# Patient Record
Sex: Male | Born: 1950
Health system: Southern US, Community
[De-identification: ages and names within clinical notes are randomized; demographics above are authoritative.]

## PROBLEM LIST (undated history)

## (undated) DIAGNOSIS — M51369 Other intervertebral disc degeneration, lumbar region without mention of lumbar back pain or lower extremity pain: Secondary | ICD-10-CM

## (undated) DIAGNOSIS — I4510 Unspecified right bundle-branch block: Secondary | ICD-10-CM

## (undated) DIAGNOSIS — I480 Paroxysmal atrial fibrillation: Secondary | ICD-10-CM

## (undated) DIAGNOSIS — I5040 Unspecified combined systolic (congestive) and diastolic (congestive) heart failure: Secondary | ICD-10-CM

## (undated) DIAGNOSIS — N529 Male erectile dysfunction, unspecified: Secondary | ICD-10-CM

## (undated) DIAGNOSIS — Z8673 Personal history of transient ischemic attack (TIA), and cerebral infarction without residual deficits: Secondary | ICD-10-CM

## (undated) DIAGNOSIS — E785 Hyperlipidemia, unspecified: Secondary | ICD-10-CM

## (undated) DIAGNOSIS — Z9889 Other specified postprocedural states: Secondary | ICD-10-CM

## (undated) DIAGNOSIS — I2510 Atherosclerotic heart disease of native coronary artery without angina pectoris: Secondary | ICD-10-CM

## (undated) DIAGNOSIS — I639 Cerebral infarction, unspecified: Secondary | ICD-10-CM

## (undated) DIAGNOSIS — Z7901 Long term (current) use of anticoagulants: Secondary | ICD-10-CM

## (undated) DIAGNOSIS — I495 Sick sinus syndrome: Secondary | ICD-10-CM

## (undated) DIAGNOSIS — I700 Atherosclerosis of aorta: Secondary | ICD-10-CM

## (undated) DIAGNOSIS — I629 Nontraumatic intracranial hemorrhage, unspecified: Secondary | ICD-10-CM

## (undated) DIAGNOSIS — R7303 Prediabetes: Secondary | ICD-10-CM

## (undated) DIAGNOSIS — N400 Enlarged prostate without lower urinary tract symptoms: Secondary | ICD-10-CM

## (undated) DIAGNOSIS — Z950 Presence of cardiac pacemaker: Secondary | ICD-10-CM

## (undated) DIAGNOSIS — G4733 Obstructive sleep apnea (adult) (pediatric): Secondary | ICD-10-CM

## (undated) DIAGNOSIS — C44621 Squamous cell carcinoma of skin of unspecified upper limb, including shoulder: Secondary | ICD-10-CM

## (undated) DIAGNOSIS — D696 Thrombocytopenia, unspecified: Secondary | ICD-10-CM

## (undated) DIAGNOSIS — R55 Syncope and collapse: Secondary | ICD-10-CM

## (undated) DIAGNOSIS — I10 Essential (primary) hypertension: Secondary | ICD-10-CM

## (undated) DIAGNOSIS — I519 Heart disease, unspecified: Secondary | ICD-10-CM

## (undated) DIAGNOSIS — M5030 Other cervical disc degeneration, unspecified cervical region: Secondary | ICD-10-CM

## (undated) DIAGNOSIS — A419 Sepsis, unspecified organism: Secondary | ICD-10-CM

## (undated) DIAGNOSIS — N183 Chronic kidney disease, stage 3 unspecified: Secondary | ICD-10-CM

## (undated) DIAGNOSIS — I509 Heart failure, unspecified: Secondary | ICD-10-CM

## (undated) DIAGNOSIS — I6789 Other cerebrovascular disease: Secondary | ICD-10-CM

## (undated) DIAGNOSIS — I7121 Aneurysm of the ascending aorta, without rupture: Secondary | ICD-10-CM

## (undated) DIAGNOSIS — M47819 Spondylosis without myelopathy or radiculopathy, site unspecified: Secondary | ICD-10-CM

## (undated) DIAGNOSIS — G5732 Lesion of lateral popliteal nerve, left lower limb: Secondary | ICD-10-CM

## (undated) DIAGNOSIS — I499 Cardiac arrhythmia, unspecified: Secondary | ICD-10-CM

## (undated) DIAGNOSIS — R931 Abnormal findings on diagnostic imaging of heart and coronary circulation: Secondary | ICD-10-CM

## (undated) DIAGNOSIS — I7143 Infrarenal abdominal aortic aneurysm, without rupture: Secondary | ICD-10-CM

## (undated) DIAGNOSIS — M1990 Unspecified osteoarthritis, unspecified site: Secondary | ICD-10-CM

## (undated) DIAGNOSIS — G459 Transient cerebral ischemic attack, unspecified: Secondary | ICD-10-CM

## (undated) DIAGNOSIS — I719 Aortic aneurysm of unspecified site, without rupture: Secondary | ICD-10-CM

## (undated) HISTORY — DX: Abnormal findings on diagnostic imaging of heart and coronary circulation: R93.1

## (undated) HISTORY — DX: Obstructive sleep apnea (adult) (pediatric): G47.33

## (undated) HISTORY — PX: LOOP RECORDER REMOVAL: EP1215

## (undated) HISTORY — PX: ELECTROCARDIOGRAM: SHX264

## (undated) HISTORY — DX: Paroxysmal atrial fibrillation: I48.0

## (undated) HISTORY — PX: ANTERIOR CERVICAL DECOMP/DISCECTOMY FUSION: SHX1161

## (undated) HISTORY — PX: WISDOM TOOTH EXTRACTION: SHX21

## (undated) HISTORY — DX: Sick sinus syndrome: I49.5

## (undated) HISTORY — DX: Syncope and collapse: R55

## (undated) HISTORY — DX: Transient cerebral ischemic attack, unspecified: G45.9

## (undated) HISTORY — DX: Hyperlipidemia, unspecified: E78.5

## (undated) HISTORY — PX: COLONOSCOPY: SHX174

## (undated) HISTORY — PX: NECK SURGERY: SHX720

---

## 2001-03-27 ENCOUNTER — Encounter: Payer: Self-pay | Admitting: Internal Medicine

## 2001-03-27 ENCOUNTER — Ambulatory Visit (HOSPITAL_COMMUNITY): Admission: RE | Admit: 2001-03-27 | Discharge: 2001-03-27 | Payer: Self-pay | Admitting: Internal Medicine

## 2001-07-16 ENCOUNTER — Observation Stay (HOSPITAL_COMMUNITY): Admission: RE | Admit: 2001-07-16 | Discharge: 2001-07-17 | Payer: Self-pay | Admitting: Neurosurgery

## 2001-08-13 ENCOUNTER — Encounter: Admission: RE | Admit: 2001-08-13 | Discharge: 2001-08-13 | Payer: Self-pay | Admitting: Neurosurgery

## 2001-11-12 ENCOUNTER — Encounter (HOSPITAL_COMMUNITY): Admission: RE | Admit: 2001-11-12 | Discharge: 2001-12-12 | Payer: Self-pay | Admitting: Neurosurgery

## 2001-12-13 ENCOUNTER — Encounter (HOSPITAL_COMMUNITY): Admission: RE | Admit: 2001-12-13 | Discharge: 2002-01-12 | Payer: Self-pay | Admitting: Neurosurgery

## 2003-10-08 ENCOUNTER — Emergency Department (HOSPITAL_COMMUNITY): Admission: EM | Admit: 2003-10-08 | Discharge: 2003-10-08 | Payer: Self-pay | Admitting: Internal Medicine

## 2005-02-17 ENCOUNTER — Ambulatory Visit (HOSPITAL_COMMUNITY): Admission: RE | Admit: 2005-02-17 | Discharge: 2005-02-17 | Payer: Self-pay | Admitting: Family Medicine

## 2005-02-22 ENCOUNTER — Ambulatory Visit: Payer: Self-pay | Admitting: Cardiology

## 2006-03-10 DIAGNOSIS — G459 Transient cerebral ischemic attack, unspecified: Secondary | ICD-10-CM

## 2006-03-10 HISTORY — DX: Transient cerebral ischemic attack, unspecified: G45.9

## 2006-03-27 ENCOUNTER — Ambulatory Visit (HOSPITAL_COMMUNITY): Admission: RE | Admit: 2006-03-27 | Discharge: 2006-03-27 | Payer: Self-pay | Admitting: General Surgery

## 2006-04-05 ENCOUNTER — Encounter: Payer: Self-pay | Admitting: Emergency Medicine

## 2006-04-05 ENCOUNTER — Ambulatory Visit: Payer: Self-pay | Admitting: Internal Medicine

## 2006-04-05 ENCOUNTER — Encounter (INDEPENDENT_AMBULATORY_CARE_PROVIDER_SITE_OTHER): Payer: Self-pay | Admitting: Neurology

## 2006-04-05 ENCOUNTER — Encounter: Payer: Self-pay | Admitting: Internal Medicine

## 2006-04-05 ENCOUNTER — Inpatient Hospital Stay (HOSPITAL_COMMUNITY): Admission: RE | Admit: 2006-04-05 | Discharge: 2006-04-07 | Payer: Self-pay | Admitting: Neurology

## 2009-03-18 ENCOUNTER — Emergency Department (HOSPITAL_COMMUNITY): Admission: EM | Admit: 2009-03-18 | Discharge: 2009-03-18 | Payer: Self-pay | Admitting: Emergency Medicine

## 2009-04-10 ENCOUNTER — Ambulatory Visit (HOSPITAL_COMMUNITY): Admission: RE | Admit: 2009-04-10 | Discharge: 2009-04-10 | Payer: Self-pay | Admitting: Family Medicine

## 2009-04-22 ENCOUNTER — Ambulatory Visit (HOSPITAL_COMMUNITY): Admission: RE | Admit: 2009-04-22 | Discharge: 2009-04-22 | Payer: Self-pay | Admitting: Family Medicine

## 2009-08-11 ENCOUNTER — Ambulatory Visit (HOSPITAL_COMMUNITY): Admission: RE | Admit: 2009-08-11 | Discharge: 2009-08-11 | Payer: Self-pay | Admitting: Pulmonary Disease

## 2009-11-19 ENCOUNTER — Ambulatory Visit (HOSPITAL_COMMUNITY): Admission: RE | Admit: 2009-11-19 | Discharge: 2009-11-19 | Payer: Self-pay | Admitting: Family Medicine

## 2010-07-16 ENCOUNTER — Ambulatory Visit (HOSPITAL_COMMUNITY): Admission: RE | Admit: 2010-07-16 | Discharge: 2010-07-16 | Payer: Self-pay | Admitting: Pulmonary Disease

## 2010-09-26 ENCOUNTER — Observation Stay (HOSPITAL_COMMUNITY): Admission: EM | Admit: 2010-09-26 | Discharge: 2010-09-27 | Payer: Self-pay | Source: Home / Self Care

## 2010-12-20 LAB — BASIC METABOLIC PANEL
BUN: 16 mg/dL (ref 6–23)
CO2: 30 mEq/L (ref 19–32)
CO2: 32 mEq/L (ref 19–32)
Calcium: 9.3 mg/dL (ref 8.4–10.5)
Calcium: 9.7 mg/dL (ref 8.4–10.5)
Chloride: 103 mEq/L (ref 96–112)
Chloride: 106 mEq/L (ref 96–112)
GFR calc Af Amer: >60 mL/min (ref >60)
GFR calc Af Amer: >60 mL/min (ref >60)
GFR calc non Af Amer: >60 mL/min (ref >60)
Glucose, Bld: 86 mg/dL (ref 70–99)
Glucose, Bld: 93 mg/dL (ref 70–99)
Potassium: 3.5 mEq/L (ref 3.5–5.1)
Potassium: 3.6 mEq/L (ref 3.5–5.1)
Sodium: 141 mEq/L (ref 135–145)

## 2010-12-20 LAB — CARDIAC PANEL(CRET KIN+CKTOT+MB+TROPI)
Relative Index: INVALID (ref 0.0–2.5)
Relative Index: INVALID (ref 0.0–2.5)
Total CK: 65 U/L (ref 7–232)
Troponin I: <0.01 ng/mL (ref 0.00–0.06)

## 2010-12-20 LAB — CBC
HCT: 42.4 % (ref 39.0–52.0)
Hemoglobin: 15 g/dL (ref 13.0–17.0)
MCH: 31.8 pg (ref 26.0–34.0)
MCHC: 35.4 g/dL (ref 30.0–36.0)
MCV: 90 fL (ref 78.0–100.0)
MCV: 90 fL (ref 78.0–100.0)
RBC: 4.71 MIL/uL (ref 4.22–5.81)
WBC: 8.1 10*3/uL (ref 4.0–10.5)

## 2010-12-20 LAB — DIFFERENTIAL
Basophils Absolute: 0 10*3/uL (ref 0.0–0.1)
Basophils Relative: 0 % (ref 0–1)
Basophils Relative: 0 % (ref 0–1)
Eosinophils Absolute: 0.1 10*3/uL (ref 0.0–0.7)
Eosinophils Absolute: 0.1 10*3/uL (ref 0.0–0.7)
Eosinophils Relative: 1 % (ref 0–5)
Lymphocytes Relative: 23 % (ref 12–46)
Lymphs Abs: 1.9 10*3/uL (ref 0.7–4.0)
Monocytes Absolute: 0.9 10*3/uL (ref 0.1–1.0)
Monocytes Absolute: 0.9 10*3/uL (ref 0.1–1.0)
Monocytes Relative: 10 % (ref 3–12)
Monocytes Relative: 11 % (ref 3–12)
Neutrophils Relative %: 64 % (ref 43–77)
Neutrophils Relative %: 66 % (ref 43–77)

## 2010-12-20 LAB — POCT CARDIAC MARKERS: Myoglobin, poc: 70.5 ng/mL (ref 12–200)

## 2010-12-20 LAB — CK TOTAL AND CKMB (NOT AT ARMC)
CK, MB: 2.1 ng/mL (ref 0.3–4.0)
Total CK: 79 U/L (ref 7–232)

## 2010-12-20 LAB — HEPATIC FUNCTION PANEL
AST: 25 U/L (ref 0–37)
Alkaline Phosphatase: 54 U/L (ref 39–117)
Indirect Bilirubin: 0.6 mg/dL (ref 0.3–0.9)
Total Protein: 7 g/dL (ref 6.0–8.3)

## 2010-12-20 LAB — TYPE AND SCREEN

## 2010-12-20 LAB — PROTIME-INR: Prothrombin Time: 22.8 seconds — ABNORMAL HIGH (ref 11.6–15.2)

## 2010-12-29 LAB — CREATININE, SERUM: Creatinine, Ser: 0.95 mg/dL (ref 0.4–1.5)

## 2011-01-17 LAB — URINALYSIS, ROUTINE W REFLEX MICROSCOPIC
Leukocytes, UA: NEGATIVE
Nitrite: NEGATIVE
Specific Gravity, Urine: 1.014 (ref 1.005–1.030)
Urobilinogen, UA: 2 mg/dL — ABNORMAL HIGH (ref 0.0–1.0)
pH: 6 (ref 5.0–8.0)

## 2011-01-17 LAB — CBC
HCT: 44.1 % (ref 39.0–52.0)
Hemoglobin: 15.2 g/dL (ref 13.0–17.0)
MCV: 93.8 fL (ref 78.0–100.0)
RDW: 12.3 % (ref 11.5–15.5)
WBC: 9 10*3/uL (ref 4.0–10.5)

## 2011-01-17 LAB — BASIC METABOLIC PANEL
BUN: 11 mg/dL (ref 6–23)
Chloride: 101 mEq/L (ref 96–112)
GFR calc non Af Amer: >60 mL/min (ref >60)
Glucose, Bld: 109 mg/dL — ABNORMAL HIGH (ref 70–99)
Potassium: 3.9 mEq/L (ref 3.5–5.1)
Sodium: 136 mEq/L (ref 135–145)

## 2011-01-17 LAB — DIFFERENTIAL
Eosinophils Absolute: 0 10*3/uL (ref 0.0–0.7)
Eosinophils Relative: 0 % (ref 0–5)
Lymphocytes Relative: 10 % — ABNORMAL LOW (ref 12–46)
Lymphs Abs: 0.9 10*3/uL (ref 0.7–4.0)
Monocytes Absolute: 1.2 10*3/uL — ABNORMAL HIGH (ref 0.1–1.0)

## 2011-01-17 LAB — URINE MICROSCOPIC-ADD ON

## 2011-01-17 LAB — URINE CULTURE: Culture: NO GROWTH

## 2011-02-02 ENCOUNTER — Ambulatory Visit (HOSPITAL_COMMUNITY)
Admission: RE | Admit: 2011-02-02 | Discharge: 2011-02-02 | Disposition: A | Discharge status: Home / Self Care | Payer: BC Managed Care – PPO | Source: Ambulatory Visit | Attending: Cardiology | Admitting: Cardiology

## 2011-02-02 DIAGNOSIS — I10 Essential (primary) hypertension: Secondary | ICD-10-CM | POA: Insufficient documentation

## 2011-02-02 DIAGNOSIS — R55 Syncope and collapse: Secondary | ICD-10-CM | POA: Insufficient documentation

## 2011-02-02 DIAGNOSIS — E785 Hyperlipidemia, unspecified: Secondary | ICD-10-CM | POA: Insufficient documentation

## 2011-02-02 HISTORY — PX: LOOP RECORDER IMPLANT: SHX5954

## 2011-02-12 NOTE — Consult Note (Signed)
Alex Maddox, Alex Maddox NO.:  [id number]  MEDICAL RECORD NO.:  [id number]          PATIENT TYPE:  INP  LOCATION:  A317                          FACILITY:  [facility]  PHYSICIAN:  Ritta Slot, MD     DATE OF BIRTH:  Dec 16, 1950  DATE OF CONSULTATION: DATE OF DISCHARGE:  09/27/2010                                CONSULTATION   Alex Maddox had a syncopal episode September 18, 2010, when he was at [employer], he had felt hot all over and then he passed out.  He also had episodes of palpitations intermittently.  Since December 10, he has had 4 episodes.  He had a CT scan and MRI of his brain that was normally. He subsequently underwent Exercise Nuclear Perfusion Stress Test in October 19, 2010 that showed ha has a low EF of 43%.  His event recorder from January did show evidence of what looks like short burst of AFib flutter with artifact, but is also concerned on deep inspiration of the pulses there is wide-complex tachycardia.  I am not convinced that this is VT, but it raises the suspicion of VT.  I do think that there was a lot of artifacts and it was difficult to ascertain significantly.  I do feel we need go ahead and proceed with loop recorder implantation.  Given his symptoms of dizziness, sweating, feeling hot with night sweats, I also do feel that we need to proceed with evaluation for 24-hour urine to rule out pheochromocytoma for metanephrines as well as TSH.  We will go ahead and set this up.  PAST MEDICAL HISTORY: 1. PAF. 2. Normal EF. 3. TIAs at the age of 56.  CURRENT MEDICATIONS: 1. Coumadin. 2. Coreg 12.5 mg b.i.d. 3. Diovan 160 mg daily. 4. Saw palmetto 450 mg daily. 5. Fish oil 1200 mg daily. 6. Simvastatin 40 mg at bedtime.  ALLERGIES:  NO KNOWN DRUG ALLERGIES.  SOCIAL HISTORY:  He just recently got married.  He has done well.  He does not drink alcohol.  He works as a Chartered certified accountant.  He does not smoke.  FAMILY HISTORY:  Very strong family history of  premature CAD.  REVIEW OF SYSTEMS:  He has had syncopal episodes more frequently.  PHYSICAL EXAMINATION:  GENERAL:  Well looking individual in acute distress. VITAL SIGNS:  He is 6 feet 1 inch, 192 pounds, blood pressure 132/78, heart rate 63 sinus rhythm. HEENT:  Head is atraumatic, normocephalic. NECK:  Supple.  Full range of movement.  No jugular venous distention, carotid bruits, or thyromegaly. NEUROLOGIC:  Cranial nerves II-XII normal.  PERRLA.  No focal deficits. MUSCULOSKELETAL:  Normal focal deficits. CARDIOVASCULAR:  No heaves or thrills.  Heart sounds 1 and 2 are heard. He had no murmurs, rubs, or gallops. LUNGS:  Clear to auscultation bilaterally.  Percussion at resonant throughout.  No crepitus or wheezing noted. EXTREMITIES:  2+ pedal pulses, no edema.  PLAN:  I am going to set him up for a loop recorder implantation this afternoon to evaluate for any malignant arrhythmias including VT.  I am worried that there is suspicion for this.  Although he does have a history of PAF, we need to figure out what exactly is causing his symptoms.  We will go ahead and set this up today.  We will also get  24- hour urine for catecholamines to exclude pheochromocytoma as well as a TSH.  We will set this all for him today for him.  I am forwarding to seeing him in a week's time for further evaluation.     Ritta Slot, MD     HS/MEDQ  D:  02/02/2011  T:  02/02/2011  Job:  [id number]  Electronically Signed by Ritta Slot MD on 02/12/2011 04:02:34 PM

## 2011-02-25 NOTE — Op Note (Signed)
[place]. [facility]  Patient:    Alex Maddox, Alex Maddox Visit Number: [id number] MRN: [id number]          Service Type: SUR Location: 3000 [id number] [id number] Attending Physician:  Cristi Loron Dictated by:   Cristi Loron, M.D. Proc. Date: 07/16/01 Admit Date:  07/16/2001 Discharge Date: 07/17/2001                             Operative Report  PREOPERATIVE DIAGNOSES:  C5-6 spondylosis and degenerative disk disease, spinal stenosis, cervicalgia, cervical radiculopathy.  POSTOPERATIVE DIAGNOSES:  C5-6 spondylosis and degenerative disk disease, spinal stenosis, cervicalgia, cervical radiculopathy.  PROCEDURES:  C5-6 extensive anterior cervical diskectomy, interbody iliac crest allograft arthrodesis, anterior cervical plating (Codman titanium plate and screws).  SURGEON:  Cristi Loron, M.D.  ASSISTANT:  Mena Goes. Franky Macho, M.D.  ANESTHESIA:  General endotracheal.  ESTIMATED BLOOD LOSS:  100 cc.  SPECIMENS:  None.  DRAINS:  None.  COMPLICATIONS:  None.  BRIEF HISTORY:  The patient is a 60 year old white male who has suffered from neck and arm pain.  He has failed medical management and worked up with a cervical MRI, which demonstrated spondylosis at C5-6.  He therefore weighed the risks, benefits, and alternatives of surgery and decided to proceed with a C5-6 anterior cervical diskectomy, fusion and plating.  DESCRIPTION OF PROCEDURE:  The patient was brought to the operating room by the anesthesia team.  General endotracheal anesthesia was induced.  The patient remained in supine position.  A roll was placed under his shoulders to place his neck in slight extension.  His anterior cervical region was then prepared with Betadine scrub and Betadine solution, and sterile drapes were applied.  I then injected the area to be incised with Marcaine with epinephrine solution.  I used a scalpel to make the transverse incision in the patients left anterior  neck.  I used the Metzenbaum scissors to divide the platysma muscle and then dissected medial to the sternocleidomastoid muscle, jugular vein, and carotid artery.  I bluntly dissected down toward the anterior cervical spine, carefully identifying the esophagus and then retracting it medially.  I cleared the soft tissue from the cervical spine using the Kitner swab.  I then inserted a bent spinal needle into the exposed interspace and obtained the intraoperative radiograph to confirm our location. I then used the 15 blade scalpel to incise the C5-6 intervertebral disk and performed a partial diskectomy at C5-6 using a pituitary forceps and a Karlin curette.  I then inserted distraction screws at C5 and C6, distracted the interspace, and then used the high-speed drill to decorticate the vertebral end plates at G3-8 and drill away the remainder of the C5-6 intervertebral disk.  There was a considerable spondylosis at this level.  I drilled away the bone spurs using the high-speed drill and then thinned out the posterior longitudinal ligament with the drill.  I incised the ligament with the arachnoid knife and then removed it with a Kerrison punch, undercutting the vertebral end plates at C5 and C6, decompressing the thecal sac.  I then performed a generous foraminotomy up the bilateral C6 nerve roots, removing some more bone spurs.  Having completed the decompression, I now turned my attention to the arthrodesis.  I obtained an iliac crest tricortical allograft bone graft and fashioned it to these approximate dimensions:  7 mm in height, 1 cm in depth. I inserted the bone graft in  the distracted interspace and then removed the distraction screw.  There was a good, snug fit of the bone graft.  I now turned my attention to the anterior spinal instrumentation.  I obtained the appropriate length Codman anterior cervical plate and laid it along C5 and C6, drilled two holes at C5, two at C6, and  tapped the holes and secured the plate to the vertebral bodies with two titanium screws at each level.  I then obtained the intraoperative radiograph that demonstrated good position of plate, screws, and interbody graft.  I secured the screws to the plate using the cam tightener at each screw.  I then achieved stringent hemostasis using bipolar electrocautery and Gelfoam.  I then removed the Caspar self-retaining retractor and inspected the esophagus for any damage.  There was none apparent.  I reapproximated the patients platysma muscle with interrupted 3-0 Vicryl suture, the subcutaneous tissue with interrupted 3-0 Vicryl suture, and the skin with Steri-Strips and benzoin.  The wound was then coated with bacitracin ointment and a sterile dressing applied.  The drapes were removed and the patient was subsequently extubated by the anesthesia team and transported to the postanesthesia care unit in stable condition.  All sponge, instrument, and needle counts were correct at the end of the case. Dictated by:   Cristi Loron, M.D. Attending Physician:  Tressie Stalker D DD:  07/16/01 TD:  07/17/01 Job: [id number]

## 2011-02-25 NOTE — H&P (Signed)
NAME:  Alex Maddox, Alex Maddox                     ACCOUNT NO.:  0   MEDICAL RECORD NO.:  0                  PATIENT TYPE:   LOCATION:                                 FACILITY:   PHYSICIAN:  Dalia Heading, M.D.  DATE OF BIRTH:  December 07, 1950   DATE OF ADMISSION:  03/27/2006  DATE OF DISCHARGE:  LH                                HISTORY & PHYSICAL   CHIEF COMPLAINT:  Need for screening colonoscopy.   HISTORY OF PRESENT ILLNESS:  The patient is a 60 year old, white male who is  referred for endoscopic evaluation.  He needs a colonoscopy for screening  purposes.  No abdominal pain, weight loss, nausea, vomiting, diarrhea,  constipation, melena, hematochezia have been noted.  He has never had a  colonoscopy.  No family history of colon carcinoma.   PAST MEDICAL HISTORY:  Hypertension.   PAST SURGICAL HISTORY:  Neck surgery.   CURRENT MEDICATIONS:  1.  Bisoprolol/hydrochlorothiazide one tablet p.o. daily.  2.  Vitamin C.  3.  Multivitamin.   ALLERGIES:  No known drug allergies.   REVIEW OF SYSTEMS:  Noncontributory.   PHYSICAL EXAMINATION:  GENERAL:  The patient is a well-developed, well-  nourished, white male in no acute distress.  LUNGS:  Clear to auscultation with equal breath sounds bilaterally.  HEART:  Heart examination reveals a regular rate and rhythm without S3, S4  or murmurs.  ABDOMEN:  The abdomen is soft, nontender, nondistended.  No  hepatosplenomegaly or masses are noted.  RECTAL:  Rectal examination was deferred to the procedure.   IMPRESSION:  Need for screening colonoscopy.   PLAN:  The patient is scheduled for colonoscopy on March 27, 2006.  Risks and  benefits of the procedure including bleeding and perforation were fully  explained to the patient and gave informed consent.      Dalia Heading, M.D.  Electronically Signed     MAJ/MEDQ  D:  03/09/2006  T:  03/09/2006  Job:  [id number]   cc:   Patrica Duel, M.D.  Fax: [phone]   Short Stay at [facility]

## 2011-02-25 NOTE — Discharge Summary (Signed)
Alex Maddox, Alex Maddox                 ACCOUNT NO.:  [id number]   MEDICAL RECORD NO.:  [id number]          PATIENT TYPE:  INP   LOCATION:  6707                         FACILITY:  [facility]   PHYSICIAN:  Pramod P. Pearlean Brownie, MD    DATE OF BIRTH:  1951/02/06   DATE OF ADMISSION:  04/05/2006  DATE OF DISCHARGE:  04/07/2006                                 DISCHARGE SUMMARY   DISCHARGE DIAGNOSES:  1.  Right hemispheric transient ischemic attack likely secondary to small      vessel disease.  2.  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  3.  Hypertension.  4.  Dyslipidemia.   MEDICATIONS AT TIME OF DISCHARGE:  Ziac 2.5 to 6.25 mg a day, Aggrenox 1 day  x14 days then increase to b.i.d. aspirin 81 mg a day x14 days then  discontinue, Tylenol tablets 30 minutes before Aggrenox dose x1 week only.   STUDIES PERFORMED:  1.  CT of the brain on admission, shows no acute abnormality.  2.  MRI of the brain shows no acute infarct, small infarcts right caudate      head and right cerebellum, mild nonspecific paraventricular white matter-      type changes.  3.  MRA of the neck shows no hemodynamically significant stenosis.  Left      vertebral artery is dominant.  Right vertebral artery and some PICA      distribution.  4.  MRA of the head with limited interpretation secondary to artifact,      likely mild atherosclerotic-type changes.  5.  EKG shows sinus bradycardia.  6.  Carotid Doppler shows no ICA stenosis.  7.  2-D echocardiogram has been performed, results are pending.  8.  Transcranial Doppler performed, results are pending.   LABORATORY DATA:  CBC normal differential, not done.  Coagulation studies  normal.  Chemistry is normal.  Liver function test normal.  Hemoglobin A1c  5.3, homocystine 7.6.  Cholesterol 160, triglycerides 125, HDL 28, LDL 107.   HISTORY OF PRESENT ILLNESS:  Alex Maddox is a 60 year old right-handed white  male who was in his usual state of  health and went to be the night prior to  admission without any difficulties.  He woke suddenly at 3 a.m. in the  morning with a hot burning sensation and no control of the left side of his  body.  He got up and took a walk to the bathroom, but considered himself  wobbly, and at one time actually had to crawl.  He went back to bed for a  couple of hours then got up at 5 a.m. at his usual time with his alarm, ate  breakfast, but was still having problems with left-sided weakness, so he  called his brother.  His brother felt his speech was not right, perhaps a  little slurred, and went over.  The patient had dressed and they decided to  go to the emergency room.  He was able to walk to the car, but when they  arrived to [facility] emergency room, he could  not balance or walk into  triage.  He was evaluated at [facility] and it was determined that he may  have a stroke or TIA and arrangements were made to transfer him to [facility].   HOSPITAL COURSE:  MRI was negative for acute infarct.  It was felt patient  had right brain TIA with symptoms, which resolved.  Of note, he does have a  history of old infarcts, per his MRI scan.  Patient was not on aspirin prior  to admission and was placed on Aggrenox during the hospitalization for  secondary stroke prevention.  He was found to have vascular risk factors of  hypertension, which has been present and dyslipidemia, which has also been  present but he has not taken medications.  He was instructed to follow a low-  cholesterol diet prior to admission, but he has not done this.  Patient was  evaluated by PT/OT and felt to have no therapy needs.  He has returned to  his neurologic baseline and is ready for discharge.  Will need emboli  monitoring but will study as an outpatient to followup for possible  etiology.   CONDITION ON DISCHARGE:  Patient alert and oriented x3.  Speech slightly  slurred, but this is his baseline.  He has no  aphasia.  His extraocular  movements are intact.  His visual fields are full and face is symmetric.  He  has no upper extremity weakness.  No lower extremity weakness.  His chest is  clear to auscultation.  His heart rate is regular.   DISCHARGE PLAN:  1.  Discharge home in care of family.  2.  Aggrenox for secondary stroke prevention.  3.  Low-cholesterol diet is a must.  If LDL is not less than 100, will need      to be on statin.  At this time, the patient does not want to take statin      if does not have to.  So, he does understand risks.  4.  Follow up with Dr. P__________ in [facility] within 1 month.  5.  Outpatient bubble study and emboli monitoring and Dr. Marlis Edelson office      scheduled for 04/25/2006 at 1 p.m.  6.  Followup with Dr. Pearlean Brownie in 2 months.      Alex Maddox, N.P.    ______________________________  Alex Maddox. Pearlean Brownie, MD    SB/MEDQ  D:  04/07/2006  T:  04/07/2006  Job:  [id number]   cc:   Pramod P. Pearlean Brownie, MD  Fax: [phone]   Dr. Daine Gip

## 2011-02-25 NOTE — H&P (Signed)
[place]. [facility]  Patient:    Alex Maddox, Alex Maddox Visit Number: [id number] MRN: [id number]          Service Type: SUR Location: 3000 3034 01 Attending Physician:  Cristi Loron Dictated by:   Cristi Loron, M.D. Admit Date:  07/16/2001                           History and Physical  CHIEF COMPLAINT:  Neck pain and right arm pain.  HISTORY OF PRESENT ILLNESS:  The patient is a 60 year old white male who has had neck pain for years.  He has been seen by chiropractors and his family physician and treated with medications and failed to improve.  He has been worked up with a cervical MRI that demonstrated significant spondylosis and neural foraminal stenosis at C5-6.  The patient complains of neck pain with radiation to his right shoulder and down his right arm.  PAST MEDICAL HISTORY:  Positive for benign prostatic hypertrophy, hypertension.  PAST SURGICAL HISTORY:  Extraction of wisdom teeth.  MEDICATIONS:  Bisoprolol/HCTZ 25 mg p.o. q.d., Bextra 20 mg p.o. q.d., vitamins, p.r.n. Toradol.  ALLERGIES:  No known drug allergies.  FAMILY HISTORY:  The patients mother is age 30, in poor health with hypertension and cerebrovascular accident.  The patients father died of a heart attack at age 48.  SOCIAL HISTORY:  The patient is divorced.  He has no children.  He lives in [place].  He denies tobacco and drug use.  He rarely drinks alcohol.  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.  REVIEW OF SYSTEMS:  Negative except as above.  PHYSICAL EXAMINATION:  VITAL SIGNS:  Height 6 feet, weight 184 pounds.  GENERAL:  A pleasant, well-nourished, well-developed 60 year old white male in no apparent distress.  HEENT:  Normal.  NECK:  Supple.  There are no masses, deformities, tracheal deviation, jugular venous distention, or carotid bruits.  He had a limited cervical range of motion.  Spurlings test is positive along the right, negative on the  left. Lhermittes sign was not present.  CHEST:  Thorax symmetric.  Lungs clear.  CARDIAC:  Regular rate and rhythm.  ABDOMEN:  Soft, nontender.  EXTREMITIES:  Normal.  BACK:  Normal.  NEUROLOGIC:  The patient is alert and oriented x 3.  Cranial nerves II-XII are grossly intact bilaterally.  Vision and hearing are grossly normal bilaterally.  He is wearing corrective lenses.  Motor strength is 5/5 at the lateral deltoid, biceps, triceps, hand grip, wrist extensor, interosseous, psoas, quadriceps, gastrocnemius, extensor hallucis longus.  Cerebellar exam is normal.  Sensory exam is normal.  Deep tendon reflexes are 1-2/4 in the bilateral biceps and triceps, 2/4 in bilateral quadriceps and gastrocnemius and left brachioradialis, and 1/4 in the right brachioradialis.  IMAGING STUDIES:  The patient had a cervical MRI performed at [facility] on March 27, 2001, which demonstrates significant spondylosis at C5-6 with bilateral neural foraminal stenosis.  ASSESSMENT AND PLAN:  C5-6 spondylosis, spinal stenosis, cervical myelopathy, cervicalgia, cervical radiculopathy.  I have discussed the situation with the patient and reviewed his MRI scan with him and pointed out the abnormalities. I have discussed the various treatment options, including doing nothing, continued medical management, and surgery.  I described the procedure of a C5-6 anterior cervical diskectomy, interbody iliac crest allograft arthrodesis with or without anterior cervical plating.  I have discussed the risks of surgery extensively.  The patient  has weighed the risks, benefits, and alternatives of surgery and wants to proceed with C5-6 anterior cervical diskectomy and fusion and plating on July 16, 2001. Dictated by:   Cristi Loron, M.D. Attending Physician:  Tressie Stalker D DD:  07/16/01 TD:  07/16/01 Job: [id number]

## 2011-02-25 NOTE — Consult Note (Signed)
NAMEGEOVANNI, Maddox NO.:  [id number]   MEDICAL RECORD NO.:  [id number]           PATIENT TYPE:   LOCATION:                                 FACILITY:   PHYSICIAN:  Catherine A. Orlin Hilding, M.D.  DATE OF BIRTH:   DATE OF CONSULTATION:  04/05/2006  DATE OF DISCHARGE:                                   CONSULTATION   CHIEF COMPLAINT:  Rule out stroke.   HISTORY OF PRESENT ILLNESS:  Alex Maddox is a 60 year old right-handed white  man who lives by himself.  Was in his usual state of health.  Went to bed  last night without any difficulties.  Woke suddenly at 3 in the morning with  a hot, burning sensation but now no control of the left side of his body.  He got up and took a walk to the bathroom, but considered himself wobbly and  at one time had to actually crawl.  Went back to bed for a couple of hours,  then got up at 5 at his usual time with his alarm, ate breakfast, but was  still having problems with left-sided difficulty so he called his brother.  His brother felt the speech was not quite right, perhaps a little slurred,  went over.  Patient had dressed and decided to take him to the emergency  room.  He was able to walk to the vehicle but when they arrived to [facility] ER, he could not balance or walk in to the triage.  His symptoms have  substantially subsided since that time, although he has not tried walking.  He was evaluated at [facility] and determined that he may have had a stroke  or TIA and arrangements were made with Dr. Anne Hahn during his call and it was  decided to transfer him to [facility].   REVIEW OF SYSTEMS:  Negative for chest pain, headache, shortness of breath,  nausea, vomiting, or dizziness, or fever.   PAST MEDICAL HISTORY:  1.  Significant for hypertension.  2.  Possibly hypercholesterolemia.  3.  Benign prostatic hypertrophy.  4.  No diabetes.  No coronary artery disease.  5.  He has had a cervical fusion anteriorly by Dr. Lovell Sheehan.   Primary care is Wellsite geologist at [place].   MEDICATIONS:  1.  Bisoprolol/hydrochlorothiazide 2.5/6.25 one a day.  2.  Multivitamin.  3.  Vitamin C.  4.  Saw palmetto supplements.   ALLERGIES:  No known drug allergies.   SOCIAL HISTORY:  He is divorced.  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  No cigarette or alcohol use.  No recreational drug use.   FAMILY HISTORY:  Positive for MIs and strokes.   PHYSICAL EXAMINATION:  VITAL SIGNS:  Temperature is 98.9, pulse is 99,  respirations 20, blood pressure is 157/91, 100% saturation on 2 L oxygen.  HEENT:  Head is normocephalic, atraumatic.  NECK:  Supple without bruits.  HEART:  Regular rate and rhythm.  LUNGS:  Clear to auscultation.  NEUROLOGIC:  His gait is normal.  On the NIH stroke scale is scores  a 1 only  for some minimal drift of the left lower extremity.  Item 1a he is alert,  gets 0.  1b answers two questions correctly, gets 0.  Item 1c follows two  out of two commands correctly, gets a 0.  Item 2 has normal full gaze, gets  a 0.  Item 3 full visual fields, gets a 0.  Item 4 no facial weakness, gets  a 0.  Item 5a no drift in the left upper extremity.  Item 5b no drift in the  right upper extremity.  Item 6a a very slight drift in the left lower  extremity.  Item 6b no drift in the right lower extremity.  Item 7 no  ataxia, 0.  So we got a 1 for the 6a left leg drift.  Item 8 sensory.  No  sensory loss, gets a 0.  Item 9 no aphasia, gets a 0.  Item 10 no  dysarthria, gets a 0.  Item 11 no extinction, gets a 0.  Total score of 1  for the slight left lower extremity drift.  I did ambulate him and he walked  normally without any ataxia or staggering or weakness.   CT of the brain is unremarkable for any acute abnormalities, no blood.  Laboratories are fairly unremarkable except for a potassium of 2.8 and  glucose of 194.  Cardiac enzymes are negative.   IMPRESSION:  Transient left-sided weakness and clumsiness,  possibly ataxia,  now cleared except for minimal left lower extremity drift, possible right  brain stroke.   RECOMMENDATIONS:  Admit for stroke work-up to include MRI of the brain, MRA  of the neck and head, 2-D echocardiogram, carotid Doppler, transcranial  Doppler, homocysteine and cholesterol levels.  He is not a TPA candidate  secondary to time lapse since the onset which was some time when he was last  seen normal was about 10:30 last night.  He is not a study candidate  secondary to the time lapse and the minimal deficit of 1.  Need to check a  swallow.      Catherine A. Orlin Hilding, M.D.  Electronically Signed     CAW/MEDQ  D:  04/05/2006  T:  04/05/2006  Job:  [id number]

## 2011-02-25 NOTE — Discharge Summary (Signed)
NAMECHASTIN, RIESGO                 ACCOUNT NO.:  [id number]   MEDICAL RECORD NO.:  [id number]          PATIENT TYPE:  INP   LOCATION:  6707                         FACILITY:  [facility]   PHYSICIAN:  Pramod P. Pearlean Brownie, MD    DATE OF BIRTH:  1951-07-07   DATE OF ADMISSION:  04/05/2006  DATE OF DISCHARGE:  04/07/2006                                 DISCHARGE SUMMARY   DIAGNOSES AT TIME OF DISCHARGE:  1.  Right hemisphere transient ischemic attack secondary to small vessel      disease.  2.  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  3.  Hypertension.  4.  Dyslipidemia.   MEDICINES AT TIME OF DISCHARGE:  1.  Ziac 2.5 to 6.25 mg a day.  2.  Aggrenox 1 a day x14 days and increase to b.i.d.  3.  Aspirin 81 mg a day x14 days then discontinue.  4.  Tylenol 2 tablets 30 minutes before Aggrenox dose x1 week only.   STUDIES PERFORMED:  1.  CT of the brain on admission showed no acute abnormality.  2.  MRI of the brain showed no acute infarct, old small infarcts, right      caudate head and right cerebellum mild nonspecific paraventricular white      matter type changes.  3.  MRA of the neck shows no hemodynamically significant stenosis.  Left      vetebral artery is dominant.  Right vetebral artery ends on PICA.  4.  MRA of the head artifacts include evaluation, may have arthrosclerotic      type changes.  No aneurysms seen.  5.  EKG shows sinus bradycardia.  6.  Carotid Doppler shows no ICA stenosis.  7.  A 2D echocardiogram has been completed; results are pending.  8.  Transcranial Doppler has been completed; results are pending.   LABORATORY STUDIES:  CBC normal.  Differential not completed.  Chemistry  normal.  Liver function test normal.  Coagulation studies normal.  Hemoglobin A1c 5.3.  Cholesterol 160, triglycerides 125, HDL 28, LDL 107 and  homocystine 7.6.   HISTORY OF PRESENT ILLNESS:  [name] is a 60 year old right handed white  male.  He was in his usual state of health.  He went to bed the night prior  to admission without any difficulties.  He woke suddenly at 3 a.m. in the  morning with hot burning sensation in his left side of his body.  He got up  and took a walk to the bathroom, but considered himself wobbly and at one  time actually had to crawl.  He went back to bed for a couple of hours and  then got up at 5 a.m. at his usual time with his alarm.  He ate breakfast  but was still having problems with left sided weakness, so he called his  brother.  His brother felt his speech was not quite right perhaps a little  slurred.  He went over and they decided to go to the emergency room.  He  was  able to walk to the vehicle, but when they arrived at [facility] he could  not balance or walk into triage.  He was evaluated and determined that he  may have had a stroke or TIA, and arrangements were made by Dr. Anne Hahn to  transfer him to [facility].   HOSPITAL COURSE:  MRI at [facility] showed no acute stroke but did have  several old strokes of unknown age which were obviously asymptomatic.  The  patient was placed on Aggrenox for secondary stroke prevention.  He also has  vascular risk factors of hypertension which is controlled, and dyslipidemia  for which medication had been recommended in the past but he had refused.  He continues to refuse, stating he has never changed his diet and really  would like to try that avenue first.  Agree for low cholesterol low fat diet  and reevaluation.  Statin will decrease future stroke risk if and when he is  agreeable.  He was evaluated by physical occupational therapy and found to  have no therapy needs and was stable for discharge home.   CONDITION ON DISCHARGE:  Good.  Patient alert and oriented x3.  Speech  clear.  No aphasia.  Face symmetric.  Extraocular movements intact.  Visual  fields full.  Chest clear to auscultation.  Heart rate regular.  Strength   normal.  Gait steady.   DISCHARGE PLAN:  1.  Discharge home.  2.  Aggrenox for secondary stroke prevention.  3.  Low cholesterol diet.  4.  Goal:  LDL is less than 100.  It remains elevated.  Strongly recommended      low dose statin.   FOLLOWUP:  1.  With Dr. Nobie Putnam or group in [place] which he refers to as the      __________.  2.  With Dr. Delia Heady in 2 months.  3.  Outpatient bubble study and emboli monitoring for possible embolic      source April 25, 2006 at 1 p.m. at Dr. Marlis Edelson office.      Annie Main, N.P.    ______________________________  Sunny Schlein. Pearlean Brownie, MD    SB/MEDQ  D:  04/07/2006  T:  04/07/2006  Job:  [id number]   cc:   Patrica Duel, M.D.  Fax: [phone]

## 2011-05-11 HISTORY — PX: INSERT / REPLACE / REMOVE PACEMAKER: SUR710

## 2011-05-30 ENCOUNTER — Ambulatory Visit (HOSPITAL_COMMUNITY): Payer: BC Managed Care – PPO

## 2011-05-30 ENCOUNTER — Ambulatory Visit (HOSPITAL_COMMUNITY)
Admission: RE | Admit: 2011-05-30 | Discharge: 2011-05-31 | Disposition: A | Discharge status: Home / Self Care | Payer: BC Managed Care – PPO | Source: Ambulatory Visit | Attending: Cardiovascular Disease | Admitting: Cardiovascular Disease

## 2011-05-30 DIAGNOSIS — Z8673 Personal history of transient ischemic attack (TIA), and cerebral infarction without residual deficits: Secondary | ICD-10-CM | POA: Insufficient documentation

## 2011-05-30 DIAGNOSIS — I10 Essential (primary) hypertension: Secondary | ICD-10-CM | POA: Insufficient documentation

## 2011-05-30 DIAGNOSIS — E785 Hyperlipidemia, unspecified: Secondary | ICD-10-CM | POA: Insufficient documentation

## 2011-05-30 DIAGNOSIS — Z7901 Long term (current) use of anticoagulants: Secondary | ICD-10-CM | POA: Insufficient documentation

## 2011-05-30 DIAGNOSIS — I495 Sick sinus syndrome: Secondary | ICD-10-CM | POA: Insufficient documentation

## 2011-05-30 DIAGNOSIS — I422 Other hypertrophic cardiomyopathy: Secondary | ICD-10-CM | POA: Insufficient documentation

## 2011-05-30 DIAGNOSIS — I4891 Unspecified atrial fibrillation: Secondary | ICD-10-CM | POA: Insufficient documentation

## 2011-05-30 HISTORY — PX: PACEMAKER INSERTION: SHX728

## 2011-05-30 LAB — SURGICAL PCR SCREEN: Staphylococcus aureus: NEGATIVE

## 2011-05-31 ENCOUNTER — Ambulatory Visit (HOSPITAL_COMMUNITY): Payer: BC Managed Care – PPO

## 2011-05-31 DIAGNOSIS — Z950 Presence of cardiac pacemaker: Secondary | ICD-10-CM

## 2011-05-31 HISTORY — DX: Presence of cardiac pacemaker: Z95.0

## 2011-06-08 NOTE — Discharge Summary (Signed)
Alex Maddox, Alex Maddox NO.:  [id number]  MEDICAL RECORD NO.:  [id number]  LOCATION:  2014                         FACILITY:  [facility]  PHYSICIAN:  Thurmon Fair, MD     DATE OF BIRTH:  1951/05/29  DATE OF ADMISSION:  05/30/2011 DATE OF DISCHARGE:  05/31/2011                              DISCHARGE SUMMARY   DISCHARGE DIAGNOSES: 1. Sinus node dysfunction with symptomatic sinus pauses and recurrent     syncope.     a.     Placement of a Medtronic permanent transvenous pacemaker,      serial number [id number].     b.     Explantation of loop recorder. 2. Paroxysmal atrial fibrillation, on Coumadin and requiring beta-     blocker therapy to prevent rapid ventricular response with the     patient's atrial fibrillation. 3. History of transient ischemic attack secondary to paroxysmal atrial     fibrillation. 4. Hypertrophic cardiomyopathy without congestive heart failure. 5. Mildly depressed left ventricular function. 6. Hypertension. 7. Dyslipidemia. 8. Anticoagulation.  DISCHARGE CONDITION:  Stable and improved.  PROCEDURES:  May 30, 2011, placement of a dual-chamber permanent pacemaker, right subclavian.  Implantable loop recorder removed from the left subclavian with abandoned attempt at pacemaker in the left.  DISCHARGE INSTRUCTIONS: 1. Follow permanent pacemaker instruction orders. 2. No driving until cleared by Dr. Royann Shivers. 3. No work until cleared by Dr. Royann Shivers. 4. Low-sodium heart-healthy diet. 5. Wound care as per instruction sheet. 6. Follow [place] Dr. [place]ann Sh[place] June 06[place] 2011 at 9:30 a.m. 7. Anticoagulation with Coumadin will be planned for June 06, 2011     as well with that appointment.  DISCHARGE MEDICATIONS:  See medication reconciliation sheet.  Continue the same medications and he will resume the Coumadin tonight.  On May 31, 2011, we did add Vicodin as a p.r.n. medication due to removing  the loop recorder and adding the permanent pacemaker at a different site.  HISTORY OF PRESENT ILLNESS:  This is a 60 year old gentleman with recurrent syncope and remote history of TIA 6 years ago.  He had an implantable loop recorder with multiple arrhythmias and several episodes of atrial fib, none associated with syncope.  The patient does have presyncopal complaints associated with episodes of sinus pauses of about 3 seconds.  The pause occurs in the background of multi-sinus bradycardia.  With these findings, he has significant conduction system disease with sinus node dysfunction, paroxysmal AFib, and tachybrady syndrome.  He does need to beta-blockers to prevent his AFib from been tachycardic as well as left ventricular hypertrophy, hypertrophic cardiomyopathy, and needs for permanent pacemaker.  This was arranged as an elective outpatient procedure.  The patient presented to [facility] on May 30, 2011 and underwent the procedure.  A Medtronic pacemaker was placed, please see the operative report.  The patient tolerated the procedure well and by May 31, 2011 he was stable.  All 3 wounds looked healthy.  Chest x-ray was without pneumothorax and the device check was excellent.  PLAN:  He is being discharged on May 31, 2011, resume Coumadin on May 31, 2011, and  follow up in [place].  Pro-time prior to the procedure was 21.6 and INR was 1.84 and the surgical screen was negative for MRSA or staph aureus.  Chest x-ray prior to the procedure, no acute chest process and postprocedure revealed interval placement of right subclavian dual lead pacemaker with overlying skin staples and removal of the left chest loop recorder and no pneumothorax.  The patient was seen by Dr. Royann Shivers on the morning of discharge and will follow up with him as an outpatient.     Alex Maddox. Annie Paras, N.P.   ______________________________ Thurmon Fair, MD    LRI/MEDQ  D:  05/31/2011  T:   05/31/2011  Job:  [id number]  cc:   Kirk Ruths, M.D.  Electronically Signed by Nada Boozer N.P. on 06/03/2011 04:12:22 PM Electronically Signed by Thurmon Fair M.D. on 06/08/2011 04:57:05 PM

## 2011-06-08 NOTE — Op Note (Signed)
NAMECALYB, MCQUARRIE                 ACCOUNT NO.:  [id number]  MEDICAL RECORD NO.:  [id number]  LOCATION:                                 FACILITY:  PHYSICIAN:  Thurmon Fair, MD     DATE OF BIRTH:  Sep 22, 1951  DATE OF PROCEDURE: DATE OF DISCHARGE:                              OPERATIVE REPORT   REASON FOR PROCEDURE: 1. Sinus node dysfunction with symptomatic bradycardia and sinus     pauses. 2. Paroxysmal atrial fibrillation with bradycardia induced by SSRI     medications. 3. Tachycardia-bradycardia syndrome with post-arrhythmic sinus arrest     of up to 3 seconds.  PROCEDURES PERFORMED: 1. Implantation of new dual-chamber permanent pacemaker via the right     subclavian approach. 2. Attempted and aborted attempt at implanting a left-sided system. 3. Bilateral upper extremity venography. 4. Removal of implantable loop recorder. 5. Moderate sedation.  MEDICATIONS ADMINISTERED:  Ancef 2 g intravenously, lidocaine 1% 50 mL locally, Versed 6 mg intravenously, fentanyl 125 mcg intravenously, Optiray 50 mL via peripheral upper extremity veins.  ESTIMATED BLOOD LOSS:  20 mL.  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  Mr. Tuazon is an otherwise healthy 60 year old gentleman with syncope. Implantable loop recorder has demonstrated evidence of tachycardia- bradycardia syndrome with episodes of paroxysmal atrial fibrillation, right ventricular response, the termination of which has been associated with sinus arrest.  To our knowledge, he has never had any central venous catheters, surgical procedures in the left subclavian vein, and has no history of left clavicle fracture or injury.  After risks and benefits of the procedure were described, the patient provided informed consent and was brought to the Cardiac Cath Lab in the fasting state, prepped and draped in the usual sterile fashion.  Local anesthesia with  1% lidocaine was administered to the left infraclavicular area and a 6-cm horizontal incision was made parallel to the inferior border of the left clavicle roughly 2-3 cm caudal to it.  Using electrocautery and blunt dissection, a prepectoral pocket was created down to the level of the pectoralis major muscular fascia.  Subsequently, an attempt was made to access the left subclavian vein. Repeated attempts were unsuccessful.  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  The left cephalic vein appeared to connect via a very large collateral directly to the left internal jugular vein, which then reconstituted the trajectory of the brachiocephalic vein.  Even with the information from venography, it was impossible to access the central venous system.  The pocket was then closed with two layers of 2-0 Vicryl and one layer of cutaneous staples after which a sterile dressing was applied.  The previously placed implantable loop recorder was removed after local anesthesia with 1% lidocaine was performed.  A 2-cm horizontal incision was created and the loop recorder was removed without difficulty.  The pocket was closed with two layers of 2-0 Vicryl and two cutaneous staples.  Subsequently, the procedure was interrupted.  The drape was removed and the patient's chest was reprepped for a right subclavian pacemaker approach.  The right upper extremity  venogram had been performed that showed a large and widely patent right subclavian vein.  The patient was reprepped and draped in a sterile fashion.  Local anesthesia was administered to the right infraclavicular area.  A 6-cm horizontal incision was made parallel to the inferior border of the right clavicle after which a prepectoral pocket was created in the similar fashion to the one on the left side.  Using fluoroscopic guidance in the modified Seldinger technique, a J-tipped guidewire  was placed in the right subclavian vein.  This was exchanged for an 8-French sheath, which subsequently was exchanged for two J-tipped guidewires each of which was then used to place an 8-French sheath (single venipuncture technique).  An antibiotic-soaked sponge had been placed in the prepectoral pocket.  Under fluoroscopic guidance, the ventricular lead was advanced to the level of the mid to apical right ventricular septum.  The active fixation helix was deployed.  There was very prominent current of injury.  Excellent sensing and pacing thresholds were encountered. There was no evidence of diaphragmatic/phrenic nerve stimulation at maximum device output.  The SafeSheath was peeled away and the lead was secured in place using 2-0 silk.  In a similar fashion, the right atrial lead was advanced to the level of the right anterolateral atrial free wall.  The active fixation helix was deployed.  There was moderate current of injury.  There was no evidence of diaphragmatic/phrenic nerve stimulation at maximum device output. Satisfactory sensing, pacing, and impedance parameters were recorded. The SafeSheath was peeled away and the lead was secured in place using 2- 0 silk.  The antibiotic-soaked sponge was removed from the pocket and pocket was soaked with the pocket was flushed with copious amounts of antibiotic solution.  The device was then connected to the leads with appropriate ventricular pacing and subsequently AV sequential pacing noted.  The generator was placed in the pocket with great care being taken that the leads be located deep to the generator.  The pocket was then closed in layers using two layers of 2-0 silk and one of cutaneous staples after which a sterile dressing was applied.  No other complications occurred.  DEVICE DETAILS:  Generator is a Medtronic RVDR01 (Revo MRI Safe), serial number [id number].  The atrial lead is a Medtronic 5086 MRI - 45 cm, serial  number [id number].  The ventricular lead is a Medtronic 5086 MRI - 52 cm, serial number [id number].  At the end of procedure, the following electronic parameters were encountered.  Atrial lead P-waves 5.6 mV, threshold 0.8 volts at 0.5 milliseconds pulse width, impedance 860 ohms.  Ventricular lead sensed R-waves 19 mV, threshold 0.75 volts at 0.4 milliseconds pulse width, impedance 1254 ohms.  The device was programed NVPR with a lower rate of 16 upper tracking rate and sensor limited rate of 130 beats per minute.  The initial sensed AV delay was 150 milliseconds/paced AV delay of 180 milliseconds.     Thurmon Fair, MD     MC/MEDQ  D:  05/30/2011  T:  05/31/2011  Job:  [id number]  cc:   [facility]  Electronically Signed by Thurmon Fair M.D. on 06/08/2011 04:57:02 PM

## 2011-09-30 ENCOUNTER — Other Ambulatory Visit (HOSPITAL_COMMUNITY): Payer: Self-pay | Admitting: Pulmonary Disease

## 2011-09-30 DIAGNOSIS — R911 Solitary pulmonary nodule: Secondary | ICD-10-CM

## 2011-10-05 ENCOUNTER — Ambulatory Visit (HOSPITAL_COMMUNITY)
Admission: RE | Admit: 2011-10-05 | Discharge: 2011-10-05 | Disposition: A | Discharge status: Home / Self Care | Payer: BC Managed Care – PPO | Source: Ambulatory Visit | Attending: Pulmonary Disease | Admitting: Pulmonary Disease

## 2011-10-05 DIAGNOSIS — J984 Other disorders of lung: Secondary | ICD-10-CM | POA: Insufficient documentation

## 2011-10-05 DIAGNOSIS — R911 Solitary pulmonary nodule: Secondary | ICD-10-CM

## 2011-10-05 DIAGNOSIS — Z09 Encounter for follow-up examination after completed treatment for conditions other than malignant neoplasm: Secondary | ICD-10-CM | POA: Insufficient documentation

## 2012-11-04 ENCOUNTER — Encounter (HOSPITAL_COMMUNITY): Payer: Self-pay

## 2012-11-04 ENCOUNTER — Emergency Department (HOSPITAL_COMMUNITY): Payer: BC Managed Care – PPO

## 2012-11-04 ENCOUNTER — Emergency Department (HOSPITAL_COMMUNITY)
Admission: EM | Admit: 2012-11-04 | Discharge: 2012-11-04 | Disposition: A | Discharge status: Home / Self Care | Payer: BC Managed Care – PPO | Attending: Emergency Medicine | Admitting: Emergency Medicine

## 2012-11-04 DIAGNOSIS — R55 Syncope and collapse: Secondary | ICD-10-CM | POA: Insufficient documentation

## 2012-11-04 DIAGNOSIS — Z8673 Personal history of transient ischemic attack (TIA), and cerebral infarction without residual deficits: Secondary | ICD-10-CM | POA: Insufficient documentation

## 2012-11-04 DIAGNOSIS — Z950 Presence of cardiac pacemaker: Secondary | ICD-10-CM | POA: Insufficient documentation

## 2012-11-04 DIAGNOSIS — I10 Essential (primary) hypertension: Secondary | ICD-10-CM | POA: Insufficient documentation

## 2012-11-04 HISTORY — DX: Essential (primary) hypertension: I10

## 2012-11-04 HISTORY — DX: Cerebral infarction, unspecified: I63.9

## 2012-11-04 LAB — CBC WITH DIFFERENTIAL/PLATELET
Eosinophils Absolute: 0.1 10*3/uL (ref 0.0–0.7)
Eosinophils Relative: 1 % (ref 0–5)
Hemoglobin: 15.7 g/dL (ref 13.0–17.0)
Lymphs Abs: 1.1 10*3/uL (ref 0.7–4.0)
MCH: 31.9 pg (ref 26.0–34.0)
MCHC: 34.9 g/dL (ref 30.0–36.0)
MCV: 91.5 fL (ref 78.0–100.0)
Monocytes Relative: 9 % (ref 3–12)
RBC: 4.92 MIL/uL (ref 4.22–5.81)

## 2012-11-04 LAB — BASIC METABOLIC PANEL
BUN: 16 mg/dL (ref 6–23)
Calcium: 10.1 mg/dL (ref 8.4–10.5)
Creatinine, Ser: 1.03 mg/dL (ref 0.50–1.35)
GFR calc non Af Amer: 77 mL/min — ABNORMAL LOW (ref >90)
Glucose, Bld: 106 mg/dL — ABNORMAL HIGH (ref 70–99)

## 2012-11-04 NOTE — ED Provider Notes (Signed)
History    CSN: [id number] Arrival date & time 11/04/12  1207 First MD Initiated Contact with Patient 11/04/12 1326      Chief Complaint  Patient presents with  . Loss of Consciousness    Patient is a 62 y.o. male presenting with syncope. The history is provided by the patient.  Loss of Consciousness This is a new problem. The current episode started less than 1 hour ago. The problem has been resolved. Pertinent negatives include no chest pain, no abdominal pain, no headaches and no shortness of breath. Associated symptoms comments: Pt started feeling hot.  This occurred while singing in church.  It lasted a few minutes then he passed out.. Nothing aggravates the symptoms.  He was unresponsive for a bout 30 seconds.  Family was unable to feel a pulse.    He has history of syncopal episodes but that was prior to a pacemaker placement 18 months ago.  This is the first episode since the pacemaker was placed.  medtronic rvd ro1  Past Medical History  Diagnosis Date  . Hypertension   . Stroke     Past Surgical History  Procedure Date  . Pacemaker insertion     History reviewed. No pertinent family history.  History  Substance Use Topics  . Smoking status: Never Smoker   . Smokeless tobacco: Not on file  . Alcohol Use: No      Review of Systems  Respiratory: Negative for shortness of breath.   Cardiovascular: Positive for syncope. Negative for chest pain.  Gastrointestinal: Negative for abdominal pain.  Neurological: Negative for headaches.  All other systems reviewed and are negative.    Allergies  Review of patient's allergies indicates no known allergies.  Home Medications  No current outpatient prescriptions on file.  BP 136/90  Pulse 67  Temp 98.2 F (36.8 C) (Oral)  Resp 20  Ht 6' (1.829 m)  Wt 195 lb (88.451 kg)  BMI 26.45 kg/m2  SpO2 100%  Physical Exam  Nursing note and vitals reviewed. Constitutional: He appears well-developed and well-nourished. No  distress.  HENT:  Head: Normocephalic and atraumatic.  Right Ear: External ear normal.  Left Ear: External ear normal.  Eyes: Conjunctivae normal are normal. Right eye exhibits no discharge. Left eye exhibits no discharge. No scleral icterus.  Neck: Neck supple. No tracheal deviation present.  Cardiovascular: Normal rate, regular rhythm and intact distal pulses.   Pulmonary/Chest: Effort normal and breath sounds normal. No stridor. No respiratory distress. He has no wheezes. He has no rales.  Abdominal: Soft. Bowel sounds are normal. He exhibits no distension. There is no tenderness. There is no rebound and no guarding.  Musculoskeletal: He exhibits no edema and no tenderness.  Neurological: He is alert. He has normal strength. No sensory deficit. Cranial nerve deficit:  no gross defecits noted. He exhibits normal muscle tone. He displays no seizure activity. Coordination normal.  Skin: Skin is warm and dry. No rash noted.  Psychiatric: He has a normal mood and affect.    ED Course  Procedures (including critical care time) Normal sinus rhythm, 65 Normal ECG When compared with ECG of 31-May-2011 04:29, Nonspecific T wave abnormality, improved in Inferior leads Nonspecific T wave abnormality no longer evident in Lateral leads Tele monitor: atrial paced rate 60   Pacemaker interrogation: No device or bleed performance issues observed Labs Reviewed  CBC WITH DIFFERENTIAL - Abnormal; Notable for the following:    Platelets 125 (*)     Neutrophils Relative  79 (*)     Lymphocytes Relative 11 (*)     All other components within normal limits  BASIC METABOLIC PANEL - Abnormal; Notable for the following:    Potassium 3.3 (*)     Glucose, Bld 106 (*)     GFR calc non Af Amer 77 (*)     GFR calc Af Amer 89 (*)     All other components within normal limits  PROTIME-INR - Abnormal; Notable for the following:    Prothrombin Time 16.8 (*)     All other components within normal limits    Ct Head Wo Contrast  11/04/2012  *RADIOLOGY REPORT*  Clinical Data: Syncope  CT HEAD WITHOUT CONTRAST  Technique:  Contiguous axial images were obtained from the base of the skull through the vertex without contrast.  Comparison: 09/26/2010  Findings: No skull fracture is noted.  Atherosclerotic calcifications of carotid siphon are noted.  No intracranial hemorrhage, mass effect or midline shift.  No acute infarction.  No mass lesion is noted on this unenhanced scan.  Stable small lacunar infarct right caudate nucleus.  Stable periventricular chronic white matter disease.  IMPRESSION: No acute intracranial abnormality.  No significant change.   Original Report Authenticated By: Natasha Mead, M.D.     1. Syncope      MDM  Patient feels fine at this time and denies any complaints. He has no focal neurologic abnormalities to suggest acute stroke. The syncopal event is not suggestive of TIA.  Patient's pacemaker was interrogated and there is no evidence of any active issues. He did not have any bradycardic or tachycardic events per the pacemaker representative.  Discussed admission to the hospital the patient for further evaluation. He would prefer to go home and followup with his cardiologist closely.  Considering the reassuring evaluation the emergency room, and the fact that he is asymptomatic at this time I feel it is reasonable. The patient understands to return for any worsening symptoms       Celene Kras, MD 11/04/12 1526

## 2012-11-04 NOTE — ED Notes (Signed)
Pt via EMS with complaints of syncopal episode. Pt at church became hot and passed out. Bystanders advised pt unresponsive for about 30 seconds. Denies pain or SOB.

## 2012-11-04 NOTE — ED Notes (Signed)
Pacemaker interrogated. 

## 2012-11-04 NOTE — ED Notes (Signed)
Patient back from CT.

## 2012-11-13 ENCOUNTER — Encounter: Payer: Self-pay | Admitting: Cardiovascular Disease

## 2012-11-13 ENCOUNTER — Encounter: Payer: Self-pay | Admitting: *Deleted

## 2012-12-14 ENCOUNTER — Ambulatory Visit: Payer: Self-pay | Admitting: Cardiovascular Disease

## 2012-12-14 DIAGNOSIS — Z7901 Long term (current) use of anticoagulants: Secondary | ICD-10-CM

## 2012-12-14 DIAGNOSIS — Z5181 Encounter for therapeutic drug level monitoring: Secondary | ICD-10-CM | POA: Insufficient documentation

## 2012-12-14 DIAGNOSIS — I4891 Unspecified atrial fibrillation: Secondary | ICD-10-CM

## 2013-03-13 ENCOUNTER — Ambulatory Visit: Payer: BC Managed Care – PPO

## 2013-03-25 ENCOUNTER — Telehealth: Payer: Self-pay | Admitting: Cardiovascular Disease

## 2013-03-25 DIAGNOSIS — Z7901 Long term (current) use of anticoagulants: Secondary | ICD-10-CM

## 2013-03-25 DIAGNOSIS — I4891 Unspecified atrial fibrillation: Secondary | ICD-10-CM

## 2013-03-25 NOTE — Telephone Encounter (Signed)
LMOM to call back

## 2013-03-25 NOTE — Telephone Encounter (Signed)
He does not want to change his records to anywhere else. Just want to have coumadin levels check there

## 2013-03-25 NOTE — Telephone Encounter (Signed)
Message forwarded to K. Alvstad, PharmD.  Last OV note in inbox.

## 2013-03-25 NOTE — Telephone Encounter (Signed)
Alex Maddox is wanting to know if he can go to [facility] to get his coumadin levels check for right now , because he works and can't get to [place] as often as he needs to have it check. Please call him on his home number and leave a message.  Thanks

## 2013-04-10 ENCOUNTER — Other Ambulatory Visit: Payer: Self-pay | Admitting: Cardiovascular Disease

## 2013-04-10 NOTE — Telephone Encounter (Signed)
Rx was sent to pharmacy electronically. 

## 2013-04-11 ENCOUNTER — Other Ambulatory Visit: Payer: Self-pay | Admitting: Cardiovascular Disease

## 2013-04-11 NOTE — Telephone Encounter (Signed)
Rx was sent to pharmacy electronically. 

## 2013-05-18 ENCOUNTER — Other Ambulatory Visit: Payer: Self-pay | Admitting: Cardiovascular Disease

## 2013-05-18 DIAGNOSIS — I4891 Unspecified atrial fibrillation: Secondary | ICD-10-CM

## 2013-05-18 DIAGNOSIS — I495 Sick sinus syndrome: Secondary | ICD-10-CM

## 2013-05-18 LAB — PACEMAKER DEVICE OBSERVATION

## 2013-05-24 ENCOUNTER — Encounter: Payer: Self-pay | Admitting: *Deleted

## 2013-05-29 LAB — REMOTE PACEMAKER DEVICE
ATRIAL PACING PM: 37.9
BATTERY VOLTAGE: 3 V
RV LEAD AMPLITUDE: 15.5 mv
VENTRICULAR PACING PM: 0.1

## 2013-05-30 ENCOUNTER — Encounter: Payer: Self-pay | Admitting: Cardiovascular Disease

## 2013-06-24 ENCOUNTER — Ambulatory Visit: Payer: Self-pay | Admitting: Pharmacist Clinician (PhC)/ Clinical Pharmacy Specialist

## 2013-06-24 DIAGNOSIS — Z7901 Long term (current) use of anticoagulants: Secondary | ICD-10-CM

## 2013-06-24 DIAGNOSIS — I4891 Unspecified atrial fibrillation: Secondary | ICD-10-CM

## 2013-08-06 ENCOUNTER — Other Ambulatory Visit: Payer: Self-pay | Admitting: Cardiovascular Disease

## 2013-08-06 NOTE — Telephone Encounter (Signed)
Rx was sent to pharmacy electronically. 

## 2013-08-23 ENCOUNTER — Encounter: Payer: Self-pay | Admitting: Cardiovascular Disease

## 2013-08-23 ENCOUNTER — Ambulatory Visit (INDEPENDENT_AMBULATORY_CARE_PROVIDER_SITE_OTHER): Payer: BC Managed Care – PPO | Admitting: Cardiovascular Disease

## 2013-08-23 ENCOUNTER — Ambulatory Visit (INDEPENDENT_AMBULATORY_CARE_PROVIDER_SITE_OTHER): Payer: BC Managed Care – PPO | Admitting: Pharmacist Clinician (PhC)/ Clinical Pharmacy Specialist

## 2013-08-23 VITALS — BP 166/103 | HR 62 | Resp 20 | Ht 72.0 in | Wt 196.3 lb

## 2013-08-23 DIAGNOSIS — I4891 Unspecified atrial fibrillation: Secondary | ICD-10-CM

## 2013-08-23 DIAGNOSIS — Z7901 Long term (current) use of anticoagulants: Secondary | ICD-10-CM

## 2013-08-23 DIAGNOSIS — R55 Syncope and collapse: Secondary | ICD-10-CM

## 2013-08-23 DIAGNOSIS — N529 Male erectile dysfunction, unspecified: Secondary | ICD-10-CM

## 2013-08-23 DIAGNOSIS — I10 Essential (primary) hypertension: Secondary | ICD-10-CM

## 2013-08-23 DIAGNOSIS — Z950 Presence of cardiac pacemaker: Secondary | ICD-10-CM

## 2013-08-23 DIAGNOSIS — E785 Hyperlipidemia, unspecified: Secondary | ICD-10-CM

## 2013-08-23 LAB — PACEMAKER DEVICE OBSERVATION

## 2013-08-23 LAB — POCT INR: INR: 1.8

## 2013-08-23 MED ORDER — SILDENAFIL CITRATE 100 MG PO TABS: 100.0000 mg | ORAL_TABLET | Freq: Every day | ORAL | Status: DC | PRN

## 2013-08-23 NOTE — Patient Instructions (Addendum)
Remote monitoring is used to monitor your pacemaker from home. This monitoring reduces the number of office visits required to check your device to one time per year. It allows [name] to keep an eye on the functioning of your device to ensure it is working properly. You are scheduled for a device check from home on 11-25-2013. You may send your transmission at any time that day. If you have a wireless device, the transmission will be sent automatically. After your physician reviews your transmission, you will receive a postcard with your next transmission date.  Your physician has requested that you regularly monitor and record your blood pressure readings at home. Please use the same machine at the same time of day to check your readings and record them to bring to your follow-up visit.  Your physician recommends that you schedule a follow-up appointment in: 12 months

## 2013-08-26 ENCOUNTER — Encounter: Payer: Self-pay | Admitting: Cardiovascular Disease

## 2013-08-26 DIAGNOSIS — Z950 Presence of cardiac pacemaker: Secondary | ICD-10-CM | POA: Insufficient documentation

## 2013-08-26 DIAGNOSIS — N529 Male erectile dysfunction, unspecified: Secondary | ICD-10-CM | POA: Insufficient documentation

## 2013-08-26 DIAGNOSIS — R55 Syncope and collapse: Secondary | ICD-10-CM | POA: Insufficient documentation

## 2013-08-26 DIAGNOSIS — E785 Hyperlipidemia, unspecified: Secondary | ICD-10-CM | POA: Insufficient documentation

## 2013-08-26 DIAGNOSIS — I10 Essential (primary) hypertension: Secondary | ICD-10-CM | POA: Insufficient documentation

## 2013-08-26 LAB — MDC_IDC_ENUM_SESS_TYPE_INCLINIC
Lead Channel Impedance Value: 424 Ohm
Lead Channel Impedance Value: 480 Ohm
Lead Channel Pacing Threshold Pulse Width: 0.6 ms
Lead Channel Pacing Threshold Pulse Width: 0.6 ms
Lead Channel Setting Pacing Amplitude: 2 V
Zone Setting Detection Interval: 430 ms

## 2013-08-26 NOTE — Assessment & Plan Note (Signed)
This may be related to his anti-hypertensive medications which is another reason why am reluctant to change them further. I gave a prescription of Allegra to the other with instructions for its safe use. I insisted on the need to avoid simultaneous treatment with nitrates.

## 2013-08-26 NOTE — Progress Notes (Signed)
Patient ID: Alex Maddox, male   DOB: 1951/01/03, 62 y.o.   MRN: [id number]      Reason for office visit Followup neurocardiogenic syncope, pacemaker, paroxysmal atrial fibrillation  Alex Maddox has generally done well since his last appointment. He has not had any new episodes of syncope. Even after pacemaker implantation he has continued to have occasional basal depressor episodes. For this reason we had to discontinue his diuretic. His blood pressure today is very high but he insisted this is quite atypical and that when he has checked his blood pressure in [place] it has not been higher than about 140/90. He has not been aware of any palpitations. He has not had any bleeding problems while on warfarin. He denies any focal neurological events to suggest embolism. He has never had a stroke or transient ischemic attack He continues to have moderate problems with erectile dysfunction and specifically inquires about the safety of using phosphodiesterase inhibitors.  No Known Allergies  Current Outpatient Prescriptions  Medication Sig Dispense Refill  . carvedilol (COREG) 12.5 MG tablet TAKE ONE TABLET TWICE DAILY  30 tablet  4  . fish oil-omega-3 fatty acids 1000 MG capsule Take 1 g by mouth daily.      . Multiple Vitamin (MULTIVITAMIN) tablet Take 1 tablet by mouth daily.      . saw palmetto 160 MG capsule Take 160 mg by mouth daily.      . simvastatin (ZOCOR) 40 MG tablet TAKE ONE (1) TABLET AT BEDTIME  90 tablet  2  . valsartan (DIOVAN) 160 MG tablet Take 160 mg by mouth daily.      Marland Kitchen warfarin (COUMADIN) 5 MG tablet Take 5 mg by mouth daily. Take one tablet daily except 1/2 tablet on Thursdays.      . sildenafil (VIAGRA) 100 MG tablet Take 1 tablet (100 mg total) by mouth daily as needed for erectile dysfunction.  14 tablet  11   No current facility-administered medications for this visit.    Past Medical History  Diagnosis Date  . Hypertension   . Stroke   . Paroxysmal atrial  fibrillation   . Dyslipidemia   . Tachycardia-bradycardia   . Sinus node dysfunction   . Syncope     2D Echocardiogram 09/27/2010 EF between 40 to 45%  . Transient ischemic attack (TIA) 06/07  . Sleep apnea, obstructive     sleep study 10/09/2007  AHI 5.73/hr  REM AHI 11.90/hr   . Abnormal echocardiogram     stress myoview 10/29/2010    Past Surgical History  Procedure Laterality Date  . Pacemaker insertion  05/30/2011    Medtronic Revo   last checked 11/07/2012  . Loop recorder implant  02/02/2011    Medtronic- CRM     Family History  Problem Relation Age of Onset  . Heart attack Father 57    deceased from heart attack    History   Social History  . Marital Status: Married    Spouse Name: N/A    Number of Children: N/A  . Years of Education: N/A   Occupational History  . Not on file.   Social History Main Topics  . Smoking status: Never Smoker   . Smokeless tobacco: Not on file  . Alcohol Use: No  . Drug Use: No  . Sexual Activity: Not on file   Other Topics Concern  . Not on file   Social History Narrative  . No narrative on file    Review of systems: The  patient specifically denies any chest pain at rest or with exertion, dyspnea at rest or with exertion, orthopnea, paroxysmal nocturnal dyspnea, syncope, palpitations, focal neurological deficits, intermittent claudication, lower extremity edema, unexplained weight gain, cough, hemoptysis or wheezing.  The patient also denies abdominal pain, nausea, vomiting, dysphagia, diarrhea, constipation, polyuria, polydipsia, dysuria, hematuria, frequency, urgency, abnormal bleeding or bruising, fever, chills, unexpected weight changes, mood swings, change in skin or hair texture, change in voice quality, auditory or visual problems, allergic reactions or rashes, new musculoskeletal complaints other than usual "aches and pains". He has erectile dysfunction.  PHYSICAL EXAM BP 166/103  Pulse 62  Resp 20  Ht 6' (1.829  m)  Wt 196 lb 4.8 oz (89.041 kg)  BMI 26.62 kg/m2  General: Alert, oriented x3, no distress Head: no evidence of trauma, PERRL, EOMI, no exophtalmos or lid lag, no myxedema, no xanthelasma; normal ears, nose and oropharynx Neck: normal jugular venous pulsations and no hepatojugular reflux; brisk carotid pulses without delay and no carotid bruits Chest: clear to auscultation, no signs of consolidation by percussion or palpation, normal fremitus, symmetrical and full respiratory excursions Cardiovascular: normal position and quality of the apical impulse, regular rhythm, normal first and second heart sounds, no murmurs, rubs or gallops Abdomen: no tenderness or distention, no masses by palpation, no abnormal pulsatility or arterial bruits, normal bowel sounds, no hepatosplenomegaly Extremities: no clubbing, cyanosis or edema; 2+ radial, ulnar and brachial pulses bilaterally; 2+ right femoral, posterior tibial and dorsalis pedis pulses; 2+ left femoral, posterior tibial and dorsalis pedis pulses; no subclavian or femoral bruits Neurological: grossly nonfocal   EKG: Atrial paced ventricular sensed rhythm, incomplete right bundle branch block, otherwise normal  Lipid Panel May 2013 total cholesterol 135, triglycerides 76, HDL 35, LDL 85   BMET    Component Value Date/Time   NA 137 11/04/2012 1355   K 3.3* 11/04/2012 1355   CL 100 11/04/2012 1355   CO2 32 11/04/2012 1355   GLUCOSE 106* 11/04/2012 1355   BUN 16 11/04/2012 1355   CREATININE 1.03 11/04/2012 1355   CALCIUM 10.1 11/04/2012 1355   GFRNONAA 77* 11/04/2012 1355   GFRAA 89* 11/04/2012 1355     ASSESSMENT AND PLAN Pacemaker - Medtronic REVO dual-chamber implanted August 2012 Normal device function. 38% atrial pacing, less than 1% ventricular pacing. Thresholds, sensing, impedances consistent with previous measurements. No mode switch episodes. 3 NSVT episodes noted (2 since last RDC)---max dur. 3 sec, Max V 189---true NSVT. Patient also  had 15 Fast A +V episodes, possibly sinus tachycardia. Device programmed at appropriate safety margins. Histogram distribution appropriate for patient activity level. Device programmed to optimize intrinsic conduction.  Continue every 3 month care Link downloads. No permanent changes made to device programming today.     Neurocardiogenic syncope After stopping his diuretic he has not had any further episodes of near syncope. Unfortunately the diuretics and has had a very positive impact on his blood pressure.  HTN (hypertension) His blood pressure today was very high, but also very unusual for him. He has agreed to keep a home recording of his blood pressure readings and send it to me. We might consider increasing the carvedilol dose. I would avoid adding back a diuretic. Another option might be to add low-dose amlodipine.  Hyperlipidemia His last lipid profile was satisfactory. He has had a more recent assay performed at [facility] and we'll get those records. No changes to his medications at this time. Note normal nuclear stress testing January 2012  Erectile dysfunction This may be related to his anti-hypertensive medications which is another reason why am reluctant to change them further. I gave a prescription of Allegra to the other with instructions for its safe use. I insisted on the need to avoid simultaneous treatment with nitrates.   Patient Instructions  Remote monitoring is used to monitor your pacemaker from home. This monitoring reduces the number of office visits required to check your device to one time per year. It allows [name] to keep an eye on the functioning of your device to ensure it is working properly. You are scheduled for a device check from home on 11-25-2013. You may send your transmission at any time that day. If you have a wireless device, the transmission will be sent automatically. After your physician reviews your transmission, you will receive a postcard with  your next transmission date.  Your physician has requested that you regularly monitor and record your blood pressure readings at home. Please use the same machine at the same time of day to check your readings and record them to bring to your follow-up visit.  Your physician recommends that you schedule a follow-up appointment in: 12 months      Orders Placed This Encounter  Procedures  . Implantable device check  . EKG 12-Lead   Meds ordered this encounter  Medications  . saw palmetto 160 MG capsule    Sig: Take 160 mg by mouth daily.  . sildenafil (VIAGRA) 100 MG tablet    Sig: Take 1 tablet (100 mg total) by mouth daily as needed for erectile dysfunction.    Dispense:  14 tablet    Refill:  [address] [place], [facility] [phone] office [phone] pager

## 2013-08-26 NOTE — Assessment & Plan Note (Signed)
His last lipid profile was satisfactory. He has had a more recent assay performed at [facility] and we'll get those records. No changes to his medications at this time. Note normal nuclear stress testing January 2012

## 2013-08-26 NOTE — Assessment & Plan Note (Signed)
After stopping his diuretic he has not had any further episodes of near syncope. Unfortunately the diuretics and has had a very positive impact on his blood pressure.

## 2013-08-26 NOTE — Assessment & Plan Note (Signed)
Normal device function. 38% atrial pacing, less than 1% ventricular pacing. Thresholds, sensing, impedances consistent with previous measurements. No mode switch episodes. 3 NSVT episodes noted (2 since last RDC)---max dur. 3 sec, Max V 189---true NSVT. Patient also had 15 Fast A +V episodes, possibly sinus tachycardia. Device programmed at appropriate safety margins. Histogram distribution appropriate for patient activity level. Device programmed to optimize intrinsic conduction.  Continue every 3 month care Link downloads. No permanent changes made to device programming today.

## 2013-08-26 NOTE — Assessment & Plan Note (Signed)
His blood pressure today was very high, but also very unusual for him. He has agreed to keep a home recording of his blood pressure readings and send it to me. We might consider increasing the carvedilol dose. I would avoid adding back a diuretic. Another option might be to add low-dose amlodipine.

## 2013-08-27 ENCOUNTER — Encounter: Payer: Self-pay | Admitting: Cardiovascular Disease

## 2013-09-12 ENCOUNTER — Ambulatory Visit (INDEPENDENT_AMBULATORY_CARE_PROVIDER_SITE_OTHER): Payer: BC Managed Care – PPO | Admitting: *Deleted

## 2013-09-12 DIAGNOSIS — Z7901 Long term (current) use of anticoagulants: Secondary | ICD-10-CM

## 2013-09-12 DIAGNOSIS — I4891 Unspecified atrial fibrillation: Secondary | ICD-10-CM

## 2013-09-12 LAB — POCT INR: INR: 2.1

## 2013-09-23 ENCOUNTER — Other Ambulatory Visit: Payer: Self-pay | Admitting: Cardiovascular Disease

## 2013-09-23 NOTE — Telephone Encounter (Signed)
Rx was sent to pharmacy electronically. 

## 2013-10-24 ENCOUNTER — Ambulatory Visit (INDEPENDENT_AMBULATORY_CARE_PROVIDER_SITE_OTHER): Payer: BC Managed Care – PPO | Admitting: *Deleted

## 2013-10-24 ENCOUNTER — Other Ambulatory Visit: Payer: Self-pay | Admitting: Pharmacist Clinician (PhC)/ Clinical Pharmacy Specialist

## 2013-10-24 DIAGNOSIS — I4891 Unspecified atrial fibrillation: Secondary | ICD-10-CM

## 2013-10-24 DIAGNOSIS — Z7901 Long term (current) use of anticoagulants: Secondary | ICD-10-CM

## 2013-10-24 LAB — POCT INR: INR: 5.2

## 2013-10-24 MED ORDER — WARFARIN SODIUM 5 MG PO TABS: ORAL_TABLET | ORAL | Status: DC

## 2013-11-07 ENCOUNTER — Ambulatory Visit (INDEPENDENT_AMBULATORY_CARE_PROVIDER_SITE_OTHER): Payer: BC Managed Care – PPO | Admitting: *Deleted

## 2013-11-07 DIAGNOSIS — Z7901 Long term (current) use of anticoagulants: Secondary | ICD-10-CM

## 2013-11-07 DIAGNOSIS — Z5181 Encounter for therapeutic drug level monitoring: Secondary | ICD-10-CM | POA: Insufficient documentation

## 2013-11-07 DIAGNOSIS — I4891 Unspecified atrial fibrillation: Secondary | ICD-10-CM

## 2013-11-07 LAB — POCT INR: INR: 2.6

## 2013-11-25 ENCOUNTER — Encounter: Payer: Self-pay | Admitting: Cardiovascular Disease

## 2013-11-25 ENCOUNTER — Ambulatory Visit (INDEPENDENT_AMBULATORY_CARE_PROVIDER_SITE_OTHER): Payer: BC Managed Care – PPO | Admitting: *Deleted

## 2013-11-25 DIAGNOSIS — I4891 Unspecified atrial fibrillation: Secondary | ICD-10-CM

## 2013-11-26 LAB — MDC_IDC_ENUM_SESS_TYPE_REMOTE
Brady Statistic AP VP Percent: 0.02 %
Brady Statistic AS VP Percent: 0.01 %
Brady Statistic AS VS Percent: 68.77 %
Lead Channel Impedance Value: 416 Ohm
Lead Channel Sensing Intrinsic Amplitude: 4.312 mV
Lead Channel Setting Pacing Amplitude: 2 V
Lead Channel Setting Pacing Pulse Width: 0.6 ms
Lead Channel Setting Sensing Sensitivity: 0.9 mV
MDC IDC MSMT BATTERY VOLTAGE: 3 V
MDC IDC MSMT LEADCHNL RV IMPEDANCE VALUE: 424 Ohm
MDC IDC MSMT LEADCHNL RV SENSING INTR AMPL: 13.8163
MDC IDC SESS DTM: 20150217025116
MDC IDC SET LEADCHNL RV PACING AMPLITUDE: 2 V
MDC IDC SET ZONE DETECTION INTERVAL: 400 ms
MDC IDC SET ZONE DETECTION INTERVAL: 430 ms
MDC IDC STAT BRADY AP VS PERCENT: 31.2 %
MDC IDC STAT BRADY RA PERCENT PACED: 31.22 %
MDC IDC STAT BRADY RV PERCENT PACED: 0.04 %

## 2013-11-28 ENCOUNTER — Ambulatory Visit (INDEPENDENT_AMBULATORY_CARE_PROVIDER_SITE_OTHER): Payer: BC Managed Care – PPO | Admitting: *Deleted

## 2013-11-28 ENCOUNTER — Encounter (INDEPENDENT_AMBULATORY_CARE_PROVIDER_SITE_OTHER): Payer: Self-pay

## 2013-11-28 DIAGNOSIS — Z5181 Encounter for therapeutic drug level monitoring: Secondary | ICD-10-CM

## 2013-11-28 DIAGNOSIS — Z7901 Long term (current) use of anticoagulants: Secondary | ICD-10-CM

## 2013-11-28 DIAGNOSIS — I4891 Unspecified atrial fibrillation: Secondary | ICD-10-CM

## 2013-11-28 LAB — POCT INR: INR: 2.6

## 2013-12-09 ENCOUNTER — Encounter: Payer: Self-pay | Admitting: *Deleted

## 2013-12-26 ENCOUNTER — Ambulatory Visit (INDEPENDENT_AMBULATORY_CARE_PROVIDER_SITE_OTHER): Payer: BC Managed Care – PPO | Admitting: *Deleted

## 2013-12-26 DIAGNOSIS — Z5181 Encounter for therapeutic drug level monitoring: Secondary | ICD-10-CM

## 2013-12-26 DIAGNOSIS — Z7901 Long term (current) use of anticoagulants: Secondary | ICD-10-CM

## 2013-12-26 DIAGNOSIS — I4891 Unspecified atrial fibrillation: Secondary | ICD-10-CM

## 2013-12-26 LAB — POCT INR: INR: 2.8

## 2014-01-23 ENCOUNTER — Ambulatory Visit (INDEPENDENT_AMBULATORY_CARE_PROVIDER_SITE_OTHER): Payer: BC Managed Care – PPO | Admitting: *Deleted

## 2014-01-23 DIAGNOSIS — I4891 Unspecified atrial fibrillation: Secondary | ICD-10-CM

## 2014-01-23 DIAGNOSIS — Z7901 Long term (current) use of anticoagulants: Secondary | ICD-10-CM

## 2014-01-23 DIAGNOSIS — Z5181 Encounter for therapeutic drug level monitoring: Secondary | ICD-10-CM

## 2014-01-23 LAB — POCT INR: INR: 2.3

## 2014-02-25 ENCOUNTER — Other Ambulatory Visit: Payer: Self-pay | Admitting: Cardiovascular Disease

## 2014-02-26 ENCOUNTER — Telehealth: Payer: Self-pay | Admitting: Cardiology

## 2014-02-26 ENCOUNTER — Encounter: Payer: Self-pay | Admitting: Cardiovascular Disease

## 2014-02-26 ENCOUNTER — Ambulatory Visit (INDEPENDENT_AMBULATORY_CARE_PROVIDER_SITE_OTHER): Payer: BC Managed Care – PPO | Admitting: *Deleted

## 2014-02-26 DIAGNOSIS — I4891 Unspecified atrial fibrillation: Secondary | ICD-10-CM

## 2014-02-26 DIAGNOSIS — Z950 Presence of cardiac pacemaker: Secondary | ICD-10-CM

## 2014-02-26 NOTE — Telephone Encounter (Signed)
LMOVM reminding pt to send remote transmission.   

## 2014-02-27 NOTE — Progress Notes (Signed)
Remote pacemaker transmission.   

## 2014-03-06 ENCOUNTER — Ambulatory Visit (INDEPENDENT_AMBULATORY_CARE_PROVIDER_SITE_OTHER): Payer: BC Managed Care – PPO | Admitting: *Deleted

## 2014-03-06 DIAGNOSIS — Z7901 Long term (current) use of anticoagulants: Secondary | ICD-10-CM

## 2014-03-06 DIAGNOSIS — I4891 Unspecified atrial fibrillation: Secondary | ICD-10-CM

## 2014-03-06 DIAGNOSIS — Z5181 Encounter for therapeutic drug level monitoring: Secondary | ICD-10-CM

## 2014-03-06 LAB — POCT INR: INR: 2.9

## 2014-03-09 LAB — MDC_IDC_ENUM_SESS_TYPE_REMOTE
Battery Voltage: 3 V
Brady Statistic AP VP Percent: 0.46 %
Brady Statistic AS VP Percent: 0.1 %
Brady Statistic RV Percent Paced: 0.56 %
Lead Channel Sensing Intrinsic Amplitude: 4.488 mV
Lead Channel Setting Pacing Amplitude: 2 V
Lead Channel Setting Pacing Pulse Width: 0.6 ms
MDC IDC MSMT LEADCHNL RA IMPEDANCE VALUE: 416 Ohm
MDC IDC MSMT LEADCHNL RV IMPEDANCE VALUE: 424 Ohm
MDC IDC MSMT LEADCHNL RV SENSING INTR AMPL: 13.4874
MDC IDC SESS DTM: 20150521012110
MDC IDC SET LEADCHNL RV PACING AMPLITUDE: 2 V
MDC IDC SET LEADCHNL RV SENSING SENSITIVITY: 0.9 mV
MDC IDC SET ZONE DETECTION INTERVAL: 400 ms
MDC IDC STAT BRADY AP VS PERCENT: 40.53 %
MDC IDC STAT BRADY AS VS PERCENT: 58.91 %
MDC IDC STAT BRADY RA PERCENT PACED: 40.99 %
Zone Setting Detection Interval: 430 ms

## 2014-03-10 DIAGNOSIS — C44621 Squamous cell carcinoma of skin of unspecified upper limb, including shoulder: Secondary | ICD-10-CM

## 2014-03-10 HISTORY — DX: Squamous cell carcinoma of skin of unspecified upper limb, including shoulder: C44.621

## 2014-03-14 ENCOUNTER — Encounter: Payer: Self-pay | Admitting: Cardiology

## 2014-04-08 ENCOUNTER — Ambulatory Visit (INDEPENDENT_AMBULATORY_CARE_PROVIDER_SITE_OTHER): Payer: BC Managed Care – PPO | Admitting: Cardiology

## 2014-04-08 ENCOUNTER — Encounter: Payer: Self-pay | Admitting: Cardiology

## 2014-04-08 VITALS — BP 172/102 | HR 71 | Ht 72.0 in | Wt 194.7 lb

## 2014-04-08 DIAGNOSIS — I10 Essential (primary) hypertension: Secondary | ICD-10-CM

## 2014-04-08 MED ORDER — CARVEDILOL 25 MG PO TABS: 25.0000 mg | ORAL_TABLET | Freq: Two times a day (BID) | ORAL | Status: DC

## 2014-04-08 NOTE — Progress Notes (Signed)
Patient ID: Alex Maddox, male   DOB: 06-10-1951, 63 y.o.   MRN: [id number]    04/08/2014 Alex Maddox   01/26/1951  [id number]  Primary Physicia Alex Grills, MD Primary Cardiologist: Dr Alex Maddox  HPI:  The patient is a 63 year old male, followed by Dr. Sallyanne Maddox, with a history of hypertension, paroxysmal atrial fibrillation on chronic warfarin therapy, status post permanent pacemaker insertion in August 2012, hyper-lipidemia as well as prior history of CVA.   He presents to clinic today for evaluation of hypertension. He has been keeping regular check of his blood pressure at home and has noted persistent, moderate to severe hypertension with systolic pressures ranging from 160s to as high as the low 200s. He denies any symptoms, including headaches, dizziness, chest pain or shortness of breath. He was seen and evaluated by his primary care provider 2 weeks ago for routine physical examination in his valsartan was doubled from 160 mg daily to 230 mg daily. He is also on , 12.5 mg, twice a day. He admits that he does not always make healthy food choices. He eats out often. Last night for dinner, he had a burger and fries. Again today, he had another burger for lunch. He denies tobacco use. No excessive caffeine use. He denies any pain. He states that he has had a lot of stress and anxiety recently over his blood pressure, which may be worsening his hypertension. He states that he gets plenty of physical activity on his job. He also walks on occasion. He states yesterday, he walked 2 miles without difficulty.  His pressure today in the office is 172/102. Pulse rate is 71. His EKG demonstrates normal sinus rhythm without any ischemic changes.   Current Outpatient Prescriptions  Medication Sig Dispense Refill  . fish oil-omega-3 fatty acids 1000 MG capsule Take 1 g by mouth daily.      . Multiple Vitamin (MULTIVITAMIN) tablet Take 1 tablet by mouth daily.      . saw palmetto 160 MG capsule  Take 160 mg by mouth daily.      . sildenafil (VIAGRA) 100 MG tablet Take 1 tablet (100 mg total) by mouth daily as needed for erectile dysfunction.  14 tablet  11  . simvastatin (ZOCOR) 40 MG tablet TAKE ONE (1) TABLET AT BEDTIME  90 tablet  1  . valsartan (DIOVAN) 320 MG tablet Take 320 mg by mouth daily.      Marland Kitchen warfarin (COUMADIN) 5 MG tablet Take 1 tablet by mouth daily or as directed  30 tablet  5  . carvedilol (COREG) 25 MG tablet Take 1 tablet (25 mg total) by mouth 2 (two) times daily.  60 tablet  5   No current facility-administered medications for this visit.    No Known Allergies  History   Social History  . Marital Status: Married    Spouse Name: N/A    Number of Children: N/A  . Years of Education: N/A   Occupational History  . Not on file.   Social History Main Topics  . Smoking status: Never Smoker   . Smokeless tobacco: Not on file  . Alcohol Use: No  . Drug Use: No  . Sexual Activity: Not on file   Other Topics Concern  . Not on file   Social History Narrative  . No narrative on file     Review of Systems: General: negative for chills, fever, night sweats or weight changes.  Cardiovascular: negative for chest pain, dyspnea on exertion,  edema, orthopnea, palpitations, paroxysmal nocturnal dyspnea or shortness of breath Dermatological: negative for rash Respiratory: negative for cough or wheezing Urologic: negative for hematuria Abdominal: negative for nausea, vomiting, diarrhea, bright red blood per rectum, melena, or hematemesis Neurologic: negative for visual changes, syncope, or dizziness All other systems reviewed and are otherwise negative except as noted above.    Blood pressure 172/102, pulse 71, height 6' (1.829 m), weight 194 lb 11.2 oz (88.315 kg).  General appearance: alert, cooperative and no distress Neck: no carotid bruit and no JVD Lungs: clear to auscultation bilaterally Heart: regular rate and rhythm and Thanks 1/6 systolic murmur  at the right upper sternal border Extremities: no LEE Pulses: 2+ and symmetric Skin: Skin color, texture, turgor normal. No rashes or lesions Neurologic: Grossly normal  EKG Normal sinus rhythm. Heart rate 71 beats per minute.  ASSESSMENT AND PLAN:   1. Hypertension: This appears to be be poorly controlled over the past few weeks. His blood pressure today is elevated at 172/102. He is asymptomatic, without chest pain, dizziness, headache or shortness of breath. His EKG demonstrates normal sinus rhythm without any ischemic abnormalities. His PCP increased his valsartan to 320 mg daily. He has been instructed to continue this dose. We will increase his Coreg to 25 mg twice a day. He has been instructed to continue to monitor his blood pressure at home closely and to keep a record of the recordings. We also discussed better food choices, avoiding foods high in sodium. I also encouraged him to increase physical activity. I recommend that he adopt a walking program or engage in mild to moderate physical activity for at least 30 minutes, 5 times a week. He will call back to report his blood pressure readings in one week. If he continues to have persistent hypertension, despite increases in medical therapy, he will need to followup in our office for repeat assessment and for further medication adjustments. If his blood pressure is improved, and he will be instructed to continue his current medical regimen and will need to followup with Dr. Sallyanne Maddox in November for routine physical assessment.  2. Paroxysmal atrial fibrillation: Currently in normal sinus rhythm. On Coreg for rate control. Continue warfarin for anticoagulation. His INRs followed by his PCP.  3. Hyperlipidemia: Continue statin therapy. This is followed by his PCP.  4. Permanent Pacemaker: Device is checked every 3 months from home. He is scheduled for his yearly routine visit with Dr. Sallyanne Maddox in November  PLAN   Increase Coreg to 25 mg  twice a day. Continue valsartan, 320 mg daily. He has been advised to avoid high sodium foods and to increase his physical activity. He will continue to check his blood pressures at home. He will followup with our office in one week. He will need to return to clinic if his condition worsens or fail to improve. Otherwise, he has been instructed to followup with Dr. Sallyanne Maddox in November.  Issaac Shipper, BRITTAINYPA-C 04/08/2014 3:22 PM

## 2014-04-08 NOTE — Patient Instructions (Signed)
Your Coreg was increased to 25mg  BID, you can just double what you have until you run out.  Check your Blood Pressure Twice Daily for 1 week, if your Systolic (top number) get above 150 call us for a follow up appointment. If your blood pressures stay normal just follow up with Dr.Croitoru in November.

## 2014-04-09 ENCOUNTER — Other Ambulatory Visit: Payer: Self-pay | Admitting: *Deleted

## 2014-04-17 ENCOUNTER — Ambulatory Visit (INDEPENDENT_AMBULATORY_CARE_PROVIDER_SITE_OTHER): Payer: BC Managed Care – PPO | Admitting: *Deleted

## 2014-04-17 DIAGNOSIS — I4891 Unspecified atrial fibrillation: Secondary | ICD-10-CM

## 2014-04-17 DIAGNOSIS — Z5181 Encounter for therapeutic drug level monitoring: Secondary | ICD-10-CM

## 2014-04-17 DIAGNOSIS — Z7901 Long term (current) use of anticoagulants: Secondary | ICD-10-CM

## 2014-04-17 LAB — POCT INR: INR: 3

## 2014-04-22 ENCOUNTER — Other Ambulatory Visit: Payer: Self-pay | Admitting: Cardiovascular Disease

## 2014-04-27 ENCOUNTER — Other Ambulatory Visit: Payer: Self-pay | Admitting: Cardiovascular Disease

## 2014-04-28 NOTE — Telephone Encounter (Signed)
Rx refill denied to patient pharmacy, Rx was refilled 02/11/14 for 90 with 1 additional

## 2014-05-29 ENCOUNTER — Ambulatory Visit (INDEPENDENT_AMBULATORY_CARE_PROVIDER_SITE_OTHER): Payer: BC Managed Care – PPO | Admitting: *Deleted

## 2014-05-29 DIAGNOSIS — Z5181 Encounter for therapeutic drug level monitoring: Secondary | ICD-10-CM

## 2014-05-29 DIAGNOSIS — Z7901 Long term (current) use of anticoagulants: Secondary | ICD-10-CM

## 2014-05-29 DIAGNOSIS — I4891 Unspecified atrial fibrillation: Secondary | ICD-10-CM

## 2014-05-29 LAB — POCT INR: INR: 3.2

## 2014-06-02 ENCOUNTER — Telehealth: Payer: Self-pay | Admitting: Cardiology

## 2014-06-02 ENCOUNTER — Ambulatory Visit (INDEPENDENT_AMBULATORY_CARE_PROVIDER_SITE_OTHER): Payer: BC Managed Care – PPO | Admitting: *Deleted

## 2014-06-02 DIAGNOSIS — I4891 Unspecified atrial fibrillation: Secondary | ICD-10-CM

## 2014-06-02 NOTE — Telephone Encounter (Signed)
Spoke with pt and reminded pt of remote transmission that is due today. Pt verbalized understanding.   

## 2014-06-03 LAB — MDC_IDC_ENUM_SESS_TYPE_REMOTE
Battery Voltage: 3 V
Brady Statistic AP VP Percent: 0.11 %
Brady Statistic AP VS Percent: 39.4 %
Brady Statistic AS VP Percent: 0.28 %
Brady Statistic RV Percent Paced: 0.39 %
Lead Channel Impedance Value: 416 Ohm
Lead Channel Impedance Value: 488 Ohm
Lead Channel Sensing Intrinsic Amplitude: 4.664 mV
Lead Channel Setting Pacing Amplitude: 2 V
Lead Channel Setting Pacing Amplitude: 2 V
Lead Channel Setting Sensing Sensitivity: 0.9 mV
MDC IDC MSMT LEADCHNL RV SENSING INTR AMPL: 7.2 mV
MDC IDC SESS DTM: 20150825011410
MDC IDC SET LEADCHNL RV PACING PULSEWIDTH: 0.6 ms
MDC IDC STAT BRADY AS VS PERCENT: 60.21 %
MDC IDC STAT BRADY RA PERCENT PACED: 39.51 %
Zone Setting Detection Interval: 400 ms
Zone Setting Detection Interval: 430 ms

## 2014-06-03 NOTE — Progress Notes (Signed)
Remote pacemaker transmission.   

## 2014-06-10 ENCOUNTER — Encounter: Payer: Self-pay | Admitting: Cardiology

## 2014-06-13 ENCOUNTER — Encounter: Payer: Self-pay | Admitting: Cardiovascular Disease

## 2014-06-26 ENCOUNTER — Ambulatory Visit (INDEPENDENT_AMBULATORY_CARE_PROVIDER_SITE_OTHER): Payer: BC Managed Care – PPO | Admitting: *Deleted

## 2014-06-26 DIAGNOSIS — I4891 Unspecified atrial fibrillation: Secondary | ICD-10-CM

## 2014-06-26 DIAGNOSIS — Z5181 Encounter for therapeutic drug level monitoring: Secondary | ICD-10-CM

## 2014-06-26 DIAGNOSIS — Z7901 Long term (current) use of anticoagulants: Secondary | ICD-10-CM

## 2014-06-26 LAB — POCT INR: INR: 3.4

## 2014-07-28 ENCOUNTER — Ambulatory Visit (INDEPENDENT_AMBULATORY_CARE_PROVIDER_SITE_OTHER): Payer: BC Managed Care – PPO | Admitting: *Deleted

## 2014-07-28 DIAGNOSIS — Z5181 Encounter for therapeutic drug level monitoring: Secondary | ICD-10-CM

## 2014-07-28 DIAGNOSIS — Z7901 Long term (current) use of anticoagulants: Secondary | ICD-10-CM

## 2014-07-28 DIAGNOSIS — I4891 Unspecified atrial fibrillation: Secondary | ICD-10-CM

## 2014-07-28 LAB — POCT INR: INR: 1.9

## 2014-08-12 ENCOUNTER — Emergency Department (HOSPITAL_COMMUNITY): Payer: BC Managed Care – PPO

## 2014-08-12 ENCOUNTER — Inpatient Hospital Stay (HOSPITAL_COMMUNITY)
Admission: EM | Admit: 2014-08-12 | Discharge: 2014-08-16 | DRG: 083 | Disposition: A | Discharge status: Home / Self Care | Payer: BC Managed Care – PPO | Attending: Internal Medicine | Admitting: Internal Medicine

## 2014-08-12 ENCOUNTER — Encounter (HOSPITAL_COMMUNITY): Payer: Self-pay

## 2014-08-12 DIAGNOSIS — S2239XA Fracture of one rib, unspecified side, initial encounter for closed fracture: Secondary | ICD-10-CM | POA: Insufficient documentation

## 2014-08-12 DIAGNOSIS — G4733 Obstructive sleep apnea (adult) (pediatric): Secondary | ICD-10-CM | POA: Diagnosis present

## 2014-08-12 DIAGNOSIS — I480 Paroxysmal atrial fibrillation: Secondary | ICD-10-CM | POA: Diagnosis present

## 2014-08-12 DIAGNOSIS — S06349A Traumatic hemorrhage of right cerebrum with loss of consciousness of unspecified duration, initial encounter: Secondary | ICD-10-CM | POA: Diagnosis present

## 2014-08-12 DIAGNOSIS — S2232XA Fracture of one rib, left side, initial encounter for closed fracture: Secondary | ICD-10-CM | POA: Diagnosis present

## 2014-08-12 DIAGNOSIS — S2242XA Multiple fractures of ribs, left side, initial encounter for closed fracture: Secondary | ICD-10-CM | POA: Diagnosis present

## 2014-08-12 DIAGNOSIS — Z950 Presence of cardiac pacemaker: Secondary | ICD-10-CM | POA: Diagnosis present

## 2014-08-12 DIAGNOSIS — I4510 Unspecified right bundle-branch block: Secondary | ICD-10-CM | POA: Diagnosis present

## 2014-08-12 DIAGNOSIS — M25512 Pain in left shoulder: Secondary | ICD-10-CM | POA: Diagnosis present

## 2014-08-12 DIAGNOSIS — R079 Chest pain, unspecified: Secondary | ICD-10-CM

## 2014-08-12 DIAGNOSIS — I422 Other hypertrophic cardiomyopathy: Secondary | ICD-10-CM | POA: Diagnosis present

## 2014-08-12 DIAGNOSIS — W11XXXA Fall on and from ladder, initial encounter: Secondary | ICD-10-CM | POA: Diagnosis present

## 2014-08-12 DIAGNOSIS — S0990XA Unspecified injury of head, initial encounter: Secondary | ICD-10-CM | POA: Insufficient documentation

## 2014-08-12 DIAGNOSIS — W19XXXA Unspecified fall, initial encounter: Secondary | ICD-10-CM | POA: Insufficient documentation

## 2014-08-12 DIAGNOSIS — I2510 Atherosclerotic heart disease of native coronary artery without angina pectoris: Secondary | ICD-10-CM | POA: Diagnosis present

## 2014-08-12 DIAGNOSIS — Z8673 Personal history of transient ischemic attack (TIA), and cerebral infarction without residual deficits: Secondary | ICD-10-CM | POA: Diagnosis not present

## 2014-08-12 DIAGNOSIS — I629 Nontraumatic intracranial hemorrhage, unspecified: Secondary | ICD-10-CM

## 2014-08-12 DIAGNOSIS — H538 Other visual disturbances: Secondary | ICD-10-CM | POA: Diagnosis present

## 2014-08-12 DIAGNOSIS — M1990 Unspecified osteoarthritis, unspecified site: Secondary | ICD-10-CM | POA: Diagnosis present

## 2014-08-12 DIAGNOSIS — E785 Hyperlipidemia, unspecified: Secondary | ICD-10-CM | POA: Insufficient documentation

## 2014-08-12 DIAGNOSIS — Z7901 Long term (current) use of anticoagulants: Secondary | ICD-10-CM | POA: Diagnosis not present

## 2014-08-12 DIAGNOSIS — I619 Nontraumatic intracerebral hemorrhage, unspecified: Secondary | ICD-10-CM | POA: Insufficient documentation

## 2014-08-12 DIAGNOSIS — R55 Syncope and collapse: Secondary | ICD-10-CM | POA: Diagnosis present

## 2014-08-12 DIAGNOSIS — Z79899 Other long term (current) drug therapy: Secondary | ICD-10-CM

## 2014-08-12 DIAGNOSIS — I10 Essential (primary) hypertension: Secondary | ICD-10-CM | POA: Insufficient documentation

## 2014-08-12 DIAGNOSIS — Z981 Arthrodesis status: Secondary | ICD-10-CM | POA: Diagnosis not present

## 2014-08-12 DIAGNOSIS — S2249XA Multiple fractures of ribs, unspecified side, initial encounter for closed fracture: Secondary | ICD-10-CM | POA: Insufficient documentation

## 2014-08-12 DIAGNOSIS — I4891 Unspecified atrial fibrillation: Secondary | ICD-10-CM | POA: Diagnosis present

## 2014-08-12 HISTORY — DX: Presence of cardiac pacemaker: Z95.0

## 2014-08-12 HISTORY — DX: Unspecified osteoarthritis, unspecified site: M19.90

## 2014-08-12 HISTORY — DX: Squamous cell carcinoma of skin of unspecified upper limb, including shoulder: C44.621

## 2014-08-12 LAB — COMPREHENSIVE METABOLIC PANEL
ALK PHOS: 57 U/L (ref 39–117)
ALT: 34 U/L (ref 0–53)
ANION GAP: 10 (ref 5–15)
AST: 33 U/L (ref 0–37)
Albumin: 3.9 g/dL (ref 3.5–5.2)
BUN: 16 mg/dL (ref 6–23)
CO2: 28 meq/L (ref 19–32)
Calcium: 10 mg/dL (ref 8.4–10.5)
Chloride: 103 mEq/L (ref 96–112)
Creatinine, Ser: 1.06 mg/dL (ref 0.50–1.35)
GFR calc non Af Amer: 73 mL/min — ABNORMAL LOW (ref >90)
GFR, EST AFRICAN AMERICAN: 85 mL/min — AB (ref >90)
GLUCOSE: 101 mg/dL — AB (ref 70–99)
POTASSIUM: 3.8 meq/L (ref 3.7–5.3)
SODIUM: 141 meq/L (ref 137–147)
Total Bilirubin: 0.6 mg/dL (ref 0.3–1.2)
Total Protein: 6.9 g/dL (ref 6.0–8.3)

## 2014-08-12 LAB — CBC WITH DIFFERENTIAL/PLATELET
Basophils Absolute: 0 10*3/uL (ref 0.0–0.1)
Basophils Relative: 0 % (ref 0–1)
EOS ABS: 0.1 10*3/uL (ref 0.0–0.7)
EOS PCT: 1 % (ref 0–5)
HCT: 45.7 % (ref 39.0–52.0)
HEMOGLOBIN: 15.9 g/dL (ref 13.0–17.0)
LYMPHS ABS: 0.9 10*3/uL (ref 0.7–4.0)
LYMPHS PCT: 9 % — AB (ref 12–46)
MCH: 31.7 pg (ref 26.0–34.0)
MCHC: 34.8 g/dL (ref 30.0–36.0)
MCV: 91 fL (ref 78.0–100.0)
MONOS PCT: 7 % (ref 3–12)
Monocytes Absolute: 0.6 10*3/uL (ref 0.1–1.0)
Neutro Abs: 8.1 10*3/uL — ABNORMAL HIGH (ref 1.7–7.7)
Neutrophils Relative %: 83 % — ABNORMAL HIGH (ref 43–77)
PLATELETS: 136 10*3/uL — AB (ref 150–400)
RBC: 5.02 MIL/uL (ref 4.22–5.81)
RDW: 12.4 % (ref 11.5–15.5)
WBC: 9.8 10*3/uL (ref 4.0–10.5)

## 2014-08-12 LAB — PROTIME-INR
INR: 2.32 — ABNORMAL HIGH (ref 0.00–1.49)
Prothrombin Time: 25.6 seconds — ABNORMAL HIGH (ref 11.6–15.2)

## 2014-08-12 LAB — I-STAT CHEM 8, ED
BUN: 17 mg/dL (ref 6–23)
CALCIUM ION: 1.29 mmol/L (ref 1.13–1.30)
CREATININE: 1.1 mg/dL (ref 0.50–1.35)
Chloride: 100 mEq/L (ref 96–112)
Glucose, Bld: 108 mg/dL — ABNORMAL HIGH (ref 70–99)
HCT: 47 % (ref 39.0–52.0)
HEMOGLOBIN: 16 g/dL (ref 13.0–17.0)
Potassium: 3.6 mEq/L — ABNORMAL LOW (ref 3.7–5.3)
Sodium: 142 mEq/L (ref 137–147)
TCO2: 27 mmol/L (ref 0–100)

## 2014-08-12 LAB — ABO/RH: ABO/RH(D): B POS

## 2014-08-12 MED ORDER — ONDANSETRON HCL 4 MG/2ML IJ SOLN
4.0000 mg | Freq: Once | INTRAMUSCULAR | Status: AC
  Administered 2014-08-12: 4 mg via INTRAVENOUS
  Filled 2014-08-12: qty 2

## 2014-08-12 MED ORDER — PROTHROMBIN COMPLEX CONC HUMAN 500 UNITS IV KIT: 25.0000 [IU]/kg | PACK | Status: DC

## 2014-08-12 MED ORDER — IOHEXOL 300 MG/ML  SOLN
100.0000 mL | Freq: Once | INTRAMUSCULAR | Status: AC | PRN
  Administered 2014-08-12: 100 mL via INTRAVENOUS

## 2014-08-12 MED ORDER — SODIUM CHLORIDE 0.9 % IV SOLN: Freq: Once | INTRAVENOUS | Status: DC

## 2014-08-12 MED ORDER — SODIUM CHLORIDE 0.9 % IV SOLN
1000.0000 mL | Freq: Once | INTRAVENOUS | Status: AC
  Administered 2014-08-12: 1000 mL via INTRAVENOUS

## 2014-08-12 MED ORDER — HYDROMORPHONE HCL 1 MG/ML IJ SOLN
1.0000 mg | Freq: Once | INTRAMUSCULAR | Status: AC
  Administered 2014-08-12: 1 mg via INTRAVENOUS
  Filled 2014-08-12: qty 1

## 2014-08-12 MED ORDER — SODIUM CHLORIDE 0.9 % IV SOLN: 1000.0000 mL | INTRAVENOUS | Status: DC

## 2014-08-12 MED ORDER — VITAMIN K1 10 MG/ML IJ SOLN
10.0000 mg | INTRAVENOUS | Status: AC
  Administered 2014-08-12: 10 mg via INTRAVENOUS
  Filled 2014-08-12: qty 1

## 2014-08-12 NOTE — ED Notes (Signed)
Dr. Campos at bedside   

## 2014-08-12 NOTE — Consult Note (Signed)
Reason for Consult:status post fall approximately 10 feet, questionable syncopal episode Referring Physician: Dr. Gregery Na Alex Maddox is an 63 y.o. male.  HPI: the patient is a 63 year old male who came in an secondary to a fall from a ladder. Patient states that he was climbing a ladder to help clean gutters and fell. Patient is not able to remember the episode. According to the patient's wife she states that he has had previous episodes of syncope. Patient required a pacemaker secondary to sinus node dysfunction. Patient also has had recent TIAs and is on Coumadin.  On EGD evaluation patient underwent CT scan which revealed a occipital IPH as well as some left-sided rib fractures. Secondary to the patient's trauma and rib fractures and trauma surgery was consult.  Past Medical History  Diagnosis Date  . Hypertension   . Stroke   . Paroxysmal atrial fibrillation   . Dyslipidemia   . Tachycardia-bradycardia   . Sinus node dysfunction   . Syncope     2D Echocardiogram 09/27/2010 EF between 40 to 45%  . Transient ischemic attack (TIA) 06/07  . Sleep apnea, obstructive     sleep study 10/09/2007  AHI 5.73/hr  REM AHI 11.90/hr   . Abnormal echocardiogram     stress myoview 10/29/2010    Past Surgical History  Procedure Laterality Date  . Pacemaker insertion  05/30/2011    [employer]tronic Revo   last checked 11/07/2012  . Loop recorder implant  02/02/2011    [employer]tronic- CRM     Family History  Problem Relation Age of Onset  . Heart attack Father 71    deceased from heart attack    Social History:  reports that he has never smoked. He does not have any smokeless tobacco history on file. He reports that he does not drink alcohol or use illicit drugs.  Allergies: No Known Allergies  Medications: :  (Not in a hospital admission)  Results for orders placed or performed during the hospital encounter of 08/12/14 (from the past 48 hour(s))  CBC with Differential     Status: Abnormal    Collection Time: 08/12/14  6:20 PM  Result Value Ref Range   WBC 9.8 4.0 - 10.5 K/uL   RBC 5.02 4.22 - 5.81 MIL/uL   Hemoglobin 15.9 13.0 - 17.0 g/dL   HCT 45.7 39.0 - 52.0 %   MCV 91.0 78.0 - 100.0 fL   MCH 31.7 26.0 - 34.0 pg   MCHC 34.8 30.0 - 36.0 g/dL   RDW 12.4 11.5 - 15.5 %   Platelets 136 (L) 150 - 400 K/uL   Neutrophils Relative % 83 (H) 43 - 77 %   Neutro Abs 8.1 (H) 1.7 - 7.7 K/uL   Lymphocytes Relative 9 (L) 12 - 46 %   Lymphs Abs 0.9 0.7 - 4.0 K/uL   Monocytes Relative 7 3 - 12 %   Monocytes Absolute 0.6 0.1 - 1.0 K/uL   Eosinophils Relative 1 0 - 5 %   Eosinophils Absolute 0.1 0.0 - 0.7 K/uL   Basophils Relative 0 0 - 1 %   Basophils Absolute 0.0 0.0 - 0.1 K/uL  Comprehensive metabolic panel     Status: Abnormal   Collection Time: 08/12/14  6:20 PM  Result Value Ref Range   Sodium 141 137 - 147 mEq/L   Potassium 3.8 3.7 - 5.3 mEq/L   Chloride 103 96 - 112 mEq/L   CO2 28 19 - 32 mEq/L   Glucose, Bld 101 (  H) 70 - 99 mg/dL   BUN 16 6 - 23 mg/dL   Creatinine, Ser 1.06 0.50 - 1.35 mg/dL   Calcium 10.0 8.4 - 10.5 mg/dL   Total Protein 6.9 6.0 - 8.3 g/dL   Albumin 3.9 3.5 - 5.2 g/dL   AST 33 0 - 37 U/L    Comment: HEMOLYSIS AT THIS LEVEL MAY AFFECT RESULT   ALT 34 0 - 53 U/L   Alkaline Phosphatase 57 39 - 117 U/L   Total Bilirubin 0.6 0.3 - 1.2 mg/dL   GFR calc non Af Amer 73 (L) >90 mL/min   GFR calc Af Amer 85 (L) >90 mL/min    Comment: (NOTE) The eGFR has been calculated using the CKD EPI equation. This calculation has not been validated in all clinical situations. eGFR's persistently <90 mL/min signify possible Chronic Kidney Disease.    Anion gap 10 5 - 15  Protime-INR     Status: Abnormal   Collection Time: 08/12/14  6:20 PM  Result Value Ref Range   Prothrombin Time 25.6 (H) 11.6 - 15.2 seconds   INR 2.32 (H) 0.00 - 1.49  I-Stat Chem 8, ED     Status: Abnormal   Collection Time: 08/12/14  7:22 PM  Result Value Ref Range   Sodium 142 137 - 147  mEq/L   Potassium 3.6 (L) 3.7 - 5.3 mEq/L   Chloride 100 96 - 112 mEq/L   BUN 17 6 - 23 mg/dL   Creatinine, Ser 1.10 0.50 - 1.35 mg/dL   Glucose, Bld 108 (H) 70 - 99 mg/dL   Calcium, Ion 1.29 1.13 - 1.30 mmol/L   TCO2 27 0 - 100 mmol/L   Hemoglobin 16.0 13.0 - 17.0 g/dL   HCT 47.0 39.0 - 52.0 %  ABO/Rh     Status: None   Collection Time: 08/12/14  8:54 PM  Result Value Ref Range   ABO/RH(D) B POS   Prepare fresh frozen plasma     Status: None (Preliminary result)   Collection Time: 08/12/14 10:00 PM  Result Value Ref Range   Unit Number [id number]    Blood Component Type THAWED PLASMA    Unit division 00    Status of Unit ISSUED    Transfusion Status OK TO TRANSFUSE    Unit Number [id number]    Blood Component Type THAWED PLASMA    Unit division 00    Status of Unit ISSUED    Transfusion Status OK TO TRANSFUSE     Ct Head Wo Contrast  08/12/2014   CLINICAL DATA:  Trauma. Fall. Head injury. Anticoagulated patient. Initial encounter.  EXAM: CT HEAD WITHOUT CONTRAST  CT CERVICAL SPINE WITHOUT CONTRAST  TECHNIQUE: Multidetector CT imaging of the head and cervical spine was performed following the standard protocol without intravenous contrast. Multiplanar CT image reconstructions of the cervical spine were also generated.  COMPARISON:  11/02/2012 head CT.  FINDINGS: CT HEAD FINDINGS  There is a small parenchymal hemorrhage in the RIGHT occipital lobe that measures 2.2 cm x 1.5 cm. Small amount of surrounding edema. In the setting of trauma and anti coagulation, this is most compatible with hemorrhagic parenchymal contusion. Scattered lacunar infarcts are present along with mild atrophy and chronic ischemic white matter disease. Posterior fossa structures appear within normal limits. There is no hydrocephalus or intraventricular blood.  Mastoid air cells are clear. No midline shift. No mass lesion. The calvarium is intact.  CT CERVICAL SPINE FINDINGS  Cervical spinal alignment is  within   normal limits. Prevertebral soft tissues are normal. C5-C6 ACDF with solid fusion. Odontoid intact. Occipital condyles appear intact. Vertebral artery atherosclerosis incidentally noted. Lung apices show dependent atelectasis. Dual lead RIGHT subclavian cardiac pacemaker leads partially visualized.  IMPRESSION: 1. Small RIGHT occipital lobe parenchymal hemorrhage, most compatible with hemorrhagic contusion. 2. No acute abnormality in the cervical spine. Solid C5-C6 fusion with ACDF plate.   Electronically Signed   By: Dereck Ligas M.D.   On: 08/12/2014 20:18   Ct Cervical Spine Wo Contrast  08/12/2014   CLINICAL DATA:  Trauma. Fall. Head injury. Anticoagulated patient. Initial encounter.  EXAM: CT HEAD WITHOUT CONTRAST  CT CERVICAL SPINE WITHOUT CONTRAST  TECHNIQUE: Multidetector CT imaging of the head and cervical spine was performed following the standard protocol without intravenous contrast. Multiplanar CT image reconstructions of the cervical spine were also generated.  COMPARISON:  11/02/2012 head CT.  FINDINGS: CT HEAD FINDINGS  There is a small parenchymal hemorrhage in the RIGHT occipital lobe that measures 2.2 cm x 1.5 cm. Small amount of surrounding edema. In the setting of trauma and anti coagulation, this is most compatible with hemorrhagic parenchymal contusion. Scattered lacunar infarcts are present along with mild atrophy and chronic ischemic white matter disease. Posterior fossa structures appear within normal limits. There is no hydrocephalus or intraventricular blood.  Mastoid air cells are clear. No midline shift. No mass lesion. The calvarium is intact.  CT CERVICAL SPINE FINDINGS  Cervical spinal alignment is within normal limits. Prevertebral soft tissues are normal. C5-C6 ACDF with solid fusion. Odontoid intact. Occipital condyles appear intact. Vertebral artery atherosclerosis incidentally noted. Lung apices show dependent atelectasis. Dual lead RIGHT subclavian cardiac  pacemaker leads partially visualized.  IMPRESSION: 1. Small RIGHT occipital lobe parenchymal hemorrhage, most compatible with hemorrhagic contusion. 2. No acute abnormality in the cervical spine. Solid C5-C6 fusion with ACDF plate.   Electronically Signed   By: Dereck Ligas M.D.   On: 08/12/2014 20:18   Ct Abdomen Pelvis W Contrast  08/12/2014   CLINICAL DATA:  63 year old male with history of trauma from a fall complaining of left-sided abdominal pain.  EXAM: CT ABDOMEN AND PELVIS WITH CONTRAST  TECHNIQUE: Multidetector CT imaging of the abdomen and pelvis was performed using the standard protocol following bolus administration of intravenous contrast.  CONTRAST:  19m OMNIPAQUE IOHEXOL 300 MG/ML  SOLN  COMPARISON:  CT of the abdomen 11/19/2009.  FINDINGS: Lower chest: 4 mm nodule in the right middle lobe (image 3 of series 205), unchanged compared to the prior examination; this can be considered benign and does not require imaging followup. Small amount of dependent atelectasis in the lower lobes of the lungs bilaterally. Atherosclerotic calcifications in the left anterior descending, left circumflex and right coronary arteries. Pacemaker leads terminating in the right atrial appendage and right ventricular apex.  Hepatobiliary: No focal cystic or solid hepatic lesions. No intra or extrahepatic biliary ductal dilatation. Gallbladder is normal in appearance. No signs of acute traumatic injury to the liver.  Pancreas: Unremarkable.  Spleen: No signs of acute traumatic injury to the spleen. Unremarkable in appearance.  Adrenals/Urinary Tract: Bilateral adrenal glands and bilateral kidneys are normal in appearance. Specifically, no evidence of acute traumatic injury to either kidney. No hydroureteronephrosis or perinephric stranding to suggest urinary tract obstruction at this time. Urinary bladder is normal in appearance.  Stomach/Bowel: Normal appearance of the stomach. No pathologic dilatation of small bowel  or colon. Normal appendix.  Vascular/Lymphatic: No signs of acute traumatic injury to the  major abdominal or pelvic vasculature. Fusiform ectasia of the infrarenal abdominal aorta which measures up to 2.8 x 2.5 cm. No pathologically enlarged lymph nodes are noted in the abdomen or pelvis.  Reproductive: Prostate gland is unremarkable in appearance.  Other: No high attenuation fluid collections within the peritoneal cavity or retroperitoneum to suggest significant posttraumatic hemorrhage. No significant volume of ascites. No pneumoperitoneum.  Musculoskeletal: No acute displaced fractures or aggressive appearing lytic or blastic lesions are noted in the visualized portions of the skeleton.  IMPRESSION: 1. No signs of significant acute traumatic injury to the abdomen or pelvis. 2. No acute findings. 3. Atherosclerosis, including multivessel coronary artery disease as well as fusiform ectasia of the infrarenal abdominal aorta which measures up to 2.8 x 2.5 cm. Assessment for potential risk factor modification, dietary therapy or pharmacologic therapy may be warranted, if clinically indicated. 4. Additional incidental findings, as above.   Electronically Signed   By: Daniel  Entrikin M.D.   On: 08/12/2014 20:20   Dg Chest Portable 1 View  08/12/2014   CLINICAL DATA:  Fell from ladder.  Landed on left side.  EXAM: PORTABLE CHEST - 1 VIEW  COMPARISON:  05/31/2011  FINDINGS: Right cardiac pacemaker. Prominent perihilar structures could represent vascular congestion. Specifically, there are increased densities in the right paratracheal region which are new. Surgical plate in the lower cervical spine. Negative for a pneumothorax. Heart size is normal.  IMPRESSION: Prominent perihilar structures, particularly in the right paratracheal region. Findings probably represent central vascular congestion. Consider follow-up to ensure resolution.   Electronically Signed   By: Adam  Henn M.D.   On: 08/12/2014 19:11   Dg Shoulder  Left  08/12/2014   CLINICAL DATA:  63-year-old male with history of trauma from a fall off a ladder onto a wood deck complaining of left shoulder pain.  EXAM: LEFT SHOULDER - 2+ VIEW  COMPARISON:  No priors.  FINDINGS: There is no evidence of fracture or dislocation. There is no evidence of arthropathy or other focal bone abnormality. Soft tissues are unremarkable. Orthopedic fixation hardware in the lower cervical spine incidentally noted.  IMPRESSION: Negative.   Electronically Signed   By: Daniel  Entrikin M.D.   On: 08/12/2014 19:30   Dg Hand Complete Left  08/12/2014   CLINICAL DATA:  Fall from ladder. Fall on wooden deck. LEFT hand pain. Initial encounter.  EXAM: LEFT HAND - COMPLETE 3+ VIEW  COMPARISON:  None.  FINDINGS: There is no evidence of fracture or dislocation. There is no evidence of arthropathy or other focal bone abnormality. Soft tissues are unremarkable. Patient was unable to remove ring from the ring finger.  IMPRESSION: Negative.   Electronically Signed   By: Geoffrey  Lamke M.D.   On: 08/12/2014 19:34    Review of Systems  Constitutional: Negative for weight loss.  HENT: Negative for ear discharge, ear pain, hearing loss and tinnitus.   Eyes: Negative for blurred vision, double vision, photophobia and pain.  Respiratory: Negative for cough, sputum production and shortness of breath.   Cardiovascular: Positive for chest pain (L sided).  Gastrointestinal: Negative for nausea, vomiting and abdominal pain.  Genitourinary: Negative for dysuria, urgency, frequency and flank pain.  Musculoskeletal: Positive for falls. Negative for myalgias, back pain, joint pain and neck pain.  Neurological: Negative for dizziness, tingling, sensory change, focal weakness, loss of consciousness and headaches.  Endo/Heme/Allergies: Does not bruise/bleed easily.  Psychiatric/Behavioral: Negative for depression, memory loss and substance abuse. The patient is not nervous/anxious.      Blood pressure  144/84, pulse 75, temperature 98.5 F (36.9 C), temperature source Oral, resp. rate 16, SpO2 93 %. Physical Exam  Vitals reviewed. Constitutional: He is oriented to person, place, and time. He appears well-developed and well-nourished. He is cooperative. No distress. Cervical collar and nasal cannula in place.  HENT:  Head: Normocephalic and atraumatic. Head is without raccoon's eyes, without Battle's sign, without abrasion, without contusion and without laceration.  Right Ear: Hearing, tympanic membrane, external ear and ear canal normal. No lacerations. No drainage or tenderness. No foreign bodies. Tympanic membrane is not perforated. No hemotympanum.  Left Ear: Hearing, tympanic membrane, external ear and ear canal normal. No lacerations. No drainage or tenderness. No foreign bodies. Tympanic membrane is not perforated. No hemotympanum.  Nose: Nose normal. No nose lacerations, sinus tenderness, nasal deformity or nasal septal hematoma. No epistaxis.  Mouth/Throat: Uvula is midline, oropharynx is clear and moist and mucous membranes are normal. No lacerations.  Eyes: Conjunctivae, EOM and lids are normal. Pupils are equal, round, and reactive to light. No scleral icterus.  Neck: Trachea normal and normal range of motion. Neck supple. No JVD present. No spinous process tenderness and no muscular tenderness present. Carotid bruit is not present. No thyromegaly present.  Cardiovascular: Normal rate, regular rhythm, normal heart sounds, intact distal pulses and normal pulses.   Respiratory: Effort normal and breath sounds normal. No respiratory distress. He exhibits tenderness (Left side throughout). He exhibits no bony tenderness, no laceration and no crepitus.  GI: Soft. Normal appearance. He exhibits no distension. Bowel sounds are decreased. There is no tenderness. There is no rigidity, no rebound, no guarding and no CVA tenderness.  Musculoskeletal: Normal range of motion. He exhibits no edema or  tenderness.  Lymphadenopathy:    He has no cervical adenopathy.  Neurological: He is alert and oriented to person, place, and time. He has normal strength. No cranial nerve deficit or sensory deficit. GCS eye subscore is 4. GCS verbal subscore is 5. GCS motor subscore is 6.  Skin: Skin is warm, dry and intact. He is not diaphoretic.  Psychiatric: He has a normal mood and affect. His speech is normal and behavior is normal.    Assessment/Plan: 63 year old male status post fall from ladder.  1. Left-sided rib fractures 2. IPH 3. Syncopal episode likely causing his fall.  1. Secondary to his rib fractures will proceed with pain management, encourage IS 2. Dr. Ralene Ok with neurosurgery will evaluate the patient Avers IPH. 3. Patient to undergo syncopal workup as per medicine.  Rosario Jacks., Lochlann Mastrangelo 08/12/2014, 10:37 PM

## 2014-08-12 NOTE — ED Notes (Signed)
Per EMS: Pt fell from ladder, roughly 5-8 feet, landed on left side. Complains of left sided neck and rib pain. Hx of syncope, pacer and coumadin. Pt states:  He was attempting to climb ladder to remove leaves from gutter. Does not remember fall, remembers waking up on ground.

## 2014-08-12 NOTE — ED Provider Notes (Addendum)
CSN: [id number]     Arrival date & time 08/12/14  1639 History   First MD Initiated Contact with Patient 08/12/14 1641     Chief Complaint  Patient presents with  . Fall      HPI Patient presents to the emergency department after falling from a ladder while trying to clean the gutters.  Patient has a history of what sounds like orthostatic hypotension with intermittent episodes of passing out.  This was an unwitnessed fall.  He reports mild left-sided neck pain at this time.  He denies weakness of his arms or legs.  He reports moderate to severe pain of his left lateral chest and left abdomen.  He also reports some pain to his left shoulder left hand.  He is on Coumadin for history of paroxysmal atrial fibrillation.  He states recently they have had some issues controlling his Coumadin but reports that he has been somewhat subtherapeutic.  Patient denies significant headache at this time.  No altered mental status.   Past Medical History  Diagnosis Date  . Hypertension   . Stroke   . Paroxysmal atrial fibrillation   . Dyslipidemia   . Tachycardia-bradycardia   . Sinus node dysfunction   . Syncope     2D Echocardiogram 09/27/2010 EF between 40 to 45%  . Transient ischemic attack (TIA) 06/07  . Sleep apnea, obstructive     sleep study 10/09/2007  AHI 5.73/hr  REM AHI 11.90/hr   . Abnormal echocardiogram     stress myoview 10/29/2010   Past Surgical History  Procedure Laterality Date  . Pacemaker insertion  05/30/2011    Medtronic Revo   last checked 11/07/2012  . Loop recorder implant  02/02/2011    Medtronic- CRM    Family History  Problem Relation Age of Onset  . Heart attack Father 51    deceased from heart attack   History  Substance Use Topics  . Smoking status: Never Smoker   . Smokeless tobacco: Not on file  . Alcohol Use: No    Review of Systems  All other systems reviewed and are negative.     Allergies  Review of patient's allergies indicates no known  allergies.  Home Medications   Prior to Admission medications   Medication Sig Start Date End Date Taking? Authorizing Provider  carvedilol (COREG) 25 MG tablet Take 1 tablet (25 mg total) by mouth 2 (two) times daily. 04/08/14  Yes Brittainy Erie Noe, PA-C  fish oil-omega-3 fatty acids 1000 MG capsule Take 1 g by mouth daily.   Yes Historical Provider, MD  Multiple Vitamin (MULTIVITAMIN) tablet Take 1 tablet by mouth daily.   Yes Historical Provider, MD  saw palmetto 160 MG capsule Take 160 mg by mouth daily.   Yes Historical Provider, MD  sildenafil (VIAGRA) 100 MG tablet Take 1 tablet (100 mg total) by mouth daily as needed for erectile dysfunction. 08/23/13  Yes Mihai Croitoru, MD  simvastatin (ZOCOR) 40 MG tablet TAKE ONE (1) TABLET AT BEDTIME 02/25/14  Yes Mihai Croitoru, MD  valsartan (DIOVAN) 320 MG tablet Take 320 mg by mouth daily.   Yes Historical Provider, MD  warfarin (COUMADIN) 5 MG tablet Take 2.5-5 mg by mouth daily at 6 PM. Takes 2.5mg  on Fri only Takes 5mg  all other days   Yes Historical Provider, MD  FLUVIRIN SUSP Inject 1 application into the muscle once. 07/24/14   Historical Provider, MD  warfarin (COUMADIN) 5 MG tablet TAKE ONE (1) TABLET BY MOUTH EVERY  DAY OR AS DIRECTED 04/22/14   Mihai Croitoru, MD   BP 175/98 mmHg  Pulse 59  Temp(Src) 99.5 F (37.5 C) (Oral)  Resp 16  SpO2 95% Physical Exam  Constitutional: He is oriented to person, place, and time. He appears well-developed and well-nourished.  HENT:  Head: Normocephalic and atraumatic.  Eyes: EOM are normal.  Neck:  Mild cervical and paracervical tenderness.  C-spine immobilized in a cervical collar.  Cardiovascular: Normal rate, regular rhythm, normal heart sounds and intact distal pulses.   Pulmonary/Chest: Effort normal and breath sounds normal. No respiratory distress.  Abdominal: Soft. He exhibits no distension. There is no tenderness.  Musculoskeletal: Normal range of motion.  Full range of motion  of bilateral ankles knees and hips.  Full range of motion bilateral wrists elbows and shoulders.  Mild pain with range of motion of left shoulder.  Mild tenderness of the left pain or eminence without focal abnormality.  5 out of 5 grip strength bilaterally and 5 out of 5 strength in his bilateral lower extremity major muscle groups.  Neurological: He is alert and oriented to person, place, and time.  Skin: Skin is warm and dry.  Psychiatric: He has a normal mood and affect. Judgment normal.  Nursing note and vitals reviewed.   ED Course  Procedures (including critical care time) CRITICAL CARE Performed by: Hoy Morn Total critical care time: 33 Critical care time was exclusive of separately billable procedures and treating other patients. Critical care was necessary to treat or prevent imminent or life-threatening deterioration. Critical care was time spent personally by me on the following activities: development of treatment plan with patient and/or surrogate as well as nursing, discussions with consultants, evaluation of patient's response to treatment, examination of patient, obtaining history from patient or surrogate, ordering and performing treatments and interventions, ordering and review of laboratory studies, ordering and review of radiographic studies, pulse oximetry and re-evaluation of patient's condition.   Labs Review Labs Reviewed  CBC WITH DIFFERENTIAL - Abnormal; Notable for the following:    Platelets 136 (*)    Neutrophils Relative % 83 (*)    Neutro Abs 8.1 (*)    Lymphocytes Relative 9 (*)    All other components within normal limits  COMPREHENSIVE METABOLIC PANEL - Abnormal; Notable for the following:    Glucose, Bld 101 (*)    GFR calc non Af Amer 73 (*)    GFR calc Af Amer 85 (*)    All other components within normal limits  PROTIME-INR - Abnormal; Notable for the following:    Prothrombin Time 25.6 (*)    INR 2.32 (*)    All other components within  normal limits  I-STAT CHEM 8, ED - Abnormal; Notable for the following:    Potassium 3.6 (*)    Glucose, Bld 108 (*)    All other components within normal limits  PREPARE FRESH FROZEN PLASMA  ABO/RH    Imaging Review Ct Head Wo Contrast  08/12/2014   CLINICAL DATA:  Trauma. Fall. Head injury. Anticoagulated patient. Initial encounter.  EXAM: CT HEAD WITHOUT CONTRAST  CT CERVICAL SPINE WITHOUT CONTRAST  TECHNIQUE: Multidetector CT imaging of the head and cervical spine was performed following the standard protocol without intravenous contrast. Multiplanar CT image reconstructions of the cervical spine were also generated.  COMPARISON:  11/02/2012 head CT.  FINDINGS: CT HEAD FINDINGS  There is a small parenchymal hemorrhage in the RIGHT occipital lobe that measures 2.2 cm x 1.5 cm. Small amount  of surrounding edema. In the setting of trauma and anti coagulation, this is most compatible with hemorrhagic parenchymal contusion. Scattered lacunar infarcts are present along with mild atrophy and chronic ischemic white matter disease. Posterior fossa structures appear within normal limits. There is no hydrocephalus or intraventricular blood.  Mastoid air cells are clear. No midline shift. No mass lesion. The calvarium is intact.  CT CERVICAL SPINE FINDINGS  Cervical spinal alignment is within normal limits. Prevertebral soft tissues are normal. C5-C6 ACDF with solid fusion. Odontoid intact. Occipital condyles appear intact. Vertebral artery atherosclerosis incidentally noted. Lung apices show dependent atelectasis. Dual lead RIGHT subclavian cardiac pacemaker leads partially visualized.  IMPRESSION: 1. Small RIGHT occipital lobe parenchymal hemorrhage, most compatible with hemorrhagic contusion. 2. No acute abnormality in the cervical spine. Solid C5-C6 fusion with ACDF plate.   Electronically Signed   By: Dereck Ligas M.D.   On: 08/12/2014 20:18   Ct Chest Wo Contrast  08/12/2014   CLINICAL DATA:  Fall and  complains of left rib pain.  EXAM: CT CHEST WITHOUT CONTRAST  TECHNIQUE: Multidetector CT imaging of the chest was performed following the standard protocol without IV contrast.  COMPARISON:  CT 10/05/2011  FINDINGS: Ascending thoracic aorta is enlarged measuring 4.2 cm and similar to the previous examination. Coronary arteries are heavily calcified. Patient has a cardiac pacemaker. No evidence for chest lymphadenopathy. No significant pericardial or pleural fluid. There is contrast in the renal collecting systems consistent with a recent abdominal CT. No acute abnormality in the upper abdomen.  The trachea and mainstem bronchi are patent. There are patchy densities in the lower lobes bilaterally. Findings could represent atelectasis or dependent edema. Stable 4 mm nodule in the right middle lobe on sequence 3, image 31. This is likely benign based on the stability. There is also a stable 4 mm nodule in the right upper lobe. Punctate nodule in the left upper lobe on sequence 3, image 18 appears stable.  Shoulders are located bilaterally. There is a nondisplaced fracture of the left posterior ninth rib. Nondisplaced fracture of the left posterior tenth rib. Minimally displaced fracture of the left eleventh rib. There is also a fracture involving the left twelfth rib.  IMPRESSION: Fractures of the left ninth, tenth, eleventh and twelfth ribs. Negative for a pneumothorax.  Densities throughout the lower lobes bilaterally. Findings could represent a combination of atelectasis and/or dependent edema.  Stable small pulmonary nodules as described.  Stable enlargement of the ascending thoracic aorta measuring up to 4.2 cm. Recommend surveillance of the ascending thoracic aorta.  Coronary artery calcifications.   Electronically Signed   By: Markus Daft M.D.   On: 08/12/2014 22:42   Ct Cervical Spine Wo Contrast  08/12/2014   CLINICAL DATA:  Trauma. Fall. Head injury. Anticoagulated patient. Initial encounter.  EXAM: CT  HEAD WITHOUT CONTRAST  CT CERVICAL SPINE WITHOUT CONTRAST  TECHNIQUE: Multidetector CT imaging of the head and cervical spine was performed following the standard protocol without intravenous contrast. Multiplanar CT image reconstructions of the cervical spine were also generated.  COMPARISON:  11/02/2012 head CT.  FINDINGS: CT HEAD FINDINGS  There is a small parenchymal hemorrhage in the RIGHT occipital lobe that measures 2.2 cm x 1.5 cm. Small amount of surrounding edema. In the setting of trauma and anti coagulation, this is most compatible with hemorrhagic parenchymal contusion. Scattered lacunar infarcts are present along with mild atrophy and chronic ischemic white matter disease. Posterior fossa structures appear within normal limits. There is no  hydrocephalus or intraventricular blood.  Mastoid air cells are clear. No midline shift. No mass lesion. The calvarium is intact.  CT CERVICAL SPINE FINDINGS  Cervical spinal alignment is within normal limits. Prevertebral soft tissues are normal. C5-C6 ACDF with solid fusion. Odontoid intact. Occipital condyles appear intact. Vertebral artery atherosclerosis incidentally noted. Lung apices show dependent atelectasis. Dual lead RIGHT subclavian cardiac pacemaker leads partially visualized.  IMPRESSION: 1. Small RIGHT occipital lobe parenchymal hemorrhage, most compatible with hemorrhagic contusion. 2. No acute abnormality in the cervical spine. Solid C5-C6 fusion with ACDF plate.   Electronically Signed   By: Dereck Ligas M.D.   On: 08/12/2014 20:18   Ct Abdomen Pelvis W Contrast  08/12/2014   CLINICAL DATA:  63 year old male with history of trauma from a fall complaining of left-sided abdominal pain.  EXAM: CT ABDOMEN AND PELVIS WITH CONTRAST  TECHNIQUE: Multidetector CT imaging of the abdomen and pelvis was performed using the standard protocol following bolus administration of intravenous contrast.  CONTRAST:  119mL OMNIPAQUE IOHEXOL 300 MG/ML  SOLN   COMPARISON:  CT of the abdomen 11/19/2009.  FINDINGS: Lower chest: 4 mm nodule in the right middle lobe (image 3 of series 205), unchanged compared to the prior examination; this can be considered benign and does not require imaging followup. Small amount of dependent atelectasis in the lower lobes of the lungs bilaterally. Atherosclerotic calcifications in the left anterior descending, left circumflex and right coronary arteries. Pacemaker leads terminating in the right atrial appendage and right ventricular apex.  Hepatobiliary: No focal cystic or solid hepatic lesions. No intra or extrahepatic biliary ductal dilatation. Gallbladder is normal in appearance. No signs of acute traumatic injury to the liver.  Pancreas: Unremarkable.  Spleen: No signs of acute traumatic injury to the spleen. Unremarkable in appearance.  Adrenals/Urinary Tract: Bilateral adrenal glands and bilateral kidneys are normal in appearance. Specifically, no evidence of acute traumatic injury to either kidney. No hydroureteronephrosis or perinephric stranding to suggest urinary tract obstruction at this time. Urinary bladder is normal in appearance.  Stomach/Bowel: Normal appearance of the stomach. No pathologic dilatation of small bowel or colon. Normal appendix.  Vascular/Lymphatic: No signs of acute traumatic injury to the major abdominal or pelvic vasculature. Fusiform ectasia of the infrarenal abdominal aorta which measures up to 2.8 x 2.5 cm. No pathologically enlarged lymph nodes are noted in the abdomen or pelvis.  Reproductive: Prostate gland is unremarkable in appearance.  Other: No high attenuation fluid collections within the peritoneal cavity or retroperitoneum to suggest significant posttraumatic hemorrhage. No significant volume of ascites. No pneumoperitoneum.  Musculoskeletal: No acute displaced fractures or aggressive appearing lytic or blastic lesions are noted in the visualized portions of the skeleton.  IMPRESSION: 1. No  signs of significant acute traumatic injury to the abdomen or pelvis. 2. No acute findings. 3. Atherosclerosis, including multivessel coronary artery disease as well as fusiform ectasia of the infrarenal abdominal aorta which measures up to 2.8 x 2.5 cm. Assessment for potential risk factor modification, dietary therapy or pharmacologic therapy may be warranted, if clinically indicated. 4. Additional incidental findings, as above.   Electronically Signed   By: Vinnie Langton M.D.   On: 08/12/2014 20:20   Dg Chest Portable 1 View  08/12/2014   CLINICAL DATA:  [place] from ladder.  Landed on left side.  EXAM: PORTABLE CHEST - 1 VIEW  COMPARISON:  05/31/2011  FINDINGS: Right cardiac pacemaker. Prominent perihilar structures could represent vascular congestion. Specifically, there are increased densities in the  right paratracheal region which are new. Surgical plate in the lower cervical spine. Negative for a pneumothorax. Heart size is normal.  IMPRESSION: Prominent perihilar structures, particularly in the right paratracheal region. Findings probably represent central vascular congestion. Consider follow-up to ensure resolution.   Electronically Signed   By: Markus Daft M.D.   On: 08/12/2014 19:11   Dg Shoulder Left  08/12/2014   CLINICAL DATA:  63 year old male with history of trauma from a fall off a ladder onto a wood deck complaining of left shoulder pain.  EXAM: LEFT SHOULDER - 2+ VIEW  COMPARISON:  No priors.  FINDINGS: There is no evidence of fracture or dislocation. There is no evidence of arthropathy or other focal bone abnormality. Soft tissues are unremarkable. Orthopedic fixation hardware in the lower cervical spine incidentally noted.  IMPRESSION: Negative.   Electronically Signed   By: Vinnie Langton M.D.   On: 08/12/2014 19:30   Dg Hand Complete Left  08/12/2014   CLINICAL DATA:  Fall from ladder. Fall on wooden deck. LEFT hand pain. Initial encounter.  EXAM: LEFT HAND - COMPLETE 3+ VIEW   COMPARISON:  None.  FINDINGS: There is no evidence of fracture or dislocation. There is no evidence of arthropathy or other focal bone abnormality. Soft tissues are unremarkable. Patient was unable to remove ring from the ring finger.  IMPRESSION: Negative.   Electronically Signed   By: Dereck Ligas M.D.   On: 08/12/2014 19:34  I personally reviewed the imaging tests through PACS system I reviewed available ER/hospitalization records through the EMR    EKG Interpretation None      MDM   Final diagnoses:  Chest pain  Fall  Intracranial bleed  Anticoagulated on Coumadin  Rib fractures, left, closed, initial encounter  Syncope, unspecified syncope type   I spoke with neurosurgery (Dr Kathyrn Sheriff) regarding the right occipital parenchymal hemorrhage.  Agree with admission.  Agree with reversal of Coumadin at this time with vitamin K and FFP.  They do not believe the patient needs Kcentra.  Given the patient's ongoing severe left lateral chest pain I suspect he has nondisplaced left-sided rib fractures.  CT scan will be performed.  I had a brief discussion with trauma surgery (Dr Rosendo Gros) who will evaluate him after the CT of his chest.  Patient will benefit from hospitalization for observation of his intercranial bleed which will likely resolve reversal of his anticoagulation.  He will also need pulmonary toilet and pain management  Trauma team was concerned about the patient's past medical history and the syncope that led to his fall today.  It's unclear if this was syncope versus mechanical fall while on the latter.  Patient has a Medtronic pacemaker.  This will be interrogated at this time.  Hospitalist admission.  Admit to stepdown.  FFP going in now  Hoy Morn, MD 08/12/14 3149  Hoy Morn, MD 08/12/14 [phone]

## 2014-08-13 ENCOUNTER — Inpatient Hospital Stay (HOSPITAL_COMMUNITY): Payer: BC Managed Care – PPO

## 2014-08-13 ENCOUNTER — Encounter (HOSPITAL_COMMUNITY): Payer: Self-pay | Admitting: Internal Medicine

## 2014-08-13 DIAGNOSIS — S2232XA Fracture of one rib, left side, initial encounter for closed fracture: Secondary | ICD-10-CM

## 2014-08-13 DIAGNOSIS — I629 Nontraumatic intracranial hemorrhage, unspecified: Secondary | ICD-10-CM

## 2014-08-13 DIAGNOSIS — Z7901 Long term (current) use of anticoagulants: Secondary | ICD-10-CM

## 2014-08-13 DIAGNOSIS — R55 Syncope and collapse: Secondary | ICD-10-CM | POA: Diagnosis present

## 2014-08-13 DIAGNOSIS — I4891 Unspecified atrial fibrillation: Secondary | ICD-10-CM

## 2014-08-13 DIAGNOSIS — Z5181 Encounter for therapeutic drug level monitoring: Secondary | ICD-10-CM

## 2014-08-13 LAB — CBC WITH DIFFERENTIAL/PLATELET
BASOS ABS: 0 10*3/uL (ref 0.0–0.1)
BASOS PCT: 0 % (ref 0–1)
Eosinophils Absolute: 0 10*3/uL (ref 0.0–0.7)
Eosinophils Relative: 0 % (ref 0–5)
HCT: 42.2 % (ref 39.0–52.0)
Hemoglobin: 14.6 g/dL (ref 13.0–17.0)
LYMPHS PCT: 12 % (ref 12–46)
Lymphs Abs: 1.1 10*3/uL (ref 0.7–4.0)
MCH: 31.3 pg (ref 26.0–34.0)
MCHC: 34.6 g/dL (ref 30.0–36.0)
MCV: 90.6 fL (ref 78.0–100.0)
Monocytes Absolute: 0.8 10*3/uL (ref 0.1–1.0)
Monocytes Relative: 9 % (ref 3–12)
NEUTROS ABS: 7 10*3/uL (ref 1.7–7.7)
NEUTROS PCT: 79 % — AB (ref 43–77)
Platelets: 123 10*3/uL — ABNORMAL LOW (ref 150–400)
RBC: 4.66 MIL/uL (ref 4.22–5.81)
RDW: 12.4 % (ref 11.5–15.5)
WBC: 9 10*3/uL (ref 4.0–10.5)

## 2014-08-13 LAB — PREPARE FRESH FROZEN PLASMA
UNIT DIVISION: 0
UNIT DIVISION: 0

## 2014-08-13 LAB — MRSA PCR SCREENING: MRSA by PCR: NEGATIVE

## 2014-08-13 LAB — COMPREHENSIVE METABOLIC PANEL
ALBUMIN: 3.5 g/dL (ref 3.5–5.2)
ALT: 28 U/L (ref 0–53)
ANION GAP: 11 (ref 5–15)
AST: 28 U/L (ref 0–37)
Alkaline Phosphatase: 53 U/L (ref 39–117)
BUN: 16 mg/dL (ref 6–23)
CO2: 25 mEq/L (ref 19–32)
CREATININE: 0.96 mg/dL (ref 0.50–1.35)
Calcium: 9.1 mg/dL (ref 8.4–10.5)
Chloride: 104 mEq/L (ref 96–112)
GFR calc non Af Amer: 87 mL/min — ABNORMAL LOW (ref >90)
Glucose, Bld: 114 mg/dL — ABNORMAL HIGH (ref 70–99)
Potassium: 3.8 mEq/L (ref 3.7–5.3)
Sodium: 140 mEq/L (ref 137–147)
Total Bilirubin: 0.9 mg/dL (ref 0.3–1.2)
Total Protein: 6.6 g/dL (ref 6.0–8.3)

## 2014-08-13 LAB — PROTIME-INR
INR: 1.58 — AB (ref 0.00–1.49)
INR: 1.46 (ref 0.00–1.49)
PROTHROMBIN TIME: 19 s — AB (ref 11.6–15.2)
Prothrombin Time: 17.9 seconds — ABNORMAL HIGH (ref 11.6–15.2)

## 2014-08-13 LAB — TROPONIN I: Troponin I: <0.3 ng/mL (ref <0.30)

## 2014-08-13 LAB — TSH: TSH: 0.464 u[IU]/mL (ref 0.350–4.500)

## 2014-08-13 MED ORDER — ACETAMINOPHEN 650 MG RE SUPP: 650.0000 mg | Freq: Four times a day (QID) | RECTAL | Status: DC | PRN

## 2014-08-13 MED ORDER — ONDANSETRON HCL 4 MG/2ML IJ SOLN: 4.0000 mg | Freq: Four times a day (QID) | INTRAMUSCULAR | Status: DC | PRN

## 2014-08-13 MED ORDER — IRBESARTAN 300 MG PO TABS
300.0000 mg | ORAL_TABLET | Freq: Every day | ORAL | Status: DC
  Administered 2014-08-13 – 2014-08-16 (×4): 300 mg via ORAL
  Filled 2014-08-13 (×4): qty 1

## 2014-08-13 MED ORDER — OMEGA-3-ACID ETHYL ESTERS 1 G PO CAPS
1.0000 g | ORAL_CAPSULE | Freq: Every day | ORAL | Status: DC
  Administered 2014-08-13 – 2014-08-16 (×4): 1 g via ORAL
  Filled 2014-08-13 (×4): qty 1

## 2014-08-13 MED ORDER — CARVEDILOL 12.5 MG PO TABS
25.0000 mg | ORAL_TABLET | Freq: Two times a day (BID) | ORAL | Status: DC
  Administered 2014-08-13 – 2014-08-16 (×7): 25 mg via ORAL
  Filled 2014-08-13 (×2): qty 1
  Filled 2014-08-13: qty 2
  Filled 2014-08-13: qty 1
  Filled 2014-08-13 (×2): qty 2
  Filled 2014-08-13 (×3): qty 1
  Filled 2014-08-13: qty 2

## 2014-08-13 MED ORDER — SIMVASTATIN 40 MG PO TABS
40.0000 mg | ORAL_TABLET | Freq: Every day | ORAL | Status: DC
  Administered 2014-08-13 – 2014-08-15 (×3): 40 mg via ORAL
  Filled 2014-08-13 (×4): qty 1

## 2014-08-13 MED ORDER — OXYCODONE HCL 5 MG PO TABS
5.0000 mg | ORAL_TABLET | ORAL | Status: DC | PRN
  Administered 2014-08-13: 5 mg via ORAL
  Administered 2014-08-14 – 2014-08-15 (×3): 10 mg via ORAL
  Administered 2014-08-16: 5 mg via ORAL
  Filled 2014-08-13: qty 1
  Filled 2014-08-13 (×2): qty 2
  Filled 2014-08-13: qty 1
  Filled 2014-08-13: qty 2

## 2014-08-13 MED ORDER — ONDANSETRON HCL 4 MG PO TABS: 4.0000 mg | ORAL_TABLET | Freq: Four times a day (QID) | ORAL | Status: DC | PRN

## 2014-08-13 MED ORDER — MORPHINE SULFATE 2 MG/ML IJ SOLN
1.0000 mg | INTRAMUSCULAR | Status: DC | PRN
  Administered 2014-08-13: 1 mg via INTRAVENOUS
  Filled 2014-08-13: qty 1

## 2014-08-13 MED ORDER — HYDROMORPHONE HCL 1 MG/ML IJ SOLN: 0.5000 mg | INTRAMUSCULAR | Status: DC | PRN

## 2014-08-13 MED ORDER — SODIUM CHLORIDE 0.9 % IV SOLN
INTRAVENOUS | Status: DC
  Administered 2014-08-13: 03:00:00 via INTRAVENOUS

## 2014-08-13 MED ORDER — HYDROMORPHONE HCL 1 MG/ML IJ SOLN
0.5000 mg | INTRAMUSCULAR | Status: DC | PRN
  Administered 2014-08-13: 1 mg via INTRAVENOUS
  Filled 2014-08-13: qty 1

## 2014-08-13 MED ORDER — ACETAMINOPHEN 325 MG PO TABS
650.0000 mg | ORAL_TABLET | Freq: Four times a day (QID) | ORAL | Status: DC | PRN
  Administered 2014-08-16 (×2): 650 mg via ORAL
  Filled 2014-08-13 (×2): qty 2

## 2014-08-13 NOTE — Plan of Care (Signed)
Problem: Consults Goal: General Medical Patient Education See Patient Education Module for specific education.  Outcome: Completed/Met Date Met:  08/13/14 Goal: Skin Care Protocol Initiated - if Braden Score 18 or less If consults are not indicated, leave blank or document N/A  Outcome: Not Applicable Date Met:  66/[birth year]  Problem: Phase I Progression Outcomes Goal: Voiding-avoid urinary catheter unless indicated Outcome: Completed/Met Date Met:  08/13/14

## 2014-08-13 NOTE — Progress Notes (Addendum)
[facility] TEAM 1 - Stepdown/ICU TEAM Progress Note  TRYSTON GILLIAM MLY:[id number] DOB: [birth year] DOA: 08/12/2014 PCP: Leonides Grills, MD  Admit HPI / Brief Narrative: 63 y.o. male with history of recurrent neurocardiogenic syncope and sinus node dysfunction status post pacemaker placement, paroxysmal atrial fibrillation on Coumadin, hypertension, hyperlipidemia and hypertrophic cardiomyopathy was brought to the ER after patient fell while trying to work on his house gutter. Patient was on his ladder when suddenly he lost consciousness and fell onto the deck. By the time patient's wife saw the patient he had regained consciousness.   In the ER CT head showed small occipital hemorrhage and rib fractures. On-call Neurosurgeon and Trauma Surgeons were consulted. Patient after falling did have some headache mostly in the frontal area but denied nausea vomiting or any weakness of the upper or lower extremities. Patient stated he did not have any prodrome before he lost his consciousness. As per the ER physician patient's pacemaker was interrogated and was unremarkable. Patient was on Coumadin and was therefore FFP and vitamin K was administered.  HPI/Subjective: Pt seen for a f/u visit  Assessment/Plan:  Syncope with history of sinus node dysfunction status post pacemaker placement Monitor on tele - this is a chronic issue - followed by Dr. Loletha Grayer w/ [facility] for this issue chronically - will ask them to f/u while the pt is hospitalized  Intracranial bleed F/u CT head notes stability   Rib fracture patient has been placed on incentive spirometer - Trauma Surgery following   History of paroxysmal atrial fibrillation continue beta blockers - NSR at present   Hypertension continue home medications.  Hyperlipidemia continue home medications.  Code Status: FULL Family Communication: no family present at time of exam Disposition Plan:  SDU  Consultants: NS Trauma  Procedures: none  Antibiotics: none  DVT prophylaxis: SCDs  Objective: Blood pressure 155/91, pulse 70, temperature 98 F (36.7 C), temperature source Oral, resp. rate 18, height 6\' 1"  (1.854 m), weight 86.183 kg (190 lb), SpO2 96 %.  Intake/Output Summary (Last 24 hours) at 08/13/14 1447 Last data filed at 08/13/14 1339  Gross per 24 hour  Intake    580 ml  Output    425 ml  Net    155 ml   Exam: F/U exam completed   Data Reviewed: Basic Metabolic Panel:  Recent Labs Lab 08/12/14 1820 08/12/14 1922 08/13/14 0241  NA 141 142 140  K 3.8 3.6* 3.8  CL 103 100 104  CO2 28  --  25  GLUCOSE 101* 108* 114*  BUN 16 17 16   CREATININE 1.06 1.10 0.96  CALCIUM 10.0  --  9.1    Liver Function Tests:  Recent Labs Lab 08/12/14 1820 08/13/14 0241  AST 33 28  ALT 34 28  ALKPHOS 57 53  BILITOT 0.6 0.9  PROT 6.9 6.6  ALBUMIN 3.9 3.5    Coags:  Recent Labs Lab 08/12/14 1820 08/13/14 0241 08/13/14 1020  INR 2.32* 1.58* 1.46    CBC:  Recent Labs Lab 08/12/14 1820 08/12/14 1922 08/13/14 0241  WBC 9.8  --  9.0  NEUTROABS 8.1*  --  7.0  HGB 15.9 16.0 14.6  HCT 45.7 47.0 42.2  MCV 91.0  --  90.6  PLT 136*  --  123*    Cardiac Enzymes:  Recent Labs Lab 08/13/14 0241 08/13/14 1020  TROPONINI <0.30 <0.30   Studies:  Recent x-ray studies have been reviewed in detail by the Attending Physician  Scheduled Meds:  Scheduled  Meds: . carvedilol  25 mg Oral BID  . irbesartan  300 mg Oral Daily  . omega-3 acid ethyl esters  1 g Oral Daily  . simvastatin  40 mg Oral q1800    Time spent on care of this patient: 25+ mins   Brigett Estell T , MD   [facility] Office  [phone] Pager - Text Page per Shea Evans as per below:  On-Call/Text Page:      [email or web address]      password [email or web address]  If 7PM-7AM, please contact night-coverage [email or web address] Password [email or web address] 08/13/2014, 2:47 PM   LOS: 1 day

## 2014-08-13 NOTE — ED Notes (Signed)
Ordered heart healthy diet.

## 2014-08-13 NOTE — H&P (Signed)
[facility] History and Physical  Alex Maddox:[id number] DOB: [birth year] DOA: 08/12/2014  Referring physician: ER physician. PCP: Leonides Grills, MD   Chief Complaint: Loss of consciousness.  HPI: Alex Maddox is a 63 y.o. male with history of recurrent neurocardiogenic syncope and sinus node dysfunction status post pacemaker placement, paroxysmal atrial fibrillation on Coumadin, hypertension, hyperlipidemia and hypertrophic cardiomyopathy was brought to the ER after patient fell while trying to work on his house gutter. Patient was on his ladder when suddenly he lost consciousness and fell onto the deck. By the time patient's wife saw the patient he regained consciousness. In the ER CT head shows small occipital hemorrhage and rib fractures. On-call neurosurgeon and trauma surgeons were consulted and patient has been admitted under medical service for syncope. Patient after falling did have some headache mostly in the frontal area but denies any nausea vomiting or any weakness of the upper or lower extremities. Patient states that he did not have any prodrome before he lost his consciousness. As per the ER physician patient's pacemaker was interrogated and was unremarkable. Patient is on Coumadin and has been held and her neurosurgeon has been was P and vitamin K due to intracranial bleed and plan is to have a repeat CT head in a.m.  Review of Systems: As presented in the history of presenting illness, rest negative.  Past Medical History  Diagnosis Date  . Hypertension   . Stroke   . Paroxysmal atrial fibrillation   . Dyslipidemia   . Tachycardia-bradycardia   . Sinus node dysfunction   . Syncope     2D Echocardiogram 09/27/2010 EF between 40 to 45%  . Transient ischemic attack (TIA) 06/07  . Sleep apnea, obstructive     sleep study 10/09/2007  AHI 5.73/hr  REM AHI 11.90/hr   . Abnormal echocardiogram     stress myoview 10/29/2010   Past Surgical History  Procedure  Laterality Date  . Pacemaker insertion  05/30/2011    Medtronic Revo   last checked 11/07/2012  . Loop recorder implant  02/02/2011    Medtronic- CRM    Social History:  reports that he has never smoked. He does not have any smokeless tobacco history on file. He reports that he does not drink alcohol or use illicit drugs. Where does patient live home. Can patient participate in ADLs? Yes.  No Known Allergies  Family History:  Family History  Problem Relation Age of Onset  . Heart attack Father 19    deceased from heart attack      Prior to Admission medications   Medication Sig Start Date End Date Taking? Authorizing Provider  carvedilol (COREG) 25 MG tablet Take 1 tablet (25 mg total) by mouth 2 (two) times daily. 04/08/14  Yes Brittainy Erie Noe, PA-C  fish oil-omega-3 fatty acids 1000 MG capsule Take 1 g by mouth daily.   Yes Historical Provider, MD  Multiple Vitamin (MULTIVITAMIN) tablet Take 1 tablet by mouth daily.   Yes Historical Provider, MD  saw palmetto 160 MG capsule Take 160 mg by mouth daily.   Yes Historical Provider, MD  sildenafil (VIAGRA) 100 MG tablet Take 1 tablet (100 mg total) by mouth daily as needed for erectile dysfunction. 08/23/13  Yes Mihai Croitoru, MD  simvastatin (ZOCOR) 40 MG tablet TAKE ONE (1) TABLET AT BEDTIME 02/25/14  Yes Mihai Croitoru, MD  valsartan (DIOVAN) 320 MG tablet Take 320 mg by mouth daily.   Yes Historical Provider, MD  warfarin (COUMADIN) 5  MG tablet Take 2.5-5 mg by mouth daily at 6 PM. Takes 2.5mg  on Fri only Takes 5mg  all other days   Yes Historical Provider, MD  FLUVIRIN SUSP Inject 1 application into the muscle once. 07/24/14   Historical Provider, MD  warfarin (COUMADIN) 5 MG tablet TAKE ONE (1) TABLET BY MOUTH EVERY DAY OR AS DIRECTED 04/22/14   Sanda Klein, MD    Physical Exam: Filed Vitals:   08/13/14 0131 08/13/14 0145 08/13/14 0200 08/13/14 0215  BP:  144/86 146/82 150/91  Pulse:  79 72 73  Temp:      TempSrc:       Resp:  21 15 14   SpO2: 96% 95% 94% 95%     General:  Well-developed well-nourished.  Eyes: anicteric no pallor.  ENT: no discharge from the ears eyes nose and mouth.  Neck: no mass felt.  Cardiovascular: S1-S2 heard.  Respiratory: no rhonchi or crepitations.  Abdomen: soft nontender bowel sounds present. No guarding or rigidity.  Skin: no rash.  Musculoskeletal: no edema.  Psychiatric: appears normal.  Neurologic: alert awake oriented to time place and person. Moves all extremities 5 x 5. No facial asymmetry. PERRLA positive. Tongue is midline.  Labs on Admission:  Basic Metabolic Panel:  Recent Labs Lab 08/12/14 1820 08/12/14 1922  NA 141 142  K 3.8 3.6*  CL 103 100  CO2 28  --   GLUCOSE 101* 108*  BUN 16 17  CREATININE 1.06 1.10  CALCIUM 10.0  --    Liver Function Tests:  Recent Labs Lab 08/12/14 1820  AST 33  ALT 34  ALKPHOS 57  BILITOT 0.6  PROT 6.9  ALBUMIN 3.9   No results for input(s): LIPASE, AMYLASE in the last 168 hours. No results for input(s): AMMONIA in the last 168 hours. CBC:  Recent Labs Lab 08/12/14 1820 08/12/14 1922  WBC 9.8  --   NEUTROABS 8.1*  --   HGB 15.9 16.0  HCT 45.7 47.0  MCV 91.0  --   PLT 136*  --    Cardiac Enzymes: No results for input(s): CKTOTAL, CKMB, CKMBINDEX, TROPONINI in the last 168 hours.  BNP (last 3 results) No results for input(s): PROBNP in the last 8760 hours. CBG: No results for input(s): GLUCAP in the last 168 hours.  Radiological Exams on Admission: Ct Head Wo Contrast  08/12/2014   CLINICAL DATA:  Trauma. Fall. Head injury. Anticoagulated patient. Initial encounter.  EXAM: CT HEAD WITHOUT CONTRAST  CT CERVICAL SPINE WITHOUT CONTRAST  TECHNIQUE: Multidetector CT imaging of the head and cervical spine was performed following the standard protocol without intravenous contrast. Multiplanar CT image reconstructions of the cervical spine were also generated.  COMPARISON:  11/02/2012 head CT.   FINDINGS: CT HEAD FINDINGS  There is a small parenchymal hemorrhage in the RIGHT occipital lobe that measures 2.2 cm x 1.5 cm. Small amount of surrounding edema. In the setting of trauma and anti coagulation, this is most compatible with hemorrhagic parenchymal contusion. Scattered lacunar infarcts are present along with mild atrophy and chronic ischemic white matter disease. Posterior fossa structures appear within normal limits. There is no hydrocephalus or intraventricular blood.  Mastoid air cells are clear. No midline shift. No mass lesion. The calvarium is intact.  CT CERVICAL SPINE FINDINGS  Cervical spinal alignment is within normal limits. Prevertebral soft tissues are normal. C5-C6 ACDF with solid fusion. Odontoid intact. Occipital condyles appear intact. Vertebral artery atherosclerosis incidentally noted. Lung apices show dependent atelectasis. Dual lead RIGHT  subclavian cardiac pacemaker leads partially visualized.  IMPRESSION: 1. Small RIGHT occipital lobe parenchymal hemorrhage, most compatible with hemorrhagic contusion. 2. No acute abnormality in the cervical spine. Solid C5-C6 fusion with ACDF plate.   Electronically Signed   By: Dereck Ligas M.D.   On: 08/12/2014 20:18   Ct Chest Wo Contrast  08/12/2014   CLINICAL DATA:  Fall and complains of left rib pain.  EXAM: CT CHEST WITHOUT CONTRAST  TECHNIQUE: Multidetector CT imaging of the chest was performed following the standard protocol without IV contrast.  COMPARISON:  CT 10/05/2011  FINDINGS: Ascending thoracic aorta is enlarged measuring 4.2 cm and similar to the previous examination. Coronary arteries are heavily calcified. Patient has a cardiac pacemaker. No evidence for chest lymphadenopathy. No significant pericardial or pleural fluid. There is contrast in the renal collecting systems consistent with a recent abdominal CT. No acute abnormality in the upper abdomen.  The trachea and mainstem bronchi are patent. There are patchy densities  in the lower lobes bilaterally. Findings could represent atelectasis or dependent edema. Stable 4 mm nodule in the right middle lobe on sequence 3, image 31. This is likely benign based on the stability. There is also a stable 4 mm nodule in the right upper lobe. Punctate nodule in the left upper lobe on sequence 3, image 18 appears stable.  Shoulders are located bilaterally. There is a nondisplaced fracture of the left posterior ninth rib. Nondisplaced fracture of the left posterior tenth rib. Minimally displaced fracture of the left eleventh rib. There is also a fracture involving the left twelfth rib.  IMPRESSION: Fractures of the left ninth, tenth, eleventh and twelfth ribs. Negative for a pneumothorax.  Densities throughout the lower lobes bilaterally. Findings could represent a combination of atelectasis and/or dependent edema.  Stable small pulmonary nodules as described.  Stable enlargement of the ascending thoracic aorta measuring up to 4.2 cm. Recommend surveillance of the ascending thoracic aorta.  Coronary artery calcifications.   Electronically Signed   By: Markus Daft M.D.   On: 08/12/2014 22:42   Ct Cervical Spine Wo Contrast  08/12/2014   CLINICAL DATA:  Trauma. Fall. Head injury. Anticoagulated patient. Initial encounter.  EXAM: CT HEAD WITHOUT CONTRAST  CT CERVICAL SPINE WITHOUT CONTRAST  TECHNIQUE: Multidetector CT imaging of the head and cervical spine was performed following the standard protocol without intravenous contrast. Multiplanar CT image reconstructions of the cervical spine were also generated.  COMPARISON:  11/02/2012 head CT.  FINDINGS: CT HEAD FINDINGS  There is a small parenchymal hemorrhage in the RIGHT occipital lobe that measures 2.2 cm x 1.5 cm. Small amount of surrounding edema. In the setting of trauma and anti coagulation, this is most compatible with hemorrhagic parenchymal contusion. Scattered lacunar infarcts are present along with mild atrophy and chronic ischemic  white matter disease. Posterior fossa structures appear within normal limits. There is no hydrocephalus or intraventricular blood.  Mastoid air cells are clear. No midline shift. No mass lesion. The calvarium is intact.  CT CERVICAL SPINE FINDINGS  Cervical spinal alignment is within normal limits. Prevertebral soft tissues are normal. C5-C6 ACDF with solid fusion. Odontoid intact. Occipital condyles appear intact. Vertebral artery atherosclerosis incidentally noted. Lung apices show dependent atelectasis. Dual lead RIGHT subclavian cardiac pacemaker leads partially visualized.  IMPRESSION: 1. Small RIGHT occipital lobe parenchymal hemorrhage, most compatible with hemorrhagic contusion. 2. No acute abnormality in the cervical spine. Solid C5-C6 fusion with ACDF plate.   Electronically Signed   By: Dereck Ligas  M.D.   On: 08/12/2014 20:18   Ct Abdomen Pelvis W Contrast  08/12/2014   CLINICAL DATA:  63 year old male with history of trauma from a fall complaining of left-sided abdominal pain.  EXAM: CT ABDOMEN AND PELVIS WITH CONTRAST  TECHNIQUE: Multidetector CT imaging of the abdomen and pelvis was performed using the standard protocol following bolus administration of intravenous contrast.  CONTRAST:  171mL OMNIPAQUE IOHEXOL 300 MG/ML  SOLN  COMPARISON:  CT of the abdomen 11/19/2009.  FINDINGS: Lower chest: 4 mm nodule in the right middle lobe (image 3 of series 205), unchanged compared to the prior examination; this can be considered benign and does not require imaging followup. Small amount of dependent atelectasis in the lower lobes of the lungs bilaterally. Atherosclerotic calcifications in the left anterior descending, left circumflex and right coronary arteries. Pacemaker leads terminating in the right atrial appendage and right ventricular apex.  Hepatobiliary: No focal cystic or solid hepatic lesions. No intra or extrahepatic biliary ductal dilatation. Gallbladder is normal in appearance. No signs of  acute traumatic injury to the liver.  Pancreas: Unremarkable.  Spleen: No signs of acute traumatic injury to the spleen. Unremarkable in appearance.  Adrenals/Urinary Tract: Bilateral adrenal glands and bilateral kidneys are normal in appearance. Specifically, no evidence of acute traumatic injury to either kidney. No hydroureteronephrosis or perinephric stranding to suggest urinary tract obstruction at this time. Urinary bladder is normal in appearance.  Stomach/Bowel: Normal appearance of the stomach. No pathologic dilatation of small bowel or colon. Normal appendix.  Vascular/Lymphatic: No signs of acute traumatic injury to the major abdominal or pelvic vasculature. Fusiform ectasia of the infrarenal abdominal aorta which measures up to 2.8 x 2.5 cm. No pathologically enlarged lymph nodes are noted in the abdomen or pelvis.  Reproductive: Prostate gland is unremarkable in appearance.  Other: No high attenuation fluid collections within the peritoneal cavity or retroperitoneum to suggest significant posttraumatic hemorrhage. No significant volume of ascites. No pneumoperitoneum.  Musculoskeletal: No acute displaced fractures or aggressive appearing lytic or blastic lesions are noted in the visualized portions of the skeleton.  IMPRESSION: 1. No signs of significant acute traumatic injury to the abdomen or pelvis. 2. No acute findings. 3. Atherosclerosis, including multivessel coronary artery disease as well as fusiform ectasia of the infrarenal abdominal aorta which measures up to 2.8 x 2.5 cm. Assessment for potential risk factor modification, dietary therapy or pharmacologic therapy may be warranted, if clinically indicated. 4. Additional incidental findings, as above.   Electronically Signed   By: Vinnie Langton M.D.   On: 08/12/2014 20:20   Dg Chest Portable 1 View  08/12/2014   CLINICAL DATA:  [place] from ladder.  Landed on left side.  EXAM: PORTABLE CHEST - 1 VIEW  COMPARISON:  05/31/2011  FINDINGS: Right  cardiac pacemaker. Prominent perihilar structures could represent vascular congestion. Specifically, there are increased densities in the right paratracheal region which are new. Surgical plate in the lower cervical spine. Negative for a pneumothorax. Heart size is normal.  IMPRESSION: Prominent perihilar structures, particularly in the right paratracheal region. Findings probably represent central vascular congestion. Consider follow-up to ensure resolution.   Electronically Signed   By: Markus Daft M.D.   On: 08/12/2014 19:11   Dg Shoulder Left  08/12/2014   CLINICAL DATA:  63 year old male with history of trauma from a fall off a ladder onto a wood deck complaining of left shoulder pain.  EXAM: LEFT SHOULDER - 2+ VIEW  COMPARISON:  No priors.  FINDINGS:  There is no evidence of fracture or dislocation. There is no evidence of arthropathy or other focal bone abnormality. Soft tissues are unremarkable. Orthopedic fixation hardware in the lower cervical spine incidentally noted.  IMPRESSION: Negative.   Electronically Signed   By: Vinnie Langton M.D.   On: 08/12/2014 19:30   Dg Hand Complete Left  08/12/2014   CLINICAL DATA:  Fall from ladder. Fall on wooden deck. LEFT hand pain. Initial encounter.  EXAM: LEFT HAND - COMPLETE 3+ VIEW  COMPARISON:  None.  FINDINGS: There is no evidence of fracture or dislocation. There is no evidence of arthropathy or other focal bone abnormality. Soft tissues are unremarkable. Patient was unable to remove ring from the ring finger.  IMPRESSION: Negative.   Electronically Signed   By: Dereck Ligas M.D.   On: 08/12/2014 19:34    EKG: Independently reviewed. Normal sinus rhythm.  Assessment/Plan Principal Problem:   Syncope Active Problems:   Atrial fibrillation   Pacemaker - Medtronic REVO dual-chamber implanted August 2012   Left rib fracture   Intracranial hemorrhage   1. Syncope with history of sinus node dysfunction status post pacemaker placement - I have  discussed the on-call cardiologist Dr. Elias Else who has advised to get 2-D echo and also closely monitored in telemetry for any arrhythmias and reconsult cardiology in a.m. 2. Intracranial bleed - as per neurosurgeon Dr. Kathyrn Sheriff patient is Coumadin has been held and reversed with vitamin K and FFP. Repeat CT head in a.m. Further recommendations per neurosurgeon. Closely follow with neuro checks. 3. Rib fracture - patient has been placed on incentive spirometer and further recommendations past trauma surgeon. 4. History of paroxysmal atrial fibrillation - continue beta blockers and with regarding to come in and see #2. 5. Hypertension - continue home medications. 6. Hyperlipidemia - continue home medications.    Code Status: full code.  Family Communication: patient's wife at the bedside.  Disposition Plan: admit to inpatient.    Kodie Pick N. [facility] Pager [phone].  If 7PM-7AM, please contact night-coverage [email or web address] Password [email or web address] 08/13/2014, 2:28 AM

## 2014-08-13 NOTE — ED Notes (Signed)
Neuro surgery at bedside.

## 2014-08-13 NOTE — ED Notes (Signed)
Patient transported to CT 

## 2014-08-13 NOTE — Progress Notes (Signed)
No issues overnight. Pt complains primarily of chest soreness. Minimal HA. No visual changes.  EXAM:  BP 141/83 mmHg  Pulse 70  Temp(Src) 98.5 F (36.9 C) (Oral)  Resp 18  Ht 6\' 1"  (1.854 m)  Wt 86.183 kg (190 lb)  BMI 25.07 kg/m2  SpO2 96%  Awake, alert, oriented  Speech fluent, appropriate  CN grossly intact  5/5 BUE/BLE   CTH: Stable right occipital contusion with minimal surrounding edema. No mass effect or HCP  IMPRESSION:  63 y.o. male s/p fall with stable right occipital contusion INR nearly normal after Vit K/FFP  PLAN: - Pt stable from neurosurgical standpoint. Can f/u in my office in 4 weeks. Remain off coumadin until f/u

## 2014-08-13 NOTE — Progress Notes (Signed)
Pt blood pressure trending up to 170/90. Pt is resting and complaining of slight dizziness. Paged NP schorr, NP responded to monitor pt no interventions at this time.  Daje Stark M. Dalbert Batman, RN, BSN 08/13/2014 11:54 PM

## 2014-08-13 NOTE — Progress Notes (Signed)
Trauma Service Note  Subjective: Incentive spirometer ordered for now.  Will assess Vt.  Patient is arousable without visual changes.  Complaining mostly of pain in his left chest.  Objective: Vital signs in last 24 hours: Temp:  [97.8 F (36.6 C)-99.5 F (37.5 C)] 98.6 F (37 C) (11/04 0130) Pulse Rate:  [59-81] 75 (11/04 0730) Resp:  [11-22] 21 (11/04 0730) BP: (123-183)/(72-101) 149/83 mmHg (11/04 0730) SpO2:  [92 %-98 %] 97 % (11/04 0730)    Intake/Output from previous day: 11/03 0701 - 11/04 0700 In: 480 [Blood:480] Out: -  Intake/Output this shift:    General: Moderate acute distress.  Only getting 1mg  morphine q4h  Lungs: Clear, but shallow breath sounds.  No crepitance in chest wall.  Abd: LUQ tenderness near costal margin.  Extremities: No changes  Neuro: Intact.  GCS 15.  Lab Results: CBC   Recent Labs  08/12/14 1820 08/12/14 1922 08/13/14 0241  WBC 9.8  --  9.0  HGB 15.9 16.0 14.6  HCT 45.7 47.0 42.2  PLT 136*  --  123*   BMET  Recent Labs  08/12/14 1820 08/12/14 1922 08/13/14 0241  NA 141 142 140  K 3.8 3.6* 3.8  CL 103 100 104  CO2 28  --  25  GLUCOSE 101* 108* 114*  BUN 16 17 16   CREATININE 1.06 1.10 0.96  CALCIUM 10.0  --  9.1   PT/INR  Recent Labs  08/12/14 1820 08/13/14 0241  LABPROT 25.6* 19.0*  INR 2.32* 1.58*   ABG No results for input(s): PHART, HCO3 in the last 72 hours.  Invalid input(s): PCO2, PO2  Studies/Results: Ct Head Wo Contrast  08/13/2014   CLINICAL DATA:  Fall with intra cerebral hemorrhage.  Follow-up.  EXAM: CT HEAD WITHOUT CONTRAST  TECHNIQUE: Contiguous axial images were obtained from the base of the skull through the vertex without intravenous contrast.  COMPARISON:  08/12/2014  FINDINGS: Right occipital parenchymal hemorrhage is again noted, measuring 2.1 cm in greatest diameter, stable since prior study. Small amount of surrounding edema also stable. No midline shift. No new hemorrhage or  hydrocephalus. No acute infarction  Visualized paranasal sinuses and mastoids clear. Orbital soft tissues unremarkable.  IMPRESSION: Stable right occipital intra cerebral hemorrhage.   Electronically Signed   By: Rolm Baptise M.D.   On: 08/13/2014 09:01   Ct Head Wo Contrast  08/12/2014   CLINICAL DATA:  Trauma. Fall. Head injury. Anticoagulated patient. Initial encounter.  EXAM: CT HEAD WITHOUT CONTRAST  CT CERVICAL SPINE WITHOUT CONTRAST  TECHNIQUE: Multidetector CT imaging of the head and cervical spine was performed following the standard protocol without intravenous contrast. Multiplanar CT image reconstructions of the cervical spine were also generated.  COMPARISON:  11/02/2012 head CT.  FINDINGS: CT HEAD FINDINGS  There is a small parenchymal hemorrhage in the RIGHT occipital lobe that measures 2.2 cm x 1.5 cm. Small amount of surrounding edema. In the setting of trauma and anti coagulation, this is most compatible with hemorrhagic parenchymal contusion. Scattered lacunar infarcts are present along with mild atrophy and chronic ischemic white matter disease. Posterior fossa structures appear within normal limits. There is no hydrocephalus or intraventricular blood.  Mastoid air cells are clear. No midline shift. No mass lesion. The calvarium is intact.  CT CERVICAL SPINE FINDINGS  Cervical spinal alignment is within normal limits. Prevertebral soft tissues are normal. C5-C6 ACDF with solid fusion. Odontoid intact. Occipital condyles appear intact. Vertebral artery atherosclerosis incidentally noted. Lung apices show dependent atelectasis.  Dual lead RIGHT subclavian cardiac pacemaker leads partially visualized.  IMPRESSION: 1. Small RIGHT occipital lobe parenchymal hemorrhage, most compatible with hemorrhagic contusion. 2. No acute abnormality in the cervical spine. Solid C5-C6 fusion with ACDF plate.   Electronically Signed   By: Dereck Ligas M.D.   On: 08/12/2014 20:18   Ct Chest Wo  Contrast  08/12/2014   CLINICAL DATA:  Fall and complains of left rib pain.  EXAM: CT CHEST WITHOUT CONTRAST  TECHNIQUE: Multidetector CT imaging of the chest was performed following the standard protocol without IV contrast.  COMPARISON:  CT 10/05/2011  FINDINGS: Ascending thoracic aorta is enlarged measuring 4.2 cm and similar to the previous examination. Coronary arteries are heavily calcified. Patient has a cardiac pacemaker. No evidence for chest lymphadenopathy. No significant pericardial or pleural fluid. There is contrast in the renal collecting systems consistent with a recent abdominal CT. No acute abnormality in the upper abdomen.  The trachea and mainstem bronchi are patent. There are patchy densities in the lower lobes bilaterally. Findings could represent atelectasis or dependent edema. Stable 4 mm nodule in the right middle lobe on sequence 3, image 31. This is likely benign based on the stability. There is also a stable 4 mm nodule in the right upper lobe. Punctate nodule in the left upper lobe on sequence 3, image 18 appears stable.  Shoulders are located bilaterally. There is a nondisplaced fracture of the left posterior ninth rib. Nondisplaced fracture of the left posterior tenth rib. Minimally displaced fracture of the left eleventh rib. There is also a fracture involving the left twelfth rib.  IMPRESSION: Fractures of the left ninth, tenth, eleventh and twelfth ribs. Negative for a pneumothorax.  Densities throughout the lower lobes bilaterally. Findings could represent a combination of atelectasis and/or dependent edema.  Stable small pulmonary nodules as described.  Stable enlargement of the ascending thoracic aorta measuring up to 4.2 cm. Recommend surveillance of the ascending thoracic aorta.  Coronary artery calcifications.   Electronically Signed   By: Markus Daft M.D.   On: 08/12/2014 22:42   Ct Cervical Spine Wo Contrast  08/12/2014   CLINICAL DATA:  Trauma. Fall. Head injury.  Anticoagulated patient. Initial encounter.  EXAM: CT HEAD WITHOUT CONTRAST  CT CERVICAL SPINE WITHOUT CONTRAST  TECHNIQUE: Multidetector CT imaging of the head and cervical spine was performed following the standard protocol without intravenous contrast. Multiplanar CT image reconstructions of the cervical spine were also generated.  COMPARISON:  11/02/2012 head CT.  FINDINGS: CT HEAD FINDINGS  There is a small parenchymal hemorrhage in the RIGHT occipital lobe that measures 2.2 cm x 1.5 cm. Small amount of surrounding edema. In the setting of trauma and anti coagulation, this is most compatible with hemorrhagic parenchymal contusion. Scattered lacunar infarcts are present along with mild atrophy and chronic ischemic white matter disease. Posterior fossa structures appear within normal limits. There is no hydrocephalus or intraventricular blood.  Mastoid air cells are clear. No midline shift. No mass lesion. The calvarium is intact.  CT CERVICAL SPINE FINDINGS  Cervical spinal alignment is within normal limits. Prevertebral soft tissues are normal. C5-C6 ACDF with solid fusion. Odontoid intact. Occipital condyles appear intact. Vertebral artery atherosclerosis incidentally noted. Lung apices show dependent atelectasis. Dual lead RIGHT subclavian cardiac pacemaker leads partially visualized.  IMPRESSION: 1. Small RIGHT occipital lobe parenchymal hemorrhage, most compatible with hemorrhagic contusion. 2. No acute abnormality in the cervical spine. Solid C5-C6 fusion with ACDF plate.   Electronically Signed   By:  Dereck Ligas M.D.   On: 08/12/2014 20:18   Ct Abdomen Pelvis W Contrast  08/12/2014   CLINICAL DATA:  63 year old male with history of trauma from a fall complaining of left-sided abdominal pain.  EXAM: CT ABDOMEN AND PELVIS WITH CONTRAST  TECHNIQUE: Multidetector CT imaging of the abdomen and pelvis was performed using the standard protocol following bolus administration of intravenous contrast.   CONTRAST:  141mL OMNIPAQUE IOHEXOL 300 MG/ML  SOLN  COMPARISON:  CT of the abdomen 11/19/2009.  FINDINGS: Lower chest: 4 mm nodule in the right middle lobe (image 3 of series 205), unchanged compared to the prior examination; this can be considered benign and does not require imaging followup. Small amount of dependent atelectasis in the lower lobes of the lungs bilaterally. Atherosclerotic calcifications in the left anterior descending, left circumflex and right coronary arteries. Pacemaker leads terminating in the right atrial appendage and right ventricular apex.  Hepatobiliary: No focal cystic or solid hepatic lesions. No intra or extrahepatic biliary ductal dilatation. Gallbladder is normal in appearance. No signs of acute traumatic injury to the liver.  Pancreas: Unremarkable.  Spleen: No signs of acute traumatic injury to the spleen. Unremarkable in appearance.  Adrenals/Urinary Tract: Bilateral adrenal glands and bilateral kidneys are normal in appearance. Specifically, no evidence of acute traumatic injury to either kidney. No hydroureteronephrosis or perinephric stranding to suggest urinary tract obstruction at this time. Urinary bladder is normal in appearance.  Stomach/Bowel: Normal appearance of the stomach. No pathologic dilatation of small bowel or colon. Normal appendix.  Vascular/Lymphatic: No signs of acute traumatic injury to the major abdominal or pelvic vasculature. Fusiform ectasia of the infrarenal abdominal aorta which measures up to 2.8 x 2.5 cm. No pathologically enlarged lymph nodes are noted in the abdomen or pelvis.  Reproductive: Prostate gland is unremarkable in appearance.  Other: No high attenuation fluid collections within the peritoneal cavity or retroperitoneum to suggest significant posttraumatic hemorrhage. No significant volume of ascites. No pneumoperitoneum.  Musculoskeletal: No acute displaced fractures or aggressive appearing lytic or blastic lesions are noted in the  visualized portions of the skeleton.  IMPRESSION: 1. No signs of significant acute traumatic injury to the abdomen or pelvis. 2. No acute findings. 3. Atherosclerosis, including multivessel coronary artery disease as well as fusiform ectasia of the infrarenal abdominal aorta which measures up to 2.8 x 2.5 cm. Assessment for potential risk factor modification, dietary therapy or pharmacologic therapy may be warranted, if clinically indicated. 4. Additional incidental findings, as above.   Electronically Signed   By: Vinnie Langton M.D.   On: 08/12/2014 20:20   Dg Chest Portable 1 View  08/12/2014   CLINICAL DATA:  [place] from ladder.  Landed on left side.  EXAM: PORTABLE CHEST - 1 VIEW  COMPARISON:  05/31/2011  FINDINGS: Right cardiac pacemaker. Prominent perihilar structures could represent vascular congestion. Specifically, there are increased densities in the right paratracheal region which are new. Surgical plate in the lower cervical spine. Negative for a pneumothorax. Heart size is normal.  IMPRESSION: Prominent perihilar structures, particularly in the right paratracheal region. Findings probably represent central vascular congestion. Consider follow-up to ensure resolution.   Electronically Signed   By: Markus Daft M.D.   On: 08/12/2014 19:11   Dg Shoulder Left  08/12/2014   CLINICAL DATA:  63 year old male with history of trauma from a fall off a ladder onto a wood deck complaining of left shoulder pain.  EXAM: LEFT SHOULDER - 2+ VIEW  COMPARISON:  No  priors.  FINDINGS: There is no evidence of fracture or dislocation. There is no evidence of arthropathy or other focal bone abnormality. Soft tissues are unremarkable. Orthopedic fixation hardware in the lower cervical spine incidentally noted.  IMPRESSION: Negative.   Electronically Signed   By: Vinnie Langton M.D.   On: 08/12/2014 19:30   Dg Hand Complete Left  08/12/2014   CLINICAL DATA:  Fall from ladder. Fall on wooden deck. LEFT hand pain.  Initial encounter.  EXAM: LEFT HAND - COMPLETE 3+ VIEW  COMPARISON:  None.  FINDINGS: There is no evidence of fracture or dislocation. There is no evidence of arthropathy or other focal bone abnormality. Soft tissues are unremarkable. Patient was unable to remove ring from the ring finger.  IMPRESSION: Negative.   Electronically Signed   By: Dereck Ligas M.D.   On: 08/12/2014 19:34    Anti-infectives: Anti-infectives    None      Assessment/Plan: s/p  Advance diet Incentive spirometer  Repeat INR, if still up will give Vit K or FFP   LOS: 1 day   Kathryne Eriksson. Dahlia Bailiff, MD, FACS [phone] Trauma Surgeon 08/13/2014

## 2014-08-13 NOTE — Consult Note (Signed)
CC:  Chief Complaint  Patient presents with  . Fall    HPI: Alex Maddox is a 63 y.o. male seen in the ED after falling from a ladder while attempting to clean his gutters. He is amnestic to the events of the fall. He is complaining primarily of left-sided chest pain. He does also c/o intermittent HA. He does not have any subjective visual changes, nor does he have any numbness/tingling/weakness. Of note, he is on coumadin with a prior history of stroke.  PMH: Past Medical History  Diagnosis Date  . Hypertension   . Stroke   . Paroxysmal atrial fibrillation   . Dyslipidemia   . Tachycardia-bradycardia   . Sinus node dysfunction   . Syncope     2D Echocardiogram 09/27/2010 EF between 40 to 45%  . Transient ischemic attack (TIA) 06/07  . Sleep apnea, obstructive     sleep study 10/09/2007  AHI 5.73/hr  REM AHI 11.90/hr   . Abnormal echocardiogram     stress myoview 10/29/2010    PSH: Past Surgical History  Procedure Laterality Date  . Pacemaker insertion  05/30/2011    Medtronic Revo   last checked 11/07/2012  . Loop recorder implant  02/02/2011    Medtronic- CRM     SH: History  Substance Use Topics  . Smoking status: Never Smoker   . Smokeless tobacco: Not on file  . Alcohol Use: No    MEDS: Prior to Admission medications   Medication Sig Start Date End Date Taking? Authorizing Provider  carvedilol (COREG) 25 MG tablet Take 1 tablet (25 mg total) by mouth 2 (two) times daily. 04/08/14  Yes Brittainy Erie Noe, PA-C  fish oil-omega-3 fatty acids 1000 MG capsule Take 1 g by mouth daily.   Yes Historical Provider, MD  Multiple Vitamin (MULTIVITAMIN) tablet Take 1 tablet by mouth daily.   Yes Historical Provider, MD  saw palmetto 160 MG capsule Take 160 mg by mouth daily.   Yes Historical Provider, MD  sildenafil (VIAGRA) 100 MG tablet Take 1 tablet (100 mg total) by mouth daily as needed for erectile dysfunction. 08/23/13  Yes Mihai Croitoru, MD  simvastatin (ZOCOR) 40  MG tablet TAKE ONE (1) TABLET AT BEDTIME 02/25/14  Yes Mihai Croitoru, MD  valsartan (DIOVAN) 320 MG tablet Take 320 mg by mouth daily.   Yes Historical Provider, MD  warfarin (COUMADIN) 5 MG tablet Take 2.5-5 mg by mouth daily at 6 PM. Takes 2.5mg  on Fri only Takes 5mg  all other days   Yes Historical Provider, MD  FLUVIRIN SUSP Inject 1 application into the muscle once. 07/24/14   Historical Provider, MD  warfarin (COUMADIN) 5 MG tablet TAKE ONE (1) TABLET BY MOUTH EVERY DAY OR AS DIRECTED 04/22/14   Mihai Croitoru, MD    ALLERGY: No Known Allergies  ROS: Review of Systems  Constitutional: Negative for fever and chills.  HENT: Negative for hearing loss.   Eyes: Negative for blurred vision and double vision.  Respiratory: Negative for cough and shortness of breath.   Cardiovascular: Positive for chest pain.  Gastrointestinal: Negative for heartburn, nausea and vomiting.  Genitourinary: Negative for dysuria.  Musculoskeletal: Positive for myalgias and joint pain. Negative for back pain and neck pain.  Skin: Negative for rash.  Neurological: Positive for loss of consciousness and headaches. Negative for dizziness, tingling, sensory change and focal weakness.  Endo/Heme/Allergies: Bruises/bleeds easily.  Psychiatric/Behavioral: Negative for depression.    NEUROLOGIC EXAM: Awake, alert, oriented Memory and concentration grossly intact  Speech fluent, appropriate CN grossly intact x left inferior quadrantic defect on confrontation testing Motor exam: Upper Extremities Deltoid Bicep Tricep Grip  Right 5/5 5/5 5/5 5/5  Left 5/5 5/5 5/5 5/5   Lower Extremity IP Quad PF DF EHL  Right 5/5 5/5 5/5 5/5 5/5  Left 5/5 5/5 5/5 5/5 5/5   Sensation grossly intact to LT  IMGAING: CTH demonstrates small right occipital hemorrhagic contusion without associated mass effect. No HCP.  IMPRESSION: - 63 y.o. male s/p fall with right occipital contusion and nearly intact exam with possible  quadrantic visual field cut   PLAN: - Repeat CTH this am - Coumadin has been reversed with Vit K and FFP transfusion. Re-check PT/INR - Additional mgmt per trauma.

## 2014-08-14 ENCOUNTER — Encounter (HOSPITAL_COMMUNITY): Payer: Self-pay | Admitting: Physician Assistant

## 2014-08-14 DIAGNOSIS — I619 Nontraumatic intracerebral hemorrhage, unspecified: Secondary | ICD-10-CM | POA: Insufficient documentation

## 2014-08-14 DIAGNOSIS — I519 Heart disease, unspecified: Secondary | ICD-10-CM

## 2014-08-14 DIAGNOSIS — I480 Paroxysmal atrial fibrillation: Secondary | ICD-10-CM

## 2014-08-14 DIAGNOSIS — I10 Essential (primary) hypertension: Secondary | ICD-10-CM

## 2014-08-14 DIAGNOSIS — S06369A Traumatic hemorrhage of cerebrum, unspecified, with loss of consciousness of unspecified duration, initial encounter: Secondary | ICD-10-CM

## 2014-08-14 DIAGNOSIS — E785 Hyperlipidemia, unspecified: Secondary | ICD-10-CM | POA: Insufficient documentation

## 2014-08-14 DIAGNOSIS — W19XXXA Unspecified fall, initial encounter: Secondary | ICD-10-CM

## 2014-08-14 DIAGNOSIS — R55 Syncope and collapse: Secondary | ICD-10-CM

## 2014-08-14 DIAGNOSIS — I482 Chronic atrial fibrillation: Secondary | ICD-10-CM

## 2014-08-14 LAB — COMPREHENSIVE METABOLIC PANEL
ALBUMIN: 3.4 g/dL — AB (ref 3.5–5.2)
ALK PHOS: 47 U/L (ref 39–117)
ALT: 24 U/L (ref 0–53)
AST: 22 U/L (ref 0–37)
Anion gap: 15 (ref 5–15)
BILIRUBIN TOTAL: 1.2 mg/dL (ref 0.3–1.2)
BUN: 17 mg/dL (ref 6–23)
CO2: 23 mEq/L (ref 19–32)
CREATININE: 0.99 mg/dL (ref 0.50–1.35)
Calcium: 9.4 mg/dL (ref 8.4–10.5)
Chloride: 102 mEq/L (ref 96–112)
GFR calc non Af Amer: 86 mL/min — ABNORMAL LOW (ref >90)
Glucose, Bld: 120 mg/dL — ABNORMAL HIGH (ref 70–99)
POTASSIUM: 3.6 meq/L — AB (ref 3.7–5.3)
Sodium: 140 mEq/L (ref 137–147)
TOTAL PROTEIN: 6.5 g/dL (ref 6.0–8.3)

## 2014-08-14 LAB — CBC
HCT: 43 % (ref 39.0–52.0)
HEMOGLOBIN: 14.5 g/dL (ref 13.0–17.0)
MCH: 31.4 pg (ref 26.0–34.0)
MCHC: 33.7 g/dL (ref 30.0–36.0)
MCV: 93.1 fL (ref 78.0–100.0)
Platelets: 117 10*3/uL — ABNORMAL LOW (ref 150–400)
RBC: 4.62 MIL/uL (ref 4.22–5.81)
RDW: 12.7 % (ref 11.5–15.5)
WBC: 6.2 10*3/uL (ref 4.0–10.5)

## 2014-08-14 LAB — PROTIME-INR
INR: 1.31 (ref 0.00–1.49)
Prothrombin Time: 16.4 seconds — ABNORMAL HIGH (ref 11.6–15.2)

## 2014-08-14 LAB — MAGNESIUM: MAGNESIUM: 2.1 mg/dL (ref 1.5–2.5)

## 2014-08-14 LAB — PHOSPHORUS: Phosphorus: 2.1 mg/dL — ABNORMAL LOW (ref 2.3–4.6)

## 2014-08-14 MED ORDER — HYDROMORPHONE HCL 1 MG/ML IJ SOLN
0.5000 mg | INTRAMUSCULAR | Status: DC | PRN
Start: 2014-08-14 — End: 2014-08-16
  Administered 2014-08-14: 1 mg via INTRAVENOUS
  Filled 2014-08-14: qty 1

## 2014-08-14 MED ORDER — ONDANSETRON HCL 4 MG/2ML IJ SOLN
4.0000 mg | Freq: Four times a day (QID) | INTRAMUSCULAR | Status: DC | PRN
Start: 2014-08-14 — End: 2014-08-16
  Administered 2014-08-14: 4 mg via INTRAVENOUS
  Filled 2014-08-14: qty 2

## 2014-08-14 MED ORDER — KETOROLAC TROMETHAMINE 15 MG/ML IJ SOLN
15.0000 mg | Freq: Three times a day (TID) | INTRAMUSCULAR | Status: DC
  Administered 2014-08-14 – 2014-08-15 (×3): 15 mg via INTRAVENOUS
  Filled 2014-08-14 (×5): qty 1

## 2014-08-14 MED ORDER — PANTOPRAZOLE SODIUM 40 MG PO TBEC
40.0000 mg | DELAYED_RELEASE_TABLET | Freq: Every day | ORAL | Status: AC
  Administered 2014-08-14 – 2014-08-16 (×3): 40 mg via ORAL
  Filled 2014-08-14 (×3): qty 1

## 2014-08-14 MED ORDER — ONDANSETRON HCL 4 MG PO TABS: 4.0000 mg | ORAL_TABLET | ORAL | Status: DC | PRN

## 2014-08-14 NOTE — Progress Notes (Signed)
PT Cancellation Note  Patient Details Name: Alex Maddox MRN: [id number] DOB: [birth year]   Cancelled Treatment:    Reason Eval/Treat Not Completed: Patient at procedure or test/unavailable (Pt currently getting Echo and unavailable)   Lanetta Inch Beth 08/14/2014, 13:51PM Elwyn Reach, [place]

## 2014-08-14 NOTE — Progress Notes (Signed)
Received pt at 19:05pm via stretcher alert, stable with no noted distress. Wife by his side. PERRLA. Neuro intact.  HOB elevated. Pt denies pain or discomfort at this time.  Monica, RN gave report stated that pt c/o nausea and Zofran was administered. IV saline locked bilaterally AC. Safety measures in place. Pt educated and oriented to room. Call bell within reach. Will continue to monitor.

## 2014-08-14 NOTE — Evaluation (Signed)
Occupational Therapy Evaluation Patient Details Name: Alex Maddox MRN: [id number] DOB: [birth year] Today's Date: 08/14/2014    History of Present Illness This 63 y.o. male admitted after losing consciousness and falling off ladder while working on his gutters.  Head CT showed small Rt occiptal hemorrhage.  Pt also with rib fractures.  PMH: includes h/o recurrent neurocardiogenic syncope and sinus node dysfunction; s/p pacemaker placement; A-Fib (on Coumadin); HTN; Hyperlipidemia; hypertrophic cardiomyopathy.    Clinical Impression   Pt admitted with above. He demonstrates the below listed deficits and will benefit from continued OT to maximize safety and independence with BADLs.  Pt only able to move to EOB sitting due to onset of severe dizziness.  Pt unable to tolerate sitting up and returned to supine.  Recommend vestibular assessment.   He does demonstrate Lt. Inferior field deficit.  Recommend visual field testing by ophthalmology and follow up OP OT at discharge.    Will continue to assess vision and higher level cognition.        Follow Up Recommendations  Outpatient OT;Supervision/Assistance - 24 hour    Equipment Recommendations  None recommended by OT    Recommendations for Other Services       Precautions / Restrictions Precautions Precautions: Fall Precaution Comments: Pt with complaint of spinning/dizziness with supine to sit      Mobility Bed Mobility Overal bed mobility: Needs Assistance Bed Mobility: Supine to Sit;Sit to Supine     Supine to sit: HOB elevated;Min guard Sit to supine: Min assist;HOB elevated   General bed mobility comments: Pt moves slowly and guarded due to dizziness and fear of pain.  Needed assist to return to supine due to dizziness   Transfers                      Balance Overall balance assessment: Needs assistance Sitting-balance support: Feet supported Sitting balance-Leahy Scale: Good                                       ADL Overall ADL's : Needs assistance/impaired Eating/Feeding: Independent;Bed level   Grooming: Wash/dry hands;Wash/dry face;Oral care;Brushing hair;Set up;Bed level   Upper Body Bathing: Set up;Bed level   Lower Body Bathing: Moderate assistance;Bed level   Upper Body Dressing : Moderate assistance;Bed level   Lower Body Dressing: Maximal assistance;Bed level   Toilet Transfer: Total assistance Toilet Transfer Details (indicate cue type and reason): Unable to attempt due to dizziness Toileting- Clothing Manipulation and Hygiene: Maximal assistance;Bed level       Functional mobility during ADLs:  (unable to assess due to dizziness ) General ADL Comments: Pt moved to EOB with onset severe dizziness and nausea.  Pt instructed to stabilize gaze, but had to return to supine      Vision Eye Alignment: Within Functional Limits   Ocular Range of Motion: Within Functional Limits Tracking/Visual Pursuits: Able to track stimulus in all quads without difficulty         Additional Comments: basic visual assessment performed with pt supine - limited assessment due to pt dizziness.  Pt with no obvious signs of nystagmus with pursuits.  He denies increase in dizziness with visual testing.  Pt with Lt inferior field deficit.   Did not test each eye individually so cannot confirm it if is a homonomus quadrantnopsia.   Discussed impact on function with pt and wife.  Also discussed  need for further testing by OD or ophthalmologist.  Wife will call their current eye doctor to find out if he can test peripheral vision    Perception     Praxis      Pertinent Vitals/Pain Pain Assessment: Faces Faces Pain Scale: Hurts a little bit Pain Location: ribs Pain Descriptors / Indicators: Aching Pain Intervention(s): Monitored during session;Premedicated before session;Repositioned     Hand Dominance Right   Extremity/Trunk Assessment Upper Extremity Assessment Upper Extremity  Assessment: Overall WFL for tasks assessed (strength not tested due to rib fractures/pain)   Lower Extremity Assessment Lower Extremity Assessment: Defer to PT evaluation   Cervical / Trunk Assessment Cervical / Trunk Assessment: Normal   Communication Communication Communication: No difficulties   Cognition Arousal/Alertness: Awake/alert Behavior During Therapy: WFL for tasks assessed/performed Overall Cognitive Status: Within Functional Limits for tasks assessed [facility] for basic tasks. Higher level skills not assessed)                     General Comments       Exercises       Shoulder Instructions      Home Living Family/patient expects to be discharged to:: Private residence Living Arrangements: Spouse/significant other Available Help at Discharge: Family;Friend(s);Available PRN/intermittently Type of Home: House Home Access: Stairs to enter Entrance Stairs-Number of Steps: 2 small steps Entrance Stairs-Rails: None Home Layout: One level     Bathroom Shower/Tub: Tub/shower unit;Curtain Shower/tub characteristics: Architectural technologist: Standard     Home Equipment: None          Prior Functioning/Environment Level of Independence: Independent        Comments: Pt  works as a Curator for cigarette packaging.  Requires frequent heavy lifting     OT Diagnosis: Generalized weakness;Disturbance of vision;Acute pain   OT Problem List: Decreased strength;Decreased activity tolerance;Impaired balance (sitting and/or standing);Impaired vision/perception;Decreased knowledge of use of DME or AE;Pain   OT Treatment/Interventions: Self-care/ADL training;DME and/or AE instruction;Therapeutic activities;Visual/perceptual remediation/compensation;Patient/family education;Balance training;Cognitive remediation/compensation    OT Goals(Current goals can be found in the care plan section) Acute Rehab OT Goals Patient Stated Goal: to get  better OT Goal Formulation: With patient Time For Goal Achievement: 08/28/14 Potential to Achieve Goals: Good ADL Goals Pt Will Perform Grooming: with supervision;standing Pt Will Perform Upper Body Bathing: with set-up;sitting Pt Will Perform Lower Body Bathing: with supervision;sit to/from stand Pt Will Perform Upper Body Dressing: with supervision;sitting Pt Will Perform Lower Body Dressing: with supervision;sit to/from stand Pt Will Transfer to Toilet: with supervision;ambulating;regular height toilet;grab bars Pt Will Perform Toileting - Clothing Manipulation and hygiene: with supervision;sit to/from stand Additional ADL Goal #1: Pt will demonstrate good awareness of visual field loss and how it does and may impact him functionally  Additional ADL Goal #2: Pt will participate in further visual and cognitive assessment   OT Frequency: Min 2X/week   Barriers to D/C:            Co-evaluation              End of Session Nurse Communication: Mobility status  Activity Tolerance: Other (comment) (dizziness) Patient left: in bed;with call bell/phone within reach;with family/visitor present   Time: 6440-3474 OT Time Calculation (min): 40 min Charges:  OT General Charges $OT Visit: 1 Procedure OT Evaluation $Initial OT Evaluation Tier I: 1 Procedure OT Treatments $Self Care/Home Management : 8-22 mins $Therapeutic Activity: 8-22 mins G-Codes:    Armani Gawlik M Aug 19, 2014, 3:46 PM

## 2014-08-14 NOTE — Progress Notes (Signed)
[place] TEAM 1 - Stepdown/ICU TEAM Progress Note  JHALEN ELEY DUK:[id number] DOB: 1951/03/05 DOA: 08/12/2014 PCP: Leonides Grills, MD  Admit HPI / Brief Narrative: 63 y.o. WM PMHx recurrent neurocardiogenic syncope and sinus node dysfunction status post pacemaker placement, paroxysmal atrial fibrillation on Coumadin, hypertension, hyperlipidemia and hypertrophic cardiomyopathy was brought to the ER after patient fell while trying to work on his house gutter. Patient was on his ladder when suddenly he lost consciousness and fell onto the deck. By the time patient's wife saw the patient he had regained consciousness.   In the ER CT head showed small occipital hemorrhage and rib fractures. On-call Neurosurgeon and Trauma Surgeons were consulted. Patient after falling did have some headache mostly in the frontal area but denied nausea vomiting or any weakness of the upper or lower extremities. Patient stated he did not have any prodrome before he lost his consciousness. As per the ER physician patient's pacemaker was interrogated and was unremarkable. Patient was on Coumadin and was therefore given FFP and Kcentra.  HPI/Subjective: C/o window blinds "moving funny" when he looked at them after he first awakened but that symptom is not present now. Still endorsing significant left side chest wall pain over rib fractures.  Assessment/Plan:  Syncope with history of sinus node dysfunction status post pacemaker placement Monitor on tele - this is a chronic issue - followed by Dr. Loletha Grayer w/ [facility] for this issue chronically - will ask them to f/u while the pt is hospitalized-[employer] notified 11/5 at 1235 pm  Intracranial bleed F/u CT head notes stability   Rib fracture patient has been placed on incentive spirometer - Trauma Surgery following -pain not well controlled; cont PO Oxy and IV Dilaudid for BT pain- add IV Toradol on schedule q 8 hrs for 2 days only- add PPI-begin PT/OT  History of  paroxysmal atrial fibrillation continue beta blockers? Will await cardiology recommendation - NSR at present   Hypertension continue home medications.  Hyperlipidemia continue home medications.  Code Status: FULL Family Communication: no family present at time of exam Disposition Plan: Transfer to  5W telemetry- needs to demonstrate adequate pain control and mobility before can dc- Trauma also utilizing TBI protocol  Consultants: NS/Dr. Lindie Spruce Trauma/Dr. Hulen Skains Cardiology/  Procedures: 2D ECHO pending  Antibiotics: none  DVT prophylaxis: SCDs  Objective: Blood pressure 154/95, pulse 65, temperature 98 F (36.7 C), temperature source Oral, resp. rate 16, height 6\' 1"  (1.854 m), weight 190 lb (86.183 kg), SpO2 95 %.  Intake/Output Summary (Last 24 hours) at 08/14/14 1230 Last data filed at 08/14/14 1027  Gross per 24 hour  Intake    490 ml  Output   1450 ml  Net   -960 ml   Exam: Gen: No acute respiratory distress Chest: Clear to auscultation bilaterally (somewhat shallow effort) without wheezes, rhonchi or crackles, room air-very tender left chest wall- IS up to 750 Cardiac: Regular rate and sinus rhythm, S1-S2, no rubs murmurs or gallops, no peripheral edema, no JVD Abdomen: Soft nontender nondistended without obvious hepatosplenomegaly, no ascites Extremities: Symmetrical in appearance without cyanosis, clubbing or effusion   Data Reviewed: Basic Metabolic Panel:  Recent Labs Lab 08/12/14 1820 08/12/14 1922 08/13/14 0241 08/14/14 0253  NA 141 142 140 140  K 3.8 3.6* 3.8 3.6*  CL 103 100 104 102  CO2 28  --  25 23  GLUCOSE 101* 108* 114* 120*  BUN 16 17 16 17   CREATININE 1.06 1.10 0.96 0.99  CALCIUM 10.0  --  9.1 9.4  MG  --   --   --  2.1  PHOS  --   --   --  2.1*    Liver Function Tests:  Recent Labs Lab 08/12/14 1820 08/13/14 0241 08/14/14 0253  AST 33 28 22  ALT 34 28 24  ALKPHOS 57 53 47  BILITOT 0.6 0.9 1.2  PROT 6.9 6.6 6.5    ALBUMIN 3.9 3.5 3.4*    Coags:  Recent Labs Lab 08/12/14 1820 08/13/14 0241 08/13/14 1020 08/14/14 0253  INR 2.32* 1.58* 1.46 1.31    CBC:  Recent Labs Lab 08/12/14 1820 08/12/14 1922 08/13/14 0241 08/14/14 0230  WBC 9.8  --  9.0 6.2  NEUTROABS 8.1*  --  7.0  --   HGB 15.9 16.0 14.6 14.5  HCT 45.7 47.0 42.2 43.0  MCV 91.0  --  90.6 93.1  PLT 136*  --  123* 117*    Cardiac Enzymes:  Recent Labs Lab 08/13/14 0241 08/13/14 1020  TROPONINI <0.30 <0.30   Studies:  Recent x-ray studies have been reviewed in detail by the Attending Physician  Scheduled Meds:  Scheduled Meds: . carvedilol  25 mg Oral BID  . irbesartan  300 mg Oral Daily  . ketorolac  15 mg Intravenous 3 times per day  . omega-3 acid ethyl esters  1 g Oral Daily  . pantoprazole  40 mg Oral Daily  . simvastatin  40 mg Oral q1800    Time spent on care of this patient: 40 mins   ELLIS,ALLISON L. , ANP  [facility] Office  [phone] Pager - Text Page per Shea Evans as per below:  On-Call/Text Page:      [email or web address]      password [email or web address]  If 7PM-7AM, please contact night-coverage [email or web address] Password [email or web address] 08/14/2014, 12:30 PM   LOS: 2 days   Examined patient and discussed assessment and plan with ANP Ebony Hail and agree with above. Patient with multiple complex medical problems> 40 minutes spent in direct patient care

## 2014-08-14 NOTE — Progress Notes (Signed)
Echocardiogram 2D Echocardiogram has been performed.  Alex Maddox 08/14/2014, 3:15 PM

## 2014-08-14 NOTE — Consult Note (Signed)
CARDIOLOGY CONSULT NOTE   Patient ID: Alex Maddox MRN: [id number], DOB/AGE: Mar 14, 1951   Admit date: 08/12/2014 Date of Consult: 08/14/2014   Primary Physician: Leonides Grills, MD Primary Cardiologist: Dr. Sallyanne Kuster  Pt. Profile  63 yo male with PMH of HTN, HLD, CVA, PAF on chronic coumadin and sinus nodal dysfunction s/p Medtronic PPM present with possible syncope and fall. Suffered R occipital bleed and L rib fx from 9-12  Problem List  Past Medical History  Diagnosis Date  . Hypertension   . Stroke   . Paroxysmal atrial fibrillation   . Dyslipidemia   . Tachycardia-bradycardia   . Sinus node dysfunction   . Syncope     2D Echocardiogram 09/27/2010 EF between 40 to 45%  . Transient ischemic attack (TIA) 06/07  . Sleep apnea, obstructive     sleep study 10/09/2007  AHI 5.73/hr  REM AHI 11.90/hr   . Abnormal echocardiogram     stress myoview 10/29/2010  . Arthritis     " IN MY BACK "  . 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    LEFT THUMB   . Presence of permanent cardiac pacemaker     Past Surgical History  Procedure Laterality Date  . Pacemaker insertion  05/30/2011    Medtronic Revo   last checked 11/07/2012  . Loop recorder implant  02/02/2011    Medtronic- CRM   . Insert / replace / remove pacemaker  05/2011     Allergies  No Known Allergies  HPI   Alex Maddox is a 63 yo male with PMH of HTN, HLD, CVA, PAF on chronic coumadin and sinus nodal dysfunction s/p Medtronic PPM. He denies any prior history of coronary artery disease. Patient has a history of difficult to control blood pressure. 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 He denies any prior history of exertional angina or shortness of breath. He states he has a long-standing history of multiple neurocardiac syncope. The last episode was January of this year when he passed out 5 times in the doctor's office. Per his wife, he was having flulike symptom at the  time. He denies any recent lower extremity edema, orthopnea or paroxysmal nocturnal dyspnea, however endorse significant nighttime sweating. He states his blood pressure usually runs high in the morning and is better at night. And that he always have morning dizziness as well. The last time patient was seen in our office was on 04/08/2014, at which time was noted his PCP has increased his valsartan from 160 mg daily to 230 mg daily. During the office visit, his blood pressure was still very high at 172/102. His coreg was also increased to 25 mg twice a day to further help controlling his blood pressure. Since that time, patient continued to deal with uncontrolled blood pressure especially in the morning.  Patient was trying to get a leaf blower up to the roof of his house using a ladder in the afternoon of 08/12/2014 when he supposedly lost consciousness and fell. However according to the patient, he does not remember the whole event, therefore he is unclear if he tripped and fell or if he passed out and fell. On arrival to [facility], his blood pressure was 140s over 80s. O2 saturation 93% on room air. Heart rate in the 70s. Initial laboratory finding include normal CBC and BMP, INR was therapeutic at 2.32. Chest x-ray showed possible central vascular congestion. EKG showed normal sinus  rhythm heart rate of 70s, right bundle branch block, nonspecific T wave inversion. CT of the brain showed small right occipital lobe parenchymal hemorrhage. CT of the chest showed fracture of the left ninth, 10th, 11th and 12th rib. Stable enlargement of the ascending thoracic aorta measuring 4.2 cm. CT of the abdomen showed no acute etiology, however did reveal atherosclerosis in multiple coronary arteries. Trauma and neurosurgery was consulted. Per neurosurgery recommendation, patient was given FFP and vitamin K for reversal of Coumadin. Repeat CT of the brain was obtained on 11/4 which showed stable right occipital  cerebral hemorrhage.   Inpatient Medications  . carvedilol  25 mg Oral BID  . irbesartan  300 mg Oral Daily  . ketorolac  15 mg Intravenous 3 times per day  . omega-3 acid ethyl esters  1 g Oral Daily  . pantoprazole  40 mg Oral Daily  . simvastatin  40 mg Oral q1800    Family History Family History  Problem Relation Age of Onset  . Heart attack Father 57    deceased from heart attack     Social History History   Social History  . Marital Status: Married    Spouse Name: N/A    Number of Children: N/A  . Years of Education: N/A   Occupational History  . Not on file.   Social History Main Topics  . Smoking status: Never Smoker   . Smokeless tobacco: Never Used  . Alcohol Use: No  . Drug Use: No  . Sexual Activity: Not on file   Other Topics Concern  . 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     Review of Systems  General:  No chills, fever, night sweats or weight changes.  Cardiovascular:  No dyspnea on exertion, edema, orthopnea, palpitations, paroxysmal nocturnal dyspnea.  +chest pain Dermatological: No rash, lesions/masses Respiratory: No cough, dyspnea Urologic: No hematuria, dysuria Abdominal:   No nausea, vomiting, diarrhea, bright red blood per rectum, melena, or hematemesis Neurologic:  No visual changes, wkns, changes in mental status. All other systems reviewed and are otherwise negative except as noted above.  Physical Exam  Blood pressure 154/95, pulse 65, temperature 98 F (36.7 C), temperature source Oral, resp. rate 16, height 6\' 1"  (1.854 m), weight 190 lb (86.183 kg), SpO2 95 %.  General: Pleasant, NAD Psych: Normal affect. Neuro: Alert and oriented X 3. Moves all extremities spontaneously. HEENT: Trouble with L side peripheral vision  Neck: Supple without bruits or JVD. Lungs:  Resp regular and unlabored, CTA. Heart: RRR no s3, s4, or murmurs. Abdomen: Soft, non-tender, non-distended,  BS + x 4.  Extremities: No clubbing, cyanosis or edema. DP/PT/Radials 2+ and equal bilaterally.  Labs   Recent Labs  08/13/14 0241 08/13/14 1020  TROPONINI <0.30 <0.30   Lab Results  Component Value Date   WBC 6.2 08/14/2014   HGB 14.5 08/14/2014   HCT 43.0 08/14/2014   MCV 93.1 08/14/2014   PLT 117* 08/14/2014    Recent Labs Lab 08/14/14 0253  NA 140  K 3.6*  CL 102  CO2 23  BUN 17  CREATININE 0.99  CALCIUM 9.4  PROT 6.5  BILITOT 1.2  ALKPHOS 47  ALT 24  AST 22  GLUCOSE 120*   No results found for: CHOL, HDL, LDLCALC, TRIG No results found for: DDIMER  Radiology/Studies  Ct Head Wo Contrast  08/13/2014   CLINICAL DATA:  Fall with intra cerebral hemorrhage.  Follow-up.  EXAM: CT HEAD WITHOUT CONTRAST  TECHNIQUE: Contiguous axial images were obtained from the base of the skull through the vertex without intravenous contrast.  COMPARISON:  08/12/2014  FINDINGS: Right occipital parenchymal hemorrhage is again noted, measuring 2.1 cm in greatest diameter, stable since prior study. Small amount of surrounding edema also stable. No midline shift. No new hemorrhage or hydrocephalus. No acute infarction  Visualized paranasal sinuses and mastoids clear. Orbital soft tissues unremarkable.  IMPRESSION: Stable right occipital intra cerebral hemorrhage.   Electronically Signed   By: Rolm Baptise M.D.   On: 08/13/2014 09:01   Ct Head Wo Contrast  08/12/2014   CLINICAL DATA:  Trauma. Fall. Head injury. Anticoagulated patient. Initial encounter.  EXAM: CT HEAD WITHOUT CONTRAST  CT CERVICAL SPINE WITHOUT CONTRAST  TECHNIQUE: Multidetector CT imaging of the head and cervical spine was performed following the standard protocol without intravenous contrast. Multiplanar CT image reconstructions of the cervical spine were also generated.  COMPARISON:  11/02/2012 head CT.  FINDINGS: CT HEAD FINDINGS  There is a small parenchymal hemorrhage in the RIGHT occipital lobe that measures 2.2 cm x 1.5  cm. Small amount of surrounding edema. In the setting of trauma and anti coagulation, this is most compatible with hemorrhagic parenchymal contusion. Scattered lacunar infarcts are present along with mild atrophy and chronic ischemic white matter disease. Posterior fossa structures appear within normal limits. There is no hydrocephalus or intraventricular blood.  Mastoid air cells are clear. No midline shift. No mass lesion. The calvarium is intact.  CT CERVICAL SPINE FINDINGS  Cervical spinal alignment is within normal limits. Prevertebral soft tissues are normal. C5-C6 ACDF with solid fusion. Odontoid intact. Occipital condyles appear intact. Vertebral artery atherosclerosis incidentally noted. Lung apices show dependent atelectasis. Dual lead RIGHT subclavian cardiac pacemaker leads partially visualized.  IMPRESSION: 1. Small RIGHT occipital lobe parenchymal hemorrhage, most compatible with hemorrhagic contusion. 2. No acute abnormality in the cervical spine. Solid C5-C6 fusion with ACDF plate.   Electronically Signed   By: Dereck Ligas M.D.   On: 08/12/2014 20:18   Ct Chest Wo Contrast  08/12/2014   CLINICAL DATA:  Fall and complains of left rib pain.  EXAM: CT CHEST WITHOUT CONTRAST  TECHNIQUE: Multidetector CT imaging of the chest was performed following the standard protocol without IV contrast.  COMPARISON:  CT 10/05/2011  FINDINGS: Ascending thoracic aorta is enlarged measuring 4.2 cm and similar to the previous examination. Coronary arteries are heavily calcified. Patient has a cardiac pacemaker. No evidence for chest lymphadenopathy. No significant pericardial or pleural fluid. There is contrast in the renal collecting systems consistent with a recent abdominal CT. No acute abnormality in the upper abdomen.  The trachea and mainstem bronchi are patent. There are patchy densities in the lower lobes bilaterally. Findings could represent atelectasis or dependent edema. Stable 4 mm nodule in the right  middle lobe on sequence 3, image 31. This is likely benign based on the stability. There is also a stable 4 mm nodule in the right upper lobe. Punctate nodule in the left upper lobe on sequence 3, image 18 appears stable.  Shoulders are located bilaterally. There is a nondisplaced fracture of the left posterior ninth rib. Nondisplaced fracture of the left posterior tenth rib. Minimally displaced fracture of the left eleventh rib. There is also a fracture involving the left twelfth rib.  IMPRESSION: Fractures of the left ninth, tenth, eleventh and twelfth ribs. Negative for a pneumothorax.  Densities throughout the lower lobes bilaterally.  Findings could represent a combination of atelectasis and/or dependent edema.  Stable small pulmonary nodules as described.  Stable enlargement of the ascending thoracic aorta measuring up to 4.2 cm. Recommend surveillance of the ascending thoracic aorta.  Coronary artery calcifications.   Electronically Signed   By: Markus Daft M.D.   On: 08/12/2014 22:42   Ct Cervical Spine Wo Contrast  08/12/2014   CLINICAL DATA:  Trauma. Fall. Head injury. Anticoagulated patient. Initial encounter.  EXAM: CT HEAD WITHOUT CONTRAST  CT CERVICAL SPINE WITHOUT CONTRAST  TECHNIQUE: Multidetector CT imaging of the head and cervical spine was performed following the standard protocol without intravenous contrast. Multiplanar CT image reconstructions of the cervical spine were also generated.  COMPARISON:  11/02/2012 head CT.  FINDINGS: CT HEAD FINDINGS  There is a small parenchymal hemorrhage in the RIGHT occipital lobe that measures 2.2 cm x 1.5 cm. Small amount of surrounding edema. In the setting of trauma and anti coagulation, this is most compatible with hemorrhagic parenchymal contusion. Scattered lacunar infarcts are present along with mild atrophy and chronic ischemic white matter disease. Posterior fossa structures appear within normal limits. There is no hydrocephalus or intraventricular  blood.  Mastoid air cells are clear. No midline shift. No mass lesion. The calvarium is intact.  CT CERVICAL SPINE FINDINGS  Cervical spinal alignment is within normal limits. Prevertebral soft tissues are normal. C5-C6 ACDF with solid fusion. Odontoid intact. Occipital condyles appear intact. Vertebral artery atherosclerosis incidentally noted. Lung apices show dependent atelectasis. Dual lead RIGHT subclavian cardiac pacemaker leads partially visualized.  IMPRESSION: 1. Small RIGHT occipital lobe parenchymal hemorrhage, most compatible with hemorrhagic contusion. 2. No acute abnormality in the cervical spine. Solid C5-C6 fusion with ACDF plate.   Electronically Signed   By: Dereck Ligas M.D.   On: 08/12/2014 20:18   Ct Abdomen Pelvis W Contrast  08/12/2014   CLINICAL DATA:  63 year old male with history of trauma from a fall complaining of left-sided abdominal pain.  EXAM: CT ABDOMEN AND PELVIS WITH CONTRAST  TECHNIQUE: Multidetector CT imaging of the abdomen and pelvis was performed using the standard protocol following bolus administration of intravenous contrast.  CONTRAST:  111mL OMNIPAQUE IOHEXOL 300 MG/ML  SOLN  COMPARISON:  CT of the abdomen 11/19/2009.  FINDINGS: Lower chest: 4 mm nodule in the right middle lobe (image 3 of series 205), unchanged compared to the prior examination; this can be considered benign and does not require imaging followup. Small amount of dependent atelectasis in the lower lobes of the lungs bilaterally. Atherosclerotic calcifications in the left anterior descending, left circumflex and right coronary arteries. Pacemaker leads terminating in the right atrial appendage and right ventricular apex.  Hepatobiliary: No focal cystic or solid hepatic lesions. No intra or extrahepatic biliary ductal dilatation. Gallbladder is normal in appearance. No signs of acute traumatic injury to the liver.  Pancreas: Unremarkable.  Spleen: No signs of acute traumatic injury to the spleen.  Unremarkable in appearance.  Adrenals/Urinary Tract: Bilateral adrenal glands and bilateral kidneys are normal in appearance. Specifically, no evidence of acute traumatic injury to either kidney. No hydroureteronephrosis or perinephric stranding to suggest urinary tract obstruction at this time. Urinary bladder is normal in appearance.  Stomach/Bowel: Normal appearance of the stomach. No pathologic dilatation of small bowel or colon. Normal appendix.  Vascular/Lymphatic: No signs of acute traumatic injury to the major abdominal or pelvic vasculature. Fusiform ectasia of the infrarenal abdominal aorta which measures up to 2.8 x 2.5 cm. No pathologically enlarged lymph  nodes are noted in the abdomen or pelvis.  Reproductive: Prostate gland is unremarkable in appearance.  Other: No high attenuation fluid collections within the peritoneal cavity or retroperitoneum to suggest significant posttraumatic hemorrhage. No significant volume of ascites. No pneumoperitoneum.  Musculoskeletal: No acute displaced fractures or aggressive appearing lytic or blastic lesions are noted in the visualized portions of the skeleton.  IMPRESSION: 1. No signs of significant acute traumatic injury to the abdomen or pelvis. 2. No acute findings. 3. Atherosclerosis, including multivessel coronary artery disease as well as fusiform ectasia of the infrarenal abdominal aorta which measures up to 2.8 x 2.5 cm. Assessment for potential risk factor modification, dietary therapy or pharmacologic therapy may be warranted, if clinically indicated. 4. Additional incidental findings, as above.   Electronically Signed   By: Vinnie Langton M.D.   On: 08/12/2014 20:20   Dg Chest Portable 1 View  08/12/2014   CLINICAL DATA:  [place] from ladder.  Landed on left side.  EXAM: PORTABLE CHEST - 1 VIEW  COMPARISON:  05/31/2011  FINDINGS: Right cardiac pacemaker. Prominent perihilar structures could represent vascular congestion. Specifically, there are increased  densities in the right paratracheal region which are new. Surgical plate in the lower cervical spine. Negative for a pneumothorax. Heart size is normal.  IMPRESSION: Prominent perihilar structures, particularly in the right paratracheal region. Findings probably represent central vascular congestion. Consider follow-up to ensure resolution.   Electronically Signed   By: Markus Daft M.D.   On: 08/12/2014 19:11   Dg Shoulder Left  08/12/2014   CLINICAL DATA:  63 year old male with history of trauma from a fall off a ladder onto a wood deck complaining of left shoulder pain.  EXAM: LEFT SHOULDER - 2+ VIEW  COMPARISON:  No priors.  FINDINGS: There is no evidence of fracture or dislocation. There is no evidence of arthropathy or other focal bone abnormality. Soft tissues are unremarkable. Orthopedic fixation hardware in the lower cervical spine incidentally noted.  IMPRESSION: Negative.   Electronically Signed   By: Vinnie Langton M.D.   On: 08/12/2014 19:30   Dg Hand Complete Left  08/12/2014   CLINICAL DATA:  Fall from ladder. Fall on wooden deck. LEFT hand pain. Initial encounter.  EXAM: LEFT HAND - COMPLETE 3+ VIEW  COMPARISON:  None.  FINDINGS: There is no evidence of fracture or dislocation. There is no evidence of arthropathy or other focal bone abnormality. Soft tissues are unremarkable. Patient was unable to remove ring from the ring finger.  IMPRESSION: Negative.   Electronically Signed   By: Dereck Ligas M.D.   On: 08/12/2014 19:34    ECG  EKG showed normal sinus rhythm heart rate of 70s, right bundle branch block, nonspecific T wave inversion.  ASSESSMENT AND PLAN  1. ?syncope  - patient unclear if had syncope and fell or tripped and fell. Medtronic PPM interrogated in ED, did not see the full report in the patient's chart, however per ED physician, did not reveal significant arrythmia  - possible etiology include neurogenic, vasovagal, orthostatic (unlikely as pt had no change of body  position at the time)  - does reveal some coronary atherosclerosis on CT of abdomen, however no obvious sign of ACS (serial trop negative, EKG shows nonspecific changes, RBBB old)  - pending echo report. If neurocardiogenic syncope, would keep patient hydrated. However maybe mechanical fall as well  2. R occipital bleed 2/2 fall 3. sinus nodal dysfunction s/p Medtronic PPM 4. HTN 5. HLD 6. CVA 7.  PAF on chronic coumadin - rate controlled  - coumadin on hold, INR reversed with vit K and FFP given R occipital bleed 8. Ascending aorta enlargement: 4.2 cm 9. fusiform ectasia of the infrarenal abdominal aorta 2.8 x 2.5 cm  Signed, Almyra Deforest, PA-C 08/14/2014, 4:28 PM   Personally seen and examined. Agree with above.  Patient usually has prodrome of diaphoresis prior to syncope. Does not recall feeling this during his fall. Admits that he possibly could have "slipped" on the ladder and fell. Recalls that during the flu episode in January, he had a few episodes of syncope with classic neurocardiogenic/vasovagal prodrome. Pacer was interrogated at that time and was normal. He understands the concept of vasodepressor syncope (pacer/ heart rate may be normal).   Would encourage adequate hydration and maintain BP on the upper end of normal.   Needs to hold coumadin and may resume when neuro sees fit.   Will forward to Dr. Sallyanne Kuster who has seen him in clinic.    Candee Furbish, MD

## 2014-08-14 NOTE — Progress Notes (Signed)
Patient ID: Alex Maddox, male   DOB: [birth year], 63 y.o.   MRN: [id number]    Subjective: L lateral rib pain  Objective: Vital signs in last 24 hours: Temp:  [97.4 F (36.3 C)-98.5 F (36.9 C)] 98.3 F (36.8 C) (11/05 0731) Pulse Rate:  [60-77] 60 (11/05 0731) Resp:  [13-19] 13 (11/05 0731) BP: (128-165)/(77-96) 165/96 mmHg (11/05 0731) SpO2:  [95 %-98 %] 96 % (11/05 0731) Weight:  [190 lb (86.183 kg)] 190 lb (86.183 kg) (11/04 1317) Last BM Date: 08/12/14  Intake/Output from previous day: 11/04 0701 - 11/05 0700 In: 250 [P.O.:250] Out: 875 [Urine:875] Intake/Output this shift: Total I/O In: -  Out: 350 [Urine:350]  General appearance: cooperative Resp: clear to auscultation bilaterally Chest wall: left sided chest wall tenderness Cardio: regular rate and rhythm GI: soft, NT  Lab Results: CBC   Recent Labs  08/13/14 0241 08/14/14 0230  WBC 9.0 6.2  HGB 14.6 14.5  HCT 42.2 43.0  PLT 123* 117*   BMET  Recent Labs  08/13/14 0241 08/14/14 0253  NA 140 140  K 3.8 3.6*  CL 104 102  CO2 25 23  GLUCOSE 114* 120*  BUN 16 17  CREATININE 0.96 0.99  CALCIUM 9.1 9.4   PT/INR  Recent Labs  08/13/14 1020 08/14/14 0253  LABPROT 17.9* 16.4*  INR 1.46 1.31    Anti-infectives: Anti-infectives    None      Assessment/Plan: Fall TBI/ICH - F/U ST stable, no coumadin until F/U with Dr. Kathyrn Sheriff, TBI team therapies Multiple L rib FXs - pulmonary toilet, did 750 on IS, encouraged to work on that  Afib/syncope W/U - per primary team  LOS: 2 days    Georganna Skeans, MD, MPH, FACS Trauma: [phone] General Surgery: [phone]  08/14/2014

## 2014-08-15 DIAGNOSIS — S0990XA Unspecified injury of head, initial encounter: Secondary | ICD-10-CM | POA: Insufficient documentation

## 2014-08-15 DIAGNOSIS — W19XXXD Unspecified fall, subsequent encounter: Secondary | ICD-10-CM

## 2014-08-15 DIAGNOSIS — Z950 Presence of cardiac pacemaker: Secondary | ICD-10-CM

## 2014-08-15 LAB — CBC
HCT: 41.1 % (ref 39.0–52.0)
Hemoglobin: 14 g/dL (ref 13.0–17.0)
MCH: 30.8 pg (ref 26.0–34.0)
MCHC: 34.1 g/dL (ref 30.0–36.0)
MCV: 90.3 fL (ref 78.0–100.0)
PLATELETS: 109 10*3/uL — AB (ref 150–400)
RBC: 4.55 MIL/uL (ref 4.22–5.81)
RDW: 12.4 % (ref 11.5–15.5)
WBC: 5.9 10*3/uL (ref 4.0–10.5)

## 2014-08-15 LAB — BASIC METABOLIC PANEL
Anion gap: 11 (ref 5–15)
BUN: 17 mg/dL (ref 6–23)
CO2: 25 mEq/L (ref 19–32)
CREATININE: 1.01 mg/dL (ref 0.50–1.35)
Calcium: 9.2 mg/dL (ref 8.4–10.5)
Chloride: 103 mEq/L (ref 96–112)
GFR calc Af Amer: >90 mL/min (ref >90)
GFR, EST NON AFRICAN AMERICAN: 78 mL/min — AB (ref >90)
GLUCOSE: 118 mg/dL — AB (ref 70–99)
Potassium: 3.6 mEq/L — ABNORMAL LOW (ref 3.7–5.3)
Sodium: 139 mEq/L (ref 137–147)

## 2014-08-15 MED ORDER — ARTIFICIAL TEARS OP OINT
TOPICAL_OINTMENT | Freq: Four times a day (QID) | OPHTHALMIC | Status: DC | PRN
  Administered 2014-08-15 – 2014-08-16 (×2): via OPHTHALMIC
  Filled 2014-08-15 (×2): qty 3.5

## 2014-08-15 MED ORDER — POTASSIUM CHLORIDE CRYS ER 20 MEQ PO TBCR
40.0000 meq | EXTENDED_RELEASE_TABLET | Freq: Once | ORAL | Status: AC
  Administered 2014-08-15: 40 meq via ORAL
  Filled 2014-08-15: qty 2

## 2014-08-15 NOTE — Progress Notes (Signed)
Echo yesterday was unhelpful, due to poor echo windows. This may have been a fall, rather than a true syncopal episode. Will need to leave off of warfarin until follow-up with neurosurgeon in 1 month. No further cardiac suggestions at this time. Follow-up with Dr. Drue Stager, MD, [facility] Attending Cardiologist [place]

## 2014-08-15 NOTE — Evaluation (Signed)
Speech Language Pathology Evaluation Patient Details Name: Alex Maddox MRN: [id number] DOB: Jun 10, 1951 Today's Date: 08/15/2014 Time: 0930-1000 SLP Time Calculation (min): 30 min  Problem List:  Patient Active Problem List   Diagnosis Date Noted  . Head injury   . Fall   . ICH (intracerebral hemorrhage)   . HLD (hyperlipidemia)   . Essential hypertension   . Syncope 08/13/2014  . Intracranial hemorrhage 08/13/2014  . Anticoagulated on Coumadin   . Left rib fracture 08/12/2014  . Encounter for therapeutic drug monitoring 11/07/2013  . Neurocardiogenic syncope 08/26/2013  . Pacemaker - Medtronic REVO dual-chamber implanted August 2012 08/26/2013  . HTN (hypertension) 08/26/2013  . Hyperlipidemia 08/26/2013  . Erectile dysfunction 08/26/2013  . Atrial fibrillation 12/14/2012  . Long term (current) use of anticoagulants 12/14/2012   Past Medical History:  Past Medical History  Diagnosis Date  . Hypertension   . Stroke   . Paroxysmal atrial fibrillation   . Dyslipidemia   . Tachycardia-bradycardia   . Sinus node dysfunction   . Syncope     2D Echocardiogram 09/27/2010 EF between 40 to 45%  . Transient ischemic attack (TIA) 06/07  . Sleep apnea, obstructive     sleep study 10/09/2007  AHI 5.73/hr  REM AHI 11.90/hr   . Abnormal echocardiogram     stress myoview 10/29/2010  . Arthritis     " IN MY BACK "  . 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    LEFT THUMB   . Presence of permanent cardiac pacemaker    Past Surgical History:  Past Surgical History  Procedure Laterality Date  . Pacemaker insertion  05/30/2011    Medtronic Revo   last checked 11/07/2012  . Loop recorder implant  02/02/2011    Medtronic- CRM   . Insert / replace / remove pacemaker  05/2011   HPI:  This 63 y.o. male admitted after losing consciousness and falling off ladder while working on his gutters.  Head CT showed small Rt occiptal hemorrhage.  Pt also with rib fractures.  PMH: includes  h/o recurrent neurocardiogenic syncope and sinus node dysfunction; s/p pacemaker placement; A-Fib (on Coumadin); HTN; Hyperlipidemia; hypertrophic cardiomyopathy.     Assessment / Plan / Recommendation Clinical Impression  Cognitive-linguistic evaluation completed.  Patient completed [place] Cognitive Assessment [facility]). Patient demonstrated mild word and memory retrieval difficulty which wife confirmed was present as baseline.  Attention, awareness, problem solving, processing and direction following were all WFL.  As a result, no further skilled SLP services are warranted at this time.  SLP educated patient and wife regarding s/s to look out for after discharge when trying learn new information that requires mental flexibility and are aware that follow up outpatient services are available if need arises.       SLP Assessment  Patient does not need any further Speech Lanaguage Pathology Services    Follow Up Recommendations  None            Pertinent Vitals/Pain Pain Assessment: No/denies pain   SLP Goals  Progression toward goals:  (evaluation ) Patient/Family Stated Goal: to get eye drops for his dry eyes; RN notified  SLP Evaluation Prior Functioning  Cognitive/Linguistic Baseline: Baseline deficits Baseline deficit details: mild word finding difficulty at baseline Type of Home: House  Lives With: Spouse Available Help at Discharge: Family;Friend(s);Available PRN/intermittently Vocation: Full time employment   Cognition  Overall Cognitive Status: Within Functional Limits for tasks assessed Arousal/Alertness: Awake/alert Orientation Level: Oriented X4 Attention: Selective Selective  Attention: Appears intact Memory: Appears intact Awareness: Appears intact Problem Solving: Appears intact Safety/Judgment: Appears intact [employer] Scales of Cognitive Functioning: Purposeful/appropriate    Comprehension  Auditory Comprehension Overall Auditory Comprehension: Appears  within functional limits for tasks assessed Visual Recognition/Discrimination Discrimination: Not tested Reading Comprehension Reading Status: Not tested    Expression Expression Primary Mode of Expression: Verbal Verbal Expression Overall Verbal Expression: Other (comment) (wife confirmed pt at baseline) Written Expression Dominant Hand: Right Written Expression: Not tested   Oral / Motor Oral Motor/Sensory Function Overall Oral Motor/Sensory Function: Appears within functional limits for tasks assessed Motor Speech Overall Motor Speech: Appears within functional limits for tasks assessed   GO     Carmelia Roller., CCC-SLP [phone]  Brynn Mulgrew 08/15/2014, 10:46 AM

## 2014-08-15 NOTE — Progress Notes (Signed)
[facility] TEAM 1 - Stepdown/ICU TEAM Progress Note  Alex Maddox:[id number] DOB: [birth year] DOA: 08/12/2014 PCP: Leonides Grills, MD  Admit HPI / Brief Narrative: 63 y.o. WM PMHx recurrent neurocardiogenic syncope and sinus node dysfunction status post pacemaker placement, paroxysmal atrial fibrillation on Coumadin, hypertension, hyperlipidemia and hypertrophic cardiomyopathy was brought to the ER after patient fell while trying to work on his house gutter. Patient was on his ladder when suddenly he lost consciousness and fell onto the deck. By the time patient's wife saw the patient he had regained consciousness.   In the ER CT head showed small occipital hemorrhage and rib fractures. On-call Neurosurgeon and Trauma Surgeons were consulted. Patient after falling did have some headache mostly in the frontal area but denied nausea vomiting or any weakness of the upper or lower extremities. Patient stated he did not have any prodrome before he lost his consciousness. As per the ER physician patient's pacemaker was interrogated and was unremarkable. Patient was on Coumadin and was therefore given FFP and Kcentra.  HPI/Subjective:  Mild generalized headache, no chest abdominal pain or focal weakness. Some blurriness in vision off and on but it's getting better.  Assessment/Plan:  Syncope with history of sinus node dysfunction status post pacemaker placement stable on tele - this is a chronic issue - formal cardiology consult obtained outpatient follow-up with primary cardiologist Dr. Sallyanne Kuster  postdischarge per cardiology. He now claims that he possibly slipped from a ladder while cleaning gutters.   Intracranial bleed F/u CT head notes stability   Rib fracture patient has been placed on incentive spirometer - Trauma Surgery following -pain not well controlled; cont PO Oxy and IV Dilaudid for BT pain- add IV Toradol on schedule q 8 hrs for 2 days only- add PPI-begin PT/OT  History of  paroxysmal atrial fibrillation On Coreg, cardiology seen formally. No further inpatient testing. Holding Coumadin for intracranial bleed.  Hypertension Stable on Coreg and ARB  Hyperlipidemia continue home medications.  Low potassium.  Replaced     Code Status: FULL Family Communication: no family present at time of exam Disposition Plan: TBD, increase activity PT eval and treat.  Consultants: NS/Dr. Kathyrn Sheriff Trauma/Dr. [facility] [place]  Procedures:  2D ECHO -  - Left ventricle: Extremely poor apical windows limit studly LV systolic function appears normal to mildly decreased. Cannot fully evaluate regional wall motion. Would recomm limited echo with contrast to evaluate further. The cavity size was normal. Doppler parameters are consistent with abnormal left ventricular relaxation (grade 1 diastolic dysfunction).   Antibiotics: none  DVT prophylaxis: SCDs  Objective: Blood pressure 156/91, pulse 60, temperature 98 F (36.7 C), temperature source Oral, resp. rate 18, height 6\' 1"  (1.854 m), weight 86.183 kg (190 lb), SpO2 99 %.  Intake/Output Summary (Last 24 hours) at 08/15/14 1027 Last data filed at 08/15/14 0900  Gross per 24 hour  Intake    220 ml  Output   1695 ml  Net  -1475 ml   Exam: Gen: No acute respiratory distress Chest: Clear to auscultation bilaterally (somewhat shallow effort) without wheezes, rhonchi or crackles, room air-very tender left chest wall- IS up to 750 Cardiac: Regular rate and sinus rhythm, S1-S2, no rubs murmurs or gallops, no peripheral edema, no JVD Abdomen: Soft nontender nondistended without obvious hepatosplenomegaly, no ascites Extremities: Symmetrical in appearance without cyanosis, clubbing or effusion   Data Reviewed: Basic Metabolic Panel:  Recent Labs Lab 08/12/14 1820 08/12/14 1922 08/13/14 0241 08/14/14 0253 08/15/14 0535  NA  141 142 140 140 139  K 3.8 3.6* 3.8 3.6* 3.6*  CL 103 100 104  102 103  CO2 28  --  25 23 25   GLUCOSE 101* 108* 114* 120* 118*  BUN 16 17 16 17 17   CREATININE 1.06 1.10 0.96 0.99 1.01  CALCIUM 10.0  --  9.1 9.4 9.2  MG  --   --   --  2.1  --   PHOS  --   --   --  2.1*  --     Liver Function Tests:  Recent Labs Lab 08/12/14 1820 08/13/14 0241 08/14/14 0253  AST 33 28 22  ALT 34 28 24  ALKPHOS 57 53 47  BILITOT 0.6 0.9 1.2  PROT 6.9 6.6 6.5  ALBUMIN 3.9 3.5 3.4*    Coags:  Recent Labs Lab 08/12/14 1820 08/13/14 0241 08/13/14 1020 08/14/14 0253  INR 2.32* 1.58* 1.46 1.31    CBC:  Recent Labs Lab 08/12/14 1820 08/12/14 1922 08/13/14 0241 08/14/14 0230 08/15/14 0535  WBC 9.8  --  9.0 6.2 5.9  NEUTROABS 8.1*  --  7.0  --   --   HGB 15.9 16.0 14.6 14.5 14.0  HCT 45.7 47.0 42.2 43.0 41.1  MCV 91.0  --  90.6 93.1 90.3  PLT 136*  --  123* 117* 109*    Cardiac Enzymes:  Recent Labs Lab 08/13/14 0241 08/13/14 1020  TROPONINI <0.30 <0.30   Studies:  Recent x-ray studies have been reviewed in detail by the Attending Physician  Scheduled Meds:  Scheduled Meds: . carvedilol  25 mg Oral BID  . irbesartan  300 mg Oral Daily  . omega-3 acid ethyl esters  1 g Oral Daily  . pantoprazole  40 mg Oral Daily  . potassium chloride  40 mEq Oral Once  . simvastatin  40 mg Oral q1800    Time spent on care of this patient: 40 mins  Lala Lund K M.D on 08/15/2014 at 10:28 AM  Between 7am to 7pm - Pager - [phone], After 7pm go to [email or web address] - password [email or web address]  And look for the night coverage person covering me after hours  [facility]  Office  [phone]

## 2014-08-15 NOTE — Evaluation (Addendum)
Physical Therapy Evaluation Patient Details Name: Alex Maddox MRN: [id number] DOB: 1951-09-19 Today's Date: 08/15/2014   History of Present Illness  This 63 y.o. male admitted after losing consciousness and falling off ladder while working on his gutters.  Head CT showed small Rt occiptal hemorrhage.  Pt also with rib fractures.  PMH: includes h/o recurrent neurocardiogenic syncope and sinus node dysfunction; s/p pacemaker placement; A-Fib (on Coumadin); HTN; Hyperlipidemia; hypertrophic cardiomyopathy.   Clinical Impression  Patient presents with problems listed below.  Will benefit from acute PT to maximize independence prior to discharge home with wife.  Performed limited vestibular evaluation.  Difficulty with some testing due to vision impairments.  No nystagmus or dizziness during any of the testing.  VOR tested normal.  Once episode of lightheadedness toward end of session possibly related to BP.  No vestibular issue noted.  Patient limited due to pain on Lt chest wall due to rib fractures, decreased balance with gait, and decreased vision on left.  Recommend f/u OP PT for mobility and balance training.    Follow Up Recommendations Supervision - Intermittent;Outpatient PT    Equipment Recommendations  Rolling walker with 5" wheels    Recommendations for Other Services       Precautions / Restrictions Precautions Precautions: Fall Precaution Comments: Patient reports dizziness only once toward end of session with sit > stand - resolved quickly. Restrictions Weight Bearing Restrictions: No      Mobility  Bed Mobility Overal bed mobility: Needs Assistance Bed Mobility: Rolling;Sidelying to Sit;Sit to Sidelying Rolling: Min assist Sidelying to sit: Mod assist;HOB elevated     Sit to sidelying: Min assist;HOB elevated General bed mobility comments: Verbal cues for technique.  Increased time for all mobility due to pain with movement.  Physical assist to move trunk to sitting  position.  No c/o dizziness today with position changes.  Transfers Overall transfer level: Needs assistance Equipment used: 2 person hand held assist Transfers: Sit to/from Stand Sit to Stand: Min assist;+2 safety/equipment         General transfer comment: Verbal cues for proper use of UE's to move to standing.  Assist to power up to standing and for balance.  No c/o dizziness during transfers from bed and recliner.  Ambulation/Gait Ambulation/Gait assistance: Min assist;+2 physical assistance Ambulation Distance (Feet): 30 Feet (Required 2 standing rest breaks.  ) Assistive device: 2 person hand held assist Gait Pattern/deviations: Step-through pattern;Decreased stride length;Decreased step length - right;Decreased step length - left;Shuffle;Antalgic;Narrow base of support (Decreased trunk rotation) Gait velocity: Decreased Gait velocity interpretation: Below normal speed for age/gender General Gait Details: Patient with very guarded, slow gait, taking short shuffling steps due to pain.  Patient ambulating 10' x3 with standing rest break between each time.  Was able to take steps backward toward bed and maintain balance.   Stairs            Wheelchair Mobility    Modified Rankin (Stroke Patients Only)       Balance Overall balance assessment: Needs assistance Sitting-balance support: No upper extremity supported;Feet supported Sitting balance-Leahy Scale: Good     Standing balance support: Single extremity supported Standing balance-Leahy Scale: Fair Standing balance comment: Guarded in stance.  Minimizing movement due to Lt rib pain.                             Pertinent Vitals/Pain Pain Assessment: 0-10 Pain Score: 7  Pain Location: Lt flank  with movement Pain Descriptors / Indicators: Sore;Sharp;Aching Pain Intervention(s): Monitored during session;Limited activity within patient's tolerance;Patient requesting pain meds-RN notified    Home  Living Family/patient expects to be discharged to:: Private residence Living Arrangements: Spouse/significant other Available Help at Discharge: Family;Friend(s);Available PRN/intermittently Type of Home: House Home Access: Stairs to enter Entrance Stairs-Rails: None Entrance Stairs-Number of Steps: 2 small steps Home Layout: One level Home Equipment: None      Prior Function Level of Independence: Independent         Comments: Pt  works as a Curator for cigarette packaging.  Requires frequent heavy lifting      Hand Dominance   Dominant Hand: Right    Extremity/Trunk Assessment   Upper Extremity Assessment: Overall WFL for tasks assessed (Difficult to assess due to rib fx's)           Lower Extremity Assessment: Overall WFL for tasks assessed      Cervical / Trunk Assessment: Other exceptions  Communication   Communication: No difficulties  Cognition Arousal/Alertness: Awake/alert Behavior During Therapy: WFL for tasks assessed/performed Overall Cognitive Status: Within Functional Limits for tasks assessed                      General Comments      Vestibular Evaluation: Oculomotor:  During smooth pursuits, patient loses target on left side just past midline due to decreased vision on left.  Is able to actively move eyes toward left side.  Was able to move eyes to both sides and find target during saccades. VOR tested normal to both sides. Roll Test - Normal to both sides. Hallpike-Dix:  Unable to formally test due to rib fractures.  Had patient in supine and placed bed in trendelenberg with head turned first to right - no dizziness or nystagmus elicited.  Returned bed to level position and had patient turn head to left.  Repeated trendelenberg position of bed - no nystagmus or dizziness noted.      Assessment/Plan    PT Assessment Patient needs continued PT services  PT Diagnosis Difficulty walking;Acute pain   PT Problem  List Decreased strength;Decreased activity tolerance;Decreased balance;Decreased mobility;Decreased knowledge of use of DME;Pain  PT Treatment Interventions DME instruction;Gait training;Stair training;Functional mobility training;Therapeutic activities;Balance training;Patient/family education   PT Goals (Current goals can be found in the Care Plan section) Acute Rehab PT Goals Patient Stated Goal: To decrease pain.  To get better PT Goal Formulation: With patient/family Time For Goal Achievement: 08/22/14 Potential to Achieve Goals: Good    Frequency Min 4X/week   Barriers to discharge Decreased caregiver support Patient does not have 24 hour assist available at discharge - wife works.    Co-evaluation               End of Session Equipment Utilized During Treatment: Gait belt Activity Tolerance: Patient limited by pain Patient left: in chair;with call bell/phone within reach;with chair alarm set;with family/visitor present Nurse Communication: Mobility status;Patient requests pain meds         Time: 7371-0626 PT Time Calculation (min): 45 min   Charges:   PT Evaluation $Initial PT Evaluation Tier I: 1 Procedure PT Treatments $Gait Training: 8-22 mins $Therapeutic Activity: 8-22 mins   PT G Codes:          Despina Pole 08/15/2014, 5:44 PM Carita Pian. Sanjuana Kava, [place] Pager [phone]

## 2014-08-15 NOTE — Progress Notes (Signed)
Patient ID: LEAF KERNODLE, male   DOB: 02/14/1951, 63 y.o.   MRN: [id number]   LOS: 3 days   Subjective: Doing ok, pain controlled.   Objective: Vital signs in last 24 hours: Temp:  [97.8 F (36.6 C)-98.3 F (36.8 C)] 98.3 F (36.8 C) (11/06 0604) Pulse Rate:  [59-66] 59 (11/06 0604) Resp:  [16-20] 20 (11/06 0604) BP: (113-163)/(63-95) 113/63 mmHg (11/06 0604) SpO2:  [95 %-98 %] 96 % (11/06 0604) Last BM Date: 08/12/14   IS: 1067ml (+2106ml)   Laboratory  CBC  Recent Labs  08/14/14 0230 08/15/14 0535  WBC 6.2 5.9  HGB 14.5 14.0  HCT 43.0 41.1  PLT 117* 109*   BMET  Recent Labs  08/14/14 0253 08/15/14 0535  NA 140 139  K 3.6* 3.6*  CL 102 103  CO2 23 25  GLUCOSE 120* 118*  BUN 17 17  CREATININE 0.99 1.01  CALCIUM 9.4 9.2    Physical Exam General appearance: alert and no distress Resp: clear to auscultation bilaterally Cardio: regular rate and rhythm GI: Soft, mild TTP LLQ, +BS   Assessment/Plan: Fall TBI/ICH - F/U CT stable, no coumadin until F/U with Dr. Kathyrn Sheriff, TBI team therapies. Would not advise Toradol, will d/c. Multiple L rib FXs - pulmonary toilet Afib/syncope W/U - per primary team    Lisette Abu, PA-C Pager: [phone] General Trauma PA Pager: [phone]  08/15/2014

## 2014-08-15 NOTE — Progress Notes (Signed)
Occupational Therapy Treatment Patient Details Name: Alex Maddox MRN: [id number] DOB: 03-11-1951 Today's Date: 08/15/2014    History of present illness This 63 y.o. male admitted after losing consciousness and falling off ladder while working on his gutters.  Head CT showed small Rt occiptal hemorrhage.  Pt also with rib fractures.  PMH: includes h/o recurrent neurocardiogenic syncope and sinus node dysfunction; s/p pacemaker placement; A-Fib (on Coumadin); HTN; Hyperlipidemia; hypertrophic cardiomyopathy.    OT comments  Pt with significant improvement today, but is still very guarded with basic mobility.  He is able to perform BADLs at min A level, but moves very slowly.   Continues with Lt. Inferior quadrantopsia. Recommend OPOT  Follow Up Recommendations  Outpatient OT;Supervision/Assistance - 24 hour    Equipment Recommendations  None recommended by OT    Recommendations for Other Services      Precautions / Restrictions Precautions Precautions: Fall Precaution Comments: Patient reports dizziness only once toward end of session with sit > stand - resolved quickly. Restrictions Weight Bearing Restrictions: No       Mobility Bed Mobility Overal bed mobility: Needs Assistance Bed Mobility: Rolling;Sidelying to Sit;Sit to Sidelying Rolling: Min assist Sidelying to sit: Mod assist;HOB elevated     Sit to sidelying: Min assist;HOB elevated General bed mobility comments: Verbal cues for technique.  Increased time for all mobility due to pain with movement.  Physical assist to move trunk to sitting position.  No c/o dizziness today with position changes.  Transfers Overall transfer level: Needs assistance Equipment used: 1 person hand held assist Transfers: Sit to/from [facility] Sit to Stand: Min assist Stand pivot transfers: Min assist       General transfer comment: min/HHA.  Pt very guarded.  Takes short, shuffling steps when ambulating      Balance Overall balance assessment: Needs assistance Sitting-balance support: No upper extremity supported;Feet supported Sitting balance-Leahy Scale: Good     Standing balance support: Single extremity supported Standing balance-Leahy Scale: Fair Standing balance comment: Guarded in stance.  Minimizing movement due to Lt rib pain.                   ADL                       Lower Body Dressing: Minimal assistance;Sit to/from stand   Toilet Transfer: Minimal assistance;Ambulation;Comfort height toilet             General ADL Comments: Pt able to don/doff socks by crossing ankles over knees.  He moves very slowly with all activity due to pain.  He ambulated in hallway with min a, but unable to challenge vision as pt is very guarded at this time       Vision Eye Alignment: Within Functional Limits   Ocular Range of Motion: Within Functional Limits Tracking/Visual Pursuits: Able to track stimulus in all quads without difficulty Saccades: Decreased speed of saccadic movement (in Lt inferior quadrant)       Additional Comments: Pt with minimal decrease with saccades in Lt inferior quadrant.  Pt is able to read without difficulty today.  Unable to assess scanning in unstructured environement as pt is very guarded with all movement due to fear of pain    Perception     Praxis      Cognition   Behavior During Therapy: [facility] for tasks assessed/performed Overall Cognitive Status: Within Functional Limits for tasks assessed  Extremity/Trunk Assessment  Upper Extremity Assessment Upper Extremity Assessment: Overall WFL for tasks assessed (Difficult to assess due to rib fx's)   Lower Extremity Assessment Lower Extremity Assessment: Overall WFL for tasks assessed   Cervical / Trunk Assessment Cervical / Trunk Assessment: Other exceptions Cervical / Trunk Exceptions: Decreased movement/rotation due to rib fractures    Exercises  Other Exercises Other Exercises: Oculomotor:  During smooth pursuits, patient loses target on left side just past midline due to decreased vision on left.  Is able to actively move eyes toward left side.  Was able to move eyes to both sides and find target during saccades. Other Exercises: VOR tested normal to both sides. Other Exercises: Roll Test - Normal to both sides. Other Exercises: Hallpike-Dix:  Unable to formally test due to rib fractures.  Had patient in supine and placed bed in trendelenberg with head turned first to right - no dizziness or nystagmus elicited.  Returned bed to level position and had patient turn head to left.  Repeated trendelenberg position of bed - no nystagmus or dizziness noted.   Shoulder Instructions       General Comments      Pertinent Vitals/ Pain       Pain Assessment: 0-10 Pain Score: 5  Pain Location: Lt ribs with movement  Pain Descriptors / Indicators: Aching Pain Intervention(s): Monitored during session;Premedicated before session;Repositioned  Home Living Family/patient expects to be discharged to:: Private residence Living Arrangements: Spouse/significant other Available Help at Discharge: Family;Friend(s);Available PRN/intermittently Type of Home: House Home Access: Stairs to enter [employer] of Steps: 2 small steps Entrance Stairs-Rails: None Home Layout: One level               Home Equipment: None      Lives With: Spouse    Prior Functioning/Environment Level of Independence: Independent        Comments: Pt  works as a Curator for cigarette packaging.  Requires frequent heavy lifting    Frequency Min 2X/week     Progress Toward Goals  OT Goals(current goals can now be found in the care plan section)  Progress towards OT goals: Progressing toward goals  Acute Rehab OT Goals Patient Stated Goal: To decrease pain.  To get better ADL Goals Pt Will Perform Grooming: with  supervision;standing Pt Will Perform Upper Body Bathing: with set-up;sitting Pt Will Perform Lower Body Bathing: with supervision;sit to/from stand Pt Will Perform Upper Body Dressing: with supervision;sitting Pt Will Perform Lower Body Dressing: with supervision;sit to/from stand Pt Will Transfer to Toilet: with supervision;ambulating;regular height toilet;grab bars Pt Will Perform Toileting - Clothing Manipulation and hygiene: with supervision;sit to/from stand Additional ADL Goal #1: Pt will demonstrate good awareness of visual field loss and how it does and may impact him functionally  Additional ADL Goal #2: Pt will participate in further visual and cognitive assessment   Plan Discharge plan remains appropriate    Co-evaluation                 End of Session     Activity Tolerance Patient limited by pain   Patient Left in chair;with call bell/phone within reach;with chair alarm set;with family/visitor present   Nurse Communication Mobility status        Time: 4665-9935 OT Time Calculation (min): 23 min  Charges: OT General Charges $OT Visit: 1 Procedure OT Treatments $Self Care/Home Management : 8-22 mins $Therapeutic Activity: 23-37 mins  Tytianna Greenley M 08/15/2014, 6:26 PM

## 2014-08-16 DIAGNOSIS — S2249XA Multiple fractures of ribs, unspecified side, initial encounter for closed fracture: Secondary | ICD-10-CM | POA: Insufficient documentation

## 2014-08-16 DIAGNOSIS — I481 Persistent atrial fibrillation: Secondary | ICD-10-CM

## 2014-08-16 DIAGNOSIS — S2239XA Fracture of one rib, unspecified side, initial encounter for closed fracture: Secondary | ICD-10-CM | POA: Insufficient documentation

## 2014-08-16 MED ORDER — OXYCODONE HCL 5 MG PO TABS: 5.0000 mg | ORAL_TABLET | Freq: Four times a day (QID) | ORAL | Status: DC | PRN

## 2014-08-16 NOTE — Progress Notes (Signed)
Pt transported out of unit per wheelchair with nurse tech, [place]. No distress noted. Wife at side with belongings.  Alex Maddox I 08/16/2014 4:07 PM

## 2014-08-16 NOTE — Plan of Care (Signed)
Problem: Phase I Progression Outcomes Goal: Pain controlled with appropriate interventions Outcome: Completed/Met Date Met:  08/16/14 Goal: OOB as tolerated unless otherwise ordered Outcome: Completed/Met Date Met:  08/16/14 Goal: Hemodynamically stable Outcome: Completed/Met Date Met:  08/16/14  Problem: Phase II Progression Outcomes Goal: Progress activity as tolerated unless otherwise ordered Outcome: Completed/Met Date Met:  08/16/14 Goal: Discharge plan established Outcome: Completed/Met Date Met:  08/16/14 Goal: Vital signs remain stable Outcome: Completed/Met Date Met:  08/16/14 Goal: IV changed to normal saline lock Outcome: Completed/Met Date Met:  08/16/14 Goal: Obtain order to discontinue catheter if appropriate Outcome: Completed/Met Date Met:  08/16/14

## 2014-08-16 NOTE — Discharge Summary (Signed)
SANTI TROUNG, is a 63 y.o. male  DOB Sep 23, 1951  MRN [id number].  Admission date:  08/12/2014  Admitting Physician  Rise Patience, MD  Discharge Date:  08/16/2014   Primary MD  Leonides Grills, MD  Recommendations for primary care physician for things to follow:   Monitor clinically   Admission Diagnosis  ICH (intracerebral hemorrhage) [I61.9] Fall [W19.XXXA] Intracranial bleed [I62.9] Anticoagulated on Coumadin [Z51.81, Z79.01] Chest pain [R07.9] Fall, initial encounter [W19.XXXA] Head injury, initial encounter [S09.90XA] Rib fractures, left, closed, initial encounter [S22.32XA] Syncope, unspecified syncope type [R55]   Discharge Diagnosis  ICH (intracerebral hemorrhage) [I61.9] Fall [W19.XXXA] Intracranial bleed [I62.9] Anticoagulated on Coumadin [Z51.81, Z79.01] Chest pain [R07.9] Fall, initial encounter [W19.XXXA] Head injury, initial encounter [S09.90XA] Rib fractures, left, closed, initial encounter [S22.32XA] Syncope, unspecified syncope type [R55]    Principal Problem:   Syncope Active Problems:   Atrial fibrillation   Pacemaker - Medtronic REVO dual-chamber implanted August 2012   Left rib fracture   Intracranial hemorrhage   Anticoagulated on Coumadin   Fall   ICH (intracerebral hemorrhage)   HLD (hyperlipidemia)   Essential hypertension   Head injury   Rib fractures      Past Medical History  Diagnosis Date  . Hypertension   . Stroke   . Paroxysmal atrial fibrillation   . Dyslipidemia   . Tachycardia-bradycardia   . Sinus node dysfunction   . Syncope     2D Echocardiogram 09/27/2010 EF between 40 to 45%  . Transient ischemic attack (TIA) 06/07  . Sleep apnea, obstructive     sleep study 10/09/2007  AHI 5.73/hr  REM AHI 11.90/hr   . Abnormal echocardiogram     stress myoview  10/29/2010  . Arthritis     " IN MY BACK "  . 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    LEFT THUMB   . Presence of permanent cardiac pacemaker     Past Surgical History  Procedure Laterality Date  . Pacemaker insertion  05/30/2011    Medtronic Revo   last checked 11/07/2012  . Loop recorder implant  02/02/2011    Medtronic- CRM   . Insert / replace / remove pacemaker  05/2011       History of present illness and  Hospital Course:     Kindly see H&P for history of present illness and admission details, please review complete Labs, Consult reports and Test reports for all details in brief  HPI  from the history and physical done on the day of admission  63 y.o. WM PMHx recurrent neurocardiogenic syncope and sinus node dysfunction status post pacemaker placement, paroxysmal atrial fibrillation on Coumadin, hypertension, hyperlipidemia and hypertrophic cardiomyopathy was brought to the ER after patient fell while trying to work on his house gutter. Patient was on his ladder when suddenly he lost consciousness and fell onto the deck. By the time patient's wife saw the patient he had regained consciousness.   In the ER CT head showed small occipital hemorrhage and  rib fractures. On-call Neurosurgeon and Trauma Surgeons were consulted. Patient after falling did have some headache mostly in the frontal area but denied nausea vomiting or any weakness of the upper or lower extremities. Patient stated he did not have any prodrome before he lost his consciousness. As per the ER physician patient's pacemaker was interrogated and was unremarkable. Patient was on Coumadin and was therefore given FFP and Kcentra.   Hospital Course    Syncope with history of sinus node dysfunction status post pacemaker placement stable on tele - this is a chronic issue - formal cardiology consult obtained outpatient follow-up with primary cardiologist Dr. Sallyanne Kuster postdischarge per cardiology. He now claims  that he possibly slipped from a ladder while cleaning gutters.   Intracranial bleed F/u CT head notes stability, mildly blurry vision from left eye off and on but much improved.   Rib fracture patient has been placed on IS, feels better, discharged on oral pain medication.   History of paroxysmal atrial fibrillation On Coreg, cardiology seen formally. No further inpatient testing. Holding Coumadin for intracranial bleed.Coumadin to be resumed once he sees neurosurgery in the outpatient setting.   Hypertension Stable on Coreg and ARB   Hyperlipidemia continue home medications.   Low potassium.  Replaced, recheck BMP in 5-7 days by PCP.     Discharge Condition: stable   Follow UP  Follow-up Information    Follow up with Leonides Grills, MD. Schedule an appointment as soon as possible for a visit in 1 week.   Specialty:  Family Medicine   Contact information:   [address] [place] [address] [phone]       Follow up with Consuella Lose, C, MD. Schedule an appointment as soon as possible for a visit in 1 week.   Specialty:  Neurosurgery   Contact information:   [address] [place] [id number] [phone]         Discharge Instructions  and  Discharge Medications          Discharge Instructions    Diet - low sodium heart healthy    Complete by:  As directed      Discharge instructions    Complete by:  As directed   Follow with Primary MD Leonides Grills, MD in 7 days   Get CBC, CMP, 2 view Chest X ray checked  by Primary MD next visit.    Activity: As tolerated with Full fall precautions use walker/cane & assistance as needed   Disposition Home     Diet: Heart Healthy    For Heart failure patients - Check your Weight same time everyday, if you gain over 2 pounds, or you develop in leg swelling, experience more shortness of breath or chest pain, call your Primary MD immediately. Follow Cardiac Low  Salt Diet and 1.8 lit/day fluid restriction.   On your next visit with your primary care physician please Get Medicines reviewed and adjusted.   Please request your Prim.MD to go over all Hospital Tests and Procedure/Radiological results at the follow up, please get all Hospital records sent to your Prim MD by signing hospital release before you go home.   If you experience worsening of your admission symptoms, develop shortness of breath, life threatening emergency, suicidal or homicidal thoughts you must seek medical attention immediately by calling 911 or calling your MD immediately  if symptoms less severe.  You Must read complete instructions/literature along with all the possible adverse reactions/side effects for all the  Medicines you take and that have been prescribed to you. Take any new Medicines after you have completely understood and accpet all the possible adverse reactions/side effects.   Do not drive, operating heavy machinery, perform activities at heights, swimming or participation in water activities or provide baby sitting services if your were admitted for syncope or siezures until you have seen by Primary MD or a Neurologist and advised to do so again.  Do not drive when taking Pain medications.    Do not take more than prescribed Pain, Sleep and Anxiety Medications  Special Instructions: If you have smoked or chewed Tobacco  in the last 2 yrs please stop smoking, stop any regular Alcohol  and or any Recreational drug use.  Wear Seat belts while driving.   Please note  You were cared for by a hospitalist during your hospital stay. If you have any questions about your discharge medications or the care you received while you were in the hospital after you are discharged, you can call the unit and asked to speak with the hospitalist on call if the hospitalist that took care of you is not available. Once you are discharged, your primary care physician will handle any  further medical issues. Please note that NO REFILLS for any discharge medications will be authorized once you are discharged, as it is imperative that you return to your primary care physician (or establish a relationship with a primary care physician if you do not have one) for your aftercare needs so that they can reassess your need for medications and monitor your lab values.     Increase activity slowly    Complete by:  As directed             Medication List    STOP taking these medications        warfarin 5 MG tablet  Commonly known as:  COUMADIN      TAKE these medications        carvedilol 25 MG tablet  Commonly known as:  COREG  Take 1 tablet (25 mg total) by mouth 2 (two) times daily.     fish oil-omega-3 fatty acids 1000 MG capsule  Take 1 g by mouth daily.     FLUVIRIN Susp  Generic drug:  Influenza Vac Typ A&B Surf Ant  Inject 1 application into the muscle once.     multivitamin tablet  Take 1 tablet by mouth daily.     oxyCODONE 5 MG immediate release tablet  Commonly known as:  Oxy IR/ROXICODONE  Take 1-2 tablets (5-10 mg total) by mouth every 6 (six) hours as needed for moderate pain.     saw palmetto 160 MG capsule  Take 160 mg by mouth daily.     sildenafil 100 MG tablet  Commonly known as:  VIAGRA  Take 1 tablet (100 mg total) by mouth daily as needed for erectile dysfunction.     simvastatin 40 MG tablet  Commonly known as:  ZOCOR  TAKE ONE (1) TABLET AT BEDTIME     valsartan 320 MG tablet  Commonly known as:  DIOVAN  Take 320 mg by mouth daily.          Diet and Activity recommendation: See Discharge Instructions above   Consults obtained -    NS/Dr. Kathyrn Sheriff Trauma/Dr. [facility]    Major procedures and Radiology Reports - PLEASE review detailed and final reports for all details, in brief -       Ct  Head Wo Contrast  08/13/2014   CLINICAL DATA:  Fall with intra cerebral hemorrhage.  Follow-up.  EXAM: CT HEAD  WITHOUT CONTRAST  TECHNIQUE: Contiguous axial images were obtained from the base of the skull through the vertex without intravenous contrast.  COMPARISON:  08/12/2014  FINDINGS: Right occipital parenchymal hemorrhage is again noted, measuring 2.1 cm in greatest diameter, stable since prior study. Small amount of surrounding edema also stable. No midline shift. No new hemorrhage or hydrocephalus. No acute infarction  Visualized paranasal sinuses and mastoids clear. Orbital soft tissues unremarkable.  IMPRESSION: Stable right occipital intra cerebral hemorrhage.   Electronically Signed   By: Rolm Baptise M.D.   On: 08/13/2014 09:01   Ct Head Wo Contrast  08/12/2014   CLINICAL DATA:  Trauma. Fall. Head injury. Anticoagulated patient. Initial encounter.  EXAM: CT HEAD WITHOUT CONTRAST  CT CERVICAL SPINE WITHOUT CONTRAST  TECHNIQUE: Multidetector CT imaging of the head and cervical spine was performed following the standard protocol without intravenous contrast. Multiplanar CT image reconstructions of the cervical spine were also generated.  COMPARISON:  11/02/2012 head CT.  FINDINGS: CT HEAD FINDINGS  There is a small parenchymal hemorrhage in the RIGHT occipital lobe that measures 2.2 cm x 1.5 cm. Small amount of surrounding edema. In the setting of trauma and anti coagulation, this is most compatible with hemorrhagic parenchymal contusion. Scattered lacunar infarcts are present along with mild atrophy and chronic ischemic white matter disease. Posterior fossa structures appear within normal limits. There is no hydrocephalus or intraventricular blood.  Mastoid air cells are clear. No midline shift. No mass lesion. The calvarium is intact.  CT CERVICAL SPINE FINDINGS  Cervical spinal alignment is within normal limits. Prevertebral soft tissues are normal. C5-C6 ACDF with solid fusion. Odontoid intact. Occipital condyles appear intact. Vertebral artery atherosclerosis incidentally noted. Lung apices show dependent  atelectasis. Dual lead RIGHT subclavian cardiac pacemaker leads partially visualized.  IMPRESSION: 1. Small RIGHT occipital lobe parenchymal hemorrhage, most compatible with hemorrhagic contusion. 2. No acute abnormality in the cervical spine. Solid C5-C6 fusion with ACDF plate.   Electronically Signed   By: Dereck Ligas M.D.   On: 08/12/2014 20:18   Ct Chest Wo Contrast  08/12/2014   CLINICAL DATA:  Fall and complains of left rib pain.  EXAM: CT CHEST WITHOUT CONTRAST  TECHNIQUE: Multidetector CT imaging of the chest was performed following the standard protocol without IV contrast.  COMPARISON:  CT 10/05/2011  FINDINGS: Ascending thoracic aorta is enlarged measuring 4.2 cm and similar to the previous examination. Coronary arteries are heavily calcified. Patient has a cardiac pacemaker. No evidence for chest lymphadenopathy. No significant pericardial or pleural fluid. There is contrast in the renal collecting systems consistent with a recent abdominal CT. No acute abnormality in the upper abdomen.  The trachea and mainstem bronchi are patent. There are patchy densities in the lower lobes bilaterally. Findings could represent atelectasis or dependent edema. Stable 4 mm nodule in the right middle lobe on sequence 3, image 31. This is likely benign based on the stability. There is also a stable 4 mm nodule in the right upper lobe. Punctate nodule in the left upper lobe on sequence 3, image 18 appears stable.  Shoulders are located bilaterally. There is a nondisplaced fracture of the left posterior ninth rib. Nondisplaced fracture of the left posterior tenth rib. Minimally displaced fracture of the left eleventh rib. There is also a fracture involving the left twelfth rib.  IMPRESSION: Fractures of the left ninth,  tenth, eleventh and twelfth ribs. Negative for a pneumothorax.  Densities throughout the lower lobes bilaterally. Findings could represent a combination of atelectasis and/or dependent edema.  Stable  small pulmonary nodules as described.  Stable enlargement of the ascending thoracic aorta measuring up to 4.2 cm. Recommend surveillance of the ascending thoracic aorta.  Coronary artery calcifications.   Electronically Signed   By: Markus Daft M.D.   On: 08/12/2014 22:42   Ct Cervical Spine Wo Contrast  08/12/2014   CLINICAL DATA:  Trauma. Fall. Head injury. Anticoagulated patient. Initial encounter.  EXAM: CT HEAD WITHOUT CONTRAST  CT CERVICAL SPINE WITHOUT CONTRAST  TECHNIQUE: Multidetector CT imaging of the head and cervical spine was performed following the standard protocol without intravenous contrast. Multiplanar CT image reconstructions of the cervical spine were also generated.  COMPARISON:  11/02/2012 head CT.  FINDINGS: CT HEAD FINDINGS  There is a small parenchymal hemorrhage in the RIGHT occipital lobe that measures 2.2 cm x 1.5 cm. Small amount of surrounding edema. In the setting of trauma and anti coagulation, this is most compatible with hemorrhagic parenchymal contusion. Scattered lacunar infarcts are present along with mild atrophy and chronic ischemic white matter disease. Posterior fossa structures appear within normal limits. There is no hydrocephalus or intraventricular blood.  Mastoid air cells are clear. No midline shift. No mass lesion. The calvarium is intact.  CT CERVICAL SPINE FINDINGS  Cervical spinal alignment is within normal limits. Prevertebral soft tissues are normal. C5-C6 ACDF with solid fusion. Odontoid intact. Occipital condyles appear intact. Vertebral artery atherosclerosis incidentally noted. Lung apices show dependent atelectasis. Dual lead RIGHT subclavian cardiac pacemaker leads partially visualized.  IMPRESSION: 1. Small RIGHT occipital lobe parenchymal hemorrhage, most compatible with hemorrhagic contusion. 2. No acute abnormality in the cervical spine. Solid C5-C6 fusion with ACDF plate.   Electronically Signed   By: Dereck Ligas M.D.   On: 08/12/2014 20:18    Ct Abdomen Pelvis W Contrast  08/12/2014   CLINICAL DATA:  63 year old male with history of trauma from a fall complaining of left-sided abdominal pain.  EXAM: CT ABDOMEN AND PELVIS WITH CONTRAST  TECHNIQUE: Multidetector CT imaging of the abdomen and pelvis was performed using the standard protocol following bolus administration of intravenous contrast.  CONTRAST:  137mL OMNIPAQUE IOHEXOL 300 MG/ML  SOLN  COMPARISON:  CT of the abdomen 11/19/2009.  FINDINGS: Lower chest: 4 mm nodule in the right middle lobe (image 3 of series 205), unchanged compared to the prior examination; this can be considered benign and does not require imaging followup. Small amount of dependent atelectasis in the lower lobes of the lungs bilaterally. Atherosclerotic calcifications in the left anterior descending, left circumflex and right coronary arteries. Pacemaker leads terminating in the right atrial appendage and right ventricular apex.  Hepatobiliary: No focal cystic or solid hepatic lesions. No intra or extrahepatic biliary ductal dilatation. Gallbladder is normal in appearance. No signs of acute traumatic injury to the liver.  Pancreas: Unremarkable.  Spleen: No signs of acute traumatic injury to the spleen. Unremarkable in appearance.  Adrenals/Urinary Tract: Bilateral adrenal glands and bilateral kidneys are normal in appearance. Specifically, no evidence of acute traumatic injury to either kidney. No hydroureteronephrosis or perinephric stranding to suggest urinary tract obstruction at this time. Urinary bladder is normal in appearance.  Stomach/Bowel: Normal appearance of the stomach. No pathologic dilatation of small bowel or colon. Normal appendix.  Vascular/Lymphatic: No signs of acute traumatic injury to the major abdominal or pelvic vasculature. Fusiform ectasia of  the infrarenal abdominal aorta which measures up to 2.8 x 2.5 cm. No pathologically enlarged lymph nodes are noted in the abdomen or pelvis.  Reproductive:  Prostate gland is unremarkable in appearance.  Other: No high attenuation fluid collections within the peritoneal cavity or retroperitoneum to suggest significant posttraumatic hemorrhage. No significant volume of ascites. No pneumoperitoneum.  Musculoskeletal: No acute displaced fractures or aggressive appearing lytic or blastic lesions are noted in the visualized portions of the skeleton.  IMPRESSION: 1. No signs of significant acute traumatic injury to the abdomen or pelvis. 2. No acute findings. 3. Atherosclerosis, including multivessel coronary artery disease as well as fusiform ectasia of the infrarenal abdominal aorta which measures up to 2.8 x 2.5 cm. Assessment for potential risk factor modification, dietary therapy or pharmacologic therapy may be warranted, if clinically indicated. 4. Additional incidental findings, as above.   Electronically Signed   By: Vinnie Langton M.D.   On: 08/12/2014 20:20   Dg Chest Portable 1 View  08/12/2014   CLINICAL DATA:  [place] from ladder.  Landed on left side.  EXAM: PORTABLE CHEST - 1 VIEW  COMPARISON:  05/31/2011  FINDINGS: Right cardiac pacemaker. Prominent perihilar structures could represent vascular congestion. Specifically, there are increased densities in the right paratracheal region which are new. Surgical plate in the lower cervical spine. Negative for a pneumothorax. Heart size is normal.  IMPRESSION: Prominent perihilar structures, particularly in the right paratracheal region. Findings probably represent central vascular congestion. Consider follow-up to ensure resolution.   Electronically Signed   By: Markus Daft M.D.   On: 08/12/2014 19:11   Dg Shoulder Left  08/12/2014   CLINICAL DATA:  63 year old male with history of trauma from a fall off a ladder onto a wood deck complaining of left shoulder pain.  EXAM: LEFT SHOULDER - 2+ VIEW  COMPARISON:  No priors.  FINDINGS: There is no evidence of fracture or dislocation. There is no evidence of arthropathy  or other focal bone abnormality. Soft tissues are unremarkable. Orthopedic fixation hardware in the lower cervical spine incidentally noted.  IMPRESSION: Negative.   Electronically Signed   By: Vinnie Langton M.D.   On: 08/12/2014 19:30   Dg Hand Complete Left  08/12/2014   CLINICAL DATA:  Fall from ladder. Fall on wooden deck. LEFT hand pain. Initial encounter.  EXAM: LEFT HAND - COMPLETE 3+ VIEW  COMPARISON:  None.  FINDINGS: There is no evidence of fracture or dislocation. There is no evidence of arthropathy or other focal bone abnormality. Soft tissues are unremarkable. Patient was unable to remove ring from the ring finger.  IMPRESSION: Negative.   Electronically Signed   By: Dereck Ligas M.D.   On: 08/12/2014 19:34    Micro Results      Recent Results (from the past 240 hour(s))  MRSA PCR Screening     Status: None   Collection Time: 08/13/14  1:22 PM  Result Value Ref Range Status   MRSA by PCR NEGATIVE NEGATIVE Final    Comment:        The GeneXpert MRSA Assay (FDA approved for NASAL specimens only), is one component of a comprehensive MRSA colonization surveillance program. It is not intended to diagnose MRSA infection nor to guide or monitor treatment for MRSA infections.        Today   Subjective:    Nicholaus Steinke today has no headache,no chest abdominal pain,no new weakness tingling or numbness, feels much better wants to go home today.  Objective:   Blood pressure 154/77, pulse 66, temperature 97.9 F (36.6 C), temperature source Oral, resp. rate 20, height 6\' 1"  (1.854 m), weight 86.183 kg (190 lb), SpO2 97 %.   Intake/Output Summary (Last 24 hours) at 08/16/14 1206 Last data filed at 08/16/14 1050  Gross per 24 hour  Intake    680 ml  Output   1325 ml  Net   -645 ml    Exam Awake Alert, Oriented x 3, No new F.N deficits, Normal affect [place].AT,PERRAL Supple Neck,No JVD, No cervical lymphadenopathy appriciated.  Symmetrical Chest wall  movement, Good air movement bilaterally, CTAB RRR,No Gallops,Rubs or new Murmurs, No Parasternal Heave +ve B.Sounds, Abd Soft, Non tender, No organomegaly appriciated, No rebound -guarding or rigidity. No Cyanosis, Clubbing or edema, No new Rash or bruise  Data Review   CBC w Diff:  Lab Results  Component Value Date   WBC 5.9 08/15/2014   HGB 14.0 08/15/2014   HCT 41.1 08/15/2014   PLT 109* 08/15/2014   LYMPHOPCT 12 08/13/2014   MONOPCT 9 08/13/2014   EOSPCT 0 08/13/2014   BASOPCT 0 08/13/2014    CMP:  Lab Results  Component Value Date   NA 139 08/15/2014   K 3.6* 08/15/2014   CL 103 08/15/2014   CO2 25 08/15/2014   BUN 17 08/15/2014   CREATININE 1.01 08/15/2014   PROT 6.5 08/14/2014   ALBUMIN 3.4* 08/14/2014   BILITOT 1.2 08/14/2014   ALKPHOS 47 08/14/2014   AST 22 08/14/2014   ALT 24 08/14/2014  .     Total Time in preparing paper work, data evaluation and todays exam - 35 minutes  Thurnell Lose M.D on 08/16/2014 at 12:06 PM  [facility] Office  [phone]

## 2014-08-16 NOTE — Care Management Note (Signed)
08/16/14 14:30 CM met with pt and gave pt outpt map/directions/contact number for [facility].  Pt has prescription for outpt rehab.  CM called DME rep, Jeneen Rinks to deliver rolling walker to room for discharge.  No other CM needs were communicated.  Mariane Masters, BSN, CM [phone].

## 2014-08-16 NOTE — Progress Notes (Signed)
Discharge instructions and prescriptions given to pt. Pt verbally acknowledged understanding. Pt inquired home PT orders. No orders placed. Nurse to follow up with Education officer, museum.  Pt to be transported to home per car by wife. Will monitor and follow up.    Angeline Slim I 08/16/2014 1:14 PM

## 2014-08-16 NOTE — Discharge Instructions (Signed)
Follow with Primary MD Leonides Grills, MD in 7 days   Get CBC, CMP, 2 view Chest X ray checked  by Primary MD next visit.    Activity: As tolerated with Full fall precautions use walker/cane & assistance as needed   Disposition Home     Diet: Heart Healthy    For Heart failure patients - Check your Weight same time everyday, if you gain over 2 pounds, or you develop in leg swelling, experience more shortness of breath or chest pain, call your Primary MD immediately. Follow Cardiac Low Salt Diet and 1.8 lit/day fluid restriction.   On your next visit with your primary care physician please Get Medicines reviewed and adjusted.   Please request your Prim.MD to go over all Hospital Tests and Procedure/Radiological results at the follow up, please get all Hospital records sent to your Prim MD by signing hospital release before you go home.   If you experience worsening of your admission symptoms, develop shortness of breath, life threatening emergency, suicidal or homicidal thoughts you must seek medical attention immediately by calling 911 or calling your MD immediately  if symptoms less severe.  You Must read complete instructions/literature along with all the possible adverse reactions/side effects for all the Medicines you take and that have been prescribed to you. Take any new Medicines after you have completely understood and accpet all the possible adverse reactions/side effects.   Do not drive, operating heavy machinery, perform activities at heights, swimming or participation in water activities or provide baby sitting services if your were admitted for syncope or siezures until you have seen by Primary MD or a Neurologist and advised to do so again.  Do not drive when taking Pain medications.    Do not take more than prescribed Pain, Sleep and Anxiety Medications  Special Instructions: If you have smoked or chewed Tobacco  in the last 2 yrs please stop smoking, stop any  regular Alcohol  and or any Recreational drug use.  Wear Seat belts while driving.   Please note  You were cared for by a hospitalist during your hospital stay. If you have any questions about your discharge medications or the care you received while you were in the hospital after you are discharged, you can call the unit and asked to speak with the hospitalist on call if the hospitalist that took care of you is not available. Once you are discharged, your primary care physician will handle any further medical issues. Please note that NO REFILLS for any discharge medications will be authorized once you are discharged, as it is imperative that you return to your primary care physician (or establish a relationship with a primary care physician if you do not have one) for your aftercare needs so that they can reassess your need for medications and monitor your lab values.

## 2014-09-08 ENCOUNTER — Ambulatory Visit (INDEPENDENT_AMBULATORY_CARE_PROVIDER_SITE_OTHER): Payer: BC Managed Care – PPO | Admitting: Cardiovascular Disease

## 2014-09-08 ENCOUNTER — Encounter: Payer: Self-pay | Admitting: Cardiovascular Disease

## 2014-09-08 VITALS — BP 142/78 | HR 76 | Resp 16 | Ht 73.0 in | Wt 192.8 lb

## 2014-09-08 DIAGNOSIS — I480 Paroxysmal atrial fibrillation: Secondary | ICD-10-CM

## 2014-09-08 DIAGNOSIS — I2510 Atherosclerotic heart disease of native coronary artery without angina pectoris: Secondary | ICD-10-CM

## 2014-09-08 DIAGNOSIS — S06369S Traumatic hemorrhage of cerebrum, unspecified, with loss of consciousness of unspecified duration, sequela: Secondary | ICD-10-CM

## 2014-09-08 DIAGNOSIS — R55 Syncope and collapse: Secondary | ICD-10-CM

## 2014-09-08 DIAGNOSIS — Z7901 Long term (current) use of anticoagulants: Secondary | ICD-10-CM

## 2014-09-08 DIAGNOSIS — Z8673 Personal history of transient ischemic attack (TIA), and cerebral infarction without residual deficits: Secondary | ICD-10-CM

## 2014-09-08 DIAGNOSIS — I10 Essential (primary) hypertension: Secondary | ICD-10-CM

## 2014-09-08 DIAGNOSIS — I7121 Aneurysm of the ascending aorta, without rupture: Secondary | ICD-10-CM | POA: Insufficient documentation

## 2014-09-08 DIAGNOSIS — Z950 Presence of cardiac pacemaker: Secondary | ICD-10-CM

## 2014-09-08 DIAGNOSIS — I714 Abdominal aortic aneurysm, without rupture, unspecified: Secondary | ICD-10-CM

## 2014-09-08 DIAGNOSIS — W19XXXD Unspecified fall, subsequent encounter: Secondary | ICD-10-CM

## 2014-09-08 DIAGNOSIS — I712 Thoracic aortic aneurysm, without rupture: Secondary | ICD-10-CM

## 2014-09-08 LAB — MDC_IDC_ENUM_SESS_TYPE_INCLINIC
Brady Statistic AP VS Percent: 7.72 %
Brady Statistic AS VP Percent: 0.31 %
Brady Statistic AS VS Percent: 91.96 %
Date Time Interrogation Session: 20151130170129
Lead Channel Impedance Value: 448 Ohm
Lead Channel Impedance Value: 472 Ohm
Lead Channel Pacing Threshold Amplitude: 1 V
Lead Channel Pacing Threshold Amplitude: 1 V
Lead Channel Pacing Threshold Pulse Width: 0.6 ms
Lead Channel Pacing Threshold Pulse Width: 0.6 ms
Lead Channel Sensing Intrinsic Amplitude: 6.9082
Lead Channel Setting Pacing Amplitude: 2 V
Lead Channel Setting Pacing Amplitude: 2.5 V
Lead Channel Setting Pacing Pulse Width: 0.6 ms
Lead Channel Setting Sensing Sensitivity: 0.9 mV
MDC IDC MSMT BATTERY VOLTAGE: 3 V
MDC IDC MSMT LEADCHNL RA SENSING INTR AMPL: 5.412 mV
MDC IDC STAT BRADY AP VP PERCENT: 0.01 %
MDC IDC STAT BRADY RA PERCENT PACED: 7.73 %
MDC IDC STAT BRADY RV PERCENT PACED: 0.32 %
Zone Setting Detection Interval: 400 ms
Zone Setting Detection Interval: 430 ms

## 2014-09-08 MED ORDER — SILDENAFIL CITRATE 100 MG PO TABS
100.0000 mg | ORAL_TABLET | Freq: Every day | ORAL | Status: DC | PRN
Start: 2014-09-08 — End: 2014-09-19

## 2014-09-08 NOTE — Progress Notes (Signed)
Patient ID: Alex Maddox, male   DOB: Jan 02, 1951, 63 y.o.   MRN: [id number]     Reason for office visit Followup neurocardiogenic syncope, pacemaker, paroxysmal atrial fibrillation  Alex Maddox fell off his ladder on November 3, when he was trying to clean his gutters and had a traumatic right occipital intracerebral hemorrhage while on warfarin anticoagulation. It is not clear whether he had syncope or slipped off his ladder. He was hospitalized until November 7. He is due to have a repeat scan of his head in about a week's time to decide whether it is safe to resume anticoagulants. He is still in a lot of pain secondary to rib fractures after the fall.  He has not had any new episodes of syncope. He has not been aware of any palpitations. Interrogation of his pacemaker today shows no recent episodes of atrial fibrillation and an overall low burden of arrhythmia. He continues to have moderate problems with erectile dysfunction and specifically inquires about the safety of using phosphodiesterase inhibitors.  Interrogation of his pacemaker today shows normal device function. He has not had any atrial arrhythmia since July. The overall burden of atrial fibrillation is low. Prior to his accident he was very active, working 7 days a week. His pacemaker confirms greater than 6 hours a day of physical activity. After his fall his activity level has abruptly diminished. There is 8% atrial pacing and 0.3% ventricular pacing. Lead parameters are excellent.  During his hospitalization incidental diagnosis of ascending aortic enlargement (4.2 cm, unchanged from previous studies) and fusiform ectasia of the infrarenal abdominal aorta (2.82.5 cm) were made. Also noted was some coronary calcification.  He has a history of neurally mediated syncope with both cardioinhibitory and vasodepressor components and received a dual-chamber Medtronic MRI conditional permanent pacemaker in August 2012. He has a history of  paroxysmal atrial fibrillation and a history of TIA at age 89. He also has systemic hypertension and hyperlipidemia, but no significant structural cardiac illness. Normal nuclear stress test perfusion pattern in 2012, EF 43%; similar EF by echocardiography . He takes a statin for hyperlipidemia as well as valsartan and carvedilol for both the arrhythmia, the HTN and the cardiomyopathy.   No Known Allergies  Current Outpatient Prescriptions  Medication Sig Dispense Refill  . carvedilol (COREG) 25 MG tablet Take 1 tablet (25 mg total) by mouth 2 (two) times daily. 60 tablet 5  . fish oil-omega-3 fatty acids 1000 MG capsule Take 1 g by mouth daily.    Marland Kitchen FLUVIRIN SUSP Inject 1 application into the muscle once.  0  . Multiple Vitamin (MULTIVITAMIN) tablet Take 1 tablet by mouth daily.    . saw palmetto 160 MG capsule Take 160 mg by mouth daily.    . sildenafil (VIAGRA) 100 MG tablet Take 1 tablet (100 mg total) by mouth daily as needed for erectile dysfunction. 14 tablet 11  . simvastatin (ZOCOR) 40 MG tablet TAKE ONE (1) TABLET AT BEDTIME 90 tablet 1  . valsartan (DIOVAN) 320 MG tablet Take 320 mg by mouth daily.     No current facility-administered medications for this visit.    Past Medical History  Diagnosis Date  . Hypertension   . Stroke   . Paroxysmal atrial fibrillation   . Dyslipidemia   . Tachycardia-bradycardia   . Sinus node dysfunction   . Syncope     2D Echocardiogram 09/27/2010 EF between 40 to 45%  . Transient ischemic attack (TIA) 06/07  . Sleep apnea, obstructive  sleep study 10/09/2007  AHI 5.73/hr  REM AHI 11.90/hr   . Abnormal echocardiogram     stress myoview 10/29/2010  . Arthritis     " IN MY BACK "  . 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    LEFT THUMB   . Presence of permanent cardiac pacemaker     Past Surgical History  Procedure Laterality Date  . Pacemaker insertion  05/30/2011    Medtronic Revo   last checked 11/07/2012  . Loop recorder  implant  02/02/2011    Medtronic- CRM   . Insert / replace / remove pacemaker  05/2011  . Electrocardiogram      Family History  Problem Relation Age of Onset  . Heart attack Father 45    deceased from heart attack  . Hypertension Brother   . Hypertension Mother     History   Social History  . Marital Status: Married    Spouse Name: N/A    Number of Children: 0  . Years of Education: N/A   Occupational History  . Not on file.   Social History Main Topics  . Smoking status: Never Smoker   . Smokeless tobacco: Never Used  . Alcohol Use: No  . Drug Use: No  . Sexual Activity: Not on file   Other Topics Concern  . 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    Review of systems: He denies headaches, visual changes or other focal deficits. He has erectile dysfunction.  The patient specifically denies any chest pain at rest or with exertion, dyspnea at rest or with exertion, orthopnea, paroxysmal nocturnal dyspnea, syncope, palpitations, focal neurological deficits, intermittent claudication, lower extremity edema, unexplained weight gain, cough, hemoptysis or wheezing.  The patient also denies abdominal pain, nausea, vomiting, dysphagia, diarrhea, constipation, polyuria, polydipsia, dysuria, hematuria, frequency, urgency, abnormal bleeding or bruising, fever, chills, unexpected weight changes, mood swings, change in skin or hair texture, change in voice quality, auditory or visual problems, allergic reactions or rashes, new musculoskeletal complaints other than usual "aches and pains".   PHYSICAL EXAM BP 142/78 mmHg  Pulse 76  Resp 16  Ht 6\' 1"  (1.854 m)  Wt 192 lb 12.8 oz (87.454 kg)  BMI 25.44 kg/m2  General: Alert, oriented x3, no distress Head: no evidence of trauma, PERRL, EOMI, no exophtalmos or lid lag, no myxedema, no xanthelasma; normal ears, nose and oropharynx Neck: normal jugular venous pulsations and no  hepatojugular reflux; brisk carotid pulses without delay and no carotid bruits Chest: clear to auscultation, no signs of consolidation by percussion or palpation, normal fremitus, symmetrical and full respiratory excursions. Scar of failed attempt at placement of pacemaker via left subclavian approach, old loop recorder site in the left parasternal area and healthy  right subclavian pacemaker site Cardiovascular: normal position and quality of the apical impulse, regular rhythm, normal first and widely split second heart sounds, no murmurs, rubs or gallops Abdomen: no tenderness or distention, no masses by palpation, no abnormal pulsatility or arterial bruits, normal bowel sounds, no hepatosplenomegaly Extremities: no clubbing, cyanosis or edema; 2+ radial, ulnar and brachial pulses bilaterally; 2+ right femoral, posterior tibial and dorsalis pedis pulses; 2+ left femoral, posterior tibial and dorsalis pedis pulses; no subclavian or femoral bruits Neurological: grossly nonfocal   EKG: Sinus rhythm, left atrial abnormality, right bundle branch block, no change  Lipid Panel  Not checked since 2013  BMET    Component Value  Date/Time   NA 139 08/15/2014 0535   K 3.6* 08/15/2014 0535   CL 103 08/15/2014 0535   CO2 25 08/15/2014 0535   GLUCOSE 118* 08/15/2014 0535   BUN 17 08/15/2014 0535   CREATININE 1.01 08/15/2014 0535   CALCIUM 9.2 08/15/2014 0535   GFRNONAA 78* 08/15/2014 0535   GFRAA >90 08/15/2014 0535     ASSESSMENT AND PLAN  Thankfully, Alex Maddox has not had any episodes of atrial fibrillation the last several months. His risk of stroke should therefore be low even after discontinuation of his warfarin. Hopefully he will have positive results on his upcoming scan and we can resume anticoagulation.  His blood pressure is borderline controlled on maximum dose of valsartan and carvedilol. He does not have any clinical manifestations of congestive heart failure despite mildly depressed  left ventricular systolic function. He is on a statin for hyperlipidemia and has known coronary calcifications without angina pectoris and with normal previous myocardial perfusion study. It is time to recheck his lipids.  He has normal function of his dual-chamber permanent pacemaker that has been only partly successful at preventing episodes of neurally mediated syncope (he still has events related to vasodepressive mechanism). Will perform a remote download of his pacemaker in 3 months and see him in the office in 6 months.   Orders Placed This Encounter  Procedures  . Implantable device check   Meds ordered this encounter  Medications  . sildenafil (VIAGRA) 100 MG tablet    Sig: Take 1 tablet (100 mg total) by mouth daily as needed for erectile dysfunction.    Dispense:  14 tablet    Refill:  [address], [place], [facility] [phone] office [phone] pager

## 2014-09-08 NOTE — Patient Instructions (Addendum)
Remote monitoring is used to monitor your pacemaker from home. This monitoring reduces the number of office visits required to check your device to one time per year. It allows [name] to keep an eye on the functioning of your device to ensure it is working properly. You are scheduled for a device check from home on 12-10-2014. You may send your transmission at any time that day. If you have a wireless device, the transmission will be sent automatically. After your physician reviews your transmission, you will receive a postcard with your next transmission date.  Your physician recommends that you schedule a follow-up appointment in:6 months with Dr.Croitoru

## 2014-09-15 ENCOUNTER — Other Ambulatory Visit: Payer: Self-pay | Admitting: Neurosurgery

## 2014-09-15 DIAGNOSIS — S06301D Unspecified focal traumatic brain injury with loss of consciousness of 30 minutes or less, subsequent encounter: Secondary | ICD-10-CM

## 2014-09-17 ENCOUNTER — Ambulatory Visit (INDEPENDENT_AMBULATORY_CARE_PROVIDER_SITE_OTHER): Payer: BC Managed Care – PPO | Admitting: *Deleted

## 2014-09-17 ENCOUNTER — Ambulatory Visit
Admission: RE | Admit: 2014-09-17 | Discharge: 2014-09-17 | Disposition: A | Discharge status: Home / Self Care | Payer: BC Managed Care – PPO | Source: Ambulatory Visit | Attending: Neurosurgery | Admitting: Neurosurgery

## 2014-09-17 DIAGNOSIS — Z7901 Long term (current) use of anticoagulants: Secondary | ICD-10-CM

## 2014-09-17 DIAGNOSIS — S06301D Unspecified focal traumatic brain injury with loss of consciousness of 30 minutes or less, subsequent encounter: Secondary | ICD-10-CM

## 2014-09-17 DIAGNOSIS — I4891 Unspecified atrial fibrillation: Secondary | ICD-10-CM

## 2014-09-17 DIAGNOSIS — Z5181 Encounter for therapeutic drug level monitoring: Secondary | ICD-10-CM

## 2014-09-17 LAB — POCT INR: INR: 1.1

## 2014-09-18 ENCOUNTER — Telehealth: Payer: Self-pay | Admitting: Cardiovascular Disease

## 2014-09-18 NOTE — Telephone Encounter (Signed)
Yes OK to give him the equivalent doe of Viagra in the 20 mg form

## 2014-09-18 NOTE — Telephone Encounter (Signed)
RN spoke to pharmacist Informed Dr Sallyanne Kuster not in office, will contact answer is received Jonni Sanger verbalized understanding.

## 2014-09-18 NOTE — Telephone Encounter (Signed)
Alex Maddox called about the pt's Viagra 100mg  prescription. He states that Alex Maddox insurance will not cover that and if Dr. Loletha Grayer could write a new prescription for Sildenafil 20mg . Please call  Thanks

## 2014-09-19 ENCOUNTER — Encounter: Payer: Self-pay | Admitting: Cardiovascular Disease

## 2014-09-19 MED ORDER — SILDENAFIL CITRATE 20 MG PO TABS: 20.0000 mg | ORAL_TABLET | ORAL | Status: DC | PRN

## 2014-09-19 NOTE — Telephone Encounter (Signed)
Sildenafil can be filled with a 20mg  tablet for $2 a pill.  Requesting to change patient's Rx.  Dr. Loletha Grayer authorized and new Rx called in.

## 2014-09-19 NOTE — Telephone Encounter (Signed)
New Rx sent to pharmacy 20mg  tabs #10 w/3 refills.

## 2014-10-01 ENCOUNTER — Ambulatory Visit (INDEPENDENT_AMBULATORY_CARE_PROVIDER_SITE_OTHER): Payer: BC Managed Care – PPO | Admitting: *Deleted

## 2014-10-01 DIAGNOSIS — Z5181 Encounter for therapeutic drug level monitoring: Secondary | ICD-10-CM

## 2014-10-01 DIAGNOSIS — Z7901 Long term (current) use of anticoagulants: Secondary | ICD-10-CM

## 2014-10-01 DIAGNOSIS — I4891 Unspecified atrial fibrillation: Secondary | ICD-10-CM

## 2014-10-01 LAB — POCT INR: INR: 2.9

## 2014-10-15 ENCOUNTER — Ambulatory Visit (INDEPENDENT_AMBULATORY_CARE_PROVIDER_SITE_OTHER): Payer: BLUE CROSS/BLUE SHIELD | Admitting: *Deleted

## 2014-10-15 DIAGNOSIS — I4891 Unspecified atrial fibrillation: Secondary | ICD-10-CM

## 2014-10-15 DIAGNOSIS — Z7901 Long term (current) use of anticoagulants: Secondary | ICD-10-CM

## 2014-10-15 DIAGNOSIS — Z5181 Encounter for therapeutic drug level monitoring: Secondary | ICD-10-CM

## 2014-10-15 LAB — POCT INR: INR: 2.7

## 2014-10-21 ENCOUNTER — Other Ambulatory Visit: Payer: Self-pay

## 2014-10-21 MED ORDER — CARVEDILOL 25 MG PO TABS: 25.0000 mg | ORAL_TABLET | Freq: Two times a day (BID) | ORAL | Status: DC

## 2014-10-21 NOTE — Telephone Encounter (Signed)
Rx sent to pharmacy   

## 2014-10-23 ENCOUNTER — Other Ambulatory Visit: Payer: Self-pay | Admitting: Cardiovascular Disease

## 2014-10-23 NOTE — Telephone Encounter (Signed)
Rx(s) sent to pharmacy electronically.  

## 2014-11-03 ENCOUNTER — Telehealth: Payer: Self-pay | Admitting: Cardiovascular Disease

## 2014-11-03 NOTE — Telephone Encounter (Signed)
Per Answering Service-Question about his pacemaker,

## 2014-11-04 NOTE — Telephone Encounter (Signed)
LMTCB//sss 

## 2014-11-05 NOTE — Telephone Encounter (Signed)
[place] ordered.

## 2014-11-05 NOTE — Telephone Encounter (Signed)
Follow.up ° ° ° ° ° °Returning Alex Maddox's call °

## 2014-11-12 ENCOUNTER — Ambulatory Visit (INDEPENDENT_AMBULATORY_CARE_PROVIDER_SITE_OTHER): Payer: BLUE CROSS/BLUE SHIELD | Admitting: *Deleted

## 2014-11-12 DIAGNOSIS — I4891 Unspecified atrial fibrillation: Secondary | ICD-10-CM

## 2014-11-12 DIAGNOSIS — Z7901 Long term (current) use of anticoagulants: Secondary | ICD-10-CM

## 2014-11-12 DIAGNOSIS — Z5181 Encounter for therapeutic drug level monitoring: Secondary | ICD-10-CM

## 2014-11-12 LAB — POCT INR: INR: 2

## 2014-11-19 ENCOUNTER — Telehealth: Payer: Self-pay | Admitting: Cardiovascular Disease

## 2014-11-19 NOTE — Telephone Encounter (Signed)
Informed pt that he will not receive wirex until Middle of March. Pt verbalized understanding.

## 2014-11-19 NOTE — Telephone Encounter (Signed)
New message     Pt talked to [place] days ago.  He has not received something she was going to send so that he can do his remote check.  Please call

## 2014-12-10 ENCOUNTER — Telehealth: Payer: Self-pay | Admitting: Cardiology

## 2014-12-10 ENCOUNTER — Ambulatory Visit (INDEPENDENT_AMBULATORY_CARE_PROVIDER_SITE_OTHER): Payer: BLUE CROSS/BLUE SHIELD | Admitting: *Deleted

## 2014-12-10 DIAGNOSIS — I480 Paroxysmal atrial fibrillation: Secondary | ICD-10-CM

## 2014-12-10 NOTE — Telephone Encounter (Signed)
LMOVM reminding pt to send remote transmission.   

## 2014-12-11 NOTE — Progress Notes (Signed)
Remote pacemaker transmission.   

## 2014-12-16 LAB — MDC_IDC_ENUM_SESS_TYPE_REMOTE
Battery Voltage: 3 V
Brady Statistic AP VP Percent: 0.32 %
Brady Statistic AP VS Percent: 26.12 %
Brady Statistic AS VS Percent: 73.24 %
Lead Channel Impedance Value: 432 Ohm
Lead Channel Impedance Value: 440 Ohm
Lead Channel Sensing Intrinsic Amplitude: 14.8032
Lead Channel Sensing Intrinsic Amplitude: 5.72 mV
Lead Channel Setting Pacing Amplitude: 2 V
Lead Channel Setting Pacing Amplitude: 2.5 V
Lead Channel Setting Pacing Pulse Width: 0.6 ms
MDC IDC SESS DTM: 20160303030329
MDC IDC SET LEADCHNL RV SENSING SENSITIVITY: 0.9 mV
MDC IDC SET ZONE DETECTION INTERVAL: 430 ms
MDC IDC STAT BRADY AS VP PERCENT: 0.33 %
MDC IDC STAT BRADY RA PERCENT PACED: 26.44 %
MDC IDC STAT BRADY RV PERCENT PACED: 0.65 %
Zone Setting Detection Interval: 400 ms

## 2014-12-18 ENCOUNTER — Encounter: Payer: Self-pay | Admitting: *Deleted

## 2014-12-19 ENCOUNTER — Encounter: Payer: Self-pay | Admitting: Cardiovascular Disease

## 2014-12-24 ENCOUNTER — Ambulatory Visit (INDEPENDENT_AMBULATORY_CARE_PROVIDER_SITE_OTHER): Payer: BLUE CROSS/BLUE SHIELD | Admitting: *Deleted

## 2014-12-24 DIAGNOSIS — I4891 Unspecified atrial fibrillation: Secondary | ICD-10-CM

## 2014-12-24 DIAGNOSIS — Z5181 Encounter for therapeutic drug level monitoring: Secondary | ICD-10-CM

## 2014-12-24 DIAGNOSIS — I480 Paroxysmal atrial fibrillation: Secondary | ICD-10-CM

## 2014-12-24 DIAGNOSIS — Z7901 Long term (current) use of anticoagulants: Secondary | ICD-10-CM

## 2014-12-24 LAB — POCT INR: INR: 3

## 2015-01-06 ENCOUNTER — Other Ambulatory Visit: Payer: Self-pay | Admitting: Cardiovascular Disease

## 2015-02-04 ENCOUNTER — Ambulatory Visit (INDEPENDENT_AMBULATORY_CARE_PROVIDER_SITE_OTHER): Payer: BLUE CROSS/BLUE SHIELD | Admitting: *Deleted

## 2015-02-04 DIAGNOSIS — Z7901 Long term (current) use of anticoagulants: Secondary | ICD-10-CM | POA: Diagnosis not present

## 2015-02-04 DIAGNOSIS — Z5181 Encounter for therapeutic drug level monitoring: Secondary | ICD-10-CM | POA: Diagnosis not present

## 2015-02-04 DIAGNOSIS — I4891 Unspecified atrial fibrillation: Secondary | ICD-10-CM | POA: Diagnosis not present

## 2015-02-04 LAB — POCT INR: INR: 2.4

## 2015-02-19 ENCOUNTER — Other Ambulatory Visit: Payer: Self-pay | Admitting: Cardiovascular Disease

## 2015-03-18 ENCOUNTER — Ambulatory Visit (INDEPENDENT_AMBULATORY_CARE_PROVIDER_SITE_OTHER): Payer: BLUE CROSS/BLUE SHIELD | Admitting: *Deleted

## 2015-03-18 DIAGNOSIS — I4891 Unspecified atrial fibrillation: Secondary | ICD-10-CM | POA: Diagnosis not present

## 2015-03-18 DIAGNOSIS — Z7901 Long term (current) use of anticoagulants: Secondary | ICD-10-CM

## 2015-03-18 DIAGNOSIS — Z5181 Encounter for therapeutic drug level monitoring: Secondary | ICD-10-CM

## 2015-03-18 LAB — POCT INR: INR: 2.1

## 2015-04-19 ENCOUNTER — Other Ambulatory Visit: Payer: Self-pay | Admitting: Cardiovascular Disease

## 2015-04-20 NOTE — Telephone Encounter (Signed)
REFILL 

## 2015-04-29 ENCOUNTER — Ambulatory Visit (INDEPENDENT_AMBULATORY_CARE_PROVIDER_SITE_OTHER): Payer: BLUE CROSS/BLUE SHIELD | Admitting: *Deleted

## 2015-04-29 DIAGNOSIS — Z7901 Long term (current) use of anticoagulants: Secondary | ICD-10-CM | POA: Diagnosis not present

## 2015-04-29 DIAGNOSIS — I4891 Unspecified atrial fibrillation: Secondary | ICD-10-CM | POA: Diagnosis not present

## 2015-04-29 DIAGNOSIS — Z5181 Encounter for therapeutic drug level monitoring: Secondary | ICD-10-CM | POA: Diagnosis not present

## 2015-04-29 LAB — POCT INR: INR: 2.4

## 2015-05-19 ENCOUNTER — Other Ambulatory Visit: Payer: Self-pay | Admitting: Cardiovascular Disease

## 2015-05-19 NOTE — Telephone Encounter (Signed)
Rx(s) sent to pharmacy electronically. OV 06/02/15

## 2015-06-02 ENCOUNTER — Ambulatory Visit (INDEPENDENT_AMBULATORY_CARE_PROVIDER_SITE_OTHER): Payer: BLUE CROSS/BLUE SHIELD | Admitting: *Deleted

## 2015-06-02 ENCOUNTER — Encounter: Payer: Self-pay | Admitting: Cardiovascular Disease

## 2015-06-02 DIAGNOSIS — R55 Syncope and collapse: Secondary | ICD-10-CM

## 2015-06-02 DIAGNOSIS — I480 Paroxysmal atrial fibrillation: Secondary | ICD-10-CM | POA: Diagnosis not present

## 2015-06-02 NOTE — Patient Instructions (Signed)
Remote monitoring is used to monitor your pacemaker from home. This monitoring reduces the number of office visits required to check your device to one time per year. It allows [name] to keep an eye on the functioning of your device to ensure it is working properly. You are scheduled for a device check from home on 09-03-2015. You may send your transmission at any time that day. If you have a wireless device, the transmission will be sent automatically. After your physician reviews your transmission, you will receive a postcard with your next transmission date.  Your physician recommends that you schedule a follow-up appointment in: 6 months with Dr.Croitoru  Your physician recommends that you return for lab work during physical: CMP/Lipid

## 2015-06-05 LAB — CUP PACEART INCLINIC DEVICE CHECK
Battery Voltage: 2.99 V
Brady Statistic AP VS Percent: 37.35 %
Brady Statistic AS VS Percent: 62.34 %
Lead Channel Impedance Value: 424 Ohm
Lead Channel Impedance Value: 464 Ohm
Lead Channel Pacing Threshold Amplitude: 1 V
Lead Channel Pacing Threshold Pulse Width: 0.6 ms
Lead Channel Sensing Intrinsic Amplitude: 13.8163
Lead Channel Setting Pacing Amplitude: 2.5 V
Lead Channel Setting Pacing Pulse Width: 0.6 ms
MDC IDC MSMT LEADCHNL RA PACING THRESHOLD AMPLITUDE: 1 V
MDC IDC MSMT LEADCHNL RA PACING THRESHOLD PULSEWIDTH: 0.6 ms
MDC IDC MSMT LEADCHNL RA SENSING INTR AMPL: 5.192 mV
MDC IDC SESS DTM: 20160823195213
MDC IDC SET LEADCHNL RA PACING AMPLITUDE: 2 V
MDC IDC SET LEADCHNL RV SENSING SENSITIVITY: 0.9 mV
MDC IDC STAT BRADY AP VP PERCENT: 0.19 %
MDC IDC STAT BRADY AS VP PERCENT: 0.13 %
MDC IDC STAT BRADY RA PERCENT PACED: 37.53 %
MDC IDC STAT BRADY RV PERCENT PACED: 0.32 %
Zone Setting Detection Interval: 400 ms
Zone Setting Detection Interval: 430 ms

## 2015-06-05 NOTE — Progress Notes (Signed)
Pacemaker check in clinic. Normal device function. Thresholds, sensing, impedances consistent with previous measurements. Device programmed to maximize longevity. (10) AT/AF episodes (0.6%)--max dur. 12 hrs, Max Avg A 429, Max Avg V 130 + Warfarin. (3) high ventricular rates noted---max dur. >=17 bts per EGM---NSVT. (36) Fast A&V episodes---max dur. 1hr 7 mins, Max A 462, Max Avg V 171---1:1 SVT, AF/RVR. Device programmed at appropriate safety margins. Histogram distribution appropriate for patient activity level. Device programmed to optimize intrinsic conduction. Batt voltage 2.99V (ERI 2.81V). Patient will follow up via [facility] on 11/24 and with [place] in 6 months.

## 2015-06-08 ENCOUNTER — Ambulatory Visit (INDEPENDENT_AMBULATORY_CARE_PROVIDER_SITE_OTHER): Payer: BLUE CROSS/BLUE SHIELD | Admitting: Pharmacist

## 2015-06-08 DIAGNOSIS — I480 Paroxysmal atrial fibrillation: Secondary | ICD-10-CM

## 2015-06-08 DIAGNOSIS — Z7901 Long term (current) use of anticoagulants: Secondary | ICD-10-CM

## 2015-06-08 DIAGNOSIS — Z5181 Encounter for therapeutic drug level monitoring: Secondary | ICD-10-CM | POA: Diagnosis not present

## 2015-06-08 DIAGNOSIS — I4891 Unspecified atrial fibrillation: Secondary | ICD-10-CM

## 2015-06-08 LAB — POCT INR: INR: 3.1

## 2015-06-10 ENCOUNTER — Encounter: Payer: Self-pay | Admitting: Cardiovascular Disease

## 2015-06-11 ENCOUNTER — Encounter: Payer: Self-pay | Admitting: *Deleted

## 2015-06-16 ENCOUNTER — Other Ambulatory Visit: Payer: Self-pay | Admitting: Cardiovascular Disease

## 2015-06-19 ENCOUNTER — Other Ambulatory Visit: Payer: Self-pay | Admitting: Cardiovascular Disease

## 2015-06-19 NOTE — Telephone Encounter (Signed)
Rx(s) sent to pharmacy electronically.  

## 2015-06-25 ENCOUNTER — Encounter: Payer: Self-pay | Admitting: Podiatry

## 2015-06-25 ENCOUNTER — Ambulatory Visit (INDEPENDENT_AMBULATORY_CARE_PROVIDER_SITE_OTHER): Payer: BLUE CROSS/BLUE SHIELD | Admitting: Podiatry

## 2015-06-25 ENCOUNTER — Ambulatory Visit (INDEPENDENT_AMBULATORY_CARE_PROVIDER_SITE_OTHER): Payer: BLUE CROSS/BLUE SHIELD

## 2015-06-25 VITALS — BP 152/92 | HR 61 | Resp 16

## 2015-06-25 DIAGNOSIS — M722 Plantar fascial fibromatosis: Secondary | ICD-10-CM | POA: Diagnosis not present

## 2015-06-25 MED ORDER — METHYLPREDNISOLONE 4 MG PO TBPK: ORAL_TABLET | ORAL | Status: DC

## 2015-06-25 NOTE — Patient Instructions (Signed)

## 2015-06-25 NOTE — Progress Notes (Signed)
   Subjective:    Patient ID: Alex Maddox, male    DOB: 08-26-1951, 64 y.o.   MRN: [id number]  HPI: He presents today as a 64 year old white male with his wife concerned of pain to his left heel. He states that his been present for approximately year with pain being the worse in the morning. He has been treated by Dr. Blanch Media who has provided him with 5 injections the latest of which worries yesterday. He states that the 2 of these individuals don't get along very well and that there is nothing else that he can do other than to wear this Cam Walker. He is seeking a second opinion and would like to consider EPAT therapy.    Review of Systems  All other systems reviewed and are negative.      Objective:   Physical Exam: He presents today 64 years old vital signs stable alert and oriented 3 in no acute distress. Pulses are strongly palpable bilateral. Neurologic sensorium is intact per Semmes-Weinstein monofilament. Deep tendon reflexes are intact bilateral and muscle strength +5 over 5 dorsiflexion plantar flexors and inverters everters all intrinsic musculature is intact. Orthopedic evaluation demonstrates all joints distal to the ankle for range of motion without crepitation. Cutaneous evaluation of straight supple well-hydrated cutis no erythema edema cellulitis drainage or odor. Pain on palpation to the plantar medial calcaneal tubercle of the left heel is present. He has no pain on medial and lateral compression of the calcaneus. 3 views radiographs taken today demonstrate a very osseously normal foot short of a soft tissue increase in density at the plantar fascial calcaneal insertion site. Cutaneous evaluation demonstrates no sign of infection no open lesions or wounds.        Assessment & Plan:  Chronic intractable plantar fasciitis left heel.  Plan: Discussed etiology pathology conservative versus surgical therapies. At this point since he just had a cortisone injection yesterday I  think the timing is right to run a Medrol Dosepak. I also encouraged him to continue the use of the Cam Walker and the night splint which he has at home at bedtime. I also dispensed a plantar fascial brace for the left foot to where when he is wearing his tennis shoes. I explained to him that shockwave therapy is contraindicated in an individual with Coumadin or a pacemaker. He is disappointed but he understands that and is amenable to it. I did explain to him that we still have formal physical therapy as well as orthotics to try. I explained him that if worse comes to worse we can consider surgical intervention since it is a relatively minor surgery and he would have to be off of his medication for a very short period of time. I will follow-up with him in 1 month.

## 2015-07-13 ENCOUNTER — Ambulatory Visit (INDEPENDENT_AMBULATORY_CARE_PROVIDER_SITE_OTHER): Payer: BLUE CROSS/BLUE SHIELD | Admitting: *Deleted

## 2015-07-13 DIAGNOSIS — Z7901 Long term (current) use of anticoagulants: Secondary | ICD-10-CM | POA: Diagnosis not present

## 2015-07-13 DIAGNOSIS — Z5181 Encounter for therapeutic drug level monitoring: Secondary | ICD-10-CM | POA: Diagnosis not present

## 2015-07-13 DIAGNOSIS — I4891 Unspecified atrial fibrillation: Secondary | ICD-10-CM | POA: Diagnosis not present

## 2015-07-13 LAB — POCT INR: INR: 3.1

## 2015-07-23 ENCOUNTER — Ambulatory Visit (INDEPENDENT_AMBULATORY_CARE_PROVIDER_SITE_OTHER): Payer: BLUE CROSS/BLUE SHIELD | Admitting: Podiatry

## 2015-07-23 ENCOUNTER — Encounter: Payer: Self-pay | Admitting: Podiatry

## 2015-07-23 DIAGNOSIS — M722 Plantar fascial fibromatosis: Secondary | ICD-10-CM | POA: Diagnosis not present

## 2015-07-26 NOTE — Progress Notes (Signed)
He presents today for follow-up of pain to his left heel. He states that it is some better than it was previously however he still feels pain every step he takes.  Objective: Vital signs are stable he is alert and oriented 3. Pulses are strongly palpable. Pain on palpation medial calcaneal tubercle of the left heel still very tender present. No erythema edema cellulitis drainage or odor no open wounds or lesions. No pain on medial and lateral compression of the calcaneus.  Assessment: Chronic intractable plantar fasciitis for many years. Left foot.  Plan: Reinjected his left heel again today stronger steroid and he was scanned for a set of orthotics. We did discuss the need for surgical intervention.  Roselind Messier DPM

## 2015-08-14 ENCOUNTER — Ambulatory Visit: Payer: BLUE CROSS/BLUE SHIELD | Admitting: *Deleted

## 2015-08-14 DIAGNOSIS — M722 Plantar fascial fibromatosis: Secondary | ICD-10-CM

## 2015-08-14 NOTE — Progress Notes (Signed)
Patient ID: Alex Maddox, male   DOB: 21-Dec-1950, 64 y.o.   MRN: [id number] Patient presents for orthotic pick up.  Verbal and written break in and wear instructions given.  Patient will follow up in 4 weeks if symptoms worsen or fail to improve.

## 2015-08-14 NOTE — Patient Instructions (Signed)

## 2015-08-17 ENCOUNTER — Ambulatory Visit (INDEPENDENT_AMBULATORY_CARE_PROVIDER_SITE_OTHER): Payer: BLUE CROSS/BLUE SHIELD | Admitting: *Deleted

## 2015-08-17 DIAGNOSIS — Z7901 Long term (current) use of anticoagulants: Secondary | ICD-10-CM

## 2015-08-17 DIAGNOSIS — I4891 Unspecified atrial fibrillation: Secondary | ICD-10-CM

## 2015-08-17 DIAGNOSIS — Z5181 Encounter for therapeutic drug level monitoring: Secondary | ICD-10-CM | POA: Diagnosis not present

## 2015-08-17 LAB — POCT INR: INR: 1.8

## 2015-09-07 ENCOUNTER — Ambulatory Visit (INDEPENDENT_AMBULATORY_CARE_PROVIDER_SITE_OTHER): Payer: BLUE CROSS/BLUE SHIELD | Admitting: *Deleted

## 2015-09-07 ENCOUNTER — Telehealth: Payer: Self-pay | Admitting: Cardiology

## 2015-09-07 DIAGNOSIS — Z5181 Encounter for therapeutic drug level monitoring: Secondary | ICD-10-CM | POA: Diagnosis not present

## 2015-09-07 DIAGNOSIS — Z7901 Long term (current) use of anticoagulants: Secondary | ICD-10-CM

## 2015-09-07 DIAGNOSIS — I480 Paroxysmal atrial fibrillation: Secondary | ICD-10-CM | POA: Diagnosis not present

## 2015-09-07 DIAGNOSIS — I4891 Unspecified atrial fibrillation: Secondary | ICD-10-CM

## 2015-09-07 LAB — POCT INR: INR: 2

## 2015-09-07 NOTE — Telephone Encounter (Signed)
LMOVM reminding pt to send remote transmission.   

## 2015-09-08 ENCOUNTER — Encounter: Payer: Self-pay | Admitting: Podiatry

## 2015-09-08 ENCOUNTER — Ambulatory Visit (INDEPENDENT_AMBULATORY_CARE_PROVIDER_SITE_OTHER): Payer: BLUE CROSS/BLUE SHIELD | Admitting: Podiatry

## 2015-09-08 VITALS — BP 167/107 | HR 62 | Resp 16

## 2015-09-08 DIAGNOSIS — M722 Plantar fascial fibromatosis: Secondary | ICD-10-CM | POA: Diagnosis not present

## 2015-09-08 NOTE — Progress Notes (Signed)
He presents today for follow-up of his orthotics and plantar fasciitis of his left foot. He states that he is doing much better than he was initially however he can only wear his orthotics in certain shoes. He states that he's proximally 50% better and is very excited about that.  Objective: Vital signs are stable he is alert and oriented 3. Pulses are strongly palpable. Mild reproducible pain to the left heel.  Assessment: Well-healed plantar fasciitis left.  Plan: Recommended that he wear orthotics in all of his shoes and he purchased another pair of orthotics today.  Roselind Messier DPM

## 2015-09-09 NOTE — Progress Notes (Signed)
Remote pacemaker transmission.   

## 2015-09-15 ENCOUNTER — Ambulatory Visit: Payer: BLUE CROSS/BLUE SHIELD | Admitting: Podiatry

## 2015-09-17 LAB — CUP PACEART REMOTE DEVICE CHECK
Battery Voltage: 2.99 V
Brady Statistic AS VS Percent: 65.31 %
Implantable Lead Implant Date: 20120820
Implantable Lead Location: 753860
Implantable Lead Model: 5086
Lead Channel Impedance Value: 440 Ohm
Lead Channel Sensing Intrinsic Amplitude: 13.158 mV
Lead Channel Sensing Intrinsic Amplitude: 4.796 mV
Lead Channel Setting Sensing Sensitivity: 0.9 mV
MDC IDC LEAD IMPLANT DT: 20120820
MDC IDC LEAD LOCATION: 753859
MDC IDC LEAD MODEL: 5086
MDC IDC MSMT LEADCHNL RA IMPEDANCE VALUE: 400 Ohm
MDC IDC SESS DTM: 20161129032746
MDC IDC SET LEADCHNL RA PACING AMPLITUDE: 2 V
MDC IDC SET LEADCHNL RV PACING AMPLITUDE: 2.5 V
MDC IDC SET LEADCHNL RV PACING PULSEWIDTH: 0.6 ms
MDC IDC STAT BRADY AP VP PERCENT: 0.11 %
MDC IDC STAT BRADY AP VS PERCENT: 34.55 %
MDC IDC STAT BRADY AS VP PERCENT: 0.03 %
MDC IDC STAT BRADY RA PERCENT PACED: 34.66 %
MDC IDC STAT BRADY RV PERCENT PACED: 0.14 %

## 2015-09-18 ENCOUNTER — Encounter: Payer: Self-pay | Admitting: Cardiology

## 2015-09-20 ENCOUNTER — Other Ambulatory Visit: Payer: Self-pay | Admitting: Cardiovascular Disease

## 2015-09-21 NOTE — Telephone Encounter (Signed)
Rx request sent to pharmacy.  

## 2015-09-23 ENCOUNTER — Telehealth: Payer: Self-pay | Admitting: *Deleted

## 2015-09-23 MED ORDER — SIMVASTATIN 40 MG PO TABS: ORAL_TABLET | ORAL | Status: DC

## 2015-09-23 MED ORDER — VALSARTAN 320 MG PO TABS: 320.0000 mg | ORAL_TABLET | Freq: Every day | ORAL | Status: DC

## 2015-09-23 NOTE — Telephone Encounter (Signed)
Called and confirmed ----he is taking carvedilol 25 mg twice daily.  Refills sent in for his cardiac meds to [facility].

## 2015-09-23 NOTE — Progress Notes (Signed)
[facility] 09/23/15 8:38am

## 2015-09-23 NOTE — Telephone Encounter (Signed)
-----   Message from Sanda Klein, MD sent at 09/21/2015  5:25 PM EST ----- Remote reviewed.   Not pacemaker dependent. Battery status is good. Lead measurements are stable. Heart rate histogram is favorable. Low burden atrial fibrillation with RVR noted, on warfarin.   Pamala Hurry, can you please confirm he is still taking carvedilol as prescribed 25 mg BID?

## 2015-09-30 ENCOUNTER — Ambulatory Visit (INDEPENDENT_AMBULATORY_CARE_PROVIDER_SITE_OTHER): Payer: BLUE CROSS/BLUE SHIELD | Admitting: Pharmacist

## 2015-09-30 DIAGNOSIS — Z7901 Long term (current) use of anticoagulants: Secondary | ICD-10-CM

## 2015-09-30 DIAGNOSIS — I4891 Unspecified atrial fibrillation: Secondary | ICD-10-CM

## 2015-09-30 DIAGNOSIS — Z5181 Encounter for therapeutic drug level monitoring: Secondary | ICD-10-CM | POA: Diagnosis not present

## 2015-09-30 DIAGNOSIS — I480 Paroxysmal atrial fibrillation: Secondary | ICD-10-CM

## 2015-09-30 LAB — POCT INR: INR: 2.6

## 2015-10-07 NOTE — Progress Notes (Unsigned)
B/P: 168/104, Pulse: 66 taken by: Mayra Reel, RN

## 2015-10-28 ENCOUNTER — Ambulatory Visit (INDEPENDENT_AMBULATORY_CARE_PROVIDER_SITE_OTHER): Payer: BLUE CROSS/BLUE SHIELD | Admitting: Pharmacist

## 2015-10-28 DIAGNOSIS — I4891 Unspecified atrial fibrillation: Secondary | ICD-10-CM

## 2015-10-28 DIAGNOSIS — Z5181 Encounter for therapeutic drug level monitoring: Secondary | ICD-10-CM | POA: Diagnosis not present

## 2015-10-28 DIAGNOSIS — I480 Paroxysmal atrial fibrillation: Secondary | ICD-10-CM

## 2015-10-28 DIAGNOSIS — Z7901 Long term (current) use of anticoagulants: Secondary | ICD-10-CM | POA: Diagnosis not present

## 2015-10-28 LAB — POCT INR: INR: 1.7

## 2015-11-17 ENCOUNTER — Other Ambulatory Visit: Payer: Self-pay | Admitting: Cardiovascular Disease

## 2015-11-17 NOTE — Telephone Encounter (Signed)
Rx request sent to pharmacy.  

## 2015-11-18 ENCOUNTER — Ambulatory Visit (INDEPENDENT_AMBULATORY_CARE_PROVIDER_SITE_OTHER): Payer: BLUE CROSS/BLUE SHIELD | Admitting: *Deleted

## 2015-11-18 DIAGNOSIS — Z7901 Long term (current) use of anticoagulants: Secondary | ICD-10-CM

## 2015-11-18 DIAGNOSIS — I4891 Unspecified atrial fibrillation: Secondary | ICD-10-CM | POA: Diagnosis not present

## 2015-11-18 DIAGNOSIS — Z5181 Encounter for therapeutic drug level monitoring: Secondary | ICD-10-CM

## 2015-11-18 LAB — POCT INR: INR: 1.9

## 2015-12-09 ENCOUNTER — Ambulatory Visit (INDEPENDENT_AMBULATORY_CARE_PROVIDER_SITE_OTHER): Payer: BLUE CROSS/BLUE SHIELD | Admitting: *Deleted

## 2015-12-09 DIAGNOSIS — Z7901 Long term (current) use of anticoagulants: Secondary | ICD-10-CM | POA: Diagnosis not present

## 2015-12-09 DIAGNOSIS — I4891 Unspecified atrial fibrillation: Secondary | ICD-10-CM

## 2015-12-09 DIAGNOSIS — Z5181 Encounter for therapeutic drug level monitoring: Secondary | ICD-10-CM | POA: Diagnosis not present

## 2015-12-09 LAB — POCT INR: INR: 2.8

## 2015-12-28 ENCOUNTER — Encounter: Payer: Self-pay | Admitting: Cardiovascular Disease

## 2015-12-28 ENCOUNTER — Ambulatory Visit (INDEPENDENT_AMBULATORY_CARE_PROVIDER_SITE_OTHER): Payer: BLUE CROSS/BLUE SHIELD | Admitting: Cardiovascular Disease

## 2015-12-28 VITALS — BP 151/96 | HR 67 | Ht 72.0 in | Wt 195.2 lb

## 2015-12-28 DIAGNOSIS — I519 Heart disease, unspecified: Secondary | ICD-10-CM

## 2015-12-28 DIAGNOSIS — R55 Syncope and collapse: Secondary | ICD-10-CM

## 2015-12-28 DIAGNOSIS — I480 Paroxysmal atrial fibrillation: Secondary | ICD-10-CM

## 2015-12-28 DIAGNOSIS — I10 Essential (primary) hypertension: Secondary | ICD-10-CM

## 2015-12-28 DIAGNOSIS — I2510 Atherosclerotic heart disease of native coronary artery without angina pectoris: Secondary | ICD-10-CM

## 2015-12-28 DIAGNOSIS — Z950 Presence of cardiac pacemaker: Secondary | ICD-10-CM | POA: Diagnosis not present

## 2015-12-28 DIAGNOSIS — Z7901 Long term (current) use of anticoagulants: Secondary | ICD-10-CM

## 2015-12-28 DIAGNOSIS — I712 Thoracic aortic aneurysm, without rupture: Secondary | ICD-10-CM

## 2015-12-28 DIAGNOSIS — I7121 Aneurysm of the ascending aorta, without rupture: Secondary | ICD-10-CM

## 2015-12-28 NOTE — Patient Instructions (Signed)
Dr Sallyanne Kuster recommends that you continue on your current medications as directed. Please refer to the Current Medication list given to you today.  Remote monitoring is used to monitor your Pacemaker or ICD from home. This monitoring reduces the number of office visits required to check your device to one time per year. It allows [name] to keep an eye on the functioning of your device to ensure it is working properly. You are scheduled for a device check from home on March 30, 2016. You may send your transmission at any time that day. If you have a wireless device, the transmission will be sent automatically. After your physician reviews your transmission, you will receive a postcard with your next transmission date.  Dr Sallyanne Kuster recommends that you schedule a follow-up appointment in 1 year. You will receive a reminder letter in the mail two months in advance. If you don't receive a letter, please call our office to schedule the follow-up appointment.  If you need a refill on your cardiac medications before your next appointment, please call your pharmacy.

## 2015-12-30 DIAGNOSIS — I519 Heart disease, unspecified: Secondary | ICD-10-CM | POA: Insufficient documentation

## 2015-12-30 NOTE — Progress Notes (Addendum)
Patient ID: Alex Maddox, male   DOB: May 16, 1951, 65 y.o.   MRN: [id number]    Cardiology Office Note    Date:  12/30/2015   ID:  Alex Maddox, Alex Maddox [birth year], MRN [id number]  PCP:  Rocky Morel, MD  Cardiologist:   Sanda Klein, MD   Chief Complaint  Patient presents with  . Pacemaker Check    MDT BLUE  pt wife states he almost fainted a month ago--he became diaphoretic and dizzy; no other Sx.    History of Present Illness:  Alex Maddox is a 65 y.o. male returning for routine f/u for neurally mediated syncope and pacemaker therapy.  He had a near syncope about a month ago (diaphoresis, dizziness, no actual loss of consciousness). No palpitations, angina, dyspnea or edema.  Medronic Revo dual chamber pacemaker (implanted 2012) check shows normal device function. Battery voltage 2.99V (ERI at 2.81V). He had a 7-hour episode of atrial fibrillation with controlled rate, overall burden only 0.6% (also had a 1-minute episode of atrial flutter with 2:1 conduction). The overall burden of atrial fibrillation is low. He has returned to the level of activity from before his accident (>5 h/day). There is 39% atrial pacing and 0.% ventricular pacing. A 6 second episode of NSVT was seen in Sept 2016. Lead parameters are excellent.  He has a history of neurally mediated syncope with both cardioinhibitory and vasodepressor components and received a dual-chamber Medtronic MRI conditional permanent pacemaker in August 2012. He has a history of paroxysmal atrial fibrillation and a history of TIA at age 89. He had a traumatic right occipital intracerebral hemorrhage while on warfarin anticoagulation in November 2016. He also has systemic hypertension and hyperlipidemia, but no significant structural cardiac illness. Normal nuclear stress test perfusion pattern in 2012, EF 43%; similar EF by echocardiography . He takes a statin for hyperlipidemia as well as valsartan and carvedilol for both the  arrhythmia, the HTN and the cardiomyopathy. He has ascending aortic enlargement (4.2 cm) and fusiform ectasia of the infrarenal abdominal aorta (2.82.5 cm) and CT evidence of coronary calcification.  Past Medical History  Diagnosis Date  . Hypertension   . Stroke ([place])   . Paroxysmal atrial fibrillation (HCC)   . Dyslipidemia   . Tachycardia-bradycardia ([place])   . Sinus node dysfunction (HCC)   . Syncope     2D Echocardiogram 09/27/2010 EF between 40 to 45%  . Transient ischemic attack (TIA) 06/07  . Sleep apnea, obstructive     sleep study 10/09/2007  AHI 5.73/hr  REM AHI 11.90/hr   . Abnormal echocardiogram     stress myoview 10/29/2010  . Arthritis     " IN MY BACK "  . 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    LEFT THUMB   . Presence of permanent cardiac pacemaker     Past Surgical History  Procedure Laterality Date  . Pacemaker insertion  05/30/2011    Medtronic Revo   last checked 11/07/2012  . Loop recorder implant  02/02/2011    Medtronic- CRM   . Insert / replace / remove pacemaker  05/2011  . Electrocardiogram      Outpatient Prescriptions Prior to Visit  Medication Sig Dispense Refill  . carvedilol (COREG) 25 MG tablet TAKE ONE TABLET BY MOUTH TWICE A DAY WITH A MEAL 60 tablet 1  . fish oil-omega-3 fatty acids 1000 MG capsule Take 1 g by mouth daily.    Marland Kitchen FLUVIRIN SUSP Inject 1 application into the  muscle once.  0  . furosemide (LASIX) 20 MG tablet   0  . Multiple Vitamin (MULTIVITAMIN) tablet Take 1 tablet by mouth daily.    . saw palmetto 160 MG capsule Take 160 mg by mouth daily.    . sildenafil (REVATIO) 20 MG tablet TAKE 1 TABLET 1 HOUR BEFORE NEEDED 10 tablet 6  . simvastatin (ZOCOR) 40 MG tablet TAKE ONE (1) TABLET EACH DAY 90 tablet 0  . valsartan (DIOVAN) 320 MG tablet Take 1 tablet (320 mg total) by mouth daily. 90 tablet 0  . warfarin (COUMADIN) 5 MG tablet TAKE ONE TABLET DAILY EXCEPT 1/2 TABLET ON FRIDAYS. (Patient taking differently: TAKE 1  TABLET EVERY DAY EXCEPT 1 &1/2 TABLET ON WEDNESDAY) 30 tablet 6   No facility-administered medications prior to visit.     Allergies:   Review of patient's allergies indicates no known allergies.   Social History   Social History  . Marital Status: Married    Spouse Name: N/A  . Number of Children: 0  . Years of Education: N/A   Social History Main Topics  . Smoking status: Never Smoker   . Smokeless tobacco: Never Used  . Alcohol Use: No  . Drug Use: No  . Sexual Activity: Not Asked   Other Topics Concern  . 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     Family History:  The patient's family history includes Heart attack (age of onset: 56) in his father; Hypertension in his brother and mother.   ROS:   Please see the history of present illness.    ROS All other systems reviewed and are negative.   PHYSICAL EXAM:   VS:  BP 151/96 mmHg  Pulse 67  Ht 6' (1.829 m)  Wt 88.542 kg (195 lb 3.2 oz)  BMI 26.47 kg/m2   Recheck BP 138/88 mm Hg GEN: Well nourished, well developed, in no acute distress HEENT: normal Neck: no JVD, carotid bruits, or masses Cardiac: RRR widely split S2; no murmurs, rubs, or gallops,no edema  Respiratory:  clear to auscultation bilaterally, normal work of breathing GI: soft, nontender, nondistended, + BS MS: no deformity or atrophy Skin: warm and dry, no rash Neuro:  Alert and Oriented x 3, Strength and sensation are intact Psych: euthymic mood, full affect  Wt Readings from Last 3 Encounters:  12/28/15 88.542 kg (195 lb 3.2 oz)  09/08/14 87.454 kg (192 lb 12.8 oz)  08/13/14 86.183 kg (190 lb)      Studies/Labs Reviewed:   EKG:  EKG is ordered today.  The ekg ordered today demonstrates A paced, V sensed , incomplete RBBB (old), QTc 443 ms.   ASSESSMENT:    1. Paroxysmal atrial fibrillation (HCC)   2. Long term current use of anticoagulant therapy   3. Neurocardiogenic syncope   4. Pacemaker  - Medtronic REVO dual-chamber implanted August 2012   5. Ascending aortic aneurysm ([place])   6. Essential hypertension   7. Coronary artery calcification seen on CT scan   8. Left ventricular dysfunction      PLAN:  In order of problems listed above:  1. PAF:  No recent events, back on anticoagulation. CHADS Vasc 5 (TIA 2, HTN, vascular disease, LV dysfunction) 2. His only bleeding complications have been related to accidental trauma 3. Neurally mediated syncope: still has occasional symptoms due to vasodepressor response, but has not had full blown syncope since pacemaker implantation 4. PPM:  Normal device  function, Carelink Q 3 mos. 5. Aortic aneurysm:  Review in approximately 1 year 6. HTN: imperfect control, try to avoid diuretics and other agents that can increase risk of syncope  (stoping diuretics had a positive impact on syncope prevention). He is also troubled by ED and wants to avoid meds that worsen that. 7. Coronary Ca:  Asymptomatic, normal nuclear study 2012. On statin 8. LV dysfunction:  Without clinical HF despite a very active lifestyle. On ACEi and beta blockers. Clinically euvolemic   Medication Adjustments/Labs and Tests Ordered: Current medicines are reviewed at length with the patient today.  Concerns regarding medicines are outlined above.  Medication changes, Labs and Tests ordered today are listed in the Patient Instructions below. Patient Instructions  Dr Sallyanne Kuster recommends that you continue on your current medications as directed. Please refer to the Current Medication list given to you today.  Remote monitoring is used to monitor your Pacemaker or ICD from home. This monitoring reduces the number of office visits required to check your device to one time per year. It allows [name] to keep an eye on the functioning of your device to ensure it is working properly. You are scheduled for a device check from home on March 30, 2016. You may send your transmission at any  time that day. If you have a wireless device, the transmission will be sent automatically. After your physician reviews your transmission, you will receive a postcard with your next transmission date.  Dr Sallyanne Kuster recommends that you schedule a follow-up appointment in 1 year. You will receive a reminder letter in the mail two months in advance. If you don't receive a letter, please call our office to schedule the follow-up appointment.  If you need a refill on your cardiac medications before your next appointment, please call your pharmacy.      Mikael Spray, MD  12/30/2015 9:43 PM    [facility] [address], [place], [place]  [address] Phone: [phone]; Fax: [phone]

## 2015-12-31 LAB — CUP PACEART INCLINIC DEVICE CHECK
Brady Statistic AP VP Percent: 0.19 %
Brady Statistic AP VS Percent: 38.42 %
Brady Statistic AS VS Percent: 61.32 %
Brady Statistic RV Percent Paced: 0.26 %
Date Time Interrogation Session: 20170320201420
Implantable Lead Implant Date: 20120820
Implantable Lead Location: 753859
Implantable Lead Model: 5086
Lead Channel Impedance Value: 424 Ohm
Lead Channel Sensing Intrinsic Amplitude: 4.972 mV
Lead Channel Setting Pacing Amplitude: 2 V
Lead Channel Setting Pacing Amplitude: 2.5 V
Lead Channel Setting Pacing Pulse Width: 0.6 ms
MDC IDC LEAD IMPLANT DT: 20120820
MDC IDC LEAD LOCATION: 753860
MDC IDC LEAD MODEL: 5086
MDC IDC MSMT BATTERY VOLTAGE: 2.99 V
MDC IDC MSMT LEADCHNL RV IMPEDANCE VALUE: 448 Ohm
MDC IDC MSMT LEADCHNL RV SENSING INTR AMPL: 12.829 mV
MDC IDC SET LEADCHNL RV SENSING SENSITIVITY: 0.9 mV
MDC IDC STAT BRADY AS VP PERCENT: 0.07 %
MDC IDC STAT BRADY RA PERCENT PACED: 38.61 %

## 2016-01-12 ENCOUNTER — Other Ambulatory Visit: Payer: Self-pay | Admitting: Cardiovascular Disease

## 2016-01-13 ENCOUNTER — Ambulatory Visit (INDEPENDENT_AMBULATORY_CARE_PROVIDER_SITE_OTHER): Payer: BLUE CROSS/BLUE SHIELD | Admitting: *Deleted

## 2016-01-13 DIAGNOSIS — I4891 Unspecified atrial fibrillation: Secondary | ICD-10-CM

## 2016-01-13 DIAGNOSIS — Z7901 Long term (current) use of anticoagulants: Secondary | ICD-10-CM

## 2016-01-13 DIAGNOSIS — Z5181 Encounter for therapeutic drug level monitoring: Secondary | ICD-10-CM | POA: Diagnosis not present

## 2016-01-13 LAB — POCT INR: INR: 4

## 2016-01-14 DIAGNOSIS — M722 Plantar fascial fibromatosis: Secondary | ICD-10-CM

## 2016-01-18 ENCOUNTER — Other Ambulatory Visit: Payer: Self-pay | Admitting: Cardiovascular Disease

## 2016-01-18 NOTE — Telephone Encounter (Signed)
Rx request sent to pharmacy.  

## 2016-01-27 ENCOUNTER — Ambulatory Visit (INDEPENDENT_AMBULATORY_CARE_PROVIDER_SITE_OTHER): Payer: BLUE CROSS/BLUE SHIELD | Admitting: *Deleted

## 2016-01-27 DIAGNOSIS — Z5181 Encounter for therapeutic drug level monitoring: Secondary | ICD-10-CM

## 2016-01-27 DIAGNOSIS — I4891 Unspecified atrial fibrillation: Secondary | ICD-10-CM

## 2016-01-27 DIAGNOSIS — Z7901 Long term (current) use of anticoagulants: Secondary | ICD-10-CM

## 2016-01-27 LAB — POCT INR: INR: 2.8

## 2016-02-04 ENCOUNTER — Other Ambulatory Visit: Payer: Self-pay | Admitting: Cardiovascular Disease

## 2016-02-04 NOTE — Telephone Encounter (Signed)
Rx refill sent to pharmacy. 

## 2016-02-24 ENCOUNTER — Ambulatory Visit (INDEPENDENT_AMBULATORY_CARE_PROVIDER_SITE_OTHER): Payer: BLUE CROSS/BLUE SHIELD | Admitting: *Deleted

## 2016-02-24 DIAGNOSIS — I4891 Unspecified atrial fibrillation: Secondary | ICD-10-CM | POA: Diagnosis not present

## 2016-02-24 DIAGNOSIS — Z7901 Long term (current) use of anticoagulants: Secondary | ICD-10-CM | POA: Diagnosis not present

## 2016-02-24 DIAGNOSIS — Z5181 Encounter for therapeutic drug level monitoring: Secondary | ICD-10-CM

## 2016-02-24 LAB — POCT INR: INR: 3

## 2016-03-23 ENCOUNTER — Ambulatory Visit (INDEPENDENT_AMBULATORY_CARE_PROVIDER_SITE_OTHER): Payer: BLUE CROSS/BLUE SHIELD | Admitting: *Deleted

## 2016-03-23 DIAGNOSIS — Z5181 Encounter for therapeutic drug level monitoring: Secondary | ICD-10-CM

## 2016-03-23 DIAGNOSIS — Z7901 Long term (current) use of anticoagulants: Secondary | ICD-10-CM

## 2016-03-23 DIAGNOSIS — I4891 Unspecified atrial fibrillation: Secondary | ICD-10-CM

## 2016-03-23 LAB — POCT INR: INR: 2.9

## 2016-03-30 ENCOUNTER — Ambulatory Visit (INDEPENDENT_AMBULATORY_CARE_PROVIDER_SITE_OTHER): Payer: BLUE CROSS/BLUE SHIELD | Admitting: *Deleted

## 2016-03-30 ENCOUNTER — Telehealth: Payer: Self-pay | Admitting: Cardiology

## 2016-03-30 DIAGNOSIS — Z950 Presence of cardiac pacemaker: Secondary | ICD-10-CM | POA: Diagnosis not present

## 2016-03-30 DIAGNOSIS — I4891 Unspecified atrial fibrillation: Secondary | ICD-10-CM | POA: Diagnosis not present

## 2016-03-30 NOTE — Telephone Encounter (Signed)
LMOVM reminding pt to send remote transmission.   

## 2016-04-01 ENCOUNTER — Encounter: Payer: Self-pay | Admitting: Cardiology

## 2016-04-01 NOTE — Progress Notes (Signed)
Remote pacemaker transmission.   

## 2016-04-05 LAB — CUP PACEART REMOTE DEVICE CHECK
Brady Statistic AP VP Percent: 0.32 %
Brady Statistic AP VS Percent: 48.3 %
Brady Statistic AS VS Percent: 51.15 %
Implantable Lead Implant Date: 20120820
Lead Channel Impedance Value: 424 Ohm
Lead Channel Sensing Intrinsic Amplitude: 4.708 mV
Lead Channel Setting Pacing Pulse Width: 0.6 ms
MDC IDC LEAD IMPLANT DT: 20120820
MDC IDC LEAD LOCATION: 753859
MDC IDC LEAD LOCATION: 753860
MDC IDC LEAD MODEL: 5086
MDC IDC LEAD MODEL: 5086
MDC IDC MSMT BATTERY VOLTAGE: 2.99 V
MDC IDC MSMT LEADCHNL RV IMPEDANCE VALUE: 480 Ohm
MDC IDC MSMT LEADCHNL RV SENSING INTR AMPL: 4.934 mV
MDC IDC SESS DTM: 20170623011714
MDC IDC SET LEADCHNL RA PACING AMPLITUDE: 2 V
MDC IDC SET LEADCHNL RV PACING AMPLITUDE: 2.5 V
MDC IDC SET LEADCHNL RV SENSING SENSITIVITY: 0.9 mV
MDC IDC STAT BRADY AS VP PERCENT: 0.24 %
MDC IDC STAT BRADY RA PERCENT PACED: 48.61 %
MDC IDC STAT BRADY RV PERCENT PACED: 0.55 %

## 2016-04-13 ENCOUNTER — Ambulatory Visit (INDEPENDENT_AMBULATORY_CARE_PROVIDER_SITE_OTHER): Payer: BLUE CROSS/BLUE SHIELD | Admitting: *Deleted

## 2016-04-13 DIAGNOSIS — I4891 Unspecified atrial fibrillation: Secondary | ICD-10-CM | POA: Diagnosis not present

## 2016-04-13 DIAGNOSIS — Z7901 Long term (current) use of anticoagulants: Secondary | ICD-10-CM | POA: Diagnosis not present

## 2016-04-13 DIAGNOSIS — Z5181 Encounter for therapeutic drug level monitoring: Secondary | ICD-10-CM

## 2016-04-13 LAB — POCT INR: INR: 2.7

## 2016-05-06 ENCOUNTER — Other Ambulatory Visit: Payer: Self-pay | Admitting: Cardiovascular Disease

## 2016-05-11 ENCOUNTER — Ambulatory Visit (INDEPENDENT_AMBULATORY_CARE_PROVIDER_SITE_OTHER): Payer: BLUE CROSS/BLUE SHIELD | Admitting: *Deleted

## 2016-05-11 DIAGNOSIS — Z7901 Long term (current) use of anticoagulants: Secondary | ICD-10-CM | POA: Diagnosis not present

## 2016-05-11 DIAGNOSIS — Z5181 Encounter for therapeutic drug level monitoring: Secondary | ICD-10-CM | POA: Diagnosis not present

## 2016-05-11 DIAGNOSIS — I4891 Unspecified atrial fibrillation: Secondary | ICD-10-CM

## 2016-05-11 LAB — POCT INR: INR: 3.9

## 2016-06-01 ENCOUNTER — Ambulatory Visit (INDEPENDENT_AMBULATORY_CARE_PROVIDER_SITE_OTHER): Payer: BLUE CROSS/BLUE SHIELD | Admitting: *Deleted

## 2016-06-01 DIAGNOSIS — Z5181 Encounter for therapeutic drug level monitoring: Secondary | ICD-10-CM

## 2016-06-01 DIAGNOSIS — I4891 Unspecified atrial fibrillation: Secondary | ICD-10-CM

## 2016-06-01 DIAGNOSIS — Z7901 Long term (current) use of anticoagulants: Secondary | ICD-10-CM | POA: Diagnosis not present

## 2016-06-01 LAB — POCT INR: INR: 3.4

## 2016-06-29 ENCOUNTER — Ambulatory Visit (INDEPENDENT_AMBULATORY_CARE_PROVIDER_SITE_OTHER): Payer: BLUE CROSS/BLUE SHIELD | Admitting: *Deleted

## 2016-06-29 DIAGNOSIS — I4891 Unspecified atrial fibrillation: Secondary | ICD-10-CM

## 2016-06-29 DIAGNOSIS — Z5181 Encounter for therapeutic drug level monitoring: Secondary | ICD-10-CM | POA: Diagnosis not present

## 2016-06-29 DIAGNOSIS — Z7901 Long term (current) use of anticoagulants: Secondary | ICD-10-CM | POA: Diagnosis not present

## 2016-06-29 LAB — POCT INR: INR: 2.1

## 2016-07-04 ENCOUNTER — Ambulatory Visit (INDEPENDENT_AMBULATORY_CARE_PROVIDER_SITE_OTHER): Payer: BLUE CROSS/BLUE SHIELD | Admitting: *Deleted

## 2016-07-04 ENCOUNTER — Telehealth: Payer: Self-pay | Admitting: Cardiology

## 2016-07-04 DIAGNOSIS — Z950 Presence of cardiac pacemaker: Secondary | ICD-10-CM

## 2016-07-04 DIAGNOSIS — I480 Paroxysmal atrial fibrillation: Secondary | ICD-10-CM

## 2016-07-04 NOTE — Telephone Encounter (Signed)
LMOVM reminding pt to send remote transmission.   

## 2016-07-05 NOTE — Progress Notes (Signed)
Remote pacemaker transmission.   

## 2016-07-06 ENCOUNTER — Encounter: Payer: Self-pay | Admitting: Cardiology

## 2016-07-27 ENCOUNTER — Ambulatory Visit (INDEPENDENT_AMBULATORY_CARE_PROVIDER_SITE_OTHER): Payer: BLUE CROSS/BLUE SHIELD | Admitting: *Deleted

## 2016-07-27 DIAGNOSIS — Z7901 Long term (current) use of anticoagulants: Secondary | ICD-10-CM | POA: Diagnosis not present

## 2016-07-27 DIAGNOSIS — I4891 Unspecified atrial fibrillation: Secondary | ICD-10-CM

## 2016-07-27 DIAGNOSIS — Z5181 Encounter for therapeutic drug level monitoring: Secondary | ICD-10-CM

## 2016-07-27 LAB — POCT INR: INR: 2.5

## 2016-07-28 LAB — CUP PACEART REMOTE DEVICE CHECK
Brady Statistic AP VP Percent: 0.66 %
Brady Statistic AP VS Percent: 47.38 %
Brady Statistic AS VP Percent: 0.17 %
Brady Statistic RA Percent Paced: 48.04 %
Brady Statistic RV Percent Paced: 0.83 %
Date Time Interrogation Session: 20170926022915
Implantable Lead Implant Date: 20120820
Implantable Lead Location: 753859
Implantable Lead Model: 5086
Lead Channel Impedance Value: 424 Ohm
Lead Channel Sensing Intrinsic Amplitude: 8.224 mV
Lead Channel Setting Pacing Amplitude: 2 V
Lead Channel Setting Pacing Amplitude: 2.5 V
Lead Channel Setting Pacing Pulse Width: 0.6 ms
Lead Channel Setting Sensing Sensitivity: 0.9 mV
MDC IDC LEAD IMPLANT DT: 20120820
MDC IDC LEAD LOCATION: 753860
MDC IDC LEAD MODEL: 5086
MDC IDC MSMT BATTERY VOLTAGE: 2.99 V
MDC IDC MSMT LEADCHNL RA SENSING INTR AMPL: 4.356 mV
MDC IDC MSMT LEADCHNL RV IMPEDANCE VALUE: 448 Ohm
MDC IDC STAT BRADY AS VS PERCENT: 51.79 %

## 2016-08-08 ENCOUNTER — Other Ambulatory Visit: Payer: Self-pay | Admitting: Cardiovascular Disease

## 2016-08-29 ENCOUNTER — Ambulatory Visit (INDEPENDENT_AMBULATORY_CARE_PROVIDER_SITE_OTHER): Payer: BLUE CROSS/BLUE SHIELD | Admitting: *Deleted

## 2016-08-29 DIAGNOSIS — Z5181 Encounter for therapeutic drug level monitoring: Secondary | ICD-10-CM

## 2016-08-29 DIAGNOSIS — I4891 Unspecified atrial fibrillation: Secondary | ICD-10-CM

## 2016-08-29 DIAGNOSIS — Z7901 Long term (current) use of anticoagulants: Secondary | ICD-10-CM | POA: Diagnosis not present

## 2016-08-29 LAB — POCT INR: INR: 1.7

## 2016-09-19 ENCOUNTER — Ambulatory Visit (INDEPENDENT_AMBULATORY_CARE_PROVIDER_SITE_OTHER): Payer: BLUE CROSS/BLUE SHIELD | Admitting: *Deleted

## 2016-09-19 DIAGNOSIS — Z7901 Long term (current) use of anticoagulants: Secondary | ICD-10-CM | POA: Diagnosis not present

## 2016-09-19 DIAGNOSIS — Z5181 Encounter for therapeutic drug level monitoring: Secondary | ICD-10-CM | POA: Diagnosis not present

## 2016-09-19 DIAGNOSIS — I4891 Unspecified atrial fibrillation: Secondary | ICD-10-CM

## 2016-09-19 LAB — POCT INR: INR: 1.8

## 2016-10-05 ENCOUNTER — Ambulatory Visit (INDEPENDENT_AMBULATORY_CARE_PROVIDER_SITE_OTHER): Payer: BLUE CROSS/BLUE SHIELD | Admitting: *Deleted

## 2016-10-05 ENCOUNTER — Telehealth: Payer: Self-pay | Admitting: Cardiology

## 2016-10-05 DIAGNOSIS — Z7901 Long term (current) use of anticoagulants: Secondary | ICD-10-CM

## 2016-10-05 DIAGNOSIS — I4891 Unspecified atrial fibrillation: Secondary | ICD-10-CM

## 2016-10-05 DIAGNOSIS — Z5181 Encounter for therapeutic drug level monitoring: Secondary | ICD-10-CM

## 2016-10-05 LAB — POCT INR: INR: 2.4

## 2016-10-05 NOTE — Telephone Encounter (Signed)
LMOVM reminding pt to send remote transmission.   

## 2016-10-13 NOTE — Progress Notes (Signed)
Remote pacemaker transmission.   

## 2016-10-14 ENCOUNTER — Encounter: Payer: Self-pay | Admitting: Cardiology

## 2016-10-14 LAB — CUP PACEART REMOTE DEVICE CHECK
Brady Statistic AP VP Percent: 0.18 %
Brady Statistic AP VS Percent: 37.05 %
Brady Statistic AS VP Percent: 0.18 %
Brady Statistic AS VS Percent: 62.6 %
Brady Statistic RV Percent Paced: 0.36 %
Implantable Lead Implant Date: 20120820
Implantable Lead Model: 5086
Implantable Lead Model: 5086
Lead Channel Impedance Value: 408 Ohm
Lead Channel Impedance Value: 464 Ohm
Lead Channel Sensing Intrinsic Amplitude: 5.148 mV
Lead Channel Setting Pacing Amplitude: 2 V
Lead Channel Setting Pacing Amplitude: 2.5 V
Lead Channel Setting Pacing Pulse Width: 0.6 ms
MDC IDC LEAD IMPLANT DT: 20120820
MDC IDC LEAD LOCATION: 753859
MDC IDC LEAD LOCATION: 753860
MDC IDC MSMT BATTERY VOLTAGE: 2.97 V
MDC IDC MSMT LEADCHNL RV SENSING INTR AMPL: 9.54 mV
MDC IDC PG IMPLANT DT: 20120820
MDC IDC SESS DTM: 20171229043018
MDC IDC SET LEADCHNL RV SENSING SENSITIVITY: 0.9 mV
MDC IDC STAT BRADY RA PERCENT PACED: 36.37 %

## 2016-11-02 ENCOUNTER — Ambulatory Visit (INDEPENDENT_AMBULATORY_CARE_PROVIDER_SITE_OTHER): Payer: BLUE CROSS/BLUE SHIELD | Admitting: *Deleted

## 2016-11-02 DIAGNOSIS — Z5181 Encounter for therapeutic drug level monitoring: Secondary | ICD-10-CM

## 2016-11-02 DIAGNOSIS — I4891 Unspecified atrial fibrillation: Secondary | ICD-10-CM | POA: Diagnosis not present

## 2016-11-02 DIAGNOSIS — Z7901 Long term (current) use of anticoagulants: Secondary | ICD-10-CM

## 2016-11-02 LAB — POCT INR: INR: 2

## 2016-11-30 ENCOUNTER — Ambulatory Visit (INDEPENDENT_AMBULATORY_CARE_PROVIDER_SITE_OTHER): Payer: BLUE CROSS/BLUE SHIELD | Admitting: *Deleted

## 2016-11-30 DIAGNOSIS — Z7901 Long term (current) use of anticoagulants: Secondary | ICD-10-CM

## 2016-11-30 DIAGNOSIS — I4891 Unspecified atrial fibrillation: Secondary | ICD-10-CM | POA: Diagnosis not present

## 2016-11-30 DIAGNOSIS — Z5181 Encounter for therapeutic drug level monitoring: Secondary | ICD-10-CM

## 2016-11-30 LAB — POCT INR: INR: 1.9

## 2016-12-21 ENCOUNTER — Ambulatory Visit (INDEPENDENT_AMBULATORY_CARE_PROVIDER_SITE_OTHER): Payer: BLUE CROSS/BLUE SHIELD | Admitting: *Deleted

## 2016-12-21 DIAGNOSIS — I4891 Unspecified atrial fibrillation: Secondary | ICD-10-CM

## 2016-12-21 DIAGNOSIS — Z7901 Long term (current) use of anticoagulants: Secondary | ICD-10-CM

## 2016-12-21 DIAGNOSIS — Z5181 Encounter for therapeutic drug level monitoring: Secondary | ICD-10-CM | POA: Diagnosis not present

## 2016-12-21 LAB — POCT INR: INR: 2.3

## 2016-12-28 ENCOUNTER — Encounter: Payer: Self-pay | Admitting: Cardiovascular Disease

## 2016-12-28 ENCOUNTER — Ambulatory Visit (INDEPENDENT_AMBULATORY_CARE_PROVIDER_SITE_OTHER): Payer: BLUE CROSS/BLUE SHIELD | Admitting: Cardiovascular Disease

## 2016-12-28 VITALS — BP 137/88 | HR 65 | Ht 72.0 in | Wt 199.0 lb

## 2016-12-28 DIAGNOSIS — I2510 Atherosclerotic heart disease of native coronary artery without angina pectoris: Secondary | ICD-10-CM | POA: Diagnosis not present

## 2016-12-28 DIAGNOSIS — I480 Paroxysmal atrial fibrillation: Secondary | ICD-10-CM

## 2016-12-28 DIAGNOSIS — R55 Syncope and collapse: Secondary | ICD-10-CM

## 2016-12-28 DIAGNOSIS — I519 Heart disease, unspecified: Secondary | ICD-10-CM | POA: Diagnosis not present

## 2016-12-28 DIAGNOSIS — I10 Essential (primary) hypertension: Secondary | ICD-10-CM

## 2016-12-28 DIAGNOSIS — I712 Thoracic aortic aneurysm, without rupture: Secondary | ICD-10-CM

## 2016-12-28 DIAGNOSIS — I714 Abdominal aortic aneurysm, without rupture, unspecified: Secondary | ICD-10-CM

## 2016-12-28 DIAGNOSIS — Z7901 Long term (current) use of anticoagulants: Secondary | ICD-10-CM | POA: Diagnosis not present

## 2016-12-28 DIAGNOSIS — Z950 Presence of cardiac pacemaker: Secondary | ICD-10-CM | POA: Diagnosis not present

## 2016-12-28 DIAGNOSIS — I7121 Aneurysm of the ascending aorta, without rupture: Secondary | ICD-10-CM

## 2016-12-28 LAB — CUP PACEART INCLINIC DEVICE CHECK
Battery Voltage: 2.97 V
Brady Statistic AP VS Percent: 42.09 %
Brady Statistic AS VP Percent: 0.18 %
Brady Statistic AS VS Percent: 57.32 %
Brady Statistic RA Percent Paced: 41.55 %
Date Time Interrogation Session: 20180321151041
Implantable Lead Location: 753859
Lead Channel Impedance Value: 440 Ohm
Lead Channel Impedance Value: 440 Ohm
Lead Channel Pacing Threshold Amplitude: 1 V
Lead Channel Pacing Threshold Pulse Width: 0.6 ms
Lead Channel Sensing Intrinsic Amplitude: 6.424 mV
Lead Channel Setting Pacing Amplitude: 2 V
Lead Channel Setting Sensing Sensitivity: 0.9 mV
MDC IDC LEAD IMPLANT DT: 20120820
MDC IDC LEAD IMPLANT DT: 20120820
MDC IDC LEAD LOCATION: 753860
MDC IDC MSMT LEADCHNL RA PACING THRESHOLD AMPLITUDE: 1 V
MDC IDC MSMT LEADCHNL RA PACING THRESHOLD PULSEWIDTH: 0.6 ms
MDC IDC MSMT LEADCHNL RV SENSING INTR AMPL: 7.566 mV
MDC IDC PG IMPLANT DT: 20120820
MDC IDC SET LEADCHNL RV PACING AMPLITUDE: 2.5 V
MDC IDC SET LEADCHNL RV PACING PULSEWIDTH: 0.6 ms
MDC IDC STAT BRADY AP VP PERCENT: 0.4 %
MDC IDC STAT BRADY RV PERCENT PACED: 0.6 %

## 2016-12-28 NOTE — Progress Notes (Signed)
Patient ID: Alex Maddox, male   DOB: 1951-02-20, 67 y.o.   MRN: [id number]    Cardiology Office Note    Date:  12/30/2016   ID:  Alex Maddox, DOB 10-10-1951, MRN [id number]  PCP:  Glo Herring., MD  Cardiologist:   Sanda Klein, MD   Chief Complaint  Patient presents with  . Follow-up    History of Present Illness:  Alex Maddox is a 66 y.o. male returning for routine f/u for Excisional atrial fibrillation, neurally mediated syncope and pacemaker therapy.  [name] is doing well. He has not had any recent episodes of syncope or near syncope. He has rare palpitations. He has gained weight and his wife reports that he overindulges in sweets. Has not had any injuries or bleeding problems on chronic warfarin anticoagulation since his fall November 2016.  He recently underwent a workplace lifeline evaluation with good results. He had normal bilateral ABI. Total cholesterol 105/HDL 32/LDL 49/triglycerides 123, glucose 101, creatinine 1.0.  Pacemaker interrogation shows normal device function. Battery voltage is good. His device is a Medtronic Revo dual-chamber pacemaker implanted in 2012. He has 43% atrial pacing and 0.6% ventricular pacing. His burden of atrial fibrillation is 2.2%. He has episodes of atrial fibrillation that last for process may 4-12 hours once or twice a month. Activity level remains excellent at 5.5 hours a day. Been no significant episodes of ventricular arrhythmia.  He has a history of neurally mediated syncope with both cardioinhibitory and vasodepressor components and received a dual-chamber Medtronic MRI conditional permanent pacemaker in August 2012. He has a history of paroxysmal atrial fibrillation and a history of TIA at age 81. He had a traumatic right occipital intracerebral hemorrhage while on warfarin anticoagulation in November 2016. He also has systemic hypertension and hyperlipidemia, but no significant structural cardiac illness. Incidental note of  coronary artery calcification seen on CT chest. Normal nuclear stress test perfusion pattern in 2012, EF 43%; similar EF by echocardiography. Echo images are very difficult and follow-up echo in 2015 could not quantify EF. He has not had clinical heart failure. He takes a statin for hyperlipidemia as well as valsartan and carvedilol for both the arrhythmia, the HTN and the cardiomyopathy. He has ascending aortic enlargement (4.2 cm) and fusiform ectasia of the infrarenal abdominal aorta (2.82.5 cm) and CT evidence of coronary calcification.  Past Medical History:  Diagnosis Date  . Abnormal echocardiogram    stress myoview 10/29/2010  . Arthritis    " IN MY BACK "  . Dyslipidemia   . Hypertension   . Paroxysmal atrial fibrillation (HCC)   . Presence of permanent cardiac pacemaker   . Sinus node dysfunction (HCC)   . Sleep apnea, obstructive    sleep study 10/09/2007  AHI 5.73/hr  REM AHI 11.90/hr   . 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   LEFT THUMB   . Stroke ([place])   . Syncope    2D Echocardiogram 09/27/2010 EF between 40 to 45%  . Tachycardia-bradycardia ([place])   . Transient ischemic attack (TIA) 06/07    Past Surgical History:  Procedure Laterality Date  . ELECTROCARDIOGRAM    . INSERT / REPLACE / REMOVE PACEMAKER  05/2011  . LOOP RECORDER IMPLANT  02/02/2011   Medtronic- CRM   . PACEMAKER INSERTION  05/30/2011   Medtronic Revo   last checked 11/07/2012    Outpatient Medications Prior to Visit  Medication Sig Dispense Refill  . carvedilol (COREG) 25 MG tablet TAKE ONE  TABLET TWICE DAILY WITH A MEAL 60 tablet 11  . cetirizine (ZYRTEC) 10 MG tablet Take 10 mg by mouth daily.    . fish oil-omega-3 fatty acids 1000 MG capsule Take 1 g by mouth daily.    Marland Kitchen FLUVIRIN SUSP Inject 1 application into the muscle once.  0  . furosemide (LASIX) 20 MG tablet   0  . Multiple Vitamin (MULTIVITAMIN) tablet Take 1 tablet by mouth daily.    . saw palmetto 160 MG capsule Take 160 mg  by mouth daily.    . sildenafil (REVATIO) 20 MG tablet TAKE 1 TABLET 1 HOUR BEFORE NEEDED 10 tablet 6  . simvastatin (ZOCOR) 40 MG tablet TAKE ONE (1) TABLET EACH DAY 90 tablet 3  . valsartan (DIOVAN) 320 MG tablet TAKE ONE (1) TABLET EACH DAY 90 tablet 2  . warfarin (COUMADIN) 5 MG tablet TAKE ONE TO ONE AND ONE-HALF TABLETS BY MOUTH DAILY AS DIRECTED BY COUMADIN CLINIC 40 tablet 6   No facility-administered medications prior to visit.      Allergies:   Patient has no known allergies.   Social History   Social History  . Marital status: Married    Spouse name: N/A  . Number of children: 0  . Years of education: N/A   Social History Main Topics  . Smoking status: Never Smoker  . Smokeless tobacco: Never Used  . Alcohol use No  . Drug use: No  . Sexual activity: Not Asked   Other Topics Concern  . 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     Family History:  The patient's family history includes Heart attack (age of onset: 24) in his father; Hypertension in his brother and mother.   ROS:   Please see the history of present illness.    ROS All other systems reviewed and are negative.   PHYSICAL EXAM:   VS:  BP 137/88 (BP Location: Right Arm, Patient Position: Sitting, Cuff Size: Normal)   Pulse 65   Ht 6' (1.829 m)   Wt 90.3 kg (199 lb)   BMI 26.99 kg/m    Recheck BP 138/88 mm Hg GEN: Well nourished, well developed, in no acute distress  HEENT: normal  Neck: no JVD, carotid bruits, or masses Cardiac: RRR widely split S2; no murmurs, rubs, or gallops,no edema  Respiratory:  clear to auscultation bilaterally, normal work of breathing GI: soft, nontender, nondistended, + BS MS: no deformity or atrophy  Skin: warm and dry, no rash Neuro:  Alert and Oriented x 3, Strength and sensation are intact Psych: euthymic mood, full affect  Wt Readings from Last 3 Encounters:  12/28/16 90.3 kg (199 lb)  12/28/15 88.5 kg (195 lb 3.2  oz)  09/08/14 87.5 kg (192 lb 12.8 oz)      Studies/Labs Reviewed:   EKG:  EKG is ordered today.  The ekg ordered today demonstrates Normal sinus rhythm, left atrial abnormality, RBBB (old), QTc 455 ms.   ASSESSMENT:    1. Paroxysmal atrial fibrillation (HCC)   2. Long term current use of anticoagulant therapy   3. Neurocardiogenic syncope   4. Pacemaker - Medtronic REVO dual-chamber implanted August 2012   5. Ascending aortic aneurysm (HCC)   6. Abdominal aortic aneurysm (AAA) without rupture ([place])   7. Essential hypertension   8. Coronary artery calcification seen on CT scan   9. Left ventricular dysfunction      PLAN:  In order of  problems listed above:  1. PAF:  There has been a slight increase in the frequency of his events, but the episodes are minimally symptomatic and rate control is good. On anticoagulation. CHADS Vasc 5 (TIA 2, HTN, vascular disease, LV dysfunction) 2. Warfarin: His only bleeding complications have been related to accidental trauma 3. Neurally mediated syncope: has not had full blown syncope since pacemaker implantation. Since his last appointment he has not had even the presyncopal events that we think are related to vasodepressor mechanism.  4. PPM:  Normal device function, Carelink Q 3 mos. 5. Asc Ao aneurysm:  Time to reassess his ascending thoracic aortic aneurysm (4.2 cm) and his small fusiform infrarenal abdominal aortic aneurysm (2.8 cm) by CT angiography. 6. AAA: as above  7. HTN: imperfect control, try to avoid diuretics and other agents that can increase risk of syncope (stopping diuretics had a positive impact on syncope prevention).  8. Coronary Ca:  Asymptomatic, normal nuclear study 2012. On statin 9. LV dysfunction:  Without clinical HF despite a very active lifestyle. On ACEi and beta blockers. Clinically euvolemic. Avoid diuretics if possible.   Medication Adjustments/Labs and Tests Ordered: Current medicines are reviewed at length  with the patient today.  Concerns regarding medicines are outlined above.  Medication changes, Labs and Tests ordered today are listed in the Patient Instructions below. Patient Instructions  Dr Sallyanne Kuster recommends that you continue on your current medications as directed. Please refer to the Current Medication list given to you today.  Remote monitoring is used to monitor your Pacemaker of ICD from home. This monitoring reduces the number of office visits required to check your device to one time per year. It allows [name] to keep an eye on the functioning of your device to ensure it is working properly. You are scheduled for a device check from home on Wednesday, June 20th, 2018. You may send your transmission at any time that day. If you have a wireless device, the transmission will be sent automatically. After your physician reviews your transmission, you will receive a postcard with your next transmission date.  Dr Sallyanne Kuster recommends that you schedule a follow-up appointment in 12 months with a pacemaker check. You will receive a reminder letter in the mail two months in advance. If you don't receive a letter, please call our office to schedule the follow-up appointment.  If you need a refill on your cardiac medications before your next appointment, please call your pharmacy.      Signed, Sanda Klein, MD  12/30/2016 3:58 PM    [address] [address], [place], [place]  [address] Phone: [phone]; Fax: [phone]

## 2016-12-28 NOTE — Patient Instructions (Signed)
Dr Sallyanne Kuster recommends that you continue on your current medications as directed. Please refer to the Current Medication list given to you today.  Remote monitoring is used to monitor your Pacemaker of ICD from home. This monitoring reduces the number of office visits required to check your device to one time per year. It allows [name] to keep an eye on the functioning of your device to ensure it is working properly. You are scheduled for a device check from home on Wednesday, June 20th, 2018. You may send your transmission at any time that day. If you have a wireless device, the transmission will be sent automatically. After your physician reviews your transmission, you will receive a postcard with your next transmission date.  Dr Sallyanne Kuster recommends that you schedule a follow-up appointment in 12 months with a pacemaker check. You will receive a reminder letter in the mail two months in advance. If you don't receive a letter, please call our office to schedule the follow-up appointment.  If you need a refill on your cardiac medications before your next appointment, please call your pharmacy.

## 2017-01-04 ENCOUNTER — Telehealth: Payer: Self-pay

## 2017-01-04 DIAGNOSIS — Z01812 Encounter for preprocedural laboratory examination: Secondary | ICD-10-CM

## 2017-01-04 DIAGNOSIS — I712 Thoracic aortic aneurysm, without rupture: Secondary | ICD-10-CM

## 2017-01-04 DIAGNOSIS — I714 Abdominal aortic aneurysm, without rupture, unspecified: Secondary | ICD-10-CM

## 2017-01-04 DIAGNOSIS — I7121 Aneurysm of the ascending aorta, without rupture: Secondary | ICD-10-CM

## 2017-01-04 NOTE — Telephone Encounter (Signed)
lmtcb

## 2017-01-04 NOTE — Telephone Encounter (Signed)
-----   Message from Sanda Klein, MD sent at 12/30/2016  3:50 PM EDT ----- Please let him know it is time to reevaluate his thoracic and abdominal aortic aneurysms (not measured since 2015). Needs CT Angio of thoracic and abdominal aorta with contrast, not urgent. MCr

## 2017-01-09 NOTE — Telephone Encounter (Signed)
Follow up   Pt is returning call    

## 2017-01-10 ENCOUNTER — Other Ambulatory Visit: Payer: Self-pay | Admitting: Cardiovascular Disease

## 2017-01-10 NOTE — Telephone Encounter (Signed)
Returned call to patient. Again, spoke with wife. Questions answered to wife's satisfaction. Advised her that if Mr Ake had any further questions, he could certainly call back. If he does return call, please forward phone call to me directly.   Advised Mrs Bredeson that a scheduler would contact them with an appointment date and time. Wife verbalized understanding and agreed with plan.

## 2017-01-10 NOTE — Telephone Encounter (Signed)
Returned call to patient. Spoke with wife, Juliann Pulse (okay per DPR).  Reviewed recommendations. Notified her that a scheduler would contact them for scheduling. Advised her to have patient call if he had any questions. Wife verbalized understanding and agreed with plan.

## 2017-01-10 NOTE — Telephone Encounter (Signed)
Rx request sent to pharmacy.  

## 2017-01-10 NOTE — Telephone Encounter (Signed)
Follow Up ° ° °Pt calling back °

## 2017-01-10 NOTE — Telephone Encounter (Signed)
Pt calling back again,he says if he is not home,please leave a message.He was concerned about why you were calling.

## 2017-01-11 NOTE — Telephone Encounter (Signed)
Patient needs pre-procedure labs for his pending CT imaging studies.  Lab ordered Left detailed voice message. Advised patient to call back if he has any questions.

## 2017-01-12 ENCOUNTER — Other Ambulatory Visit: Payer: Self-pay | Admitting: Cardiovascular Disease

## 2017-01-12 ENCOUNTER — Telehealth: Payer: Self-pay | Admitting: Cardiovascular Disease

## 2017-01-12 NOTE — Telephone Encounter (Signed)
Returned phone call and spoke to the wife (per DPR) She was inquiring about when the CT would be scheduled. Will send to the admin pool for scheduling.

## 2017-01-12 NOTE — Telephone Encounter (Signed)
New message      Calling to talk to the nurse.  Pt has questions about upcoming labs and a CT test

## 2017-01-16 ENCOUNTER — Telehealth: Payer: Self-pay | Admitting: Cardiovascular Disease

## 2017-01-16 LAB — BASIC METABOLIC PANEL
BUN: 19 mg/dL (ref 7–25)
CALCIUM: 9.2 mg/dL (ref 8.6–10.3)
CO2: 31 mmol/L (ref 20–31)
CREATININE: 1.19 mg/dL (ref 0.70–1.25)
Chloride: 108 mmol/L (ref 98–110)
Glucose, Bld: 99 mg/dL (ref 65–99)
Potassium: 3.9 mmol/L (ref 3.5–5.3)
Sodium: 141 mmol/L (ref 135–146)

## 2017-01-16 NOTE — Telephone Encounter (Signed)
Pt wanted to let Rivka Safer know that he had his blood work this morning.I think she is suppose d to be scheduling him for something else.

## 2017-01-16 NOTE — Telephone Encounter (Signed)
Routed to [facility] for review

## 2017-01-18 ENCOUNTER — Ambulatory Visit (INDEPENDENT_AMBULATORY_CARE_PROVIDER_SITE_OTHER): Payer: BLUE CROSS/BLUE SHIELD | Admitting: *Deleted

## 2017-01-18 DIAGNOSIS — Z7901 Long term (current) use of anticoagulants: Secondary | ICD-10-CM

## 2017-01-18 DIAGNOSIS — Z5181 Encounter for therapeutic drug level monitoring: Secondary | ICD-10-CM | POA: Diagnosis not present

## 2017-01-18 DIAGNOSIS — I4891 Unspecified atrial fibrillation: Secondary | ICD-10-CM | POA: Diagnosis not present

## 2017-01-18 LAB — POCT INR: INR: 2.5

## 2017-01-20 ENCOUNTER — Telehealth: Payer: Self-pay | Admitting: Cardiovascular Disease

## 2017-01-20 NOTE — Telephone Encounter (Signed)
New Message     Pt would like to know if you got his lab results yet

## 2017-01-20 NOTE — Telephone Encounter (Signed)
Patient called back. Results given. He verbalized he did not have any other questions.

## 2017-01-26 ENCOUNTER — Ambulatory Visit (INDEPENDENT_AMBULATORY_CARE_PROVIDER_SITE_OTHER)
Admission: RE | Admit: 2017-01-26 | Discharge: 2017-01-26 | Disposition: A | Discharge status: Home / Self Care | Payer: BLUE CROSS/BLUE SHIELD | Source: Ambulatory Visit | Attending: Cardiovascular Disease | Admitting: Cardiovascular Disease

## 2017-01-26 DIAGNOSIS — I714 Abdominal aortic aneurysm, without rupture, unspecified: Secondary | ICD-10-CM

## 2017-01-26 DIAGNOSIS — I712 Thoracic aortic aneurysm, without rupture: Secondary | ICD-10-CM

## 2017-01-26 DIAGNOSIS — I7121 Aneurysm of the ascending aorta, without rupture: Secondary | ICD-10-CM

## 2017-01-26 MED ORDER — IOPAMIDOL (ISOVUE-370) INJECTION 76%
100.0000 mL | Freq: Once | INTRAVENOUS | Status: AC | PRN
  Administered 2017-01-26: 100 mL via INTRAVENOUS

## 2017-01-27 NOTE — Telephone Encounter (Signed)
[name], RN - 01/20/17 12:40 PM    Patient called back. Results given. He verbalized he did not have any other questions.

## 2017-01-31 ENCOUNTER — Other Ambulatory Visit: Payer: Self-pay | Admitting: Cardiovascular Disease

## 2017-01-31 NOTE — Telephone Encounter (Signed)
Rx request sent to pharmacy.  

## 2017-02-15 ENCOUNTER — Ambulatory Visit (INDEPENDENT_AMBULATORY_CARE_PROVIDER_SITE_OTHER): Payer: BLUE CROSS/BLUE SHIELD | Admitting: *Deleted

## 2017-02-15 DIAGNOSIS — Z5181 Encounter for therapeutic drug level monitoring: Secondary | ICD-10-CM

## 2017-02-15 DIAGNOSIS — I4891 Unspecified atrial fibrillation: Secondary | ICD-10-CM

## 2017-02-15 DIAGNOSIS — Z7901 Long term (current) use of anticoagulants: Secondary | ICD-10-CM

## 2017-02-15 LAB — POCT INR: INR: 2.7

## 2017-03-29 ENCOUNTER — Encounter: Payer: BLUE CROSS/BLUE SHIELD | Admitting: *Deleted

## 2017-03-29 ENCOUNTER — Ambulatory Visit (INDEPENDENT_AMBULATORY_CARE_PROVIDER_SITE_OTHER): Payer: BLUE CROSS/BLUE SHIELD | Admitting: *Deleted

## 2017-03-29 DIAGNOSIS — Z5181 Encounter for therapeutic drug level monitoring: Secondary | ICD-10-CM

## 2017-03-29 DIAGNOSIS — I4891 Unspecified atrial fibrillation: Secondary | ICD-10-CM

## 2017-03-29 DIAGNOSIS — Z7901 Long term (current) use of anticoagulants: Secondary | ICD-10-CM

## 2017-03-29 LAB — POCT INR: INR: 3.5

## 2017-03-31 ENCOUNTER — Encounter: Payer: Self-pay | Admitting: Cardiology

## 2017-04-07 ENCOUNTER — Ambulatory Visit (INDEPENDENT_AMBULATORY_CARE_PROVIDER_SITE_OTHER): Payer: BLUE CROSS/BLUE SHIELD | Admitting: *Deleted

## 2017-04-07 DIAGNOSIS — I4891 Unspecified atrial fibrillation: Secondary | ICD-10-CM | POA: Diagnosis not present

## 2017-04-07 NOTE — Progress Notes (Signed)
Remote pacemaker transmission.   

## 2017-04-11 ENCOUNTER — Encounter: Payer: Self-pay | Admitting: Cardiology

## 2017-04-20 ENCOUNTER — Encounter (INDEPENDENT_AMBULATORY_CARE_PROVIDER_SITE_OTHER): Payer: Self-pay | Admitting: Internal Medicine

## 2017-04-20 ENCOUNTER — Encounter (INDEPENDENT_AMBULATORY_CARE_PROVIDER_SITE_OTHER): Payer: Self-pay

## 2017-04-24 LAB — CUP PACEART REMOTE DEVICE CHECK
Brady Statistic AP VP Percent: 0.19 %
Brady Statistic AP VS Percent: 44.89 %
Brady Statistic AS VP Percent: 0.14 %
Brady Statistic AS VS Percent: 54.77 %
Brady Statistic RA Percent Paced: 43.91 %
Implantable Lead Implant Date: 20120820
Implantable Lead Implant Date: 20120820
Implantable Lead Location: 753859
Implantable Lead Location: 753860
Lead Channel Impedance Value: 416 Ohm
Lead Channel Impedance Value: 448 Ohm
Lead Channel Sensing Intrinsic Amplitude: 4.84 mV
Lead Channel Sensing Intrinsic Amplitude: 9.54 mV
Lead Channel Setting Pacing Amplitude: 2.5 V
Lead Channel Setting Pacing Pulse Width: 0.6 ms
Lead Channel Setting Sensing Sensitivity: 0.9 mV
MDC IDC MSMT BATTERY VOLTAGE: 2.96 V
MDC IDC PG IMPLANT DT: 20120820
MDC IDC SESS DTM: 20180629103303
MDC IDC SET LEADCHNL RA PACING AMPLITUDE: 2 V
MDC IDC STAT BRADY RV PERCENT PACED: 0.33 %

## 2017-04-26 ENCOUNTER — Ambulatory Visit (INDEPENDENT_AMBULATORY_CARE_PROVIDER_SITE_OTHER): Payer: BLUE CROSS/BLUE SHIELD | Admitting: *Deleted

## 2017-04-26 DIAGNOSIS — Z7901 Long term (current) use of anticoagulants: Secondary | ICD-10-CM

## 2017-04-26 DIAGNOSIS — I4891 Unspecified atrial fibrillation: Secondary | ICD-10-CM

## 2017-04-26 DIAGNOSIS — Z5181 Encounter for therapeutic drug level monitoring: Secondary | ICD-10-CM | POA: Diagnosis not present

## 2017-04-26 LAB — POCT INR: INR: 4

## 2017-05-08 ENCOUNTER — Telehealth: Payer: Self-pay | Admitting: Cardiovascular Disease

## 2017-05-08 MED ORDER — IRBESARTAN 300 MG PO TABS: 300.0000 mg | ORAL_TABLET | Freq: Every day | ORAL | 1 refills | Status: DC

## 2017-05-08 MED ORDER — SILDENAFIL CITRATE 20 MG PO TABS: 20.0000 mg | ORAL_TABLET | Freq: Every day | ORAL | 3 refills | Status: DC | PRN

## 2017-05-08 MED ORDER — WARFARIN SODIUM 5 MG PO TABS: ORAL_TABLET | ORAL | 0 refills | Status: DC

## 2017-05-08 MED ORDER — IRBESARTAN 300 MG PO TABS: 300.0000 mg | ORAL_TABLET | Freq: Every day | ORAL | 4 refills | Status: DC

## 2017-05-08 MED ORDER — CARVEDILOL 25 MG PO TABS: 25.0000 mg | ORAL_TABLET | Freq: Two times a day (BID) | ORAL | 1 refills | Status: DC

## 2017-05-08 MED ORDER — SIMVASTATIN 40 MG PO TABS: 40.0000 mg | ORAL_TABLET | Freq: Every day | ORAL | 1 refills | Status: DC

## 2017-05-08 NOTE — Telephone Encounter (Signed)
SPOKE TO PHARMACY - CHANGE TO  IRBESARTAN 300 MG  DAILY -- #30 TABLETS WITH 4 REFILLS  UN ABLE TO LEAVE MESSAGE FOR PATIENT - PHONE WENT TO FAX

## 2017-05-08 NOTE — Telephone Encounter (Signed)
New message       Pt c/o medication issue:  1. Name of Medication: valsartan 2. How are you currently taking this medication (dosage and times per day)? 320 mg daily 3. Are you having a reaction (difficulty breathing--STAT)?  no 4. What is your medication issue? Pt brought presc in for a refill but medication has been recalled.

## 2017-05-08 NOTE — Telephone Encounter (Signed)
Okay to refill sildenafil, #10, refills 3

## 2017-05-08 NOTE — Telephone Encounter (Signed)
Sildenafil refilled

## 2017-05-08 NOTE — Telephone Encounter (Signed)
New Message ° ° pt verbalized that he is returning call for rn  °

## 2017-05-08 NOTE — Telephone Encounter (Signed)
Spoke with patient regarding BP med changes. He agreed w/plan & voiced understanding of instructions to monitor home BP every 2-3 days for about 2 weeks.   Patient requested 90 day supply of meds - carvedilol, simvastatin, irbesartan, warfarin. Rx(s) sent to pharmacy electronically.  Patient requested sildenafil refill - deferred to MD

## 2017-05-11 ENCOUNTER — Ambulatory Visit (INDEPENDENT_AMBULATORY_CARE_PROVIDER_SITE_OTHER): Payer: BLUE CROSS/BLUE SHIELD | Admitting: Internal Medicine

## 2017-05-15 ENCOUNTER — Ambulatory Visit (INDEPENDENT_AMBULATORY_CARE_PROVIDER_SITE_OTHER): Payer: PPO | Admitting: *Deleted

## 2017-05-15 ENCOUNTER — Ambulatory Visit (INDEPENDENT_AMBULATORY_CARE_PROVIDER_SITE_OTHER): Payer: PPO | Admitting: Internal Medicine

## 2017-05-15 ENCOUNTER — Telehealth: Payer: Self-pay | Admitting: Cardiovascular Disease

## 2017-05-15 ENCOUNTER — Other Ambulatory Visit (INDEPENDENT_AMBULATORY_CARE_PROVIDER_SITE_OTHER): Payer: Self-pay | Admitting: Internal Medicine

## 2017-05-15 ENCOUNTER — Encounter (INDEPENDENT_AMBULATORY_CARE_PROVIDER_SITE_OTHER): Payer: Self-pay | Admitting: Internal Medicine

## 2017-05-15 ENCOUNTER — Encounter (INDEPENDENT_AMBULATORY_CARE_PROVIDER_SITE_OTHER): Payer: Self-pay | Admitting: *Deleted

## 2017-05-15 ENCOUNTER — Ambulatory Visit (INDEPENDENT_AMBULATORY_CARE_PROVIDER_SITE_OTHER): Payer: BLUE CROSS/BLUE SHIELD | Admitting: Internal Medicine

## 2017-05-15 ENCOUNTER — Telehealth (INDEPENDENT_AMBULATORY_CARE_PROVIDER_SITE_OTHER): Payer: Self-pay | Admitting: *Deleted

## 2017-05-15 ENCOUNTER — Encounter: Payer: Self-pay | Admitting: Cardiovascular Disease

## 2017-05-15 VITALS — BP 140/84 | HR 72 | Temp 98.1°F | Ht 72.0 in | Wt 193.5 lb

## 2017-05-15 DIAGNOSIS — Z5181 Encounter for therapeutic drug level monitoring: Secondary | ICD-10-CM

## 2017-05-15 DIAGNOSIS — Z7901 Long term (current) use of anticoagulants: Secondary | ICD-10-CM | POA: Diagnosis not present

## 2017-05-15 DIAGNOSIS — I4891 Unspecified atrial fibrillation: Secondary | ICD-10-CM | POA: Diagnosis not present

## 2017-05-15 DIAGNOSIS — R195 Other fecal abnormalities: Secondary | ICD-10-CM | POA: Insufficient documentation

## 2017-05-15 LAB — POCT INR: INR: 2.5

## 2017-05-15 MED ORDER — PEG 3350-KCL-NA BICARB-NACL 420 G PO SOLR: 4000.0000 mL | Freq: Once | ORAL | 0 refills | Status: AC

## 2017-05-15 NOTE — Telephone Encounter (Signed)
Patient needs trilyte 

## 2017-05-15 NOTE — Telephone Encounter (Signed)
Clearance routed via EPIC to Dr. Laural Golden (in-basket)

## 2017-05-15 NOTE — Patient Instructions (Signed)
Colonoscopy. The risks of bleeding, perforation and infection were reviewed with patient.  

## 2017-05-15 NOTE — Telephone Encounter (Signed)
epicd 

## 2017-05-15 NOTE — Telephone Encounter (Signed)
New message        [facility] Pre-operative Risk Assessment    Request for surgical clearance:  1. What type of surgery is being performed?  colonoscopy  2. When is this surgery scheduled? Aug 29th   3. Are there any medications that need to be held prior to surgery and how long? Coumadin 5 days before   4. Name of physician performing surgery? Rehman  5. What is your office phone and fax number? [phone] fax [phone]   Howie Ill 05/15/2017, 2:44 PM  _________________________________________________________________   (provider comments below)

## 2017-05-15 NOTE — Progress Notes (Signed)
Subjective:    Patient ID: Alex Maddox, male    DOB: 09-26-1951, 66 y.o.   MRN: [id number]  HPI Referred by Dr. Gerarda Fraction for positive stool card. He states his last colonoscopy was 10 yrs ago by Dr.Jenkins and he reports it was normal.  Patient denies seeing any blood. No change in his stools. Appetite is good.  Some weight loss (10-12 pounds). Has been out in heat working and has stopped eating sweats.  Has a BM every other day or every two days.  No family hx colon cancer.  Hx of CVA and atrial fib and maintained on Coumadin.  Hx of pacemaker     Review of Systems Past Medical History:  Diagnosis Date  . Abnormal echocardiogram    stress myoview 10/29/2010  . Arthritis    " IN MY BACK "  . Dyslipidemia   . Hypertension   . Paroxysmal atrial fibrillation (HCC)   . Presence of permanent cardiac pacemaker   . Sinus node dysfunction (HCC)   . Sleep apnea, obstructive    sleep study 10/09/2007  AHI 5.73/hr  REM AHI 11.90/hr   . 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   LEFT THUMB   . Stroke ([place])   . Syncope    2D Echocardiogram 09/27/2010 EF between 40 to 45%  . Tachycardia-bradycardia ([place])   . Transient ischemic attack (TIA) 06/07    Past Surgical History:  Procedure Laterality Date  . ELECTROCARDIOGRAM    . INSERT / REPLACE / REMOVE PACEMAKER  05/2011  . LOOP RECORDER IMPLANT  02/02/2011   Medtronic- CRM   . PACEMAKER INSERTION  05/30/2011   Medtronic Revo   last checked 11/07/2012    No Known Allergies  Current Outpatient Prescriptions on File Prior to Visit  Medication Sig Dispense Refill  . carvedilol (COREG) 25 MG tablet Take 1 tablet (25 mg total) by mouth 2 (two) times daily with a meal. 180 tablet 1  . fish oil-omega-3 fatty acids 1000 MG capsule Take 1 g by mouth daily.    . irbesartan (AVAPRO) 300 MG tablet Take 1 tablet (300 mg total) by mouth daily. 90 tablet 1  . Multiple Vitamin (MULTIVITAMIN) tablet Take 1 tablet by mouth daily.    . saw  palmetto 160 MG capsule Take 160 mg by mouth daily.    . sildenafil (REVATIO) 20 MG tablet Take 1 tablet (20 mg total) by mouth daily as needed. 10 tablet 3  . simvastatin (ZOCOR) 40 MG tablet Take 1 tablet (40 mg total) by mouth daily at 6 PM. 90 tablet 1  . warfarin (COUMADIN) 5 MG tablet Take one to one and one-half tablets by mouth daily as directed by coumadin clinic 135 tablet 0   No current facility-administered medications on file prior to visit.         Objective:   Physical Exam Blood pressure 140/84, pulse 72, temperature 98.1 F (36.7 C), height 6' (1.829 m), weight 193 lb 8 oz (87.8 kg). Alert and oriented. Skin warm and dry. Oral mucosa is moist.   . Sclera anicteric, conjunctivae is pink. Thyroid not enlarged. No cervical lymphadenopathy. Lungs clear. Heart regular rate and rhythm.  Abdomen is soft. Bowel sounds are positive. No hepatomegaly. No abdominal masses felt. No tenderness.  No edema to lower extremities.  and           Assessment & Plan:  Guaiac positive stools.  Colonic neoplasm needs to be ruled out. AVM, hemorrhoids  also needs to be ruled out.

## 2017-05-26 ENCOUNTER — Encounter (INDEPENDENT_AMBULATORY_CARE_PROVIDER_SITE_OTHER): Payer: Self-pay | Admitting: *Deleted

## 2017-06-07 ENCOUNTER — Encounter (HOSPITAL_COMMUNITY): Payer: Self-pay | Admitting: *Deleted

## 2017-06-07 ENCOUNTER — Ambulatory Visit (HOSPITAL_COMMUNITY)
Admission: RE | Admit: 2017-06-07 | Discharge: 2017-06-07 | Disposition: A | Discharge status: Home / Self Care | Payer: PPO | Source: Ambulatory Visit | Attending: Internal Medicine | Admitting: Internal Medicine

## 2017-06-07 ENCOUNTER — Encounter (HOSPITAL_COMMUNITY)
Admission: RE | Disposition: A | Discharge status: Home / Self Care | Payer: Self-pay | Source: Ambulatory Visit | Attending: Internal Medicine

## 2017-06-07 DIAGNOSIS — E785 Hyperlipidemia, unspecified: Secondary | ICD-10-CM | POA: Diagnosis not present

## 2017-06-07 DIAGNOSIS — Z950 Presence of cardiac pacemaker: Secondary | ICD-10-CM | POA: Insufficient documentation

## 2017-06-07 DIAGNOSIS — Z8673 Personal history of transient ischemic attack (TIA), and cerebral infarction without residual deficits: Secondary | ICD-10-CM | POA: Insufficient documentation

## 2017-06-07 DIAGNOSIS — I480 Paroxysmal atrial fibrillation: Secondary | ICD-10-CM | POA: Insufficient documentation

## 2017-06-07 DIAGNOSIS — D124 Benign neoplasm of descending colon: Secondary | ICD-10-CM | POA: Diagnosis not present

## 2017-06-07 DIAGNOSIS — I10 Essential (primary) hypertension: Secondary | ICD-10-CM | POA: Insufficient documentation

## 2017-06-07 DIAGNOSIS — G4733 Obstructive sleep apnea (adult) (pediatric): Secondary | ICD-10-CM | POA: Diagnosis not present

## 2017-06-07 DIAGNOSIS — Z7901 Long term (current) use of anticoagulants: Secondary | ICD-10-CM | POA: Diagnosis not present

## 2017-06-07 DIAGNOSIS — Z79899 Other long term (current) drug therapy: Secondary | ICD-10-CM | POA: Insufficient documentation

## 2017-06-07 DIAGNOSIS — D123 Benign neoplasm of transverse colon: Secondary | ICD-10-CM | POA: Insufficient documentation

## 2017-06-07 DIAGNOSIS — D122 Benign neoplasm of ascending colon: Secondary | ICD-10-CM | POA: Diagnosis not present

## 2017-06-07 DIAGNOSIS — R195 Other fecal abnormalities: Secondary | ICD-10-CM | POA: Insufficient documentation

## 2017-06-07 DIAGNOSIS — K648 Other hemorrhoids: Secondary | ICD-10-CM | POA: Insufficient documentation

## 2017-06-07 HISTORY — DX: Cardiac arrhythmia, unspecified: I49.9

## 2017-06-07 HISTORY — PX: COLONOSCOPY: SHX5424

## 2017-06-07 LAB — HEMOGLOBIN AND HEMATOCRIT, BLOOD
HEMATOCRIT: 41.9 % (ref 39.0–52.0)
HEMOGLOBIN: 14.1 g/dL (ref 13.0–17.0)

## 2017-06-07 SURGERY — COLONOSCOPY
Anesthesia: Moderate Sedation

## 2017-06-07 MED ORDER — MIDAZOLAM HCL 5 MG/5ML IJ SOLN
INTRAMUSCULAR | Status: DC | PRN
  Administered 2017-06-07: 1 mg via INTRAVENOUS
  Administered 2017-06-07 (×2): 2 mg via INTRAVENOUS

## 2017-06-07 MED ORDER — SODIUM CHLORIDE 0.9% FLUSH
INTRAVENOUS | Status: AC
  Filled 2017-06-07: qty 10

## 2017-06-07 MED ORDER — SODIUM CHLORIDE 0.9% FLUSH
INTRAVENOUS | Status: DC | PRN
  Administered 2017-06-07: 4 mL via INTRAVENOUS

## 2017-06-07 MED ORDER — SODIUM CHLORIDE 0.9 % IV SOLN
INTRAVENOUS | Status: DC
  Administered 2017-06-07: 09:00:00 via INTRAVENOUS

## 2017-06-07 MED ORDER — MEPERIDINE HCL 50 MG/ML IJ SOLN
INTRAMUSCULAR | Status: DC
Start: 2017-06-07 — End: 2017-06-07
  Filled 2017-06-07: qty 1

## 2017-06-07 MED ORDER — SIMETHICONE 40 MG/0.6ML PO SUSP
ORAL | Status: DC | PRN
  Administered 2017-06-07: 2.5 mL

## 2017-06-07 MED ORDER — MEPERIDINE HCL 50 MG/ML IJ SOLN
INTRAMUSCULAR | Status: DC | PRN
  Administered 2017-06-07 (×2): 25 mg via INTRAVENOUS

## 2017-06-07 MED ORDER — MIDAZOLAM HCL 5 MG/5ML IJ SOLN
INTRAMUSCULAR | Status: AC
  Filled 2017-06-07: qty 10

## 2017-06-07 NOTE — Op Note (Signed)
[facility] Patient Name: Alex Maddox Procedure Date: 06/07/2017 9:10 AM MRN: [id number] Date of Birth: 07/12/1951 Attending MD: Hildred Laser , MD CSN: [id number] Age: 66 Admit Type: Outpatient Procedure:                Colonoscopy Indications:              Heme positive stool Providers:                Hildred Laser, MD, Lurline Del, RN, Rosina Lowenstein, RN Referring MD:             Redmond School, MD Medicines:                Meperidine 50 mg IV, Midazolam 5 mg IV Complications:            No immediate complications. Estimated Blood Loss:     Estimated blood loss was minimal. Procedure:                Pre-Anesthesia Assessment:                           - Prior to the procedure, a History and Physical                            was performed, and patient medications and                            allergies were reviewed. The patient's tolerance of                            previous anesthesia was also reviewed. The risks                            and benefits of the procedure and the sedation                            options and risks were discussed with the patient.                            All questions were answered, and informed consent                            was obtained. Prior Anticoagulants: The patient                            last took Coumadin (warfarin) 5 days prior to the                            procedure. ASA Grade Assessment: III - A patient                            with severe systemic disease. After reviewing the                            risks and benefits, the patient was deemed in  satisfactory condition to undergo the procedure.                           After obtaining informed consent, the colonoscope                            was passed under direct vision. Throughout the                            procedure, the patient's blood pressure, pulse, and                            oxygen saturations were monitored  continuously. The                            PCF-H190L([id number]) scope was introduced through the                            anus and advanced to the the terminal ileum, with                            identification of the appendiceal orifice and IC                            valve. The colonoscopy was performed without                            difficulty. The patient tolerated the procedure                            well. The quality of the bowel preparation was                            inadequate. The terminal ileum, ileocecal valve,                            appendiceal orifice, and rectum were photographed. Scope In: 9:37:53 AM Scope Out: 10:21:43 AM Scope Withdrawal Time: 0 hours 31 minutes 7 seconds  Total Procedure Duration: 0 hours 43 minutes 50 seconds  Findings:      The perianal and digital rectal examinations were normal.      A 5 mm polyp was found in the ascending colon. The polyp was       semi-sessile. The polyp was removed with a cold snare. Resection and       retrieval were complete. The pathology specimen was placed into Bottle       Number 1.      Two sessile polyps were found in the splenic flexure. The polyps were       small in size. These were biopsied with a cold forceps for histology.       The pathology specimen was placed into Bottle Number 1.      A 10 mm polyp was found in the descending colon. The polyp was flat.       Area was successfully injected with 4 mL saline for a lift polypectomy.  The pathology specimen was placed into Bottle Number 2. The pathology       specimen was placed into Bottle Number 2.      The exam was otherwise normal throughout the examined colon.      Internal hemorrhoids were found during retroflexion. The hemorrhoids       were small. Impression:               - Preparation of the colon was inadequate.                           - One 5 mm polyp in the ascending colon, removed                            with a cold  snare. Resected and retrieved.                           - Two small polyps at the splenic flexure. Biopsied.                           - One 10 mm polyp in the descending colon. Injected.                           - Internal hemorrhoids. Moderate Sedation:      Moderate (conscious) sedation was administered by the endoscopy nurse       and supervised by the endoscopist. The following parameters were       monitored: oxygen saturation, heart rate, blood pressure, CO2       capnography and response to care. Total physician intraservice time was       48 minutes. Recommendation:           - Patient has a contact number available for                            emergencies. The signs and symptoms of potential                            delayed complications were discussed with the                            patient. Return to normal activities tomorrow.                            Written discharge instructions were provided to the                            patient.                           - Resume previous diet today.                           - Continue present medications.                           - Resume Coumadin (warfarin) at prior dose  tomorrow. Refer to Coumadin Clinic for further                            adjustment of therapy.                           - Await pathology results.                           - Will check H&H today.                           - Repeat colonoscopy in 1 year for surveillance(                            prep suboptimal). Procedure Code(s):        --- Professional ---                           [phone], Colonoscopy, flexible; with removal of                            tumor(s), polyp(s), or other lesion(s) by snare                            technique                           45380, 59, Colonoscopy, flexible; with biopsy,                            single or multiple                           45381, Colonoscopy, flexible; with  directed                            submucosal injection(s), any substance                           99152, Moderate sedation services provided by the                            same physician or other qualified health care                            professional performing the diagnostic or                            therapeutic service that the sedation supports,                            requiring the presence of an independent trained                            observer to assist in the monitoring of the  patient's level of consciousness and physiological                            status; initial 15 minutes of intraservice time,                            patient age 25 years or older                           [phone], Moderate sedation services; each additional                            15 minutes intraservice time                           [phone], Moderate sedation services; each additional                            15 minutes intraservice time Diagnosis Code(s):        --- Professional ---                           K64.8, Other hemorrhoids                           D12.2, Benign neoplasm of ascending colon                           D12.4, Benign neoplasm of descending colon                           D12.3, Benign neoplasm of transverse colon (hepatic                            flexure or splenic flexure)                           R19.5, Other fecal abnormalities CPT copyright 2016 American Medical Association. All rights reserved. The codes documented in this report are preliminary and upon coder review may  be revised to meet current compliance requirements. Hildred Laser, MD Hildred Laser, MD 06/07/2017 10:33:44 AM This report has been signed electronically. Number of Addenda: 0

## 2017-06-07 NOTE — Discharge Instructions (Signed)
Resume warfarin on 06/08/2017. INR check end of next week. No aspirin or NSAIDs for 1 week. Resume other medications and diet as before. No driving for 24 hours. Physician will call with biopsy results.   Colonoscopy, Adult, Care After This sheet gives you information about how to care for yourself after your procedure. Your health care provider may also give you more specific instructions. If you have problems or questions, contact your health care provider. What can I expect after the procedure? After the procedure, it is common to have:  A small amount of blood in your stool for 24 hours after the procedure.  Some gas.  Mild abdominal cramping or bloating.  Follow these instructions at home: General instructions   For the first 24 hours after the procedure: ? Do not drive or use machinery. ? Do not sign important documents. ? Do not drink alcohol. ? Do your regular daily activities at a slower pace than normal. ? Eat soft, easy-to-digest foods. ? Rest often.  Take over-the-counter or prescription medicines only as told by your health care provider.  It is up to you to get the results of your procedure. Ask your health care provider, or the department performing the procedure, when your results will be ready. Relieving cramping and bloating  Try walking around when you have cramps or feel bloated.  Apply heat to your abdomen as told by your health care provider. Use a heat source that your health care provider recommends, such as a moist heat pack or a heating pad. ? Place a towel between your skin and the heat source. ? Leave the heat on for 20-30 minutes. ? Remove the heat if your skin turns bright red. This is especially important if you are unable to feel pain, heat, or cold. You may have a greater risk of getting burned. Eating and drinking  Drink enough fluid to keep your urine clear or pale yellow.  Resume your normal diet as instructed by your health care  provider. Avoid heavy or fried foods that are hard to digest.  Avoid drinking alcohol for as long as instructed by your health care provider. Contact a health care provider if:  You have blood in your stool 2-3 days after the procedure. Get help right away if:  You have more than a small spotting of blood in your stool.  You pass large blood clots in your stool.  Your abdomen is swollen.  You have nausea or vomiting.  You have a fever.  You have increasing abdominal pain that is not relieved with medicine. This information is not intended to replace advice given to you by your health care provider. Make sure you discuss any questions you have with your health care provider. Document Released: 05/10/2004 Document Revised: 06/20/2016 Document Reviewed: 12/08/2015 Elsevier Interactive Patient Education  2018 [employer].   Colon Polyps Polyps are tissue growths inside the body. Polyps can grow in many places, including the large intestine (colon). A polyp may be a round bump or a mushroom-shaped growth. You could have one polyp or several. Most colon polyps are noncancerous (benign). However, some colon polyps can become cancerous over time. What are the causes? The exact cause of colon polyps is not known. What increases the risk? This condition is more likely to develop in people who:  Have a family history of colon cancer or colon polyps.  Are older than 36 or older than 45 if they are African American.  Have inflammatory bowel disease, such  as ulcerative colitis or Crohn disease.  Are overweight.  Smoke cigarettes.  Do not get enough exercise.  Drink too much alcohol.  Eat a diet that is: ? High in fat and red meat. ? Low in fiber.  Had childhood cancer that was treated with abdominal radiation.  What are the signs or symptoms? Most polyps do not cause symptoms. If you have symptoms, they may include:  Blood coming from your rectum when having a bowel  movement.  Blood in your stool.The stool may look dark red or black.  A change in bowel habits, such as constipation or diarrhea.  How is this diagnosed? This condition is diagnosed with a colonoscopy. This is a procedure that uses a lighted, flexible scope to look at the inside of your colon. How is this treated? Treatment for this condition involves removing any polyps that are found. Those polyps will then be tested for cancer. If cancer is found, your health care provider will talk to you about options for colon cancer treatment. Follow these instructions at home: Diet  Eat plenty of fiber, such as fruits, vegetables, and whole grains.  Eat foods that are high in calcium and vitamin D, such as milk, cheese, yogurt, eggs, liver, fish, and broccoli.  Limit foods high in fat, red meats, and processed meats, such as hot dogs, sausage, bacon, and lunch meats.  Maintain a healthy weight, or lose weight if recommended by your health care provider. General instructions  Do not smoke cigarettes.  Do not drink alcohol excessively.  Keep all follow-up visits as told by your health care provider. This is important. This includes keeping regularly scheduled colonoscopies. Talk to your health care provider about when you need a colonoscopy.  Exercise every day or as told by your health care provider. Contact a health care provider if:  You have new or worsening bleeding during a bowel movement.  You have new or increased blood in your stool.  You have a change in bowel habits.  You unexpectedly lose weight. This information is not intended to replace advice given to you by your health care provider. Make sure you discuss any questions you have with your health care provider. Document Released: 06/22/2004 Document Revised: 03/03/2016 Document Reviewed: 08/17/2015 Elsevier Interactive Patient Education  [employer].

## 2017-06-07 NOTE — H&P (Signed)
Alex Maddox is an 66 y.o. male.   Chief Complaint: Patient is here for colonoscopy. HPI: patient is 66 year old Caucasian male wlast colonoscopy was normal 10 years ago who was noted to have heme-positive stool. He denies melena or rectal bleeding abdominal pain or weight loss. He is on warfarin and does not take OTC NSAIDs. Family history is negative for CRC.  Past Medical History:  Diagnosis Date  . Abnormal echocardiogram    stress myoview 10/29/2010  . Arthritis    " IN MY BACK "  . Dyslipidemia   . Dysrhythmia    Atrial fibrillation  . Hypertension   . Paroxysmal atrial fibrillation (HCC)   . Presence of permanent cardiac pacemaker   . Sinus node dysfunction (HCC)   . Sleep apnea, obstructive    sleep study 10/09/2007  AHI 5.73/hr  REM AHI 11.90/hr   . 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   LEFT THUMB   . Stroke ([place])   . Syncope    2D Echocardiogram 09/27/2010 EF between 40 to 45%  . Tachycardia-bradycardia ([place])   . Transient ischemic attack (TIA) 06/07    Past Surgical History:  Procedure Laterality Date  . COLONOSCOPY    . ELECTROCARDIOGRAM    . INSERT / REPLACE / REMOVE PACEMAKER  05/2011  . LOOP RECORDER IMPLANT  02/02/2011   Medtronic- CRM   . PACEMAKER INSERTION  05/30/2011   Medtronic Revo   last checked 11/07/2012    Family History  Problem Relation Age of Onset  . Hypertension Mother   . Heart attack Father 55       deceased from heart attack  . Hypertension Brother   . Colon cancer Neg Hx    Social History:  reports that he has never smoked. He has never used smokeless tobacco. He reports that he drinks alcohol. He reports that he does not use drugs.  Allergies: No Known Allergies  Medications Prior to Admission  Medication Sig Dispense Refill  . carvedilol (COREG) 25 MG tablet Take 1 tablet (25 mg total) by mouth 2 (two) times daily with a meal. 180 tablet 1  . diazepam (VALIUM) 2 MG tablet Take 2 mg by mouth every 6 (six) hours as  needed for anxiety.    . fish oil-omega-3 fatty acids 1000 MG capsule Take 1 g by mouth daily.    . irbesartan (AVAPRO) 300 MG tablet Take 1 tablet (300 mg total) by mouth daily. 90 tablet 1  . Misc Natural Products (PROSTATE HEALTH PO) Take 2 capsules by mouth every morning.    . Multiple Vitamin (MULTIVITAMIN) tablet Take 1 tablet by mouth daily.    . sildenafil (REVATIO) 20 MG tablet Take 1 tablet (20 mg total) by mouth daily as needed. 10 tablet 3  . simvastatin (ZOCOR) 40 MG tablet Take 1 tablet (40 mg total) by mouth daily at 6 PM. 90 tablet 1  . warfarin (COUMADIN) 5 MG tablet Take one to one and one-half tablets by mouth daily as directed by coumadin clinic 135 tablet 0    No results found for this or any previous visit (from the past 48 hour(s)). No results found.  ROS  Blood pressure (!) 162/101, pulse 66, temperature 97.7 F (36.5 C), temperature source Oral, resp. rate 12, height 6' (1.829 m), weight 185 lb (83.9 kg), SpO2 99 %. Physical Exam  Constitutional: He appears well-developed and well-nourished.  HENT:  Mouth/Throat: Oropharynx is clear and moist.  Eyes: Conjunctivae are normal.  No scleral icterus.  Neck: No thyromegaly present.  Cardiovascular: Normal rate, regular rhythm and normal heart sounds.   No murmur heard. Respiratory: Effort normal and breath sounds normal.  pacemaker in right pectoral region.  GI: Soft. He exhibits no distension and no mass. There is no tenderness.  Musculoskeletal: He exhibits no edema.  Lymphadenopathy:    He has no cervical adenopathy.  Skin: Skin is warm and dry.     Assessment/Plan Heme positive stool. Diagnostic colonoscopy.  Hildred Laser, MD 06/07/2017, 9:28 AM

## 2017-06-09 ENCOUNTER — Encounter (HOSPITAL_COMMUNITY): Payer: Self-pay | Admitting: Internal Medicine

## 2017-06-14 ENCOUNTER — Ambulatory Visit (INDEPENDENT_AMBULATORY_CARE_PROVIDER_SITE_OTHER): Payer: PPO | Admitting: *Deleted

## 2017-06-14 DIAGNOSIS — Z5181 Encounter for therapeutic drug level monitoring: Secondary | ICD-10-CM

## 2017-06-14 DIAGNOSIS — I4891 Unspecified atrial fibrillation: Secondary | ICD-10-CM

## 2017-06-14 DIAGNOSIS — Z7901 Long term (current) use of anticoagulants: Secondary | ICD-10-CM

## 2017-06-14 LAB — POCT INR: INR: 1.9

## 2017-07-10 ENCOUNTER — Telehealth: Payer: Self-pay | Admitting: Cardiology

## 2017-07-10 ENCOUNTER — Ambulatory Visit (INDEPENDENT_AMBULATORY_CARE_PROVIDER_SITE_OTHER): Payer: PPO | Admitting: *Deleted

## 2017-07-10 DIAGNOSIS — I4891 Unspecified atrial fibrillation: Secondary | ICD-10-CM

## 2017-07-10 NOTE — Telephone Encounter (Signed)
LMOVM reminding pt to send remote transmission.   

## 2017-07-11 LAB — CUP PACEART REMOTE DEVICE CHECK
Battery Voltage: 2.96 V
Brady Statistic AS VS Percent: 52.29 %
Brady Statistic RA Percent Paced: 46.08 %
Implantable Lead Location: 753860
Lead Channel Impedance Value: 432 Ohm
Lead Channel Sensing Intrinsic Amplitude: 4.708 mV
Lead Channel Sensing Intrinsic Amplitude: 5.592 mV
Lead Channel Setting Sensing Sensitivity: 0.9 mV
MDC IDC LEAD IMPLANT DT: 20120820
MDC IDC LEAD IMPLANT DT: 20120820
MDC IDC LEAD LOCATION: 753859
MDC IDC MSMT LEADCHNL RA IMPEDANCE VALUE: 464 Ohm
MDC IDC PG IMPLANT DT: 20120820
MDC IDC SESS DTM: 20181002123634
MDC IDC SET LEADCHNL RA PACING AMPLITUDE: 2 V
MDC IDC SET LEADCHNL RV PACING AMPLITUDE: 2.5 V
MDC IDC SET LEADCHNL RV PACING PULSEWIDTH: 0.6 ms
MDC IDC STAT BRADY AP VP PERCENT: 0.29 %
MDC IDC STAT BRADY AP VS PERCENT: 47.22 %
MDC IDC STAT BRADY AS VP PERCENT: 0.2 %
MDC IDC STAT BRADY RV PERCENT PACED: 0.49 %

## 2017-07-11 NOTE — Progress Notes (Signed)
Remote pacemaker transmission.   

## 2017-07-12 ENCOUNTER — Encounter: Payer: Self-pay | Admitting: Cardiology

## 2017-07-12 ENCOUNTER — Ambulatory Visit (INDEPENDENT_AMBULATORY_CARE_PROVIDER_SITE_OTHER): Payer: PPO | Admitting: *Deleted

## 2017-07-12 DIAGNOSIS — Z7901 Long term (current) use of anticoagulants: Secondary | ICD-10-CM

## 2017-07-12 DIAGNOSIS — Z5181 Encounter for therapeutic drug level monitoring: Secondary | ICD-10-CM | POA: Diagnosis not present

## 2017-07-12 DIAGNOSIS — I4891 Unspecified atrial fibrillation: Secondary | ICD-10-CM

## 2017-07-12 LAB — POCT INR: INR: 2.3

## 2017-08-09 ENCOUNTER — Ambulatory Visit (INDEPENDENT_AMBULATORY_CARE_PROVIDER_SITE_OTHER): Payer: PPO | Admitting: *Deleted

## 2017-08-09 DIAGNOSIS — Z7901 Long term (current) use of anticoagulants: Secondary | ICD-10-CM

## 2017-08-09 DIAGNOSIS — Z5181 Encounter for therapeutic drug level monitoring: Secondary | ICD-10-CM

## 2017-08-09 DIAGNOSIS — I4891 Unspecified atrial fibrillation: Secondary | ICD-10-CM | POA: Diagnosis not present

## 2017-08-09 LAB — POCT INR: INR: 1.9

## 2017-08-17 ENCOUNTER — Telehealth: Payer: Self-pay | Admitting: Cardiovascular Disease

## 2017-08-17 NOTE — Telephone Encounter (Signed)
Spoke with pt, he had been taking 4 of the 20 mg tablets and reports it is not working for his erectile dysfunction. He had some testing done by a clinic in [place] and they recommended he take 150 mg. They want to make sure that dose is okay for patient to take. Discussed with kristin, pharm md, she will review and advise.

## 2017-08-17 NOTE — Telephone Encounter (Signed)
Left message for patient, 100 mg is the highest recommended dose but a study wasdone and the fiks that took the 150 mg had significant side effects and had to stop the viagra. Will forward to dr croitoru to review and advise

## 2017-08-17 NOTE — Telephone Encounter (Signed)
New message   Patient  calling because [facility] in [facility] increased Viagra. Patient wants to know if safe to take with heart medications. Please call.  Pt c/o medication issue:  1. Name of Medication:  Viagra   2. How are you currently taking this medication (dosage and times per day)? 150 mg  3. Are you having a reaction (difficulty breathing--STAT)? no  4. What is your medication issue?  Patient wants to be sure medication safe to take.

## 2017-08-17 NOTE — Telephone Encounter (Signed)
I would worry about using high doses of sildenafil (over 100 mg) in his case particularly. He has a history of neurocardiogenic syncope and is more likely to pass out with high doses of vasodilators. Probably time to discuss other options with an Dealer. MCr

## 2017-08-17 NOTE — Telephone Encounter (Signed)
Left message for patient of dr croitoru's recommendations.

## 2017-08-29 DIAGNOSIS — I10 Essential (primary) hypertension: Secondary | ICD-10-CM | POA: Diagnosis not present

## 2017-08-29 DIAGNOSIS — N529 Male erectile dysfunction, unspecified: Secondary | ICD-10-CM | POA: Diagnosis not present

## 2017-08-29 DIAGNOSIS — Z6828 Body mass index (BMI) 28.0-28.9, adult: Secondary | ICD-10-CM | POA: Diagnosis not present

## 2017-08-29 DIAGNOSIS — E663 Overweight: Secondary | ICD-10-CM | POA: Diagnosis not present

## 2017-08-29 DIAGNOSIS — Z0001 Encounter for general adult medical examination with abnormal findings: Secondary | ICD-10-CM | POA: Diagnosis not present

## 2017-08-29 DIAGNOSIS — E782 Mixed hyperlipidemia: Secondary | ICD-10-CM | POA: Diagnosis not present

## 2017-08-30 DIAGNOSIS — R7309 Other abnormal glucose: Secondary | ICD-10-CM | POA: Diagnosis not present

## 2017-08-30 DIAGNOSIS — E748 Other specified disorders of carbohydrate metabolism: Secondary | ICD-10-CM | POA: Diagnosis not present

## 2017-09-06 ENCOUNTER — Ambulatory Visit (INDEPENDENT_AMBULATORY_CARE_PROVIDER_SITE_OTHER): Payer: PPO | Admitting: *Deleted

## 2017-09-06 DIAGNOSIS — Z5181 Encounter for therapeutic drug level monitoring: Secondary | ICD-10-CM

## 2017-09-06 DIAGNOSIS — Z7901 Long term (current) use of anticoagulants: Secondary | ICD-10-CM

## 2017-09-06 DIAGNOSIS — I4891 Unspecified atrial fibrillation: Secondary | ICD-10-CM

## 2017-09-06 LAB — POCT INR: INR: 1.7

## 2017-09-11 ENCOUNTER — Emergency Department (HOSPITAL_COMMUNITY)
Admission: EM | Admit: 2017-09-11 | Discharge: 2017-09-12 | Disposition: A | Discharge status: Home / Self Care | Payer: PPO | Attending: Emergency Medicine | Admitting: Emergency Medicine

## 2017-09-11 ENCOUNTER — Other Ambulatory Visit: Payer: Self-pay

## 2017-09-11 ENCOUNTER — Encounter (HOSPITAL_COMMUNITY): Payer: Self-pay

## 2017-09-11 DIAGNOSIS — Z950 Presence of cardiac pacemaker: Secondary | ICD-10-CM | POA: Diagnosis not present

## 2017-09-11 DIAGNOSIS — S161XXA Strain of muscle, fascia and tendon at neck level, initial encounter: Secondary | ICD-10-CM | POA: Insufficient documentation

## 2017-09-11 DIAGNOSIS — Y92410 Unspecified street and highway as the place of occurrence of the external cause: Secondary | ICD-10-CM | POA: Diagnosis not present

## 2017-09-11 DIAGNOSIS — Z8673 Personal history of transient ischemic attack (TIA), and cerebral infarction without residual deficits: Secondary | ICD-10-CM | POA: Insufficient documentation

## 2017-09-11 DIAGNOSIS — I480 Paroxysmal atrial fibrillation: Secondary | ICD-10-CM | POA: Insufficient documentation

## 2017-09-11 DIAGNOSIS — Z7901 Long term (current) use of anticoagulants: Secondary | ICD-10-CM | POA: Insufficient documentation

## 2017-09-11 DIAGNOSIS — I714 Abdominal aortic aneurysm, without rupture, unspecified: Secondary | ICD-10-CM

## 2017-09-11 DIAGNOSIS — S0990XA Unspecified injury of head, initial encounter: Secondary | ICD-10-CM | POA: Diagnosis not present

## 2017-09-11 DIAGNOSIS — Y939 Activity, unspecified: Secondary | ICD-10-CM | POA: Diagnosis not present

## 2017-09-11 DIAGNOSIS — Z79899 Other long term (current) drug therapy: Secondary | ICD-10-CM | POA: Insufficient documentation

## 2017-09-11 DIAGNOSIS — I10 Essential (primary) hypertension: Secondary | ICD-10-CM | POA: Insufficient documentation

## 2017-09-11 DIAGNOSIS — S301XXA Contusion of abdominal wall, initial encounter: Secondary | ICD-10-CM | POA: Insufficient documentation

## 2017-09-11 DIAGNOSIS — R109 Unspecified abdominal pain: Secondary | ICD-10-CM | POA: Diagnosis not present

## 2017-09-11 DIAGNOSIS — S299XXA Unspecified injury of thorax, initial encounter: Secondary | ICD-10-CM | POA: Diagnosis not present

## 2017-09-11 DIAGNOSIS — E785 Hyperlipidemia, unspecified: Secondary | ICD-10-CM | POA: Diagnosis not present

## 2017-09-11 DIAGNOSIS — Y998 Other external cause status: Secondary | ICD-10-CM | POA: Insufficient documentation

## 2017-09-11 DIAGNOSIS — S199XXA Unspecified injury of neck, initial encounter: Secondary | ICD-10-CM | POA: Diagnosis not present
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
## 2017-09-12 ENCOUNTER — Emergency Department (HOSPITAL_COMMUNITY): Payer: PPO

## 2017-09-12 DIAGNOSIS — S299XXA Unspecified injury of thorax, initial encounter: Secondary | ICD-10-CM | POA: Diagnosis not present

## 2017-09-12 DIAGNOSIS — D225 Melanocytic nevi of trunk: Secondary | ICD-10-CM | POA: Diagnosis not present

## 2017-09-12 DIAGNOSIS — L570 Actinic keratosis: Secondary | ICD-10-CM | POA: Diagnosis not present

## 2017-09-12 DIAGNOSIS — R109 Unspecified abdominal pain: Secondary | ICD-10-CM | POA: Diagnosis not present

## 2017-09-12 DIAGNOSIS — S199XXA Unspecified injury of neck, initial encounter: Secondary | ICD-10-CM | POA: Diagnosis not present

## 2017-09-12 DIAGNOSIS — D1800 Hemangioma unspecified site: Secondary | ICD-10-CM | POA: Diagnosis not present

## 2017-09-12 DIAGNOSIS — S0990XA Unspecified injury of head, initial encounter: Secondary | ICD-10-CM | POA: Diagnosis not present

## 2017-09-12 DIAGNOSIS — Z23 Encounter for immunization: Secondary | ICD-10-CM | POA: Diagnosis not present

## 2017-09-12 DIAGNOSIS — L821 Other seborrheic keratosis: Secondary | ICD-10-CM | POA: Diagnosis not present

## 2017-09-12 LAB — CBC WITH DIFFERENTIAL/PLATELET
BASOS PCT: 0 %
Basophils Absolute: 0 10*3/uL (ref 0.0–0.1)
EOS ABS: 0.2 10*3/uL (ref 0.0–0.7)
Eosinophils Relative: 2 %
HEMATOCRIT: 49 % (ref 39.0–52.0)
HEMOGLOBIN: 16.4 g/dL (ref 13.0–17.0)
Lymphocytes Relative: 26 %
Lymphs Abs: 2 10*3/uL (ref 0.7–4.0)
MCH: 31.7 pg (ref 26.0–34.0)
MCHC: 33.5 g/dL (ref 30.0–36.0)
MCV: 94.8 fL (ref 78.0–100.0)
Monocytes Absolute: 0.9 10*3/uL (ref 0.1–1.0)
Monocytes Relative: 11 %
NEUTROS ABS: 4.8 10*3/uL (ref 1.7–7.7)
NEUTROS PCT: 61 %
Platelets: 151 10*3/uL (ref 150–400)
RBC: 5.17 MIL/uL (ref 4.22–5.81)
RDW: 12.3 % (ref 11.5–15.5)
WBC: 7.8 10*3/uL (ref 4.0–10.5)

## 2017-09-12 LAB — BASIC METABOLIC PANEL
ANION GAP: 6 (ref 5–15)
BUN: 15 mg/dL (ref 6–20)
CALCIUM: 10 mg/dL (ref 8.9–10.3)
CHLORIDE: 105 mmol/L (ref 101–111)
CO2: 29 mmol/L (ref 22–32)
Creatinine, Ser: 1.03 mg/dL (ref 0.61–1.24)
Glucose, Bld: 100 mg/dL — ABNORMAL HIGH (ref 65–99)
POTASSIUM: 3.5 mmol/L (ref 3.5–5.1)
SODIUM: 140 mmol/L (ref 135–145)

## 2017-09-12 LAB — PROTIME-INR
INR: 2.34
PROTHROMBIN TIME: 25.5 s — AB (ref 11.4–15.2)

## 2017-09-12 MED ORDER — CYCLOBENZAPRINE HCL 10 MG PO TABS: 10.0000 mg | ORAL_TABLET | Freq: Two times a day (BID) | ORAL | 0 refills | Status: DC | PRN

## 2017-09-12 MED ORDER — IOPAMIDOL (ISOVUE-300) INJECTION 61%
100.0000 mL | Freq: Once | INTRAVENOUS | Status: AC | PRN
  Administered 2017-09-12: 100 mL via INTRAVENOUS

## 2017-09-12 NOTE — Discharge Instructions (Signed)
Schedule follow-up with Dr. Gerarda Fraction to make sure that he knows about your abdominal aortic aneurysm and can schedule yearly imaging to make sure it is not enlarging.
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
## 2017-09-18 ENCOUNTER — Ambulatory Visit (HOSPITAL_COMMUNITY): Payer: PPO

## 2017-09-22 ENCOUNTER — Ambulatory Visit (HOSPITAL_COMMUNITY): Payer: PPO

## 2017-09-25 ENCOUNTER — Encounter (HOSPITAL_COMMUNITY): Payer: Self-pay

## 2017-09-25 ENCOUNTER — Encounter (HOSPITAL_COMMUNITY): Payer: PPO | Admitting: Oncology

## 2017-09-25 ENCOUNTER — Other Ambulatory Visit: Payer: Self-pay

## 2017-09-25 DIAGNOSIS — D696 Thrombocytopenia, unspecified: Secondary | ICD-10-CM | POA: Insufficient documentation

## 2017-10-02 DIAGNOSIS — R350 Frequency of micturition: Secondary | ICD-10-CM | POA: Diagnosis not present

## 2017-10-02 DIAGNOSIS — R3912 Poor urinary stream: Secondary | ICD-10-CM | POA: Diagnosis not present

## 2017-10-02 DIAGNOSIS — N401 Enlarged prostate with lower urinary tract symptoms: Secondary | ICD-10-CM | POA: Diagnosis not present

## 2017-10-02 DIAGNOSIS — N5201 Erectile dysfunction due to arterial insufficiency: Secondary | ICD-10-CM | POA: Diagnosis not present

## 2017-10-04 ENCOUNTER — Ambulatory Visit (INDEPENDENT_AMBULATORY_CARE_PROVIDER_SITE_OTHER): Payer: PPO | Admitting: *Deleted

## 2017-10-04 DIAGNOSIS — I4891 Unspecified atrial fibrillation: Secondary | ICD-10-CM | POA: Diagnosis not present

## 2017-10-04 DIAGNOSIS — Z7901 Long term (current) use of anticoagulants: Secondary | ICD-10-CM

## 2017-10-04 DIAGNOSIS — Z5181 Encounter for therapeutic drug level monitoring: Secondary | ICD-10-CM | POA: Diagnosis not present

## 2017-10-04 LAB — POCT INR: INR: 2.2

## 2017-10-04 NOTE — Patient Instructions (Signed)
Continue coumadin 1 tablet daily except 1/2 tablet on Mondays  Recheck in 6 weeks 

## 2017-10-09 ENCOUNTER — Ambulatory Visit (INDEPENDENT_AMBULATORY_CARE_PROVIDER_SITE_OTHER): Payer: PPO | Admitting: *Deleted

## 2017-10-09 ENCOUNTER — Telehealth: Payer: Self-pay | Admitting: Cardiology

## 2017-10-09 DIAGNOSIS — I4891 Unspecified atrial fibrillation: Secondary | ICD-10-CM | POA: Diagnosis not present

## 2017-10-09 NOTE — Telephone Encounter (Signed)
LMOVM reminding pt to send remote transmission.   

## 2017-10-11 LAB — CUP PACEART REMOTE DEVICE CHECK
Battery Voltage: 2.96 V
Brady Statistic AS VS Percent: 60.97 %
Brady Statistic RV Percent Paced: 0.43 %
Date Time Interrogation Session: 20190101023031
Implantable Lead Location: 753860
Lead Channel Sensing Intrinsic Amplitude: 4.928 mV
Lead Channel Setting Pacing Amplitude: 2 V
MDC IDC LEAD IMPLANT DT: 20120820
MDC IDC LEAD IMPLANT DT: 20120820
MDC IDC LEAD LOCATION: 753859
MDC IDC MSMT LEADCHNL RA IMPEDANCE VALUE: 440 Ohm
MDC IDC MSMT LEADCHNL RV IMPEDANCE VALUE: 432 Ohm
MDC IDC MSMT LEADCHNL RV SENSING INTR AMPL: 8.224 mV
MDC IDC PG IMPLANT DT: 20120820
MDC IDC SET LEADCHNL RV PACING AMPLITUDE: 2.5 V
MDC IDC SET LEADCHNL RV PACING PULSEWIDTH: 0.6 ms
MDC IDC SET LEADCHNL RV SENSING SENSITIVITY: 0.9 mV
MDC IDC STAT BRADY AP VP PERCENT: 0.2 %
MDC IDC STAT BRADY AP VS PERCENT: 38.6 %
MDC IDC STAT BRADY AS VP PERCENT: 0.24 %
MDC IDC STAT BRADY RA PERCENT PACED: 37.38 %

## 2017-10-11 NOTE — Progress Notes (Signed)
Remote pacemaker transmission.   

## 2017-10-13 ENCOUNTER — Encounter: Payer: Self-pay | Admitting: Cardiology

## 2017-10-13 NOTE — Progress Notes (Signed)
Letter  

## 2017-10-18 DIAGNOSIS — H903 Sensorineural hearing loss, bilateral: Secondary | ICD-10-CM | POA: Diagnosis not present

## 2017-10-30 DIAGNOSIS — I519 Heart disease, unspecified: Secondary | ICD-10-CM | POA: Diagnosis not present

## 2017-10-30 DIAGNOSIS — H903 Sensorineural hearing loss, bilateral: Secondary | ICD-10-CM | POA: Diagnosis not present

## 2017-10-30 DIAGNOSIS — I10 Essential (primary) hypertension: Secondary | ICD-10-CM | POA: Diagnosis not present

## 2017-10-30 DIAGNOSIS — I4891 Unspecified atrial fibrillation: Secondary | ICD-10-CM | POA: Diagnosis not present

## 2017-10-30 DIAGNOSIS — N183 Chronic kidney disease, stage 3 (moderate): Secondary | ICD-10-CM | POA: Diagnosis not present

## 2017-11-08 ENCOUNTER — Ambulatory Visit (INDEPENDENT_AMBULATORY_CARE_PROVIDER_SITE_OTHER): Payer: PPO | Admitting: *Deleted

## 2017-11-08 DIAGNOSIS — Z5181 Encounter for therapeutic drug level monitoring: Secondary | ICD-10-CM

## 2017-11-08 DIAGNOSIS — Z7901 Long term (current) use of anticoagulants: Secondary | ICD-10-CM | POA: Diagnosis not present

## 2017-11-08 DIAGNOSIS — I4891 Unspecified atrial fibrillation: Secondary | ICD-10-CM

## 2017-11-08 LAB — POCT INR: INR: 1.8

## 2017-11-08 NOTE — Patient Instructions (Signed)
Take 1 1/2 tablets tonight then increase dose to 1 tablet daily    Recheck in 4 weeks

## 2017-11-13 ENCOUNTER — Encounter: Payer: Self-pay | Admitting: Cardiovascular Disease

## 2017-11-13 ENCOUNTER — Other Ambulatory Visit (HOSPITAL_COMMUNITY): Payer: Self-pay | Admitting: Nephrology

## 2017-11-13 ENCOUNTER — Ambulatory Visit: Payer: PPO | Admitting: Cardiovascular Disease

## 2017-11-13 VITALS — BP 124/80 | HR 64 | Ht 72.0 in | Wt 200.0 lb

## 2017-11-13 DIAGNOSIS — I712 Thoracic aortic aneurysm, without rupture: Secondary | ICD-10-CM

## 2017-11-13 DIAGNOSIS — I714 Abdominal aortic aneurysm, without rupture, unspecified: Secondary | ICD-10-CM

## 2017-11-13 DIAGNOSIS — R55 Syncope and collapse: Secondary | ICD-10-CM

## 2017-11-13 DIAGNOSIS — I480 Paroxysmal atrial fibrillation: Secondary | ICD-10-CM

## 2017-11-13 DIAGNOSIS — I519 Heart disease, unspecified: Secondary | ICD-10-CM | POA: Diagnosis not present

## 2017-11-13 DIAGNOSIS — Z7901 Long term (current) use of anticoagulants: Secondary | ICD-10-CM | POA: Diagnosis not present

## 2017-11-13 DIAGNOSIS — Z950 Presence of cardiac pacemaker: Secondary | ICD-10-CM

## 2017-11-13 DIAGNOSIS — N183 Chronic kidney disease, stage 3 unspecified: Secondary | ICD-10-CM

## 2017-11-13 DIAGNOSIS — I10 Essential (primary) hypertension: Secondary | ICD-10-CM

## 2017-11-13 DIAGNOSIS — I2510 Atherosclerotic heart disease of native coronary artery without angina pectoris: Secondary | ICD-10-CM

## 2017-11-13 DIAGNOSIS — I7121 Aneurysm of the ascending aorta, without rupture: Secondary | ICD-10-CM

## 2017-11-13 MED ORDER — WARFARIN SODIUM 5 MG PO TABS: ORAL_TABLET | ORAL | 0 refills | Status: DC

## 2017-11-13 MED ORDER — CARVEDILOL 12.5 MG PO TABS: 12.5000 mg | ORAL_TABLET | Freq: Two times a day (BID) | ORAL | 5 refills | Status: DC

## 2017-11-13 NOTE — Progress Notes (Signed)
Patient ID: Alex Maddox, male   DOB: 10-16-1950, 67 y.o.   MRN: [id number]    Cardiology Office Note    Date:  11/13/2017   ID:  Alex Maddox, DOB 03/13/1951, MRN [id number]  PCP:  Redmond School, MD  Cardiologist:   Sanda Klein, MD   Chief Complaint  Patient presents with  . Follow-up    12 months    History of Present Illness:  Alex Maddox is a 67 y.o. male returning for routine f/u for paroxysmal atrial fibrillation, neurally mediated syncope and pacemaker therapy.  He had a syncopal event a couple of weeks ago.  He had taken a "scalding hot bath" (per his wife) and passed out in the bathroom.  She was scared because he fell against the door and she was not able to get him to help him.  They called EMS.  He eventually recovered without any sequelae.  These events happened not long after he was started on amlodipine 5 mg once daily because his blood pressure was high.  Has not had any injuries or bleeding problems on chronic warfarin anticoagulation.  Other than the syncopal event has not had any neurological problems.  Recent labs are good with a creatinine of 1.03, hemoglobin 16.4, potassium 3.5, sodium 140, hemoglobin A1c 5.4%, normal liver function tests, Total cholesterol 138, HDL 36, LDL 81, triglycerides 103 TSH 1.15, PSA 0.6  Pacemaker interrogation shows normal device function. Battery voltage is good. His device is a Medtronic Revo dual-chamber pacemaker implanted in 2012. He has 43% atrial pacing and 0.4% ventricular pacing. His burden of atrial fibrillation is 3%, similar to his previous trend the episodes are frequently very brief lasting a few minutes and never last for a full 24 hours.  Rate control is fair.  The average ventricular rate is almost never over 110 bpm.  He has episodes of atrial fibrillation that last for process may 4-12 hours once or twice a month.  There have been no meaningful episodes of high ventricular rate in the recent past.  There is no  correlation between his episodes of atrial fibrillation and his syncope.  Activity level has trended down a little bit in the winter months but is still good at 3.6 hours a day.  Battery voltage is 2.96 (RRT 2.81 V).  He continues to have problems with erectile dysfunction and does not have a full erection ever.  Sildenafil 100 mg daily makes little difference.  He has recently seen a urologist.  He has a history of neurally mediated syncope with both cardioinhibitory and vasodepressor components and received a dual-chamber Medtronic MRI conditional permanent pacemaker in August 2012. He has a history of paroxysmal atrial fibrillation and a history of TIA at age 87. He had a traumatic right occipital intracerebral hemorrhage while on warfarin anticoagulation in November 2016. He also has systemic hypertension and hyperlipidemia, but no significant structural cardiac illness. Incidental note of coronary artery calcification seen on CT chest. Normal nuclear stress test perfusion pattern in 2012, EF 43%; similar EF by echocardiography. Echo images are very difficult and follow-up echo in 2015 could not quantify EF. He has not had clinical heart failure. He takes a statin for hyperlipidemia as well as valsartan and carvedilol for both the arrhythmia, the HTN and the cardiomyopathy. He has ascending aortic enlargement (4.2 cm) and fusiform ectasia of the infrarenal abdominal aorta (2.82.5 cm) and CT evidence of coronary calcification.  Past Medical History:  Diagnosis Date  . Abnormal echocardiogram  stress myoview 10/29/2010  . Arthritis    " IN MY BACK "  . Dyslipidemia   . Dysrhythmia    Atrial fibrillation  . Hypertension   . Paroxysmal atrial fibrillation (HCC)   . Presence of permanent cardiac pacemaker   . Sinus node dysfunction (HCC)   . Sleep apnea, obstructive    sleep study 10/09/2007  AHI 5.73/hr  REM AHI 11.90/hr   . 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   LEFT THUMB   .  Stroke ([place])   . Syncope    2D Echocardiogram 09/27/2010 EF between 40 to 45%  . Tachycardia-bradycardia ([place])   . Transient ischemic attack (TIA) 06/07    Past Surgical History:  Procedure Laterality Date  . COLONOSCOPY    . COLONOSCOPY N/A 06/07/2017   Procedure: COLONOSCOPY;  Surgeon: Rogene Houston, MD;  Location: [facility];  Service: Endoscopy;  Laterality: N/A;  9:30  . ELECTROCARDIOGRAM    . INSERT / REPLACE / REMOVE PACEMAKER  05/2011  . LOOP RECORDER IMPLANT  02/02/2011   Medtronic- CRM   . PACEMAKER INSERTION  05/30/2011   Medtronic Revo   last checked 11/07/2012    Outpatient Medications Prior to Visit  Medication Sig Dispense Refill  . amLODipine (NORVASC) 5 MG tablet Take 2.5 mg by mouth daily.     . cyclobenzaprine (FLEXERIL) 10 MG tablet Take 1 tablet (10 mg total) by mouth 2 (two) times daily as needed for muscle spasms. 20 tablet 0  . diazepam (VALIUM) 2 MG tablet Take 2 mg by mouth every 6 (six) hours as needed for anxiety.    . finasteride (PROSCAR) 5 MG tablet Take 5 mg by mouth daily.    . fish oil-omega-3 fatty acids 1000 MG capsule Take 1 g by mouth daily.    . irbesartan (AVAPRO) 300 MG tablet Take 1 tablet (300 mg total) by mouth daily. 90 tablet 1  . Misc Natural Products (PROSTATE HEALTH PO) Take 2 capsules by mouth every morning.    . Multiple Vitamin (MULTIVITAMIN) tablet Take 1 tablet by mouth daily.    . Saw Palmetto 450 MG CAPS Take 3 capsules by mouth daily.    . simvastatin (ZOCOR) 40 MG tablet Take 1 tablet (40 mg total) by mouth daily at 6 PM. 90 tablet 1  . carvedilol (COREG) 25 MG tablet Take 1 tablet (25 mg total) by mouth 2 (two) times daily with a meal. 180 tablet 1  . warfarin (COUMADIN) 5 MG tablet Take one to one and one-half tablets by mouth daily as directed by coumadin clinic 135 tablet 0   No facility-administered medications prior to visit.      Allergies:   Patient has no known allergies.   Social History   Socioeconomic  History  . Marital status: Married    Spouse name: None  . Number of children: 0  . Years of education: None  . Highest education level: None  Social Needs  . Financial resource strain: None  . Food insecurity - worry: None  . Food insecurity - inability: None  . Transportation needs - medical: None  . Transportation needs - non-medical: None  Occupational History  . None  Tobacco Use  . Smoking status: Never Smoker  . Smokeless tobacco: Never Used  Substance and Sexual Activity  . Alcohol use: Yes    Comment: occasionally  . Drug use: No  . Sexual activity: None  Other Topics Concern  . 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     Family History:  The patient's family history includes Heart attack (age of onset: 31) in his father; Hypertension in his brother and mother.   ROS:   Please see the history of present illness.    ROS All other systems reviewed and are negative.   PHYSICAL EXAM:   VS:  BP 124/80   Pulse 64   Ht 6' (1.829 m)   Wt 200 lb (90.7 kg)   BMI 27.12 kg/m     General: Alert, oriented x3, no distress, overweight Head: no evidence of trauma, PERRL, EOMI, no exophtalmos or lid lag, no myxedema, no xanthelasma; normal ears, nose and oropharynx Neck: normal jugular venous pulsations and no hepatojugular reflux; brisk carotid pulses without delay and no carotid bruits Chest: clear to auscultation, no signs of consolidation by percussion or palpation, normal fremitus, symmetrical and full respiratory excursions Cardiovascular: normal position and quality of the apical impulse, regular rhythm, normal first and second heart sounds, no murmurs, rubs or gallops Abdomen: no tenderness or distention, no masses by palpation, no abnormal pulsatility or arterial bruits, normal bowel sounds, no hepatosplenomegaly Extremities: no clubbing, cyanosis or edema; 2+ radial, ulnar and brachial pulses bilaterally; 2+ right femoral,  posterior tibial and dorsalis pedis pulses; 2+ left femoral, posterior tibial and dorsalis pedis pulses; no subclavian or femoral bruits Neurological: grossly nonfocal Psych: Normal mood and affect   Wt Readings from Last 3 Encounters:  11/13/17 200 lb (90.7 kg)  09/25/17 199 lb 8 oz (90.5 kg)  09/11/17 190 lb (86.2 kg)      Studies/Labs Reviewed:   EKG:  EKG is ordered today.  The ekg ordered today demonstrates atrial paced ventricular sensed rhythm with right bundle branch block (old), QTc 451 ms   ASSESSMENT:    1. Paroxysmal atrial fibrillation (HCC)   2. Long term current use of anticoagulant   3. Neurocardiogenic syncope   4. Pacemaker   5. Ascending aortic aneurysm ([place])   6. AAA (abdominal aortic aneurysm) without rupture ([employer])   7. Essential hypertension   8. Coronary artery calcification seen on CT scan   9. Left ventricular systolic dysfunction without heart failure      PLAN:  In order of problems listed above:  1. PAF: He is only aware of the palpitations with some of the episodes of atrial fibrillation and ignores most of them.  There is no correlation between the arrhythmia and the syncopal events.  Antiarrhythmics are not justified.  Rate control is satisfactory.  He is appropriately anticoagulated. CHADS Vasc 5 (TIA 2, HTN, vascular disease, LV dysfunction) 2. Warfarin: Since he had his head injury in November 2016, no serious bleeding complications 3. Neurally mediated syncope: I think this is the first time he has had full-blown syncope since his pacemaker was implanted and the circumstances are highly compatible with a neurocardiogenic event, probably triggered by vasodilatation with a hot bath.  He enjoys taking hot baths because they help with his low back pain.  Discussed the need to stay very well-hydrated and avoid excessive heat since this will predispose him to losing consciousness.  Change positions slowly.  When he feels the prodromal symptoms of  syncope, immediately lie flat, raise his legs or crouch.  The addition of a vasodilator medication (amlodipine) may also have made him more prone to syncope.  I think we need to be a little more liberal with his blood pressure control  and I be happy if his blood pressure is in the 130-140/80-90 range.  Since he also has complaints of erectile dysfunction, we will try to back off his beta-blocker, but he always will require some beta-blocker for rate control for his atrial fibrillation.  Consider switching to metoprolol (without the vasodilator effect of carvedilol), if this is necessary. 4. PPM:  Normal device function, Carelink Q 3 mos. 5. Asc Ao aneurysm: CT angiogram performed in April 2018 showed a 4.3 cm aneurysm, essentially unchanged from the previous study when it was 4.2 cm I think we can increase the monitoring interval to every other year.  Asymptomatic. 6. AAA: CT abdomen performed in December 2018 showed an unchanged 2.8 cm fusiform infrarenal aneurysm.  His aortic aneurysms are another reason it is important for him to stay on a beta-blocker 7. HTN: May need to avoid perfect control to lessen the risk of syncope.  Continue to avoid diuretics and other agents that can increase risk of syncope (stopping diuretics had a positive impact on syncope prevention).  8. Coronary Ca: He does not have angina and had a normal nuclear stress test in the past (2012). On statin. 9. LV dysfunction: EF around 43% based on previous echo nuclear stress test, but he has never had clinical manifestations of heart failure.  Carvedilol is also beneficial for this indication as well.  Clinically euvolemic, avoid diuretics.    Medication Adjustments/Labs and Tests Ordered: Current medicines are reviewed at length with the patient today.  Concerns regarding medicines are outlined above.  Medication changes, Labs and Tests ordered today are listed in the Patient Instructions below. Patient Instructions  Dr Sallyanne Kuster has  recommended making the following medication changes: 1. DECREASE Carvedilol to 12.5 mg twice daily  Your physician has requested that you regularly monitor your blood pressure at home. Please use the same machine to check your blood pressure daily. Keep a record of your blood pressures using the log sheet provided. In 2 weeks, please report your readings back to Dr C. You may use our online patient portal 'MyChart' or you can call the office to speak with a nurse. Fax number: [phone] attention: [place]  Remote monitoring is used to monitor your Pacemaker or ICD from home. This monitoring reduces the number of office visits required to check your device to one time per year. It allows [name] to keep an eye on the functioning of your device to ensure it is working properly. You are scheduled for a device check from home on Monday, April 1st, 2019. You may send your transmission at any time that day. If you have a wireless device, the transmission will be sent automatically. After your physician reviews your transmission, you will receive a notification with your next transmission date.  Dr Sallyanne Kuster recommends that you schedule a follow-up appointment in 12 months with a pacemaker check. You will receive a reminder letter in the mail two months in advance. If you don't receive a letter, please call our office to schedule the follow-up appointment.  If you need a refill on your cardiac medications before your next appointment, please call your pharmacy.      Signed, Sanda Klein, MD  11/13/2017 11:46 AM    [facility] [place], [place], [place]  [address] Phone: [phone]; Fax: [phone]

## 2017-11-13 NOTE — Patient Instructions (Addendum)
Dr Sallyanne Kuster has recommended making the following medication changes: 1. DECREASE Carvedilol to 12.5 mg twice daily  Your physician has requested that you regularly monitor your blood pressure at home. Please use the same machine to check your blood pressure daily. Keep a record of your blood pressures using the log sheet provided. In 2 weeks, please report your readings back to Dr C. You may use our online patient portal 'MyChart' or you can call the office to speak with a nurse. Fax number: [phone] attention: [place]  Remote monitoring is used to monitor your Pacemaker or ICD from home. This monitoring reduces the number of office visits required to check your device to one time per year. It allows [name] to keep an eye on the functioning of your device to ensure it is working properly. You are scheduled for a device check from home on Monday, April 1st, 2019. You may send your transmission at any time that day. If you have a wireless device, the transmission will be sent automatically. After your physician reviews your transmission, you will receive a notification with your next transmission date.  Dr Sallyanne Kuster recommends that you schedule a follow-up appointment in 12 months with a pacemaker check. You will receive a reminder letter in the mail two months in advance. If you don't receive a letter, please call our office to schedule the follow-up appointment.  If you need a refill on your cardiac medications before your next appointment, please call your pharmacy.

## 2017-11-15 ENCOUNTER — Telehealth: Payer: Self-pay | Admitting: Cardiovascular Disease

## 2017-11-15 NOTE — Telephone Encounter (Signed)
The patient called in wanting to clarify the amount of Amlodipine he was prescribed to take. According to his last office visit on 2/4 with Dr. Sallyanne Kuster, he should be taking Amlodipine 2.5 mg daily. His carvedilol was decreased to 12.5 mg bid. He has verbalized his understanding.

## 2017-11-15 NOTE — Telephone Encounter (Signed)
Please call,question about how he is supposed to take his Amlodipine.If not there,please leave a detailed message. Marland KitchenMarland Kitchen

## 2017-11-20 LAB — CUP PACEART INCLINIC DEVICE CHECK
Date Time Interrogation Session: 20190211152211
Implantable Lead Implant Date: 20120820
Implantable Lead Location: 753860
MDC IDC LEAD IMPLANT DT: 20120820
MDC IDC LEAD LOCATION: 753859
MDC IDC PG IMPLANT DT: 20120820

## 2017-11-28 ENCOUNTER — Ambulatory Visit (HOSPITAL_COMMUNITY)
Admission: RE | Admit: 2017-11-28 | Discharge: 2017-11-28 | Disposition: A | Discharge status: Home / Self Care | Payer: PPO | Source: Ambulatory Visit | Attending: Nephrology | Admitting: Nephrology

## 2017-11-28 DIAGNOSIS — I10 Essential (primary) hypertension: Secondary | ICD-10-CM | POA: Diagnosis not present

## 2017-11-28 DIAGNOSIS — D509 Iron deficiency anemia, unspecified: Secondary | ICD-10-CM | POA: Diagnosis not present

## 2017-11-28 DIAGNOSIS — R809 Proteinuria, unspecified: Secondary | ICD-10-CM | POA: Diagnosis not present

## 2017-11-28 DIAGNOSIS — N183 Chronic kidney disease, stage 3 unspecified: Secondary | ICD-10-CM

## 2017-11-28 DIAGNOSIS — D519 Vitamin B12 deficiency anemia, unspecified: Secondary | ICD-10-CM | POA: Diagnosis not present

## 2017-11-28 DIAGNOSIS — Z1159 Encounter for screening for other viral diseases: Secondary | ICD-10-CM | POA: Diagnosis not present

## 2017-11-28 DIAGNOSIS — Z79899 Other long term (current) drug therapy: Secondary | ICD-10-CM | POA: Diagnosis not present

## 2017-11-28 DIAGNOSIS — E559 Vitamin D deficiency, unspecified: Secondary | ICD-10-CM | POA: Diagnosis not present

## 2017-12-05 ENCOUNTER — Other Ambulatory Visit: Payer: Self-pay | Admitting: Cardiovascular Disease

## 2017-12-06 ENCOUNTER — Ambulatory Visit (INDEPENDENT_AMBULATORY_CARE_PROVIDER_SITE_OTHER): Payer: PPO | Admitting: *Deleted

## 2017-12-06 DIAGNOSIS — Z5181 Encounter for therapeutic drug level monitoring: Secondary | ICD-10-CM

## 2017-12-06 DIAGNOSIS — I4891 Unspecified atrial fibrillation: Secondary | ICD-10-CM | POA: Diagnosis not present

## 2017-12-06 DIAGNOSIS — Z7901 Long term (current) use of anticoagulants: Secondary | ICD-10-CM

## 2017-12-06 LAB — POCT INR: INR: 2.5

## 2017-12-06 NOTE — Patient Instructions (Signed)
Continue coumadin 1 tablet daily Recheck in 4 weeks 

## 2017-12-08 NOTE — Telephone Encounter (Signed)
Follow up   Pharmacy calling. Medication is out of stock.   Pt c/o medication issue:  1. Name of Medication: irbesartan (AVAPRO) 300 MG tablet  2. How are you currently taking this medication (dosage and times per day)?as prescribed  3. Are you having a reaction (difficulty breathing--STAT)? NO  4. What is your medication issue? Medication is out of stock. Please advise

## 2017-12-12 NOTE — Telephone Encounter (Signed)
F/U Call:  Pharmacy calling   *STAT* If patient is at the pharmacy, call can be transferred to refill team.   1. Which medications need to be refilled? (please list name of each medication and dose if known) Irbesartan 300 mg   2. Which pharmacy/location (including street and city if local pharmacy) is medication to be sent to?[place]  3. Do they need a 30 day or 90 day supply?

## 2017-12-12 NOTE — Telephone Encounter (Signed)
Can we please replace with losartan 100 mg daily? If also unavailable, lisinopril 40 mg daily, please MCr

## 2017-12-13 DIAGNOSIS — N182 Chronic kidney disease, stage 2 (mild): Secondary | ICD-10-CM | POA: Diagnosis not present

## 2017-12-13 DIAGNOSIS — R809 Proteinuria, unspecified: Secondary | ICD-10-CM | POA: Diagnosis not present

## 2017-12-21 ENCOUNTER — Other Ambulatory Visit: Payer: Self-pay | Admitting: Cardiovascular Disease

## 2018-01-03 ENCOUNTER — Ambulatory Visit (INDEPENDENT_AMBULATORY_CARE_PROVIDER_SITE_OTHER): Payer: PPO | Admitting: *Deleted

## 2018-01-03 DIAGNOSIS — I4891 Unspecified atrial fibrillation: Secondary | ICD-10-CM | POA: Diagnosis not present

## 2018-01-03 DIAGNOSIS — Z5181 Encounter for therapeutic drug level monitoring: Secondary | ICD-10-CM | POA: Diagnosis not present

## 2018-01-03 DIAGNOSIS — Z7901 Long term (current) use of anticoagulants: Secondary | ICD-10-CM

## 2018-01-03 LAB — POCT INR: INR: 2.2

## 2018-01-03 NOTE — Patient Instructions (Signed)
Continue coumadin 1 tablet daily Recheck in 4 weeks 

## 2018-01-08 ENCOUNTER — Ambulatory Visit (INDEPENDENT_AMBULATORY_CARE_PROVIDER_SITE_OTHER): Payer: PPO | Admitting: *Deleted

## 2018-01-08 ENCOUNTER — Telehealth: Payer: Self-pay | Admitting: Cardiology

## 2018-01-08 DIAGNOSIS — I4891 Unspecified atrial fibrillation: Secondary | ICD-10-CM | POA: Diagnosis not present

## 2018-01-08 NOTE — Telephone Encounter (Signed)
LMOVM reminding pt to send remote transmission.   

## 2018-01-09 ENCOUNTER — Encounter: Payer: Self-pay | Admitting: Cardiology

## 2018-01-09 NOTE — Progress Notes (Signed)
Remote pacemaker transmission.   

## 2018-01-12 LAB — CUP PACEART REMOTE DEVICE CHECK
Battery Voltage: 2.95 V
Brady Statistic AS VS Percent: 61.95 %
Implantable Lead Location: 753860
Lead Channel Impedance Value: 432 Ohm
Lead Channel Sensing Intrinsic Amplitude: 5.324 mV
MDC IDC LEAD IMPLANT DT: 20120820
MDC IDC LEAD IMPLANT DT: 20120820
MDC IDC LEAD LOCATION: 753859
MDC IDC MSMT LEADCHNL RA IMPEDANCE VALUE: 432 Ohm
MDC IDC MSMT LEADCHNL RV SENSING INTR AMPL: 9.54 mV
MDC IDC PG IMPLANT DT: 20120820
MDC IDC SESS DTM: 20190402003705
MDC IDC SET LEADCHNL RA PACING AMPLITUDE: 2 V
MDC IDC SET LEADCHNL RV PACING AMPLITUDE: 2.5 V
MDC IDC SET LEADCHNL RV PACING PULSEWIDTH: 0.6 ms
MDC IDC SET LEADCHNL RV SENSING SENSITIVITY: 0.9 mV
MDC IDC STAT BRADY AP VP PERCENT: 0.14 %
MDC IDC STAT BRADY AP VS PERCENT: 37.74 %
MDC IDC STAT BRADY AS VP PERCENT: 0.18 %
MDC IDC STAT BRADY RA PERCENT PACED: 36.58 %
MDC IDC STAT BRADY RV PERCENT PACED: 0.33 %

## 2018-01-17 ENCOUNTER — Encounter: Payer: Self-pay | Admitting: Cardiovascular Disease

## 2018-01-17 ENCOUNTER — Ambulatory Visit: Payer: PPO | Admitting: Cardiovascular Disease

## 2018-01-17 VITALS — BP 120/84 | HR 61 | Ht 72.0 in | Wt 197.0 lb

## 2018-01-17 DIAGNOSIS — Z950 Presence of cardiac pacemaker: Secondary | ICD-10-CM | POA: Diagnosis not present

## 2018-01-17 DIAGNOSIS — I480 Paroxysmal atrial fibrillation: Secondary | ICD-10-CM | POA: Diagnosis not present

## 2018-01-17 DIAGNOSIS — R55 Syncope and collapse: Secondary | ICD-10-CM | POA: Diagnosis not present

## 2018-01-17 DIAGNOSIS — I7121 Aneurysm of the ascending aorta, without rupture: Secondary | ICD-10-CM

## 2018-01-17 DIAGNOSIS — I10 Essential (primary) hypertension: Secondary | ICD-10-CM | POA: Diagnosis not present

## 2018-01-17 DIAGNOSIS — I2510 Atherosclerotic heart disease of native coronary artery without angina pectoris: Secondary | ICD-10-CM | POA: Diagnosis not present

## 2018-01-17 DIAGNOSIS — Z7901 Long term (current) use of anticoagulants: Secondary | ICD-10-CM

## 2018-01-17 DIAGNOSIS — I519 Heart disease, unspecified: Secondary | ICD-10-CM | POA: Diagnosis not present

## 2018-01-17 DIAGNOSIS — I714 Abdominal aortic aneurysm, without rupture, unspecified: Secondary | ICD-10-CM

## 2018-01-17 DIAGNOSIS — I712 Thoracic aortic aneurysm, without rupture: Secondary | ICD-10-CM | POA: Diagnosis not present

## 2018-01-17 MED ORDER — AMLODIPINE BESYLATE 2.5 MG PO TABS: 2.5000 mg | ORAL_TABLET | Freq: Every day | ORAL | 3 refills | Status: DC

## 2018-01-17 NOTE — Progress Notes (Signed)
Patient ID: Alex Maddox, male   DOB: May 05, 1951, 67 y.o.   MRN: [id number]    Cardiology Office Note    Date:  01/21/2018   ID:  Alex Maddox, DOB 03-17-1951, MRN [id number]  PCP:  Redmond School, MD  Cardiologist:   Sanda Klein, MD   Chief Complaint  Patient presents with  . Pacemaker Check    History of Present Illness:  Alex Maddox is a 67 y.o. male returning for routine f/u for paroxysmal atrial fibrillation, neurally mediated syncope and pacemaker therapy.  Since his last visit he has not had any new syncopal events.  He generally feels very well.  He remains unaware of his episodes of paroxysmal atrial fibrillation which occurred relatively frequently with an overall burden of about 3%.  He has no cardiovascular complaints, but remains preoccupied by problems with erectile dysfunction.  Phosphodiesterase inhibitors had no benefit whatsoever.  He is seeing a urologist to discuss further options.  For a while he seemed excited about the idea of the penile vasodilator injection, but he thought it was a simple office-based therapy do not provide lasting results, not a self administered as needed treatment.  Has not had any injuries or bleeding problems on chronic warfarin anticoagulation.    Recent labs are good with a creatinine of 1.03, hemoglobin 16.4, potassium 3.5, sodium 140, hemoglobin A1c 5.4%, normal liver function tests, Total cholesterol 138, HDL 36, LDL 81, triglycerides 103 TSH 1.15, PSA 0.6  Pacemaker interrogation shows normal device function. Battery voltage is good at 2.95V. His device is a Medtronic Revo dual-chamber pacemaker implanted in 2012. He has 37% atrial pacing and 0.3% ventricular pacing. His burden of atrial fibrillation is 3.4%, similar to his previous trend.  Rate control is fair, though not perfect.  The average ventricular rate is almost never over 110 bpm.   Battery voltage is 2.95V (RRT 2.81 V).  He has a history of neurally mediated syncope with  both cardioinhibitory and vasodepressor components and received a dual-chamber Medtronic MRI conditional permanent pacemaker in August 2012. He has a history of paroxysmal atrial fibrillation and a history of TIA at age 71. He had a traumatic right occipital intracerebral hemorrhage while on warfarin anticoagulation in November 2016. He also has systemic hypertension and hyperlipidemia, but no significant structural cardiac illness. Incidental note of coronary artery calcification seen on CT chest. Normal nuclear stress test perfusion pattern in 2012, EF 43%; similar EF by echocardiography. Echo images are very difficult and follow-up echo in 2015 could not quantify EF. He has not had clinical heart failure. He takes a statin for hyperlipidemia as well as valsartan and carvedilol for both the arrhythmia, the HTN and the cardiomyopathy. He has ascending aortic enlargement (4.2 cm) and fusiform ectasia of the infrarenal abdominal aorta (2.82.5 cm) and CT evidence of coronary calcification.  Past Medical History:  Diagnosis Date  . Abnormal echocardiogram    stress myoview 10/29/2010  . Arthritis    " IN MY BACK "  . Dyslipidemia   . Dysrhythmia    Atrial fibrillation  . Hypertension   . Paroxysmal atrial fibrillation (HCC)   . Presence of permanent cardiac pacemaker   . Sinus node dysfunction (HCC)   . Sleep apnea, obstructive    sleep study 10/09/2007  AHI 5.73/hr  REM AHI 11.90/hr   . 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   LEFT THUMB   . Stroke ([place])   . Syncope    2D Echocardiogram  09/27/2010 EF between 40 to 45%  . Tachycardia-bradycardia ([place])   . Transient ischemic attack (TIA) 06/07    Past Surgical History:  Procedure Laterality Date  . COLONOSCOPY    . COLONOSCOPY N/A 06/07/2017   Procedure: COLONOSCOPY;  Surgeon: Rogene Houston, MD;  Location: [facility];  Service: Endoscopy;  Laterality: N/A;  9:30  . ELECTROCARDIOGRAM    . INSERT / REPLACE / REMOVE PACEMAKER   05/2011  . LOOP RECORDER IMPLANT  02/02/2011   Medtronic- CRM   . PACEMAKER INSERTION  05/30/2011   Medtronic Revo   last checked 11/07/2012    Outpatient Medications Prior to Visit  Medication Sig Dispense Refill  . carvedilol (COREG) 12.5 MG tablet Take 1 tablet (12.5 mg total) by mouth 2 (two) times daily with a meal. 60 tablet 5  . diazepam (VALIUM) 2 MG tablet Take 2 mg by mouth every 6 (six) hours as needed for anxiety.    . finasteride (PROSCAR) 5 MG tablet Take 5 mg by mouth daily.    . fish oil-omega-3 fatty acids 1000 MG capsule Take 1 g by mouth daily.    . irbesartan (AVAPRO) 300 MG tablet TAKE ONE (1) TABLET BY MOUTH EVERY DAY 90 tablet 3  . Misc Natural Products (PROSTATE HEALTH PO) Take 2 capsules by mouth every morning.    . Multiple Vitamin (MULTIVITAMIN) tablet Take 1 tablet by mouth daily.    . Saw Palmetto 450 MG CAPS Take 3 capsules by mouth daily.    . simvastatin (ZOCOR) 40 MG tablet Take 1 tablet (40 mg total) by mouth daily at 6 PM. 90 tablet 1  . warfarin (COUMADIN) 5 MG tablet Take one to one and one-half tablets by mouth daily as directed by coumadin clinic 135 tablet 0  . amLODipine (NORVASC) 5 MG tablet Take 2.5 mg by mouth daily.     . cyclobenzaprine (FLEXERIL) 10 MG tablet Take 1 tablet (10 mg total) by mouth 2 (two) times daily as needed for muscle spasms. (Patient not taking: Reported on 01/17/2018) 20 tablet 0   No facility-administered medications prior to visit.      Allergies:   Patient has no known allergies.   Social History   Socioeconomic History  . Marital status: Married    Spouse name: Not on file  . Number of children: 0  . Years of education: Not on file  . Highest education level: Not on file  Occupational History  . Not on file  Social Needs  . Financial resource strain: Not on file  . Food insecurity:    Worry: Not on file    Inability: Not on file  . Transportation needs:    Medical: Not on file    Non-medical: Not on file    Tobacco Use  . Smoking status: Never Smoker  . Smokeless tobacco: Never Used  Substance and Sexual Activity  . Alcohol use: Yes    Comment: occasionally  . Drug use: No  . Sexual activity: Not on file  Lifestyle  . Physical activity:    Days per week: Not on file    Minutes per session: Not on file  . Stress: Not on file  Relationships  . Social connections:    Talks on phone: Not on file    Gets together: Not on file    Attends religious service: Not on file    Active member of club or organization: Not on file    Attends meetings of clubs  or organizations: Not on file    Relationship status: Not on file  Other Topics Concern  . 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     Family History:  The patient's family history includes Heart attack (age of onset: 73) in his father; Hypertension in his brother and mother.   ROS:   Please see the history of present illness.    ROS All other systems reviewed and are negative.   PHYSICAL EXAM:   VS:  BP 120/84   Pulse 61   Ht 6' (1.829 m)   Wt 197 lb (89.4 kg)   BMI 26.72 kg/m     General: Alert, oriented x3, no distress, healthy left subclavian pacemaker site Head: no evidence of trauma, PERRL, EOMI, no exophtalmos or lid lag, no myxedema, no xanthelasma; normal ears, nose and oropharynx Neck: normal jugular venous pulsations and no hepatojugular reflux; brisk carotid pulses without delay and no carotid bruits Chest: clear to auscultation, no signs of consolidation by percussion or palpation, normal fremitus, symmetrical and full respiratory excursions Cardiovascular: normal position and quality of the apical impulse, regular rhythm, normal first and second heart sounds, no murmurs, rubs or gallops Abdomen: no tenderness or distention, no masses by palpation, no abnormal pulsatility or arterial bruits, normal bowel sounds, no hepatosplenomegaly Extremities: no clubbing, cyanosis or  edema; 2+ radial, ulnar and brachial pulses bilaterally; 2+ right femoral, posterior tibial and dorsalis pedis pulses; 2+ left femoral, posterior tibial and dorsalis pedis pulses; no subclavian or femoral bruits Neurological: grossly nonfocal Psych: Normal mood and affect    Wt Readings from Last 3 Encounters:  01/17/18 197 lb (89.4 kg)  11/13/17 200 lb (90.7 kg)  09/25/17 199 lb 8 oz (90.5 kg)      Studies/Labs Reviewed:   EKG:  EKG is not ordered today.   ASSESSMENT:    1. Paroxysmal atrial fibrillation (HCC)   2. Long term (current) use of anticoagulants   3. Vasovagal syncope   4. Pacemaker   5. Ascending aortic aneurysm ([place])   6. AAA (abdominal aortic aneurysm) without rupture ([place])   7. Essential hypertension   8. Atherosclerosis of native coronary artery of native heart without angina pectoris   9. Asymptomatic left ventricular systolic dysfunction      PLAN:  In order of problems listed above:  1. PAF: Rate control is not perfect but is adequate.  Need to avoid excessive blood pressure reduction due to his history of vasodepressive syncope.  Turned down "atrial therapies" today.  He is appropriately anticoagulated. CHADS Vasc 5 (TIA 2, HTN, vascular disease, LV dysfunction) 2. Warfarin: He had a head injury 2016 but has never had other serious complications.  He has not had a stroke or TIA. 3. Neurally mediated syncope: Reminded him of how to avoid the triggers, reduce the likelihood of events and blood to do the prodromal symptoms occur.  Avoid perfect blood pressure control especially avoid vasodilators and diuretics.  I think we need to be a little more liberal with his blood pressure control and I be happy if his blood pressure is in the 130-140/80-90 range.   4. PPM:  Normal device function, [facility] Q 3 mos. 5. Asc Ao aneurysm: CT angiogram performed in April 2018 showed a 4.3 cm aneurysm, essentially unchanged from the previous study when it was 4.2 cm I think we  can increase the monitoring interval to every other year.  Asymptomatic. 6. AAA: CT  abdomen performed in December 2018 showed an unchanged 2.8 cm fusiform infrarenal aneurysm.  His aortic aneurysms are another reason it is important for him to stay on a beta-blocker 7. HTN: May need to avoid perfect control to lessen the risk of syncope.  Continue to avoid diuretics and other agents that can increase risk of syncope (stopping diuretics had a positive impact on syncope prevention).  8. Coronary Ca: He does not have angina and had a normal nuclear stress test in the past (2012). On statin. 9. LV dysfunction: EF around 43% based on previous echo nuclear stress test, but he has never had clinical manifestations of heart failure.  Carvedilol is also beneficial for this indication as well.  Clinically euvolemic, avoid diuretics.    Medication Adjustments/Labs and Tests Ordered: Current medicines are reviewed at length with the patient today.  Concerns regarding medicines are outlined above.  Medication changes, Labs and Tests ordered today are listed in the Patient Instructions below. Patient Instructions  Dr Sallyanne Kuster recommends that you continue on your current medications as directed. Please refer to the Current Medication list given to you today.  Remote monitoring is used to monitor your Pacemaker or ICD from home. This monitoring reduces the number of office visits required to check your device to one time per year. It allows [name] to keep an eye on the functioning of your device to ensure it is working properly. You are scheduled for a device check from home on Monday, July 1st, 2019. You may send your transmission at any time that day. If you have a wireless device, the transmission will be sent automatically. After your physician reviews your transmission, you will receive a notification with your next transmission date.  Dr Sallyanne Kuster recommends that you schedule a follow-up appointment in 12 months with  a pacemaker check. You will receive a reminder letter in the mail two months in advance. If you don't receive a letter, please call our office to schedule the follow-up appointment.  If you need a refill on your cardiac medications before your next appointment, please call your pharmacy.      Signed, Sanda Klein, MD  01/21/2018 8:03 PM    [address], [place], [place]  [address] Phone: [phone]; Fax: [phone]

## 2018-01-17 NOTE — Patient Instructions (Signed)

## 2018-01-18 DIAGNOSIS — N5201 Erectile dysfunction due to arterial insufficiency: Secondary | ICD-10-CM | POA: Diagnosis not present

## 2018-01-19 ENCOUNTER — Other Ambulatory Visit: Payer: Self-pay | Admitting: Cardiovascular Disease

## 2018-01-21 ENCOUNTER — Encounter: Payer: Self-pay | Admitting: Cardiovascular Disease

## 2018-01-22 NOTE — Telephone Encounter (Signed)
Rx has been sent to the pharmacy electronically. ° °

## 2018-01-31 ENCOUNTER — Ambulatory Visit (INDEPENDENT_AMBULATORY_CARE_PROVIDER_SITE_OTHER): Payer: PPO | Admitting: *Deleted

## 2018-01-31 DIAGNOSIS — I4891 Unspecified atrial fibrillation: Secondary | ICD-10-CM

## 2018-01-31 DIAGNOSIS — Z5181 Encounter for therapeutic drug level monitoring: Secondary | ICD-10-CM | POA: Diagnosis not present

## 2018-01-31 LAB — POCT INR: INR: 2

## 2018-01-31 NOTE — Patient Instructions (Signed)
Take coumadin 1 1/2 tablets tonight then resume 1 tablet daily    Recheck in 6 weeks

## 2018-03-14 ENCOUNTER — Ambulatory Visit (INDEPENDENT_AMBULATORY_CARE_PROVIDER_SITE_OTHER): Payer: PPO | Admitting: *Deleted

## 2018-03-14 DIAGNOSIS — Z5181 Encounter for therapeutic drug level monitoring: Secondary | ICD-10-CM

## 2018-03-14 DIAGNOSIS — I4891 Unspecified atrial fibrillation: Secondary | ICD-10-CM | POA: Diagnosis not present

## 2018-03-14 LAB — POCT INR: INR: 2.3 (ref 2.0–3.0)

## 2018-03-14 NOTE — Patient Instructions (Signed)
Continue coumadin 1 tablet daily Recheck in 6 weeks 

## 2018-03-28 ENCOUNTER — Other Ambulatory Visit: Payer: Self-pay | Admitting: Cardiovascular Disease

## 2018-04-09 ENCOUNTER — Telehealth: Payer: Self-pay | Admitting: Cardiology

## 2018-04-09 ENCOUNTER — Ambulatory Visit (INDEPENDENT_AMBULATORY_CARE_PROVIDER_SITE_OTHER): Payer: PPO | Admitting: *Deleted

## 2018-04-09 DIAGNOSIS — R55 Syncope and collapse: Secondary | ICD-10-CM

## 2018-04-09 NOTE — Telephone Encounter (Signed)
LMOVM reminding pt to send remote transmission.   

## 2018-04-10 NOTE — Progress Notes (Signed)
Remote pacemaker transmission.   

## 2018-04-11 ENCOUNTER — Encounter: Payer: Self-pay | Admitting: Cardiology

## 2018-04-13 LAB — CUP PACEART INCLINIC DEVICE CHECK
Implantable Lead Implant Date: 20120820
Implantable Lead Location: 753859
Implantable Pulse Generator Implant Date: 20120820
Lead Channel Setting Pacing Amplitude: 2.5 V
Lead Channel Setting Pacing Pulse Width: 0.6 ms
Lead Channel Setting Sensing Sensitivity: 0.9 mV
MDC IDC LEAD IMPLANT DT: 20120820
MDC IDC LEAD LOCATION: 753860
MDC IDC SESS DTM: 20190705150512
MDC IDC SET LEADCHNL RA PACING AMPLITUDE: 2 V

## 2018-04-19 LAB — CUP PACEART REMOTE DEVICE CHECK
Battery Voltage: 2.95 V
Brady Statistic AP VP Percent: 0.26 %
Brady Statistic AP VS Percent: 35.51 %
Brady Statistic AS VS Percent: 64.07 %
Implantable Lead Implant Date: 20120820
Implantable Lead Location: 753860
Lead Channel Impedance Value: 424 Ohm
Lead Channel Impedance Value: 432 Ohm
Lead Channel Sensing Intrinsic Amplitude: 5.148 mV
MDC IDC LEAD IMPLANT DT: 20120820
MDC IDC LEAD LOCATION: 753859
MDC IDC MSMT LEADCHNL RV SENSING INTR AMPL: 5.592 mV
MDC IDC PG IMPLANT DT: 20120820
MDC IDC SESS DTM: 20190702121723
MDC IDC SET LEADCHNL RA PACING AMPLITUDE: 2 V
MDC IDC SET LEADCHNL RV PACING AMPLITUDE: 2.5 V
MDC IDC SET LEADCHNL RV PACING PULSEWIDTH: 0.6 ms
MDC IDC SET LEADCHNL RV SENSING SENSITIVITY: 0.9 mV
MDC IDC STAT BRADY AS VP PERCENT: 0.16 %
MDC IDC STAT BRADY RA PERCENT PACED: 34.25 %
MDC IDC STAT BRADY RV PERCENT PACED: 0.42 %

## 2018-04-25 ENCOUNTER — Ambulatory Visit (INDEPENDENT_AMBULATORY_CARE_PROVIDER_SITE_OTHER): Payer: PPO | Admitting: *Deleted

## 2018-04-25 DIAGNOSIS — I4891 Unspecified atrial fibrillation: Secondary | ICD-10-CM

## 2018-04-25 DIAGNOSIS — Z5181 Encounter for therapeutic drug level monitoring: Secondary | ICD-10-CM | POA: Diagnosis not present

## 2018-04-25 LAB — POCT INR: INR: 2.8 (ref 2.0–3.0)

## 2018-04-25 NOTE — Patient Instructions (Signed)
Continue coumadin 1 tablet daily Recheck in 6 weeks 

## 2018-05-09 ENCOUNTER — Encounter (INDEPENDENT_AMBULATORY_CARE_PROVIDER_SITE_OTHER): Payer: Self-pay | Admitting: *Deleted

## 2018-05-14 ENCOUNTER — Other Ambulatory Visit: Payer: Self-pay | Admitting: Cardiovascular Disease

## 2018-06-06 ENCOUNTER — Ambulatory Visit (INDEPENDENT_AMBULATORY_CARE_PROVIDER_SITE_OTHER): Payer: PPO | Admitting: *Deleted

## 2018-06-06 DIAGNOSIS — D509 Iron deficiency anemia, unspecified: Secondary | ICD-10-CM | POA: Diagnosis not present

## 2018-06-06 DIAGNOSIS — I4891 Unspecified atrial fibrillation: Secondary | ICD-10-CM | POA: Diagnosis not present

## 2018-06-06 DIAGNOSIS — Z5181 Encounter for therapeutic drug level monitoring: Secondary | ICD-10-CM | POA: Diagnosis not present

## 2018-06-06 DIAGNOSIS — R809 Proteinuria, unspecified: Secondary | ICD-10-CM | POA: Diagnosis not present

## 2018-06-06 DIAGNOSIS — I10 Essential (primary) hypertension: Secondary | ICD-10-CM | POA: Diagnosis not present

## 2018-06-06 DIAGNOSIS — E559 Vitamin D deficiency, unspecified: Secondary | ICD-10-CM | POA: Diagnosis not present

## 2018-06-06 DIAGNOSIS — Z79899 Other long term (current) drug therapy: Secondary | ICD-10-CM | POA: Diagnosis not present

## 2018-06-06 DIAGNOSIS — N183 Chronic kidney disease, stage 3 (moderate): Secondary | ICD-10-CM | POA: Diagnosis not present

## 2018-06-06 LAB — POCT INR: INR: 3 (ref 2.0–3.0)

## 2018-06-06 NOTE — Patient Instructions (Signed)
Continue coumadin 1 tablet daily Recheck in 6 weeks 

## 2018-06-13 DIAGNOSIS — R809 Proteinuria, unspecified: Secondary | ICD-10-CM | POA: Diagnosis not present

## 2018-06-13 DIAGNOSIS — N182 Chronic kidney disease, stage 2 (mild): Secondary | ICD-10-CM | POA: Diagnosis not present

## 2018-06-13 DIAGNOSIS — E669 Obesity, unspecified: Secondary | ICD-10-CM | POA: Diagnosis not present

## 2018-06-20 ENCOUNTER — Telehealth (INDEPENDENT_AMBULATORY_CARE_PROVIDER_SITE_OTHER): Payer: Self-pay | Admitting: *Deleted

## 2018-06-20 NOTE — Telephone Encounter (Signed)
Patient on recall for 1 yr TCS, 2 polyps are tubular adenomas and one polyp is sessile serrated polyp.. Patient will return for repeat colonoscopy in one year following 2 day prep cause prep was suboptimal.  Patient call stating someone from office called and told him he didn't need another TCS -- please advise, all I see is repeat in 1 year

## 2018-06-24 NOTE — Telephone Encounter (Signed)
Call returned. Patient not home.  Talk with his wife Alex Maddox. I am not sure who called him.  There is nothing in his chart that says my office called him. He is doing well.  He can wait 1 more year if he wants to. Will call office tomorrow let us know.  Ann he will call you.

## 2018-06-27 DIAGNOSIS — Z1389 Encounter for screening for other disorder: Secondary | ICD-10-CM | POA: Diagnosis not present

## 2018-06-27 DIAGNOSIS — E663 Overweight: Secondary | ICD-10-CM | POA: Diagnosis not present

## 2018-06-27 DIAGNOSIS — Z6828 Body mass index (BMI) 28.0-28.9, adult: Secondary | ICD-10-CM | POA: Diagnosis not present

## 2018-06-27 DIAGNOSIS — F419 Anxiety disorder, unspecified: Secondary | ICD-10-CM | POA: Diagnosis not present

## 2018-06-27 DIAGNOSIS — Z23 Encounter for immunization: Secondary | ICD-10-CM | POA: Diagnosis not present

## 2018-06-28 ENCOUNTER — Emergency Department (HOSPITAL_COMMUNITY): Payer: PPO

## 2018-06-28 ENCOUNTER — Inpatient Hospital Stay (HOSPITAL_COMMUNITY)
Admission: EM | Admit: 2018-06-28 | Discharge: 2018-07-05 | DRG: 871 | Disposition: A | Discharge status: Home / Self Care | Payer: PPO | Attending: Family Medicine | Admitting: Family Medicine

## 2018-06-28 ENCOUNTER — Encounter (HOSPITAL_COMMUNITY): Payer: Self-pay

## 2018-06-28 DIAGNOSIS — R51 Headache: Secondary | ICD-10-CM | POA: Diagnosis not present

## 2018-06-28 DIAGNOSIS — F419 Anxiety disorder, unspecified: Secondary | ICD-10-CM | POA: Diagnosis present

## 2018-06-28 DIAGNOSIS — G4733 Obstructive sleep apnea (adult) (pediatric): Secondary | ICD-10-CM | POA: Diagnosis present

## 2018-06-28 DIAGNOSIS — G92 Toxic encephalopathy: Secondary | ICD-10-CM | POA: Diagnosis present

## 2018-06-28 DIAGNOSIS — Z87820 Personal history of traumatic brain injury: Secondary | ICD-10-CM

## 2018-06-28 DIAGNOSIS — Z8249 Family history of ischemic heart disease and other diseases of the circulatory system: Secondary | ICD-10-CM | POA: Diagnosis not present

## 2018-06-28 DIAGNOSIS — A879 Viral meningitis, unspecified: Secondary | ICD-10-CM | POA: Diagnosis present

## 2018-06-28 DIAGNOSIS — A419 Sepsis, unspecified organism: Secondary | ICD-10-CM | POA: Diagnosis present

## 2018-06-28 DIAGNOSIS — B999 Unspecified infectious disease: Secondary | ICD-10-CM | POA: Diagnosis present

## 2018-06-28 DIAGNOSIS — I10 Essential (primary) hypertension: Secondary | ICD-10-CM | POA: Diagnosis present

## 2018-06-28 DIAGNOSIS — I4891 Unspecified atrial fibrillation: Secondary | ICD-10-CM | POA: Diagnosis present

## 2018-06-28 DIAGNOSIS — I639 Cerebral infarction, unspecified: Secondary | ICD-10-CM | POA: Diagnosis not present

## 2018-06-28 DIAGNOSIS — N400 Enlarged prostate without lower urinary tract symptoms: Secondary | ICD-10-CM | POA: Diagnosis present

## 2018-06-28 DIAGNOSIS — G968 Other specified disorders of central nervous system: Secondary | ICD-10-CM

## 2018-06-28 DIAGNOSIS — H538 Other visual disturbances: Secondary | ICD-10-CM | POA: Diagnosis not present

## 2018-06-28 DIAGNOSIS — K5900 Constipation, unspecified: Secondary | ICD-10-CM | POA: Diagnosis not present

## 2018-06-28 DIAGNOSIS — G9389 Other specified disorders of brain: Secondary | ICD-10-CM | POA: Diagnosis present

## 2018-06-28 DIAGNOSIS — G042 Bacterial meningoencephalitis and meningomyelitis, not elsewhere classified: Secondary | ICD-10-CM | POA: Diagnosis present

## 2018-06-28 DIAGNOSIS — G4489 Other headache syndrome: Secondary | ICD-10-CM | POA: Diagnosis not present

## 2018-06-28 DIAGNOSIS — M1990 Unspecified osteoarthritis, unspecified site: Secondary | ICD-10-CM | POA: Diagnosis present

## 2018-06-28 DIAGNOSIS — Z950 Presence of cardiac pacemaker: Secondary | ICD-10-CM

## 2018-06-28 DIAGNOSIS — B004 Herpesviral encephalitis: Secondary | ICD-10-CM | POA: Diagnosis not present

## 2018-06-28 DIAGNOSIS — I714 Abdominal aortic aneurysm, without rupture: Secondary | ICD-10-CM | POA: Diagnosis present

## 2018-06-28 DIAGNOSIS — Z8673 Personal history of transient ischemic attack (TIA), and cerebral infarction without residual deficits: Secondary | ICD-10-CM

## 2018-06-28 DIAGNOSIS — R509 Fever, unspecified: Secondary | ICD-10-CM | POA: Diagnosis present

## 2018-06-28 DIAGNOSIS — Z7901 Long term (current) use of anticoagulants: Secondary | ICD-10-CM

## 2018-06-28 DIAGNOSIS — H9190 Unspecified hearing loss, unspecified ear: Secondary | ICD-10-CM | POA: Diagnosis not present

## 2018-06-28 DIAGNOSIS — R836 Abnormal cytological findings in cerebrospinal fluid: Secondary | ICD-10-CM | POA: Diagnosis not present

## 2018-06-28 DIAGNOSIS — R000 Tachycardia, unspecified: Secondary | ICD-10-CM | POA: Diagnosis not present

## 2018-06-28 DIAGNOSIS — D696 Thrombocytopenia, unspecified: Secondary | ICD-10-CM | POA: Diagnosis present

## 2018-06-28 DIAGNOSIS — R4182 Altered mental status, unspecified: Secondary | ICD-10-CM | POA: Diagnosis not present

## 2018-06-28 DIAGNOSIS — E785 Hyperlipidemia, unspecified: Secondary | ICD-10-CM | POA: Diagnosis present

## 2018-06-28 DIAGNOSIS — I4510 Unspecified right bundle-branch block: Secondary | ICD-10-CM | POA: Diagnosis not present

## 2018-06-28 DIAGNOSIS — Z79899 Other long term (current) drug therapy: Secondary | ICD-10-CM | POA: Diagnosis not present

## 2018-06-28 DIAGNOSIS — G039 Meningitis, unspecified: Secondary | ICD-10-CM | POA: Diagnosis not present

## 2018-06-28 DIAGNOSIS — I499 Cardiac arrhythmia, unspecified: Secondary | ICD-10-CM | POA: Diagnosis not present

## 2018-06-28 DIAGNOSIS — Z85828 Personal history of other malignant neoplasm of skin: Secondary | ICD-10-CM

## 2018-06-28 DIAGNOSIS — R52 Pain, unspecified: Secondary | ICD-10-CM | POA: Diagnosis not present

## 2018-06-28 DIAGNOSIS — G928 Other toxic encephalopathy: Secondary | ICD-10-CM | POA: Diagnosis present

## 2018-06-28 DIAGNOSIS — I480 Paroxysmal atrial fibrillation: Secondary | ICD-10-CM | POA: Diagnosis present

## 2018-06-28 DIAGNOSIS — Z5181 Encounter for therapeutic drug level monitoring: Secondary | ICD-10-CM

## 2018-06-28 LAB — COMPREHENSIVE METABOLIC PANEL
ALBUMIN: 4.1 g/dL (ref 3.5–5.0)
ALT: 39 U/L (ref 0–44)
ANION GAP: 8 (ref 5–15)
AST: 29 U/L (ref 15–41)
Alkaline Phosphatase: 53 U/L (ref 38–126)
BILIRUBIN TOTAL: 1.4 mg/dL — AB (ref 0.3–1.2)
BUN: 18 mg/dL (ref 8–23)
CO2: 26 mmol/L (ref 22–32)
Calcium: 9.6 mg/dL (ref 8.9–10.3)
Chloride: 98 mmol/L (ref 98–111)
Creatinine, Ser: 1.17 mg/dL (ref 0.61–1.24)
GFR calc Af Amer: >60 mL/min (ref >60)
GFR calc non Af Amer: >60 mL/min (ref >60)
GLUCOSE: 167 mg/dL — AB (ref 70–99)
POTASSIUM: 3.6 mmol/L (ref 3.5–5.1)
SODIUM: 132 mmol/L — AB (ref 135–145)
TOTAL PROTEIN: 7.5 g/dL (ref 6.5–8.1)

## 2018-06-28 LAB — PROTIME-INR
INR: 2.17
Prothrombin Time: 24 seconds — ABNORMAL HIGH (ref 11.4–15.2)

## 2018-06-28 LAB — URINALYSIS, ROUTINE W REFLEX MICROSCOPIC
BACTERIA UA: NONE SEEN
Bilirubin Urine: NEGATIVE
Glucose, UA: 150 mg/dL — AB
Hgb urine dipstick: NEGATIVE
Ketones, ur: 5 mg/dL — AB
Leukocytes, UA: NEGATIVE
Nitrite: NEGATIVE
PH: 7 (ref 5.0–8.0)
Protein, ur: 30 mg/dL — AB
Specific Gravity, Urine: 1.013 (ref 1.005–1.030)

## 2018-06-28 LAB — CBC WITH DIFFERENTIAL/PLATELET
BASOS ABS: 0 10*3/uL (ref 0.0–0.1)
Basophils Relative: 0 %
Eosinophils Absolute: 0 10*3/uL (ref 0.0–0.7)
Eosinophils Relative: 0 %
HEMATOCRIT: 45.5 % (ref 39.0–52.0)
Hemoglobin: 15.9 g/dL (ref 13.0–17.0)
LYMPHS PCT: 13 %
Lymphs Abs: 1.2 10*3/uL (ref 0.7–4.0)
MCH: 32.8 pg (ref 26.0–34.0)
MCHC: 34.9 g/dL (ref 30.0–36.0)
MCV: 93.8 fL (ref 78.0–100.0)
MONO ABS: 0.4 10*3/uL (ref 0.1–1.0)
Monocytes Relative: 5 %
NEUTROS ABS: 7.2 10*3/uL (ref 1.7–7.7)
Neutrophils Relative %: 82 %
Platelets: 123 10*3/uL — ABNORMAL LOW (ref 150–400)
RBC: 4.85 MIL/uL (ref 4.22–5.81)
RDW: 12.3 % (ref 11.5–15.5)
WBC: 8.8 10*3/uL (ref 4.0–10.5)

## 2018-06-28 LAB — CBG MONITORING, ED: GLUCOSE-CAPILLARY: 147 mg/dL — AB (ref 70–99)

## 2018-06-28 LAB — I-STAT CG4 LACTIC ACID, ED: LACTIC ACID, VENOUS: 2.19 mmol/L — AB (ref 0.5–1.9)

## 2018-06-28 MED ORDER — SODIUM CHLORIDE 0.9 % IV SOLN
INTRAVENOUS | Status: DC | PRN
  Administered 2018-06-28: 1000 mL via INTRAVENOUS

## 2018-06-28 MED ORDER — VANCOMYCIN HCL 1000 MG IV SOLR
INTRAVENOUS | Status: AC
  Filled 2018-06-28: qty 2000

## 2018-06-28 MED ORDER — ACETAMINOPHEN 500 MG PO TABS
1000.0000 mg | ORAL_TABLET | Freq: Once | ORAL | Status: AC
  Administered 2018-06-28: 1000 mg via ORAL
  Filled 2018-06-28: qty 2

## 2018-06-28 MED ORDER — VANCOMYCIN HCL 10 G IV SOLR
1750.0000 mg | Freq: Once | INTRAVENOUS | Status: AC
  Administered 2018-06-28: 1750 mg via INTRAVENOUS
  Filled 2018-06-28: qty 1750

## 2018-06-28 MED ORDER — LACTATED RINGERS IV BOLUS
1000.0000 mL | Freq: Once | INTRAVENOUS | Status: AC
  Administered 2018-06-28: 1000 mL via INTRAVENOUS

## 2018-06-28 MED ORDER — SODIUM CHLORIDE 0.9 % IV SOLN
2.0000 g | Freq: Once | INTRAVENOUS | Status: AC
  Administered 2018-06-28: 2 g via INTRAVENOUS
  Filled 2018-06-28: qty 2

## 2018-06-28 NOTE — ED Notes (Signed)
AC notified for Vanc.

## 2018-06-28 NOTE — ED Triage Notes (Signed)
Pt in by [facility] for headache and some altered mental status.  Pt is febrile 103.2 on arrival, states his headache has improved at this time.

## 2018-06-29 ENCOUNTER — Inpatient Hospital Stay (HOSPITAL_COMMUNITY)
Admit: 2018-06-29 | Discharge: 2018-06-29 | Disposition: A | Discharge status: Home / Self Care | Payer: PPO | Attending: Neurology | Admitting: Neurology

## 2018-06-29 ENCOUNTER — Other Ambulatory Visit: Payer: Self-pay

## 2018-06-29 ENCOUNTER — Encounter (HOSPITAL_COMMUNITY): Payer: Self-pay | Admitting: *Deleted

## 2018-06-29 ENCOUNTER — Inpatient Hospital Stay (HOSPITAL_COMMUNITY): Payer: PPO

## 2018-06-29 DIAGNOSIS — Z87820 Personal history of traumatic brain injury: Secondary | ICD-10-CM | POA: Diagnosis not present

## 2018-06-29 DIAGNOSIS — Z950 Presence of cardiac pacemaker: Secondary | ICD-10-CM | POA: Diagnosis not present

## 2018-06-29 DIAGNOSIS — D696 Thrombocytopenia, unspecified: Secondary | ICD-10-CM | POA: Diagnosis present

## 2018-06-29 DIAGNOSIS — Z8673 Personal history of transient ischemic attack (TIA), and cerebral infarction without residual deficits: Secondary | ICD-10-CM | POA: Diagnosis not present

## 2018-06-29 DIAGNOSIS — E785 Hyperlipidemia, unspecified: Secondary | ICD-10-CM | POA: Diagnosis present

## 2018-06-29 DIAGNOSIS — G92 Toxic encephalopathy: Secondary | ICD-10-CM | POA: Diagnosis present

## 2018-06-29 DIAGNOSIS — I10 Essential (primary) hypertension: Secondary | ICD-10-CM | POA: Diagnosis present

## 2018-06-29 DIAGNOSIS — G928 Other toxic encephalopathy: Secondary | ICD-10-CM | POA: Diagnosis present

## 2018-06-29 DIAGNOSIS — Z85828 Personal history of other malignant neoplasm of skin: Secondary | ICD-10-CM | POA: Diagnosis not present

## 2018-06-29 DIAGNOSIS — I639 Cerebral infarction, unspecified: Secondary | ICD-10-CM | POA: Diagnosis not present

## 2018-06-29 DIAGNOSIS — G042 Bacterial meningoencephalitis and meningomyelitis, not elsewhere classified: Secondary | ICD-10-CM | POA: Diagnosis present

## 2018-06-29 DIAGNOSIS — N400 Enlarged prostate without lower urinary tract symptoms: Secondary | ICD-10-CM | POA: Diagnosis present

## 2018-06-29 DIAGNOSIS — R509 Fever, unspecified: Secondary | ICD-10-CM | POA: Diagnosis present

## 2018-06-29 DIAGNOSIS — A419 Sepsis, unspecified organism: Principal | ICD-10-CM

## 2018-06-29 DIAGNOSIS — F419 Anxiety disorder, unspecified: Secondary | ICD-10-CM | POA: Diagnosis present

## 2018-06-29 DIAGNOSIS — R4182 Altered mental status, unspecified: Secondary | ICD-10-CM | POA: Diagnosis not present

## 2018-06-29 DIAGNOSIS — R000 Tachycardia, unspecified: Secondary | ICD-10-CM | POA: Diagnosis not present

## 2018-06-29 DIAGNOSIS — Z8249 Family history of ischemic heart disease and other diseases of the circulatory system: Secondary | ICD-10-CM | POA: Diagnosis not present

## 2018-06-29 DIAGNOSIS — M1990 Unspecified osteoarthritis, unspecified site: Secondary | ICD-10-CM | POA: Diagnosis present

## 2018-06-29 DIAGNOSIS — G4733 Obstructive sleep apnea (adult) (pediatric): Secondary | ICD-10-CM | POA: Diagnosis present

## 2018-06-29 DIAGNOSIS — Z79899 Other long term (current) drug therapy: Secondary | ICD-10-CM | POA: Diagnosis not present

## 2018-06-29 DIAGNOSIS — G968 Other specified disorders of central nervous system: Secondary | ICD-10-CM

## 2018-06-29 DIAGNOSIS — I480 Paroxysmal atrial fibrillation: Secondary | ICD-10-CM | POA: Diagnosis present

## 2018-06-29 DIAGNOSIS — G039 Meningitis, unspecified: Secondary | ICD-10-CM | POA: Diagnosis not present

## 2018-06-29 DIAGNOSIS — G9389 Other specified disorders of brain: Secondary | ICD-10-CM | POA: Diagnosis present

## 2018-06-29 DIAGNOSIS — Z7901 Long term (current) use of anticoagulants: Secondary | ICD-10-CM | POA: Diagnosis not present

## 2018-06-29 DIAGNOSIS — A879 Viral meningitis, unspecified: Secondary | ICD-10-CM | POA: Diagnosis present

## 2018-06-29 DIAGNOSIS — G9689 Other specified disorders of central nervous system: Secondary | ICD-10-CM | POA: Diagnosis present

## 2018-06-29 DIAGNOSIS — R51 Headache: Secondary | ICD-10-CM | POA: Diagnosis not present

## 2018-06-29 DIAGNOSIS — I714 Abdominal aortic aneurysm, without rupture: Secondary | ICD-10-CM | POA: Diagnosis present

## 2018-06-29 LAB — COMPREHENSIVE METABOLIC PANEL
ALBUMIN: 3.8 g/dL (ref 3.5–5.0)
ALT: 36 U/L (ref 0–44)
AST: 25 U/L (ref 15–41)
Alkaline Phosphatase: 48 U/L (ref 38–126)
Anion gap: 7 (ref 5–15)
BUN: 19 mg/dL (ref 8–23)
CHLORIDE: 103 mmol/L (ref 98–111)
CO2: 25 mmol/L (ref 22–32)
Calcium: 9.3 mg/dL (ref 8.9–10.3)
Creatinine, Ser: 1.05 mg/dL (ref 0.61–1.24)
GFR calc Af Amer: >60 mL/min (ref >60)
GLUCOSE: 117 mg/dL — AB (ref 70–99)
POTASSIUM: 3.5 mmol/L (ref 3.5–5.1)
Sodium: 135 mmol/L (ref 135–145)
Total Bilirubin: 1.6 mg/dL — ABNORMAL HIGH (ref 0.3–1.2)
Total Protein: 7 g/dL (ref 6.5–8.1)

## 2018-06-29 LAB — CBC
HCT: 44.2 % (ref 39.0–52.0)
Hemoglobin: 15.2 g/dL (ref 13.0–17.0)
MCH: 32.3 pg (ref 26.0–34.0)
MCHC: 34.4 g/dL (ref 30.0–36.0)
MCV: 94 fL (ref 78.0–100.0)
PLATELETS: 122 10*3/uL — AB (ref 150–400)
RBC: 4.7 MIL/uL (ref 4.22–5.81)
RDW: 12.3 % (ref 11.5–15.5)
WBC: 9.8 10*3/uL (ref 4.0–10.5)

## 2018-06-29 LAB — MRSA PCR SCREENING: MRSA by PCR: NEGATIVE

## 2018-06-29 LAB — I-STAT CG4 LACTIC ACID, ED: Lactic Acid, Venous: 1.44 mmol/L (ref 0.5–1.9)

## 2018-06-29 MED ORDER — IRBESARTAN 300 MG PO TABS
300.0000 mg | ORAL_TABLET | Freq: Every day | ORAL | Status: DC
  Administered 2018-06-30 – 2018-07-04 (×5): 300 mg via ORAL
  Filled 2018-06-29 (×7): qty 1

## 2018-06-29 MED ORDER — DEXTROSE 5 % IV SOLN
870.0000 mg | Freq: Three times a day (TID) | INTRAVENOUS | Status: DC
  Administered 2018-06-29 – 2018-07-02 (×9): 870 mg via INTRAVENOUS
  Filled 2018-06-29 (×17): qty 17.4

## 2018-06-29 MED ORDER — FINASTERIDE 5 MG PO TABS
5.0000 mg | ORAL_TABLET | Freq: Every day | ORAL | Status: DC
  Administered 2018-06-30 – 2018-07-05 (×6): 5 mg via ORAL
  Filled 2018-06-29 (×10): qty 1

## 2018-06-29 MED ORDER — ONDANSETRON HCL 4 MG PO TABS: 4.0000 mg | ORAL_TABLET | Freq: Four times a day (QID) | ORAL | Status: DC | PRN

## 2018-06-29 MED ORDER — LEVETIRACETAM IN NACL 500 MG/100ML IV SOLN
500.0000 mg | Freq: Two times a day (BID) | INTRAVENOUS | Status: DC
  Administered 2018-06-29 – 2018-07-04 (×10): 500 mg via INTRAVENOUS
  Filled 2018-06-29 (×11): qty 100

## 2018-06-29 MED ORDER — AMLODIPINE BESYLATE 5 MG PO TABS
5.0000 mg | ORAL_TABLET | Freq: Every day | ORAL | Status: DC
  Administered 2018-06-30 – 2018-07-04 (×5): 5 mg via ORAL
  Filled 2018-06-29 (×6): qty 1

## 2018-06-29 MED ORDER — HYDROCODONE-ACETAMINOPHEN 5-325 MG PO TABS
1.0000 | ORAL_TABLET | Freq: Once | ORAL | Status: AC
  Administered 2018-06-29: 1 via ORAL
  Filled 2018-06-29: qty 1

## 2018-06-29 MED ORDER — SIMVASTATIN 20 MG PO TABS: 40.0000 mg | ORAL_TABLET | Freq: Every day | ORAL | Status: DC

## 2018-06-29 MED ORDER — ONDANSETRON HCL 4 MG/2ML IJ SOLN: 4.0000 mg | Freq: Four times a day (QID) | INTRAMUSCULAR | Status: DC | PRN

## 2018-06-29 MED ORDER — HYDROCODONE-ACETAMINOPHEN 5-325 MG PO TABS: 1.0000 | ORAL_TABLET | Freq: Four times a day (QID) | ORAL | Status: DC | PRN

## 2018-06-29 MED ORDER — CARVEDILOL 12.5 MG PO TABS
12.5000 mg | ORAL_TABLET | Freq: Two times a day (BID) | ORAL | Status: DC
  Administered 2018-06-30 – 2018-07-05 (×11): 12.5 mg via ORAL
  Filled 2018-06-29 (×12): qty 1

## 2018-06-29 MED ORDER — AMLODIPINE BESYLATE 5 MG PO TABS
2.5000 mg | ORAL_TABLET | Freq: Every day | ORAL | Status: DC
  Filled 2018-06-29: qty 1

## 2018-06-29 MED ORDER — SODIUM CHLORIDE 0.9 % IV SOLN: 2.0000 g | Freq: Two times a day (BID) | INTRAVENOUS | Status: DC

## 2018-06-29 MED ORDER — SODIUM CHLORIDE 0.9 % IV SOLN
100.0000 mg | Freq: Two times a day (BID) | INTRAVENOUS | Status: DC
  Administered 2018-06-29 – 2018-07-03 (×8): 100 mg via INTRAVENOUS
  Filled 2018-06-29 (×14): qty 100

## 2018-06-29 MED ORDER — ACETAMINOPHEN 325 MG PO TABS
650.0000 mg | ORAL_TABLET | Freq: Four times a day (QID) | ORAL | Status: DC | PRN
  Administered 2018-06-30: 650 mg via ORAL
  Filled 2018-06-29: qty 2

## 2018-06-29 MED ORDER — ACETAMINOPHEN 650 MG RE SUPP
650.0000 mg | Freq: Four times a day (QID) | RECTAL | Status: DC | PRN
  Administered 2018-06-29 – 2018-06-30 (×2): 650 mg via RECTAL
  Filled 2018-06-29 (×2): qty 1

## 2018-06-29 MED ORDER — VANCOMYCIN HCL IN DEXTROSE 750-5 MG/150ML-% IV SOLN
750.0000 mg | Freq: Two times a day (BID) | INTRAVENOUS | Status: DC
  Administered 2018-06-29 – 2018-06-30 (×4): 750 mg via INTRAVENOUS
  Filled 2018-06-29 (×11): qty 150

## 2018-06-29 MED ORDER — IOPAMIDOL (ISOVUE-370) INJECTION 76%
75.0000 mL | Freq: Once | INTRAVENOUS | Status: AC | PRN
  Administered 2018-06-29: 75 mL via INTRAVENOUS

## 2018-06-29 MED ORDER — SODIUM CHLORIDE 0.9 % IV SOLN
2.0000 g | Freq: Two times a day (BID) | INTRAVENOUS | Status: DC
  Administered 2018-06-29: 2 g via INTRAVENOUS
  Filled 2018-06-29 (×7): qty 2

## 2018-06-29 MED ORDER — SODIUM CHLORIDE 0.9 % IV SOLN
INTRAVENOUS | Status: DC
  Administered 2018-06-29 – 2018-07-03 (×4): via INTRAVENOUS

## 2018-06-29 MED ORDER — SODIUM CHLORIDE 0.9 % IV SOLN
2.0000 g | Freq: Two times a day (BID) | INTRAVENOUS | Status: DC
  Administered 2018-06-29 – 2018-07-05 (×12): 2 g via INTRAVENOUS
  Filled 2018-06-29 (×13): qty 20
  Filled 2018-06-29: qty 2
  Filled 2018-06-29 (×2): qty 20
  Filled 2018-06-29: qty 2
  Filled 2018-06-29 (×2): qty 20

## 2018-06-29 NOTE — Progress Notes (Signed)
PT Cancellation Note  Patient Details Name: JAIVYN GULLA MRN: [id number] DOB: Jun 19, 1951   Cancelled Treatment:    Reason Eval/Treat Not Completed: Medical issues which prohibited therapy.  Attending MD requested to hold physical therapy today due to patient disorientated - RN notified.     10:55 AM, 06/29/18 Lonell Grandchild, MPT Physical Therapist with [facility] 336 [phone] office [phone] mobile phone

## 2018-06-29 NOTE — Procedures (Signed)
  Patrick A. Merlene Laughter, MD     [email or web address]           HISTORY: The patient is a 67 year old man who presents with fever and altered mental status.  The studies been done to evaluate for nonconvulsive seizures.  MEDICATIONS: Scheduled Meds: . amLODipine  5 mg Oral Daily  . carvedilol  12.5 mg Oral BID WC  . finasteride  5 mg Oral Daily  . irbesartan  300 mg Oral Daily   Continuous Infusions: . sodium chloride 75 mL/hr at 06/29/18 1615  . acyclovir 870 mg (06/29/18 1737)  . cefTRIAXone (ROCEPHIN)  IV 2 g (06/29/18 1616)  . doxycycline (VIBRAMYCIN) IV 100 mg (06/29/18 1739)  . vancomycin 750 mg (06/29/18 1119)   PRN Meds:.acetaminophen **OR** acetaminophen, ondansetron **OR** ondansetron (ZOFRAN) IV  Prior to Admission medications   Medication Sig Start Date End Date Taking? Authorizing Provider  amLODipine (NORVASC) 2.5 MG tablet Take 1 tablet (2.5 mg total) by mouth daily. 01/17/18  Yes Croitoru, Mihai, MD  carvedilol (COREG) 12.5 MG tablet TAKE ONE TABLET (12.5MG  TOTAL) BY MOUTH TWO TIMES DAILY WITH A MEAL 01/22/18  Yes Croitoru, Mihai, MD  diazepam (VALIUM) 2 MG tablet Take 2 mg by mouth every 6 (six) hours as needed for anxiety.   Yes [provider]  finasteride (PROSCAR) 5 MG tablet Take 5 mg by mouth daily.   Yes [provider]  fish oil-omega-3 fatty acids 1000 MG capsule Take 1 g by mouth daily.   Yes [provider]  irbesartan (AVAPRO) 300 MG tablet TAKE ONE (1) TABLET BY MOUTH EVERY DAY 12/12/17  Yes Croitoru, Mihai, MD  Multiple Vitamin (MULTIVITAMIN) tablet Take 1 tablet by mouth daily.   Yes [provider]  simvastatin (ZOCOR) 40 MG tablet TAKE ONE TABLET BY MOUTH DAILY AT 6:00PM 05/14/18  Yes Croitoru, Mihai, MD  warfarin (COUMADIN) 5 MG tablet Take one tablet by mouth daily as directed by coumadin clinic 03/29/18  Yes Croitoru, Mihai, MD      ANALYSIS: A 16 channel recording using standard 10 20  measurements is conducted for 22 minutes.  There is a well-formed posterior background activity of 9 Hz which attenuates with eye-opening.  However, the normal background activity is frequently interrupted with a replete theta and delta generalized slowing.  The delta slowing is most pronounced in the frontocentral regions bilaterally especially on the right side.  Additionally, there is a single sharp wave activity that phase reverses at F4.  There is beta activity observed in the frontal areas.  Awake and drowsy activities are observed.  Photic stimulation and hyperventilation are not conducted.  There is no focal or lateralized slowing.   IMPRESSION: 1.  Normal background activity is observed but frequently interrupted with generalized theta delta slowing most prominent involving the right frontocentral region. 2.  Single episode of right frontal central epileptiform discharges observed. 3.  Given the above, the patient will be started on Keppra.      Ludie Pavlik A. Merlene Laughter, M.D.  Diplomate, Tax adviser of Psychiatry and Neurology ( Neurology).

## 2018-06-29 NOTE — Care Management Important Message (Signed)
Important Message  Patient Details  Name: ZI [place]BURY MRN: [id number] Date of Birth: Oct 13, 1950   Medicare Important Message Given:  Yes    Shelda Altes 06/29/2018, 10:38 AM

## 2018-06-29 NOTE — Progress Notes (Signed)
New Admission Note:  Arrival Method: arrived from [facility]  Mental Orientation: oriented to self  Telemetry:3w31 Skin: red rash on both elbows bilaterally IV: L AC & R AC Safety Measures: Safety Fall Prevention Plan was given, discussed. 7C94: Patient has been orientated to the room, unit and the staff. Family: Wife at the bedside Orders have been reviewed and implemented. Will continue to monitor the patient. Call light has been placed within reach and bed alarm has been activated.   Arta Silence ,RN

## 2018-06-29 NOTE — Progress Notes (Signed)
EEG completed, results pending. 

## 2018-06-29 NOTE — Progress Notes (Signed)
ANTICOAGULATION CONSULT NOTE - Initial Consult  Pharmacy Consult for warfarin dosing Indication: atrial fibrillation  Goal range: 2-3 Home dose: warfarin 5mg  daily Last dose: 9/18 at 0900  No Known Allergies  Patient Measurements: Height: 6' (182.9 cm) Weight: 192 lb 14.4 oz (87.5 kg) IBW/kg (Calculated) : 77.6 Heparin Dosing Weight:    Vital Signs: Temp: 99.4 F (37.4 C) (09/20 0733) Temp Source: Oral (09/20 0733) BP: 167/99 (09/20 0700) Pulse Rate: 78 (09/20 0733)  Labs: Recent Labs    06/28/18 2148 06/29/18 0556  HGB 15.9 15.2  HCT 45.5 44.2  PLT 123* 122*  LABPROT 24.0*  --   INR 2.17  --   CREATININE 1.17 1.05    Estimated Creatinine Clearance: 76 mL/min (by C-G formula based on SCr of 1.05 mg/dL).   Medications:  Scheduled:  . amLODipine  2.5 mg Oral Daily  . carvedilol  12.5 mg Oral BID WC  . finasteride  5 mg Oral Daily  . irbesartan  300 mg Oral Daily  . simvastatin  40 mg Oral q1800    Assessment: Pharmacy consulted to dose warfarin for this 67 yo male on chronic warfarin therapy for atrial fibrillation.  Patient was on warfarin 5mg  daily at home (35mg /week.)    Goal of Therapy:  INR 2-3 Monitor platelets by anticoagulation protocol: Yes   Plan:  Give warfarin 5mg  today x1 dose Pharmacy will continue to monitor as appropriate.   Despina Pole, Pharm. D. Clinical Pharmacist 06/29/2018 11:18 AM

## 2018-06-29 NOTE — Consult Note (Signed)
Yoe A. Merlene Laughter, MD     [email or web address]          Alex Maddox is an 67 y.o. male.   ASSESSMENT/PLAN: 1.  Fever of unknown origin but given the headaches and altered mental status I am highly suspicious that the patient has primary central nervous system infection.  Particular areas of concerns includes herpes encephalitis and rickettsial infections.  The patient has been started on broad-spectrum antibiotics including meningitic doses of vancomycin and Rocephin.  Additionally, doxycycline and acyclovir has been initiated.  Patient should have a spinal tap to evaluate for these conditions.  Unfortunately because the patient is on anticoagulation this will have to be delayed until this could be done.  The patient is being transferred to the main campus at [facility] further treatment and evaluation.  2.  CT findings of remote left hemispheric infarct:    Patient is 67 year old white male who presents with relatively acute onset of headaches.  He was noted to have a fever of 103 in the hospital.  There may been some complaints of neck pain although this is uncertain.  Patient had a flu shot about 3 days ago and apparently had 3 episodes of syncope.  Therevwere apparently brief and of unclear etiology.  The patient has been worked up for other sources of infection including the lungs kidney and blood but so far this is been negative.  There are no reports of tick bites.  No seizures, focal neurological symptoms that she is focal weakness, numbness, dysarthria or dysphasia.  The review of system is limited given the reduced level of consciousness.   GENERAL: He is drowsy and appears to be in discomfort.  HEENT: No trauma appreciated and neck is actually supple.  ABDOMEN: soft  EXTREMITIES: No edema   BACK: Normal  SKIN: Normal by inspection.  No clear evidence of rash or other changes.    MENTAL STATUS: He is drowsy.  He does opens his eyes briefly to light  sternal rub.  He does follow commands briskly and consistently.  He knows that he is in the hospital although he is unable to state which hospital.  He is not oriented to time.  CRANIAL NERVES: Pupils are equal, round and reactive to light and accomodation; extra ocular movements are full, there is no significant nystagmus; visual fields are full; upper and lower facial muscles are normal in strength and symmetric, there is no flattening of the nasolabial folds; tongue is midline  MOTOR: The patient moves both upper extremities to commands and has about 4/5 strength.  The patient does withdrawal to pain in the legs with at least 3/5 strength.  Bulk and tone are normal throughout.  COORDINATION: No tremor, dysmetria or parkinsonism appreciated.  REFLEXES: Deep tendon reflexes are symmetrical and normal.   SENSATION: He does responds to painful stimuli bilaterally.    Blood pressure (!) 167/99, pulse 78, temperature (!) 103.5 F (39.7 C), temperature source Rectal, resp. rate 15, height 6' (1.829 m), weight 87.5 kg, SpO2 98 %.  Past Medical History:  Diagnosis Date  . Abnormal echocardiogram    stress myoview 10/29/2010  . Arthritis    " IN MY BACK "  . Dyslipidemia   . Dysrhythmia    Atrial fibrillation  . Hypertension   . Paroxysmal atrial fibrillation (HCC)   . Presence of permanent cardiac pacemaker   . Sinus node dysfunction (HCC)   . Sleep apnea, obstructive    sleep study 10/09/2007  AHI  5.73/hr  REM AHI 11.90/hr   . 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   LEFT THUMB   . Stroke ([place])   . Syncope    2D Echocardiogram 09/27/2010 EF between 40 to 45%  . Tachycardia-bradycardia ([place])   . Transient ischemic attack (TIA) 06/07    Past Surgical History:  Procedure Laterality Date  . COLONOSCOPY    . COLONOSCOPY N/A 06/07/2017   Procedure: COLONOSCOPY;  Surgeon: Rogene Houston, MD;  Location: [facility];  Service: Endoscopy;  Laterality: N/A;  9:30  .  ELECTROCARDIOGRAM    . INSERT / REPLACE / REMOVE PACEMAKER  05/2011  . LOOP RECORDER IMPLANT  02/02/2011   [employer]tr[employer]- CRM   . PACEMAKER INSERTION  05/30/2011   Medtronic Revo   last checked 11/07/2012    Family History  Problem Relation Age of Onset  . Hypertension Mother   . Heart attack Father 50       deceased from heart attack  . Hypertension Brother   . Colon cancer Neg Hx     Social History:  reports that he has never smoked. He has never used smokeless tobacco. He reports that he drinks alcohol. He reports that he does not use drugs.  Allergies: No Known Allergies  Medications: Prior to Admission medications   Medication Sig Start Date End Date Taking? Authorizing Provider  amLODipine (NORVASC) 2.5 MG tablet Take 1 tablet (2.5 mg total) by mouth daily. 01/17/18  Yes Croitoru, Mihai, MD  carvedilol (COREG) 12.5 MG tablet TAKE ONE TABLET (12.5MG TOTAL) BY MOUTH TWO TIMES DAILY WITH A MEAL 01/22/18  Yes Croitoru, Mihai, MD  diazepam (VALIUM) 2 MG tablet Take 2 mg by mouth every 6 (six) hours as needed for anxiety.   Yes [provider]  finasteride (PROSCAR) 5 MG tablet Take 5 mg by mouth daily.   Yes [provider]  fish oil-omega-3 fatty acids 1000 MG capsule Take 1 g by mouth daily.   Yes [provider]  irbesartan (AVAPRO) 300 MG tablet TAKE ONE (1) TABLET BY MOUTH EVERY DAY 12/12/17  Yes Croitoru, Mihai, MD  Multiple Vitamin (MULTIVITAMIN) tablet Take 1 tablet by mouth daily.   Yes [provider]  simvastatin (ZOCOR) 40 MG tablet TAKE ONE TABLET BY MOUTH DAILY AT 6:00PM 05/14/18  Yes Croitoru, Mihai, MD  warfarin (COUMADIN) 5 MG tablet Take one tablet by mouth daily as directed by coumadin clinic 03/29/18  Yes Croitoru, Mihai, MD    Scheduled Meds: . amLODipine  5 mg Oral Daily  . carvedilol  12.5 mg Oral BID WC  . finasteride  5 mg Oral Daily  . irbesartan  300 mg Oral Daily   Continuous Infusions: . sodium chloride 75 mL/hr at  06/29/18 0443  . acyclovir    . cefTRIAXone (ROCEPHIN)  IV    . doxycycline (VIBRAMYCIN) IV    . vancomycin 750 mg (06/29/18 1119)   PRN Meds:.acetaminophen **OR** acetaminophen, ondansetron **OR** ondansetron (ZOFRAN) IV     Results for orders placed or performed during the hospital encounter of 06/28/18 (from the past 48 hour(s))  CBG monitoring, ED     Status: Abnormal   Collection Time: 06/28/18  9:22 PM  Result Value Ref Range   Glucose-Capillary 147 (H) 70 - 99 mg/dL  Comprehensive metabolic panel     Status: Abnormal   Collection Time: 06/28/18  9:48 PM  Result Value Ref Range   Sodium 132 (L) 135 - 145 mmol/L  Potassium 3.6 3.5 - 5.1 mmol/L   Chloride 98 98 - 111 mmol/L   CO2 26 22 - 32 mmol/L   Glucose, Bld 167 (H) 70 - 99 mg/dL   BUN 18 8 - 23 mg/dL   Creatinine, Ser 1.17 0.61 - 1.24 mg/dL   Calcium 9.6 8.9 - 10.3 mg/dL   Total Protein 7.5 6.5 - 8.1 g/dL   Albumin 4.1 3.5 - 5.0 g/dL   AST 29 15 - 41 U/L   ALT 39 0 - 44 U/L   Alkaline Phosphatase 53 38 - 126 U/L   Total Bilirubin 1.4 (H) 0.3 - 1.2 mg/dL   GFR calc non Af Amer >60 >60 mL/min   GFR calc Af Amer >60 >60 mL/min    Comment: (NOTE) The eGFR has been calculated using the CKD EPI equation. This calculation has not been validated in all clinical situations. eGFR's persistently <60 mL/min signify possible Chronic Kidney Disease.    Anion gap 8 5 - 15    Comment: Performed at [facility], [address], [place], [place] [address]  CBC WITH DIFFERENTIAL     Status: Abnormal   Collection Time: 06/28/18  9:48 PM  Result Value Ref Range   WBC 8.8 4.0 - 10.5 K/uL   RBC 4.85 4.22 - 5.81 MIL/uL   Hemoglobin 15.9 13.0 - 17.0 g/dL   HCT 45.5 39.0 - 52.0 %   MCV 93.8 78.0 - 100.0 fL   MCH 32.8 26.0 - 34.0 pg   MCHC 34.9 30.0 - 36.0 g/dL   RDW 12.3 11.5 - 15.5 %   Platelets 123 (L) 150 - 400 K/uL   Neutrophils Relative % 82 %   Neutro Abs 7.2 1.7 - 7.7 K/uL   Lymphocytes Relative 13 %   Lymphs Abs 1.2  0.7 - 4.0 K/uL   Monocytes Relative 5 %   Monocytes Absolute 0.4 0.1 - 1.0 K/uL   Eosinophils Relative 0 %   Eosinophils Absolute 0.0 0.0 - 0.7 K/uL   Basophils Relative 0 %   Basophils Absolute 0.0 0.0 - 0.1 K/uL    Comment: Performed at [facility], [address], [place], [place] [address]  Blood Culture (routine x 2)     Status: None (Preliminary result)   Collection Time: 06/28/18  9:48 PM  Result Value Ref Range   Specimen Description BLOOD LEFT HAND    Special Requests      BOTTLES DRAWN AEROBIC AND ANAEROBIC Blood Culture adequate volume   Culture      NO GROWTH < 12 HOURS Performed at [facility], [address], [place], [place] [address]    Report Status PENDING   Protime-INR     Status: Abnormal   Collection Time: 06/28/18  9:48 PM  Result Value Ref Range   Prothrombin Time 24.0 (H) 11.4 - 15.2 seconds   INR 2.17     Comment: Performed at [facility], [address], [place], [place] [address]  Blood Culture (routine x 2)     Status: None (Preliminary result)   Collection Time: 06/28/18  9:53 PM  Result Value Ref Range   Specimen Description BLOOD RIGHT FOREARM    Special Requests      Blood Culture adequate volume BOTTLES DRAWN AEROBIC AND ANAEROBIC   Culture      NO GROWTH < 12 HOURS Performed at [facility], [address] [place], [place] [address]    Report Status PENDING   I-Stat CG4 Lactic Acid, ED  (  not at  [facility])     Status: Abnormal   Collection Time: 06/28/18 10:02 PM  Result Value Ref Range   Lactic Acid, Venous 2.19 (HH) 0.5 - 1.9 mmol/L   Comment NOTIFIED PHYSICIAN   Urinalysis, Routine w reflex microscopic     Status: Abnormal   Collection Time: 06/28/18 11:10 PM  Result Value Ref Range   Color, Urine YELLOW YELLOW   APPearance HAZY (A) CLEAR   Specific Gravity, Urine 1.013 1.005 - 1.030   pH 7.0 5.0 - 8.0   Glucose, UA 150 (A) NEGATIVE mg/dL   Hgb urine dipstick NEGATIVE NEGATIVE   Bilirubin Urine NEGATIVE NEGATIVE   Ketones, ur 5 (A)  NEGATIVE mg/dL   Protein, ur 30 (A) NEGATIVE mg/dL   Nitrite NEGATIVE NEGATIVE   Leukocytes, UA NEGATIVE NEGATIVE   RBC / HPF 0-5 0 - 5 RBC/hpf   WBC, UA 0-5 0 - 5 WBC/hpf   Bacteria, UA NONE SEEN NONE SEEN   Mucus PRESENT     Comment: Performed at [facility], [address] [place], [place] [address]  I-Stat CG4 Lactic Acid, ED  (not at  [facility])     Status: None   Collection Time: 06/29/18 12:51 AM  Result Value Ref Range   Lactic Acid, Venous 1.44 0.5 - 1.9 mmol/L  MRSA PCR Screening     Status: None   Collection Time: 06/29/18  3:58 AM  Result Value Ref Range   MRSA by PCR NEGATIVE NEGATIVE    Comment:        The GeneXpert MRSA Assay (FDA approved for NASAL specimens only), is one component of a comprehensive MRSA colonization surveillance program. It is not intended to diagnose MRSA infection nor to guide or monitor treatment for MRSA infections. Performed at [facility], [address], [place], [place] [address]   CBC     Status: Abnormal   Collection Time: 06/29/18  5:56 AM  Result Value Ref Range   WBC 9.8 4.0 - 10.5 K/uL   RBC 4.70 4.22 - 5.81 MIL/uL   Hemoglobin 15.2 13.0 - 17.0 g/dL   HCT 44.2 39.0 - 52.0 %   MCV 94.0 78.0 - 100.0 fL   MCH 32.3 26.0 - 34.0 pg   MCHC 34.4 30.0 - 36.0 g/dL   RDW 12.3 11.5 - 15.5 %   Platelets 122 (L) 150 - 400 K/uL    Comment: Performed at [facility], [address] [place], [place] [address]  Comprehensive metabolic panel     Status: Abnormal   Collection Time: 06/29/18  5:56 AM  Result Value Ref Range   Sodium 135 135 - 145 mmol/L   Potassium 3.5 3.5 - 5.1 mmol/L   Chloride 103 98 - 111 mmol/L   CO2 25 22 - 32 mmol/L   Glucose, Bld 117 (H) 70 - 99 mg/dL   BUN 19 8 - 23 mg/dL   Creatinine, Ser 1.05 0.61 - 1.24 mg/dL   Calcium 9.3 8.9 - 10.3 mg/dL   Total Protein 7.0 6.5 - 8.1 g/dL   Albumin 3.8 3.5 - 5.0 g/dL   AST 25 15 - 41 U/L   ALT 36 0 - 44 U/L   Alkaline Phosphatase 48 38 - 126 U/L   Total Bilirubin 1.6 (H)  0.3 - 1.2 mg/dL   GFR calc non Af Amer >60 >60 mL/min   GFR calc Af Amer >60 >60 mL/min    Comment: (NOTE) The eGFR has been calculated using the CKD  EPI equation. This calculation has not been validated in all clinical situations. eGFR's persistently <60 mL/min signify possible Chronic Kidney Disease.    Anion gap 7 5 - 15    Comment: Performed at [facility], [address] [place], [place] [address]    Studies/Results: HEAD CT FINDINGS: Brain: Diffuse cerebral atrophy. Mild ventricular dilatation consistent with central atrophy. Low-attenuation changes in the deep white matter consistent small vessel ischemia. Since the previous study, there is interval development of asymmetric low-attenuation change in the left internal capsule region. This could indicate acute or subacute ischemia. Consider MRI for further evaluation if clinically suspicious. No mass-effect or midline shift. No abnormal extra-axial fluid collections. Gray-white matter junctions are distinct. Basal cisterns are not effaced. No acute intracranial hemorrhage.  Vascular: Moderate intracranial arterial vascular calcifications are present.  Skull: Focal thinning of the calvarium in the left temporal region, unchanged since previous study. Significance is uncertain. No expansile or destructive bone lesions are identified. No depressed skull fractures.  Sinuses/Orbits: Paranasal sinuses and mastoid air cells are clear.  Other: None.  IMPRESSION: 1. Interval development of asymmetric low-attenuation change in the left internal capsule region. This could indicate acute or subacute ischemia. Consider MRI for further evaluation if clinically suspicious. 2. No acute intracranial hemorrhage or mass effect. 3. Chronic atrophy and small vessel ischemic changes.     HEAD NECK CTA FINDINGS: CTA NECK FINDINGS  Aortic arch: Standard branching. Imaged portion shows no evidence of aneurysm or dissection.  No significant stenosis of the major arch vessel origins.  Right carotid system: No evidence of dissection, stenosis (50% or greater) or occlusion. Nonstenotic calcific plaque at the bifurcation.  Left carotid system: No evidence of dissection, stenosis (50% or greater) or occlusion. Nonstenotic calcific plaque at the bifurcation.  Vertebral arteries: LEFT vertebral dominant. Both are patent. No ostial narrowing or stenosis in the neck.  Skeleton: No worrisome osseous lesion. C5-6 ACDF appears well-healed.  Other neck: No neck masses.  Upper chest: No pneumothorax or mass.  Review of the MIP images confirms the above findings  CTA HEAD FINDINGS  Anterior circulation: No significant stenosis, proximal occlusion, aneurysm, or vascular malformation. Nonstenotic calcific atheromatous change in the cavernous internal carotid arteries bilaterally.  Posterior circulation: No significant stenosis, proximal occlusion, aneurysm, or vascular malformation. Minor atheromatous change distal LEFT vertebral.  Venous sinuses: As permitted by contrast timing, patent.  Anatomic variants: None of significance.  Delayed phase: No abnormal intracranial enhancement.  Review of the MIP images confirms the above findings  IMPRESSION: No intracranial or extracranial flow-limiting stenosis, occlusion, or dissection.  Calcific nonstenotic atrial and intracranial atherosclerotic disease as described.  No abnormal intracranial enhancement. Asymmetric LEFT anterior internal capsule/basal ganglia hypodensity remains of indeterminate age, but likely represents chronic ischemia which has developed since December of 2018.     The head CT scan is reviewed in person.  There is moderate global atrophy.  There is marked periventricular deep white matter hypodensity.  There is asymmetric hypodensity involving the left frontal and parietal regions with some evidence of  encephalomalacia worrisome for remote infarct.  CT of the head neck shows no significant occlusive disease.     Izaha Shughart A. Merlene Laughter, M.D.  Diplomate, Tax adviser of Psychiatry and Neurology ( Neurology). 06/29/2018, 2:41 PM

## 2018-06-29 NOTE — H&P (Signed)
TRH H&P    Patient Demographics:    Alex Maddox, is a 67 y.o. male  MRN: [id number]  DOB - 1951/05/16  Admit Date - 06/28/2018  Referring MD/NP/PA: Dr. Dayna Barker  Outpatient Primary MD for the patient is Redmond School, MD  Patient coming from: Home  Chief complaint-headache   HPI:    Alex Maddox  is a 67 y.o. male, with history of atrial fibrillation on chronic anticoagulation with Coumadin, syncope, TIA, hypertension came to ED with chief complaint of headache which started this morning.  Patient does have a history of recurrent syncope, has a pacemaker in place.  Patient got flu shot on Wednesday and also had 2 episodes of syncope 3 days ago as per patient's wife.  He denies coughing up any phlegm.  No shortness of breath.  Denies chest pain.  Denies dysuria, urgency or frequency of urination. In the ED patient was found to be febrile with temperature 103.2.  Denies neck pain or stiffness.  No previous history of migraines.  Does complain of mild photophobia.  CT of the head showed interval development of asymmetric low-attenuation change in the left internal capsule region this could indicate acute or subacute ischemia. Patient was empirically started on vancomycin and cefepime for possible sepsis. He is on anticoagulation with Coumadin, INR is therapeutic at 2.17.    Review of systems:    In addition to the HPI above,    All other systems reviewed and are negative.   With Past History of the following :    Past Medical History:  Diagnosis Date  . Abnormal echocardiogram    stress myoview 10/29/2010  . Arthritis    " IN MY BACK "  . Dyslipidemia   . Dysrhythmia    Atrial fibrillation  . Hypertension   . Paroxysmal atrial fibrillation (HCC)   . Presence of permanent cardiac pacemaker   . Sinus node dysfunction (HCC)   . Sleep apnea, obstructive    sleep study 10/09/2007  AHI 5.73/hr  REM AHI  11.90/hr   . 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   LEFT THUMB   . Stroke ([place])   . Syncope    2D Echocardiogram 09/27/2010 EF between 40 to 45%  . Tachycardia-bradycardia ([place])   . Transient ischemic attack (TIA) 06/07      Past Surgical History:  Procedure Laterality Date  . COLONOSCOPY    . COLONOSCOPY N/A 06/07/2017   Procedure: COLONOSCOPY;  Surgeon: Rogene Houston, MD;  Location: [facility];  Service: Endoscopy;  Laterality: N/A;  9:30  . ELECTROCARDIOGRAM    . INSERT / REPLACE / REMOVE PACEMAKER  05/2011  . LOOP RECORDER IMPLANT  02/02/2011   [employer]tr[employer]- CRM   . PACEMAKER INSERTION  05/30/2011   Medtronic Revo   last checked 11/07/2012      Social History:      Social History   Tobacco Use  . Smoking status: Never Smoker  . Smokeless tobacco: Never Used  Substance Use Topics  . Alcohol use: Yes  Comment: occasionally       Family History :     Family History  Problem Relation Age of Onset  . Hypertension Mother   . Heart attack Father 26       deceased from heart attack  . Hypertension Brother   . Colon cancer Neg Hx       Home Medications:   Prior to Admission medications   Medication Sig Start Date End Date Taking? Authorizing Provider  amLODipine (NORVASC) 2.5 MG tablet Take 1 tablet (2.5 mg total) by mouth daily. 01/17/18   Croitoru, Mihai, MD  carvedilol (COREG) 12.5 MG tablet TAKE ONE TABLET (12.'5MG'$  TOTAL) BY MOUTH TWO TIMES DAILY WITH A MEAL 01/22/18   Croitoru, Mihai, MD  diazepam (VALIUM) 2 MG tablet Take 2 mg by mouth every 6 (six) hours as needed for anxiety.    [provider]  finasteride (PROSCAR) 5 MG tablet Take 5 mg by mouth daily.    [provider]  fish oil-omega-3 fatty acids 1000 MG capsule Take 1 g by mouth daily.    [provider]  irbesartan (AVAPRO) 300 MG tablet TAKE ONE (1) TABLET BY MOUTH EVERY DAY 12/12/17   Croitoru, Mihai, MD  Misc Natural Products (PROSTATE HEALTH PO) Take 2  capsules by mouth every morning.    [provider]  Multiple Vitamin (MULTIVITAMIN) tablet Take 1 tablet by mouth daily.    [provider]  Saw Palmetto 450 MG CAPS Take 3 capsules by mouth daily.    [provider]  simvastatin (ZOCOR) 40 MG tablet TAKE ONE TABLET BY MOUTH DAILY AT 6:00PM 05/14/18   Croitoru, Mihai, MD  warfarin (COUMADIN) 5 MG tablet Take one tablet by mouth daily as directed by coumadin clinic 03/29/18   Croitoru, Mihai, MD     Allergies:    No Known Allergies   Physical Exam:   Vitals  Blood pressure 136/76, pulse 82, temperature 100.2 F (37.9 C), temperature source Oral, resp. rate 18, height 6' (1.829 m), weight 90.7 kg, SpO2 96 %.  1.  General: Somnolent but arousable  2. Psychiatric:  Intact judgement and  insight,  oriented x 3.  3. Neurologic: No focal neurological deficits, all cranial nerves intact.Strength 5/5 all 4 extremities, sensation intact all 4 extremities, plantars down going.  4. Eyes :  anicteric sclerae, moist conjunctivae with no lid lag. PERRLA.  5. ENMT:  Oropharynx clear with moist mucous membranes and good dentition  6. Neck:  supple, no neck stiffness  7. Respiratory : Normal respiratory effort, good air movement bilaterally,clear to  auscultation bilaterally  8. Cardiovascular : RRR, no gallops, rubs or murmurs, no leg edema  9. Gastrointestinal:  Positive bowel sounds, abdomen soft, non-tender to palpation,no hepatosplenomegaly, no rigidity or guarding       10. Skin:  No cyanosis, normal texture and turgor, no rash, lesions or ulcers  11.Musculoskeletal:  Good muscle tone,  joints appear normal , no effusions,  normal range of motion    Data Review:    CBC Recent Labs  Lab 06/28/18 2148  WBC 8.8  HGB 15.9  HCT 45.5  PLT 123*  MCV 93.8  MCH 32.8  MCHC 34.9  RDW 12.3  LYMPHSABS 1.2  MONOABS 0.4  EOSABS 0.0  BASOSABS 0.0    ------------------------------------------------------------------------------------------------------------------  Results for orders placed or performed during the hospital encounter of 06/28/18 (from the past 48 hour(s))  CBG monitoring, ED     Status: Abnormal   Collection  Time: 06/28/18  9:22 PM  Result Value Ref Range   Glucose-Capillary 147 (H) 70 - 99 mg/dL  Comprehensive metabolic panel     Status: Abnormal   Collection Time: 06/28/18  9:48 PM  Result Value Ref Range   Sodium 132 (L) 135 - 145 mmol/L   Potassium 3.6 3.5 - 5.1 mmol/L   Chloride 98 98 - 111 mmol/L   CO2 26 22 - 32 mmol/L   Glucose, Bld 167 (H) 70 - 99 mg/dL   BUN 18 8 - 23 mg/dL   Creatinine, Ser 1.17 0.61 - 1.24 mg/dL   Calcium 9.6 8.9 - 10.3 mg/dL   Total Protein 7.5 6.5 - 8.1 g/dL   Albumin 4.1 3.5 - 5.0 g/dL   AST 29 15 - 41 U/L   ALT 39 0 - 44 U/L   Alkaline Phosphatase 53 38 - 126 U/L   Total Bilirubin 1.4 (H) 0.3 - 1.2 mg/dL   GFR calc non Af Amer >60 >60 mL/min   GFR calc Af Amer >60 >60 mL/min    Comment: (NOTE) The eGFR has been calculated using the CKD EPI equation. This calculation has not been validated in all clinical situations. eGFR's persistently <60 mL/min signify possible Chronic Kidney Disease.    Anion gap 8 5 - 15    Comment: Performed at [facility], [address] [place], [place] [address]  CBC WITH DIFFERENTIAL     Status: Abnormal   Collection Time: 06/28/18  9:48 PM  Result Value Ref Range   WBC 8.8 4.0 - 10.5 K/uL   RBC 4.85 4.22 - 5.81 MIL/uL   Hemoglobin 15.9 13.0 - 17.0 g/dL   HCT 45.5 39.0 - 52.0 %   MCV 93.8 78.0 - 100.0 fL   MCH 32.8 26.0 - 34.0 pg   MCHC 34.9 30.0 - 36.0 g/dL   RDW 12.3 11.5 - 15.5 %   Platelets 123 (L) 150 - 400 K/uL   Neutrophils Relative % 82 %   Neutro Abs 7.2 1.7 - 7.7 K/uL   Lymphocytes Relative 13 %   Lymphs Abs 1.2 0.7 - 4.0 K/uL   Monocytes Relative 5 %   Monocytes Absolute 0.4 0.1 - 1.0 K/uL   Eosinophils Relative 0 %    Eosinophils Absolute 0.0 0.0 - 0.7 K/uL   Basophils Relative 0 %   Basophils Absolute 0.0 0.0 - 0.1 K/uL    Comment: Performed at [facility], [address], [place], [place] [address]  Blood Culture (routine x 2)     Status: None (Preliminary result)   Collection Time: 06/28/18  9:48 PM  Result Value Ref Range   Specimen Description BLOOD LEFT HAND    Special Requests      BOTTLES DRAWN AEROBIC AND ANAEROBIC Blood Culture adequate volume Performed at [facility], [address], [place], [place] [address]    Culture PENDING    Report Status PENDING   Protime-INR     Status: Abnormal   Collection Time: 06/28/18  9:48 PM  Result Value Ref Range   Prothrombin Time 24.0 (H) 11.4 - 15.2 seconds   INR 2.17     Comment: Performed at [facility], [address], [place], [place] [address]  Blood Culture (routine x 2)     Status: None (Preliminary result)   Collection Time: 06/28/18  9:53 PM  Result Value Ref Range   Specimen Description BLOOD RIGHT FOREARM    Special Requests      Blood Culture  adequate volume BOTTLES DRAWN AEROBIC AND ANAEROBIC Performed at [facility], [address] [place], [place] [address]    Culture PENDING    Report Status PENDING   I-Stat CG4 Lactic Acid, ED  (not at  [facility])     Status: Abnormal   Collection Time: 06/28/18 10:02 PM  Result Value Ref Range   Lactic Acid, Venous 2.19 (HH) 0.5 - 1.9 mmol/L   Comment NOTIFIED PHYSICIAN   Urinalysis, Routine w reflex microscopic     Status: Abnormal   Collection Time: 06/28/18 11:10 PM  Result Value Ref Range   Color, Urine YELLOW YELLOW   APPearance HAZY (A) CLEAR   Specific Gravity, Urine 1.013 1.005 - 1.030   pH 7.0 5.0 - 8.0   Glucose, UA 150 (A) NEGATIVE mg/dL   Hgb urine dipstick NEGATIVE NEGATIVE   Bilirubin Urine NEGATIVE NEGATIVE   Ketones, ur 5 (A) NEGATIVE mg/dL   Protein, ur 30 (A) NEGATIVE mg/dL   Nitrite NEGATIVE NEGATIVE   Leukocytes, UA NEGATIVE NEGATIVE   RBC / HPF 0-5 0 - 5 RBC/hpf    WBC, UA 0-5 0 - 5 WBC/hpf   Bacteria, UA NONE SEEN NONE SEEN   Mucus PRESENT     Comment: Performed at [facility], [address], [place], [place] [address]  I-Stat CG4 Lactic Acid, ED  (not at  [facility])     Status: None   Collection Time: 06/29/18 12:51 AM  Result Value Ref Range   Lactic Acid, Venous 1.44 0.5 - 1.9 mmol/L    Chemistries  Recent Labs  Lab 06/28/18 2148  NA 132*  K 3.6  CL 98  CO2 26  GLUCOSE 167*  BUN 18  CREATININE 1.17  CALCIUM 9.6  AST 29  ALT 39  ALKPHOS 53  BILITOT 1.4*   ------------------------------------------------------------------------------------------------------------------  ------------------------------------------------------------------------------------------------------------------ GFR: Estimated Creatinine Clearance: 68.2 mL/min (by C-G formula based on SCr of 1.17 mg/dL). Liver Function Tests: Recent Labs  Lab 06/28/18 2148  AST 29  ALT 39  ALKPHOS 53  BILITOT 1.4*  PROT 7.5  ALBUMIN 4.1   No results for input(s): LIPASE, AMYLASE in the last 168 hours. No results for input(s): AMMONIA in the last 168 hours. Coagulation Profile: Recent Labs  Lab 06/28/18 2148  INR 2.17   Cardiac Enzymes: No results for input(s): CKTOTAL, CKMB, CKMBINDEX, TROPONINI in the last 168 hours. BNP (last 3 results) No results for input(s): PROBNP in the last 8760 hours. HbA1C: No results for input(s): HGBA1C in the last 72 hours. CBG: Recent Labs  Lab 06/28/18 2122  GLUCAP 147*   Lipid Profile: No results for input(s): CHOL, HDL, LDLCALC, TRIG, CHOLHDL, LDLDIRECT in the last 72 hours. Thyroid Function Tests: No results for input(s): TSH, T4TOTAL, FREET4, T3FREE, THYROIDAB in the last 72 hours. Anemia Panel: No results for input(s): VITAMINB12, FOLATE, FERRITIN, TIBC, IRON, RETICCTPCT in the last 72 hours.  --------------------------------------------------------------------------------------------------------------- Urine  analysis:    Component Value Date/Time   COLORURINE YELLOW 06/28/2018 2310   APPEARANCEUR HAZY (A) 06/28/2018 2310   LABSPEC 1.013 06/28/2018 2310   PHURINE 7.0 06/28/2018 2310   GLUCOSEU 150 (A) 06/28/2018 2310   HGBUR NEGATIVE 06/28/2018 2310   BILIRUBINUR NEGATIVE 06/28/2018 2310   KETONESUR 5 (A) 06/28/2018 2310   PROTEINUR 30 (A) 06/28/2018 2310   UROBILINOGEN 2.0 (H) 03/18/2009 1704   NITRITE NEGATIVE 06/28/2018 2310   LEUKOCYTESUR NEGATIVE 06/28/2018 2310      Imaging Results:    Dg Chest 2 View  Result  Date: 06/28/2018 CLINICAL DATA:  Headache and altered mental status. Fever of 103. History of hypertension, atrial fibrillation, pacemaker. EXAM: CHEST - 2 VIEW COMPARISON:  09/12/2017 FINDINGS: Cardiac pacemaker. Shallow inspiration. Mild cardiac enlargement. No vascular congestion, edema, or consolidation. No blunting of costophrenic angles. No pneumothorax. Mediastinal contours appear intact. Postoperative changes in the cervical spine. IMPRESSION: Mild cardiac enlargement. No evidence of active pulmonary disease. Electronically Signed   By: Lucienne Capers M.D.   On: 06/28/2018 22:33   Ct Head Wo Contrast  Result Date: 06/28/2018 CLINICAL DATA:  Headache and altered mental status. Fever of 103. normal neurological examination. EXAM: CT HEAD WITHOUT CONTRAST TECHNIQUE: Contiguous axial images were obtained from the base of the skull through the vertex without intravenous contrast. COMPARISON:  09/12/2017 FINDINGS: Brain: Diffuse cerebral atrophy. Mild ventricular dilatation consistent with central atrophy. Low-attenuation changes in the deep white matter consistent small vessel ischemia. Since the previous study, there is interval development of asymmetric low-attenuation change in the left internal capsule region. This could indicate acute or subacute ischemia. Consider MRI for further evaluation if clinically suspicious. No mass-effect or midline shift. No abnormal extra-axial  fluid collections. Gray-white matter junctions are distinct. Basal cisterns are not effaced. No acute intracranial hemorrhage. Vascular: Moderate intracranial arterial vascular calcifications are present. Skull: Focal thinning of the calvarium in the left temporal region, unchanged since previous study. Significance is uncertain. No expansile or destructive bone lesions are identified. No depressed skull fractures. Sinuses/Orbits: Paranasal sinuses and mastoid air cells are clear. Other: None. IMPRESSION: 1. Interval development of asymmetric low-attenuation change in the left internal capsule region. This could indicate acute or subacute ischemia. Consider MRI for further evaluation if clinically suspicious. 2. No acute intracranial hemorrhage or mass effect. 3. Chronic atrophy and small vessel ischemic changes. Electronically Signed   By: Lucienne Capers M.D.   On: 06/28/2018 22:39    My personal review of EKG: Rhythm NSR, with right bundle branch block   Assessment & Plan:    Active Problems:   Sepsis ([place])   1. Sepsis-unclear etiology, patient presented with high fever, no clear source identified at this time.  UA is clear, chest x-ray shows no infiltrate.  Blood cultures x2 have been obtained.  Continue empiric antibiotics vancomycin and cefepime.  2. Headache-improving, he has no neck stiffness, CT head shows acute or subacute ischemia in the left internal capsule region.  Unfortunately MRI cannot be obtained as patient has a pacemaker.  Will consult neurology for further recommendations in a.m.  3. Hypertension-continue amlodipine, Coreg, Avapro.  4. Atrial fibrillation- CHA2DS2VASc score is 5, continue anticoagulation with Coumadin     DVT Prophylaxis-   Coumadin  AM Labs Ordered, also please review Full Orders  Family Communication: Admission, patients condition and plan of care including tests being ordered have been discussed with the patient and his wife at bedside who indicate  understanding and agree with the plan and Code Status.  Code Status: Full code  Admission status: Inpatient  Time spent in minutes : 60 minutes   Oswald Hillock M.D on 06/29/2018 at 3:18 AM  Between 7am to 7pm - Pager - [phone]. After 7pm go to [email or web address] - password [facility]  [facility] - Office  [phone]

## 2018-06-29 NOTE — Progress Notes (Addendum)
Patient Demographics:    Alex Maddox, is a 67 y.o. male, DOB - 1950-12-04, KTG:[id number]  Admit date - 06/28/2018   Admitting Physician Oswald Hillock, MD  Outpatient Primary MD for the patient is Redmond School, MD  LOS - 0   Chief Complaint  Patient presents with  . Headache  . Fever        Subjective:    [name] today has , no emesis,  No chest pain, remains very confused, disoriented, lethargic, unable to carry on a conversation, not really following commands, he cannot even understand simple instructions  Assessment  & Plan :    Principal Problem:   Acute Toxic metabolic encephalopathy due to infection Active Problems:   Sepsis ([place])   Suspected CNS infection   Atrial fibrillation ([place])   Long term current use of anticoagulant therapy   Pacemaker - Medtronic REVO dual-chamber implanted August 2012   HTN (hypertension)   Thrombocytopenia ([facility])   Fever in adult >> 68  Brief Summary 67 year old with past medical history relevant for atrial fibrillation on chronic anticoagulation, history of recurrent orthostatic syncope, history of prior TIA, admitted on 06/29/2018 with suspicion of possible stroke versus CNS infection .  Initial CT head suggested possible stroke, however CTA head and CTA neck without acute strokes, unable to do MRI due to pacemaker in situ. Patient is Alex Maddox [place] woods [place] presented with fevers up to 103.5, headaches, malaise, fatigue and confusion/disorientation--suspicion for CNS infection (viral meningitis versus rickettsial infection)-transferred to [facility] please consult neurology interventional radiology and infectious disease when patient arrives at [facility].  Patient needs lumbar puncture however he is on Coumadin.  Repeat INR pending  Plan:- 1)Possible Meningitis/possible encephalitis-discussed with on-call neurologist Dr. Merlene Laughter, patient had  headaches, fever up to 103.5, at baseline is usually very coherent and conversant, at baseline patient drives, balances his checkbooks, completely independent with ADLs, here in the hospital he is lethargic, disoriented, confused- empirically started on vancomycin, Rocephin and acyclovir in meningitic doses , doxycycline added for possible rickettsial infection.  Patient needs lumbar puncture, however he is on Coumadin, repeat INR pending.  Transferred to [facility] for further evaluation, patient will need neurology input, interventional radiology for LP, and infectious disease consult,, UA does not support UTI, blood cultures are pending. RMSF, Lyme and Ehrlichia antibody panel are pending  2) acute toxic metabolic encephalopathy--- secondary to presumed CNS infection, please see #1 above at baseline is usually very coherent and conversant, at baseline patient drives, balances his checkbooks, completely independent with ADLs.  Remains very confused, disoriented, lethargic, unable to carry on a conversation, not really following commands, cannot even understand simple instructions which according to patient's wife is very very different from his baseline until 24 hours ago, CTA head and CTA neck negative for acute strokes.  UA does not suggest UTI, blood cultures are pending  3)H/o chronic atrial fibrillation--- continue Coreg 12.5 g twice daily for rate control, hold anticoagulation to allow for possible lumbar puncture, patient with history of prior TIAs, wife verbalizes understanding of risk of stroke/VT with discontinuation of anticoagulation  4)HTN--- BP is not at goal, CTA head and CTA neck negative for acute stroke, increase amlodipine to 5 mg  daily, continue Coreg 12.5 mg twice daily, give Avapro 300 mg daily   Disposition/Need for in-Hospital Stay- patient unable to be discharged at this time due to significant acute toxic metabolic encephalopathy with significant difference from  patient's mental status at baseline, need to rule out possible CNS infection, blood cultures and other diagnostic studies are pending, patient needs IV antibiotics pending further culture and study data.   Code Status : FULL   Disposition Plan  : Transfer to [facility]   Consults  :  Will need ID/Neurology/IR consults   DVT Prophylaxis  :  SCDs (was on Coumadin at home)  Lab Results  Component Value Date   PLT 122 (L) 06/29/2018    Inpatient Medications  Scheduled Meds: . amLODipine  5 mg Oral Daily  . carvedilol  12.5 mg Oral BID WC  . finasteride  5 mg Oral Daily  . irbesartan  300 mg Oral Daily   Continuous Infusions: . sodium chloride 75 mL/hr at 06/29/18 0443  . acyclovir    . cefTRIAXone (ROCEPHIN)  IV    . doxycycline (VIBRAMYCIN) IV    . vancomycin 750 mg (06/29/18 1119)   PRN Meds:.acetaminophen **OR** acetaminophen, ondansetron **OR** ondansetron (ZOFRAN) IV    Anti-infectives (From admission, onward)   Start     Dose/Rate Route Frequency Ordered Stop   06/29/18 1600  doxycycline (VIBRAMYCIN) 100 mg in sodium chloride 0.9 % 250 mL IVPB     100 mg 125 mL/hr over 120 Minutes Intravenous Every 12 hours 06/29/18 1409     06/29/18 1500  acyclovir (ZOVIRAX) 870 mg in dextrose 5 % 150 mL IVPB     870 mg 167.4 mL/hr over 60 Minutes Intravenous Every 8 hours 06/29/18 1401     06/29/18 1430  cefTRIAXone (ROCEPHIN) 2 g in sodium chloride 0.9 % 100 mL IVPB  Status:  Discontinued     2 g 200 mL/hr over 30 Minutes Intravenous Every 12 hours 06/29/18 1416 06/29/18 1416   06/29/18 1400  cefTRIAXone (ROCEPHIN) 2 g in sodium chloride 0.9 % 100 mL IVPB     2 g 200 mL/hr over 30 Minutes Intravenous Every 12 hours 06/29/18 1355     06/29/18 1100  ceFEPIme (MAXIPIME) 2 g in sodium chloride 0.9 % 100 mL IVPB  Status:  Discontinued     2 g 200 mL/hr over 30 Minutes Intravenous Every 12 hours 06/29/18 0908 06/29/18 1354   06/29/18 1000  vancomycin (VANCOCIN) IVPB 750 mg/150 ml premix       750 mg 150 mL/hr over 60 Minutes Intravenous Every 12 hours 06/29/18 0908     06/28/18 2230  vancomycin (VANCOCIN) 1,750 mg in sodium chloride 0.9 % 500 mL IVPB     1,750 mg 250 mL/hr over 120 Minutes Intravenous  Once 06/28/18 2217 06/29/18 0157   06/28/18 2230  ceFEPIme (MAXIPIME) 2 g in sodium chloride 0.9 % 100 mL IVPB     2 g 200 mL/hr over 30 Minutes Intravenous  Once 06/28/18 2217 06/28/18 2319        Objective:   Vitals:   06/29/18 0600 06/29/18 0700 06/29/18 0733 06/29/18 1431  BP: (!) 160/100 (!) 167/99    Pulse: 74 75 78   Resp: 13 13 15    Temp:   99.4 F (37.4 C) (!) 103.5 F (39.7 C)  TempSrc:   Oral Rectal  SpO2: 96% 97% 98%   Weight:      Height:        Wt  Readings from Last 3 Encounters:  06/29/18 87.5 kg  01/17/18 89.4 kg  11/13/17 90.7 kg     Intake/Output Summary (Last 24 hours) at 06/29/2018 1442 Last data filed at 06/29/2018 1400 Gross per 24 hour  Intake 1399.87 ml  Output 1000 ml  Net 399.87 ml     Physical Exam Patient is examined daily including today on 06/29/18 , exams remain the same as of yesterday except that has changed   Gen:-Sleepy /lethargy HEENT:- [place].AT, No sclera icterus Neck-Supple Neck, no meningismus Lungs-  CTAB , good air movement CV- S1, S2 normal, right subclavian pacemaker in situ Abd-  +ve B.Sounds, Abd Soft, No tenderness, no CVA area tenderness Extremity/Skin:- No  edema,   good pulses, no significant rash, no ticks found on skin exam Psych-affect is confused/flat , disoriented, unable to answer simple questions  neuro-no new focal deficits, no tremors, very sleepy/lethargic, Kenning and Brudzinski signs are negative, no meningismus   Data Review:   Micro Results Recent Results (from the past 240 hour(s))  Blood Culture (routine x 2)     Status: None (Preliminary result)   Collection Time: 06/28/18  9:48 PM  Result Value Ref Range Status   Specimen Description BLOOD LEFT HAND  Final   Special Requests    Final    BOTTLES DRAWN AEROBIC AND ANAEROBIC Blood Culture adequate volume   Culture   Final    NO GROWTH < 12 HOURS Performed at [facility], [address], [place], [place] [address]    Report Status PENDING  Incomplete  Blood Culture (routine x 2)     Status: None (Preliminary result)   Collection Time: 06/28/18  9:53 PM  Result Value Ref Range Status   Specimen Description BLOOD RIGHT FOREARM  Final   Special Requests   Final    Blood Culture adequate volume BOTTLES DRAWN AEROBIC AND ANAEROBIC   Culture   Final    NO GROWTH < 12 HOURS Performed at [facility], [address] [place], [place] [address]    Report Status PENDING  Incomplete  MRSA PCR Screening     Status: None   Collection Time: 06/29/18  3:58 AM  Result Value Ref Range Status   MRSA by PCR NEGATIVE NEGATIVE Final    Comment:        The GeneXpert MRSA Assay (FDA approved for NASAL specimens only), is one component of a comprehensive MRSA colonization surveillance program. It is not intended to diagnose MRSA infection nor to guide or monitor treatment for MRSA infections. Performed at [facility], [address] [place],  [address]     Radiology Reports Ct Angio Head W Or Wo Contrast  Result Date: 06/29/2018 CLINICAL DATA:  Headache, fever, altered mental status. EXAM: CT ANGIOGRAPHY HEAD AND NECK TECHNIQUE: Multidetector CT imaging of the head and neck was performed using the standard protocol during bolus administration of intravenous contrast. Multiplanar CT image reconstructions and MIPs were obtained to evaluate the vascular anatomy. Carotid stenosis measurements (when applicable) are obtained utilizing NASCET criteria, using the distal internal carotid diameter as the denominator. CONTRAST:  41mL ISOVUE-370 IOPAMIDOL (ISOVUE-370) INJECTION 76% COMPARISON:  CT head 06/28/2018. CT head 09/12/2017. FINDINGS: CTA NECK FINDINGS Aortic arch: Standard branching. Imaged portion shows no evidence of  aneurysm or dissection. No significant stenosis of the major arch vessel origins. Right carotid system: No evidence of dissection, stenosis (50% or greater) or occlusion. Nonstenotic calcific plaque at the bifurcation. Left carotid system: No evidence of dissection,  stenosis (50% or greater) or occlusion. Nonstenotic calcific plaque at the bifurcation. Vertebral arteries: LEFT vertebral dominant. Both are patent. No ostial narrowing or stenosis in the neck. Skeleton: No worrisome osseous lesion. C5-6 ACDF appears well-healed. Other neck: No neck masses. Upper chest: No pneumothorax or mass. Review of the MIP images confirms the above findings CTA HEAD FINDINGS Anterior circulation: No significant stenosis, proximal occlusion, aneurysm, or vascular malformation. Nonstenotic calcific atheromatous change in the cavernous internal carotid arteries bilaterally. Posterior circulation: No significant stenosis, proximal occlusion, aneurysm, or vascular malformation. Minor atheromatous change distal LEFT vertebral. Venous sinuses: As permitted by contrast timing, patent. Anatomic variants: None of significance. Delayed phase: No abnormal intracranial enhancement. Review of the MIP images confirms the above findings IMPRESSION: No intracranial or extracranial flow-limiting stenosis, occlusion, or dissection. Calcific nonstenotic atrial and intracranial atherosclerotic disease as described. No abnormal intracranial enhancement. Asymmetric LEFT anterior internal capsule/basal ganglia hypodensity remains of indeterminate age, but likely represents chronic ischemia which has developed since December of 2018. Electronically Signed   By: Staci Righter M.D.   On: 06/29/2018 12:17   Dg Chest 2 View  Result Date: 06/28/2018 CLINICAL DATA:  Headache and altered mental status. Fever of 103. History of hypertension, atrial fibrillation, pacemaker. EXAM: CHEST - 2 VIEW COMPARISON:  09/12/2017 FINDINGS: Cardiac pacemaker. Shallow  inspiration. Mild cardiac enlargement. No vascular congestion, edema, or consolidation. No blunting of costophrenic angles. No pneumothorax. Mediastinal contours appear intact. Postoperative changes in the cervical spine. IMPRESSION: Mild cardiac enlargement. No evidence of active pulmonary disease. Electronically Signed   By: Lucienne Capers M.D.   On: 06/28/2018 22:33   Ct Head Wo Contrast  Result Date: 06/28/2018 CLINICAL DATA:  Headache and altered mental status. Fever of 103. normal neurological examination. EXAM: CT HEAD WITHOUT CONTRAST TECHNIQUE: Contiguous axial images were obtained from the base of the skull through the vertex without intravenous contrast. COMPARISON:  09/12/2017 FINDINGS: Brain: Diffuse cerebral atrophy. Mild ventricular dilatation consistent with central atrophy. Low-attenuation changes in the deep white matter consistent small vessel ischemia. Since the previous study, there is interval development of asymmetric low-attenuation change in the left internal capsule region. This could indicate acute or subacute ischemia. Consider MRI for further evaluation if clinically suspicious. No mass-effect or midline shift. No abnormal extra-axial fluid collections. Gray-white matter junctions are distinct. Basal cisterns are not effaced. No acute intracranial hemorrhage. Vascular: Moderate intracranial arterial vascular calcifications are present. Skull: Focal thinning of the calvarium in the left temporal region, unchanged since previous study. Significance is uncertain. No expansile or destructive bone lesions are identified. No depressed skull fractures. Sinuses/Orbits: Paranasal sinuses and mastoid air cells are clear. Other: None. IMPRESSION: 1. Interval development of asymmetric low-attenuation change in the left internal capsule region. This could indicate acute or subacute ischemia. Consider MRI for further evaluation if clinically suspicious. 2. No acute intracranial hemorrhage or  mass effect. 3. Chronic atrophy and small vessel ischemic changes. Electronically Signed   By: Lucienne Capers M.D.   On: 06/28/2018 22:39   Ct Angio Neck W Or Wo Contrast  Result Date: 06/29/2018 CLINICAL DATA:  Headache, fever, altered mental status. EXAM: CT ANGIOGRAPHY HEAD AND NECK TECHNIQUE: Multidetector CT imaging of the head and neck was performed using the standard protocol during bolus administration of intravenous contrast. Multiplanar CT image reconstructions and MIPs were obtained to evaluate the vascular anatomy. Carotid stenosis measurements (when applicable) are obtained utilizing NASCET criteria, using the distal internal carotid diameter as the denominator. CONTRAST:  76mL  ISOVUE-370 IOPAMIDOL (ISOVUE-370) INJECTION 76% COMPARISON:  CT head 06/28/2018. CT head 09/12/2017. FINDINGS: CTA NECK FINDINGS Aortic arch: Standard branching. Imaged portion shows no evidence of aneurysm or dissection. No significant stenosis of the major arch vessel origins. Right carotid system: No evidence of dissection, stenosis (50% or greater) or occlusion. Nonstenotic calcific plaque at the bifurcation. Left carotid system: No evidence of dissection, stenosis (50% or greater) or occlusion. Nonstenotic calcific plaque at the bifurcation. Vertebral arteries: LEFT vertebral dominant. Both are patent. No ostial narrowing or stenosis in the neck. Skeleton: No worrisome osseous lesion. C5-6 ACDF appears well-healed. Other neck: No neck masses. Upper chest: No pneumothorax or mass. Review of the MIP images confirms the above findings CTA HEAD FINDINGS Anterior circulation: No significant stenosis, proximal occlusion, aneurysm, or vascular malformation. Nonstenotic calcific atheromatous change in the cavernous internal carotid arteries bilaterally. Posterior circulation: No significant stenosis, proximal occlusion, aneurysm, or vascular malformation. Minor atheromatous change distal LEFT vertebral. Venous sinuses: As  permitted by contrast timing, patent. Anatomic variants: None of significance. Delayed phase: No abnormal intracranial enhancement. Review of the MIP images confirms the above findings IMPRESSION: No intracranial or extracranial flow-limiting stenosis, occlusion, or dissection. Calcific nonstenotic atrial and intracranial atherosclerotic disease as described. No abnormal intracranial enhancement. Asymmetric LEFT anterior internal capsule/basal ganglia hypodensity remains of indeterminate age, but likely represents chronic ischemia which has developed since December of 2018. Electronically Signed   By: Staci Righter M.D.   On: 06/29/2018 12:17     CBC Recent Labs  Lab 06/28/18 2148 06/29/18 0556  WBC 8.8 9.8  HGB 15.9 15.2  HCT 45.5 44.2  PLT 123* 122*  MCV 93.8 94.0  MCH 32.8 32.3  MCHC 34.9 34.4  RDW 12.3 12.3  LYMPHSABS 1.2  --   MONOABS 0.4  --   EOSABS 0.0  --   BASOSABS 0.0  --     Chemistries  Recent Labs  Lab 06/28/18 2148 06/29/18 0556  NA 132* 135  K 3.6 3.5  CL 98 103  CO2 26 25  GLUCOSE 167* 117*  BUN 18 19  CREATININE 1.17 1.05  CALCIUM 9.6 9.3  AST 29 25  ALT 39 36  ALKPHOS 53 48  BILITOT 1.4* 1.6*   ------------------------------------------------------------------------------------------------------------------ No results for input(s): CHOL, HDL, LDLCALC, TRIG, CHOLHDL, LDLDIRECT in the last 72 hours.  No results found for: HGBA1C ------------------------------------------------------------------------------------------------------------------ No results for input(s): TSH, T4TOTAL, T3FREE, THYROIDAB in the last 72 hours.  Invalid input(s): FREET3 ------------------------------------------------------------------------------------------------------------------ No results for input(s): VITAMINB12, FOLATE, FERRITIN, TIBC, IRON, RETICCTPCT in the last 72 hours.  Coagulation profile Recent Labs  Lab 06/28/18 2148  INR 2.17    No results for  input(s): DDIMER in the last 72 hours.  Cardiac Enzymes No results for input(s): CKMB, TROPONINI, MYOGLOBIN in the last 168 hours.  Invalid input(s): CK ------------------------------------------------------------------------------------------------------------------ No results found for: BNP   Roxan Hockey M.D on 06/29/2018 at 2:42 PM  Pager---[phone] Go to [email or web address] - password [email or web address] for contact info  [facility] - Office  [phone]

## 2018-06-29 NOTE — Progress Notes (Signed)
C/o lower abdominal pain and urgency. Bladder scan 327ml urine. Inserted foley and urine return after 1 hour around 64ml. Notified Dr. Denton Brick.

## 2018-06-29 NOTE — Progress Notes (Addendum)
Pharmacy Antibiotic Note  Alex Maddox is a 67 y.o. male admitted on 06/28/2018 with sepsis  and possible meningitis.  Pharmacy has been consulted for vancomycin, ceftriaxone and acyclovir dosing.  Plan: Start ceftriaxone 2g IV q12h Start acyclovir 780mg  IV q8h Loading dose:  vancomycin 1.75g IV x1 dose Maintenance dose:  vancomycin 750mg  IV q12h Goal trough range:  15-20   mcg/mL Pharmacy will continue to monitor renal function, vancomycin troughs, cultures and patient progress.    Height: 6' (182.9 cm) Weight: 192 lb 14.4 oz (87.5 kg) IBW/kg (Calculated) : 77.6  Temp (24hrs), Avg:100.4 F (38 C), Min:99.4 F (37.4 C), Max:103.2 F (39.6 C)  Recent Labs  Lab 06/28/18 2148 06/28/18 2202 06/29/18 0051 06/29/18 0556  WBC 8.8  --   --  9.8  CREATININE 1.17  --   --  1.05  LATICACIDVEN  --  2.19* 1.44  --     Estimated Creatinine Clearance: 76 mL/min (by C-G formula based on SCr of 1.05 mg/dL).    No Known Allergies  Antimicrobials this admission: cefepime 9/19  >> 9/20 vancomycin 9/19 >>   Acyclovir 9/20>> ceftriaxone 9/20>>  Microbiology results: 9/19 BCx2: ng<12h 9/20  MRSA PCR: negative  Thank you for allowing pharmacy to be a part of this patient's care.  Despina Pole, Pharm. D. Clinical Pharmacist 06/29/2018 2:08 PM

## 2018-06-29 NOTE — ED Provider Notes (Signed)
[facility] PROGRESSIVE CARE Provider Note   CSN: [id number] Arrival date & time: 06/28/18  2113     History   Chief Complaint Chief Complaint  Patient presents with  . Headache  . Fever    HPI Alex Maddox is a 67 y.o. male.  Patient with a headache that started this morning and subsequently started having some altered mental status and confusion.  He then developed a fever so brought here for further evaluation.  Take is now improving and almost gone.  Still has a fever.  Is no longer confused.  No neck pain or stiffness.   Headache   This is a new problem. The current episode started 6 to 12 hours ago. The problem occurs constantly. The problem has been gradually improving. The headache is associated with nothing. The quality of the pain is described as sharp and dull. The pain does not radiate. Associated symptoms include a fever.  Fever   Associated symptoms include headaches.    Past Medical History:  Diagnosis Date  . Abnormal echocardiogram    stress myoview 10/29/2010  . Arthritis    " IN MY BACK "  . Dyslipidemia   . Dysrhythmia    Atrial fibrillation  . Hypertension   . Paroxysmal atrial fibrillation (HCC)   . Presence of permanent cardiac pacemaker   . Sinus node dysfunction (HCC)   . Sleep apnea, obstructive    sleep study 10/09/2007  AHI 5.73/hr  REM AHI 11.90/hr   . 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   LEFT THUMB   . Stroke ([place])   . Syncope    2D Echocardiogram 09/27/2010 EF between 40 to 45%  . Tachycardia-bradycardia ([place])   . Transient ischemic attack (TIA) 06/07    Patient Active Problem List   Diagnosis Date Noted  . Sepsis ([place]) 06/29/2018  . Fever in adult >> 103 06/29/2018  . Acute Toxic metabolic encephalopathy due to infection 06/29/2018  . Suspected CNS infection 06/29/2018  . Thrombocytopenia ([place]) 09/25/2017  . Guaiac + stool 05/15/2017  . Left ventricular dysfunction 12/30/2015  . Coronary artery calcification  seen on CT scan 09/08/2014  . Ascending aortic aneurysm ([place]) 09/08/2014  . AAA (abdominal aortic aneurysm) without rupture ([place]) 09/08/2014  . History of TIA (transient ischemic attack) 09/08/2014  . Rib fractures   . Head injury   . Fall   . ICH (intracerebral hemorrhage) ([place])   . HLD (hyperlipidemia)   . Essential hypertension   . Syncope 08/13/2014  . Intracranial hemorrhage ([place]) 08/13/2014  . Anticoagulated on Coumadin   . Left rib fracture 08/12/2014  . Encounter for therapeutic drug monitoring 11/07/2013  . Neurocardiogenic syncope 08/26/2013  . Pacemaker - Medtronic REVO dual-chamber implanted August 2012 08/26/2013  . HTN (hypertension) 08/26/2013  . Hyperlipidemia 08/26/2013  . Erectile dysfunction 08/26/2013  . Atrial fibrillation ([place]) 12/14/2012  . Long term current use of anticoagulant therapy 12/14/2012    Past Surgical History:  Procedure Laterality Date  . COLONOSCOPY    . COLONOSCOPY N/A 06/07/2017   Procedure: COLONOSCOPY;  Surgeon: Rogene Houston, MD;  Location: [facility];  Service: Endoscopy;  Laterality: N/A;  9:30  . ELECTROCARDIOGRAM    . INSERT / REPLACE / REMOVE PACEMAKER  05/2011  . LOOP RECORDER IMPLANT  02/02/2011   Medtronic- CRM   . PACEMAKER INSERTION  05/30/2011   Medtronic Revo   last checked 11/07/2012        Home Medications  Prior to Admission medications   Medication Sig Start Date End Date Taking? Authorizing Provider  amLODipine (NORVASC) 2.5 MG tablet Take 1 tablet (2.5 mg total) by mouth daily. 01/17/18  Yes Croitoru, Mihai, MD  carvedilol (COREG) 12.5 MG tablet TAKE ONE TABLET (12.5MG  TOTAL) BY MOUTH TWO TIMES DAILY WITH A MEAL 01/22/18  Yes Croitoru, Mihai, MD  diazepam (VALIUM) 2 MG tablet Take 2 mg by mouth every 6 (six) hours as needed for anxiety.   Yes [provider]  finasteride (PROSCAR) 5 MG tablet Take 5 mg by mouth daily.   Yes [provider]  fish oil-omega-3 fatty acids 1000 MG capsule  Take 1 g by mouth daily.   Yes [provider]  irbesartan (AVAPRO) 300 MG tablet TAKE ONE (1) TABLET BY MOUTH EVERY DAY 12/12/17  Yes Croitoru, Mihai, MD  Multiple Vitamin (MULTIVITAMIN) tablet Take 1 tablet by mouth daily.   Yes [provider]  simvastatin (ZOCOR) 40 MG tablet TAKE ONE TABLET BY MOUTH DAILY AT 6:00PM 05/14/18  Yes Croitoru, Mihai, MD  warfarin (COUMADIN) 5 MG tablet Take one tablet by mouth daily as directed by coumadin clinic 03/29/18  Yes Croitoru, Mihai, MD    Family History Family History  Problem Relation Age of Onset  . Hypertension Mother   . Heart attack Father 92       deceased from heart attack  . Hypertension Brother   . Colon cancer Neg Hx     Social History Social History   Tobacco Use  . Smoking status: Never Smoker  . Smokeless tobacco: Never Used  Substance Use Topics  . Alcohol use: Yes    Comment: occasionally  . Drug use: No     Allergies   Patient has no known allergies.   Review of Systems Review of Systems  Constitutional: Positive for fever.  Neurological: Positive for headaches.  All other systems reviewed and are negative.    Physical Exam Updated Vital Signs BP (!) 167/99   Pulse 76   Temp (!) 100.8 F (38.2 C) (Oral)   Resp (!) 21   Ht 6' (1.829 m)   Wt 87.5 kg   SpO2 94%   BMI 26.16 kg/m   Physical Exam  Constitutional: He is oriented to person, place, and time. He appears well-developed and well-nourished.  HENT:  Head: Normocephalic and atraumatic.  Eyes: Pupils are equal, round, and reactive to light. EOM are normal. Right eye exhibits normal extraocular motion. Left eye exhibits normal extraocular motion.  Neck: Normal range of motion.  Cardiovascular: Normal rate.  Pulmonary/Chest: Effort normal. No respiratory distress.  Abdominal: He exhibits no distension.  Musculoskeletal: Normal range of motion.  Neurological: He is alert and oriented to person, place, and time. He has normal  strength. He is not disoriented. He displays normal reflexes. No cranial nerve deficit or sensory deficit. Coordination normal.  Skin: Skin is warm and dry.  Nursing note and vitals reviewed.    ED Treatments / Results  Labs (all labs ordered are listed, but only abnormal results are displayed) Labs Reviewed  COMPREHENSIVE METABOLIC PANEL - Abnormal; Notable for the following components:      Result Value   Sodium 132 (*)    Glucose, Bld 167 (*)    Total Bilirubin 1.4 (*)    All other components within normal limits  CBC WITH DIFFERENTIAL/PLATELET - Abnormal; Notable for the following components:   Platelets 123 (*)    All other components within normal limits  URINALYSIS, ROUTINE W REFLEX MICROSCOPIC - Abnormal; Notable for the following components:   APPearance HAZY (*)    Glucose, UA 150 (*)    Ketones, ur 5 (*)    Protein, ur 30 (*)    All other components within normal limits  PROTIME-INR - Abnormal; Notable for the following components:   Prothrombin Time 24.0 (*)    All other components within normal limits  CBC - Abnormal; Notable for the following components:   Platelets 122 (*)    All other components within normal limits  COMPREHENSIVE METABOLIC PANEL - Abnormal; Notable for the following components:   Glucose, Bld 117 (*)    Total Bilirubin 1.6 (*)    All other components within normal limits  CBG MONITORING, ED - Abnormal; Notable for the following components:   Glucose-Capillary 147 (*)    All other components within normal limits  I-STAT CG4 LACTIC ACID, ED - Abnormal; Notable for the following components:   Lactic Acid, Venous 2.19 (*)    All other components within normal limits  CULTURE, BLOOD (ROUTINE X 2)  CULTURE, BLOOD (ROUTINE X 2)  MRSA PCR SCREENING  HIV ANTIBODY (ROUTINE TESTING W REFLEX)  ROCKY MTN SPOTTED FVR ABS PNL(IGG+IGM)  B. BURGDORFI ANTIBODIES  EHRLICHIA ANTIBODY PANEL  I-STAT CG4 LACTIC ACID, ED    EKG EKG  Interpretation  Date/Time:  Thursday June 28 2018 21:19:13 EDT Ventricular Rate:  87 PR Interval:    QRS Duration: 138 QT Interval:  384 QTC Calculation: 462 R Axis:   82 Text Interpretation:  Sinus rhythm LAE, consider biatrial enlargement Right bundle branch block somewhat similar to previous Confirmed by Merrily Pew [phone]) on 06/28/2018 10:23:36 PM   Radiology Ct Angio Head W Or Wo Contrast  Result Date: 06/29/2018 CLINICAL DATA:  Headache, fever, altered mental status. EXAM: CT ANGIOGRAPHY HEAD AND NECK TECHNIQUE: Multidetector CT imaging of the head and neck was performed using the standard protocol during bolus administration of intravenous contrast. Multiplanar CT image reconstructions and MIPs were obtained to evaluate the vascular anatomy. Carotid stenosis measurements (when applicable) are obtained utilizing NASCET criteria, using the distal internal carotid diameter as the denominator. CONTRAST:  63mL ISOVUE-370 IOPAMIDOL (ISOVUE-370) INJECTION 76% COMPARISON:  CT head 06/28/2018. CT head 09/12/2017. FINDINGS: CTA NECK FINDINGS Aortic arch: Standard branching. Imaged portion shows no evidence of aneurysm or dissection. No significant stenosis of the major arch vessel origins. Right carotid system: No evidence of dissection, stenosis (50% or greater) or occlusion. Nonstenotic calcific plaque at the bifurcation. Left carotid system: No evidence of dissection, stenosis (50% or greater) or occlusion. Nonstenotic calcific plaque at the bifurcation. Vertebral arteries: LEFT vertebral dominant. Both are patent. No ostial narrowing or stenosis in the neck. Skeleton: No worrisome osseous lesion. C5-6 ACDF appears well-healed. Other neck: No neck masses. Upper chest: No pneumothorax or mass. Review of the MIP images confirms the above findings CTA HEAD FINDINGS Anterior circulation: No significant stenosis, proximal occlusion, aneurysm, or vascular malformation. Nonstenotic calcific  atheromatous change in the cavernous internal carotid arteries bilaterally. Posterior circulation: No significant stenosis, proximal occlusion, aneurysm, or vascular malformation. Minor atheromatous change distal LEFT vertebral. Venous sinuses: As permitted by contrast timing, patent. Anatomic variants: None of significance. Delayed phase: No abnormal intracranial enhancement. Review of the MIP images confirms the above findings IMPRESSION: No intracranial or extracranial flow-limiting stenosis, occlusion, or dissection. Calcific nonstenotic atrial and intracranial atherosclerotic disease as described. No abnormal intracranial enhancement. Asymmetric LEFT anterior internal capsule/basal ganglia hypodensity remains of  indeterminate age, but likely represents chronic ischemia which has developed since December of 2018. Electronically Signed   By: Staci Righter M.D.   On: 06/29/2018 12:17   Dg Chest 2 View  Result Date: 06/28/2018 CLINICAL DATA:  Headache and altered mental status. Fever of 103. History of hypertension, atrial fibrillation, pacemaker. EXAM: CHEST - 2 VIEW COMPARISON:  09/12/2017 FINDINGS: Cardiac pacemaker. Shallow inspiration. Mild cardiac enlargement. No vascular congestion, edema, or consolidation. No blunting of costophrenic angles. No pneumothorax. Mediastinal contours appear intact. Postoperative changes in the cervical spine. IMPRESSION: Mild cardiac enlargement. No evidence of active pulmonary disease. Electronically Signed   By: Lucienne Capers M.D.   On: 06/28/2018 22:33   Ct Head Wo Contrast  Result Date: 06/28/2018 CLINICAL DATA:  Headache and altered mental status. Fever of 103. normal neurological examination. EXAM: CT HEAD WITHOUT CONTRAST TECHNIQUE: Contiguous axial images were obtained from the base of the skull through the vertex without intravenous contrast. COMPARISON:  09/12/2017 FINDINGS: Brain: Diffuse cerebral atrophy. Mild ventricular dilatation consistent with central  atrophy. Low-attenuation changes in the deep white matter consistent small vessel ischemia. Since the previous study, there is interval development of asymmetric low-attenuation change in the left internal capsule region. This could indicate acute or subacute ischemia. Consider MRI for further evaluation if clinically suspicious. No mass-effect or midline shift. No abnormal extra-axial fluid collections. Gray-white matter junctions are distinct. Basal cisterns are not effaced. No acute intracranial hemorrhage. Vascular: Moderate intracranial arterial vascular calcifications are present. Skull: Focal thinning of the calvarium in the left temporal region, unchanged since previous study. Significance is uncertain. No expansile or destructive bone lesions are identified. No depressed skull fractures. Sinuses/Orbits: Paranasal sinuses and mastoid air cells are clear. Other: None. IMPRESSION: 1. Interval development of asymmetric low-attenuation change in the left internal capsule region. This could indicate acute or subacute ischemia. Consider MRI for further evaluation if clinically suspicious. 2. No acute intracranial hemorrhage or mass effect. 3. Chronic atrophy and small vessel ischemic changes. Electronically Signed   By: Lucienne Capers M.D.   On: 06/28/2018 22:39   Ct Angio Neck W Or Wo Contrast  Result Date: 06/29/2018 CLINICAL DATA:  Headache, fever, altered mental status. EXAM: CT ANGIOGRAPHY HEAD AND NECK TECHNIQUE: Multidetector CT imaging of the head and neck was performed using the standard protocol during bolus administration of intravenous contrast. Multiplanar CT image reconstructions and MIPs were obtained to evaluate the vascular anatomy. Carotid stenosis measurements (when applicable) are obtained utilizing NASCET criteria, using the distal internal carotid diameter as the denominator. CONTRAST:  50mL ISOVUE-370 IOPAMIDOL (ISOVUE-370) INJECTION 76% COMPARISON:  CT head 06/28/2018. CT head  09/12/2017. FINDINGS: CTA NECK FINDINGS Aortic arch: Standard branching. Imaged portion shows no evidence of aneurysm or dissection. No significant stenosis of the major arch vessel origins. Right carotid system: No evidence of dissection, stenosis (50% or greater) or occlusion. Nonstenotic calcific plaque at the bifurcation. Left carotid system: No evidence of dissection, stenosis (50% or greater) or occlusion. Nonstenotic calcific plaque at the bifurcation. Vertebral arteries: LEFT vertebral dominant. Both are patent. No ostial narrowing or stenosis in the neck. Skeleton: No worrisome osseous lesion. C5-6 ACDF appears well-healed. Other neck: No neck masses. Upper chest: No pneumothorax or mass. Review of the MIP images confirms the above findings CTA HEAD FINDINGS Anterior circulation: No significant stenosis, proximal occlusion, aneurysm, or vascular malformation. Nonstenotic calcific atheromatous change in the cavernous internal carotid arteries bilaterally. Posterior circulation: No significant stenosis, proximal occlusion, aneurysm, or vascular malformation. Minor  atheromatous change distal LEFT vertebral. Venous sinuses: As permitted by contrast timing, patent. Anatomic variants: None of significance. Delayed phase: No abnormal intracranial enhancement. Review of the MIP images confirms the above findings IMPRESSION: No intracranial or extracranial flow-limiting stenosis, occlusion, or dissection. Calcific nonstenotic atrial and intracranial atherosclerotic disease as described. No abnormal intracranial enhancement. Asymmetric LEFT anterior internal capsule/basal ganglia hypodensity remains of indeterminate age, but likely represents chronic ischemia which has developed since December of 2018. Electronically Signed   By: Staci Righter M.D.   On: 06/29/2018 12:17    Procedures Procedures (including critical care time)  CRITICAL CARE Performed by: Merrily Pew Total critical care time: 35  minutes Critical care time was exclusive of separately billable procedures and treating other patients. Critical care was necessary to treat or prevent imminent or life-threatening deterioration. Critical care was time spent personally by me on the following activities: development of treatment plan with patient and/or surrogate as well as nursing, discussions with consultants, evaluation of patient's response to treatment, examination of patient, obtaining history from patient or surrogate, ordering and performing treatments and interventions, ordering and review of laboratory studies, ordering and review of radiographic studies, pulse oximetry and re-evaluation of patient's condition.   Medications Ordered in ED Medications  carvedilol (COREG) tablet 12.5 mg (12.5 mg Oral Not Given 06/29/18 1618)  irbesartan (AVAPRO) tablet 300 mg (300 mg Oral Not Given 06/29/18 1000)  finasteride (PROSCAR) tablet 5 mg (5 mg Oral Not Given 06/29/18 1403)  0.9 %  sodium chloride infusion ( Intravenous New Bag/Given 06/29/18 1615)  ondansetron (ZOFRAN) tablet 4 mg (has no administration in time range)    Or  ondansetron (ZOFRAN) injection 4 mg (has no administration in time range)  acetaminophen (TYLENOL) tablet 650 mg ( Oral See Alternative 06/29/18 1434)    Or  acetaminophen (TYLENOL) suppository 650 mg (650 mg Rectal Given 06/29/18 1434)  vancomycin (VANCOCIN) IVPB 750 mg/150 ml premix (750 mg Intravenous New Bag/Given 06/29/18 1119)  cefTRIAXone (ROCEPHIN) 2 g in sodium chloride 0.9 % 100 mL IVPB (2 g Intravenous New Bag/Given 06/29/18 1616)  acyclovir (ZOVIRAX) 870 mg in dextrose 5 % 150 mL IVPB (870 mg Intravenous New Bag/Given 06/29/18 1737)  doxycycline (VIBRAMYCIN) 100 mg in sodium chloride 0.9 % 250 mL IVPB (100 mg Intravenous New Bag/Given 06/29/18 1739)  amLODipine (NORVASC) tablet 5 mg (5 mg Oral Not Given 06/29/18 1618)  lactated ringers bolus 1,000 mL ( Intravenous Stopped 06/28/18 2250)  acetaminophen  (TYLENOL) tablet 1,000 mg (1,000 mg Oral Given 06/28/18 2154)  vancomycin (VANCOCIN) 1,750 mg in sodium chloride 0.9 % 500 mL IVPB ( Intravenous Stopped 06/29/18 0157)  ceFEPIme (MAXIPIME) 2 g in sodium chloride 0.9 % 100 mL IVPB (0 g Intravenous Stopped 06/28/18 2319)  HYDROcodone-acetaminophen (NORCO/VICODIN) 5-325 MG per tablet 1 tablet (1 tablet Oral Given 06/29/18 0315)  iopamidol (ISOVUE-370) 76 % injection 75 mL (75 mLs Intravenous Contrast Given 06/29/18 1143)     Initial Impression / Assessment and Plan / ED Course  I have reviewed the triage vital signs and the nursing notes.  Pertinent labs & imaging results that were available during my care of the patient were reviewed by me and considered in my medical decision making (see chart for details).     Code sepsis initiated.  Making cefepime given.  Patient did have headache and confusion earlier in the day but does not like that now.  I suspect these are more from the fever and they are from meningitis.  He  has no nuchal rigidity or meningismus concerning for meningitis.  He could also have a fever secondary to possible stroke seen on CT scan however it does not have any neurologic symptoms at this time.  Possibly the confusion was related to the stroke?  Either way patient will need to be observed in the hospital until cultures come back as he does have a fever of unknown etiology at this point.  Final Clinical Impressions(s) / ED Diagnoses   Final diagnoses:  None    ED Discharge Orders    None       Koah Chisenhall, Corene Cornea, MD 06/29/18 1845

## 2018-06-29 NOTE — Progress Notes (Signed)
SLP Cancellation Note  Patient Details Name: Alex Maddox MRN: [id number] DOB: 06/26/1951   Cancelled treatment:         BSE not completed as ordered due to Pt being transferred to [facility] for further evaluation with neurology input, interventional radiology for LP and infectious disease consult.    Nurses preparing Pt for transfer upon SLP arrival.    Pt noted with possible meningitis and/or encephalitis. CTA head and CTA neck negative for acute strokes.  Joneen Boers  M.A., CCC-SLP Nayden [email or web address] [email or web address]@ .[email or web address] Ogren Aizlynn Digilio 06/29/2018, 2:36 PM

## 2018-06-29 NOTE — Progress Notes (Signed)
Paged on call provider [place], NP to notify of new onset sluggish pupillary response.

## 2018-06-30 LAB — HIV ANTIBODY (ROUTINE TESTING W REFLEX): HIV Screen 4th Generation wRfx: NONREACTIVE

## 2018-06-30 LAB — GLUCOSE, CAPILLARY: Glucose-Capillary: 111 mg/dL — ABNORMAL HIGH (ref 70–99)

## 2018-06-30 MED ORDER — METOPROLOL TARTRATE 5 MG/5ML IV SOLN
5.0000 mg | Freq: Once | INTRAVENOUS | Status: AC
  Administered 2018-06-30: 5 mg via INTRAVENOUS
  Filled 2018-06-30: qty 5

## 2018-06-30 MED ORDER — IBUPROFEN 200 MG PO TABS
600.0000 mg | ORAL_TABLET | Freq: Once | ORAL | Status: AC
  Administered 2018-06-30: 600 mg via ORAL
  Filled 2018-06-30: qty 3

## 2018-06-30 MED ORDER — ALPRAZOLAM 0.5 MG PO TABS
0.5000 mg | ORAL_TABLET | Freq: Three times a day (TID) | ORAL | Status: DC | PRN
  Administered 2018-06-30: 0.5 mg via ORAL
  Filled 2018-06-30: qty 1

## 2018-06-30 NOTE — Consult Note (Signed)
Neurology Consultation Reason for Consult: Confusion Referring Physician: Osei-Bonsu, G  CC: Altered mental status  History is obtained from: Patient, wife  HPI: Alex Maddox is a 67 y.o. male with a several day history of headache, confusion, fever.  Due to the confusion he sought care in the emergency department Friday evening.  He was admitted and started on broad-spectrum antibiotics including vancomycin and cefepime, this was then broadened to ceftriaxone and acyclovir.  Since that time, he has markedly improved overnight.  He states that his headache is significantly better and his wife states that the confusion markedly better.  He is also no longer febrile, when he had been febrile up to 103.  CT demonstrated interval development of low attenuation in the left internal capsule region, of unclear etiology.     ROS: A 14 point ROS was performed and is negative except as noted in the HPI.   Past Medical History:  Diagnosis Date  . Abnormal echocardiogram    stress myoview 10/29/2010  . Arthritis    " IN MY BACK "  . Dyslipidemia   . Dysrhythmia    Atrial fibrillation  . Hypertension   . Paroxysmal atrial fibrillation (HCC)   . Presence of permanent cardiac pacemaker   . Sinus node dysfunction (HCC)   . Sleep apnea, obstructive    sleep study 10/09/2007  AHI 5.73/hr  REM AHI 11.90/hr   . 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   LEFT THUMB   . Stroke ([place])   . Syncope    2D Echocardiogram 09/27/2010 EF between 40 to 45%  . Tachycardia-bradycardia ([place])   . Transient ischemic attack (TIA) 06/07     Family History  Problem Relation Age of Onset  . Hypertension Mother   . Heart attack Father 34       deceased from heart attack  . Hypertension Brother   . Colon cancer Neg Hx      Social History:  reports that he has never smoked. He has never used smokeless tobacco. He reports that he drinks alcohol. He reports that he does not use drugs.   Exam: Current  vital signs: BP 114/82 (BP Location: Right Arm)   Pulse 64   Temp 97.7 F (36.5 C) (Oral)   Resp 16   Ht 6' (1.829 m)   Wt 87.5 kg   SpO2 97%   BMI 26.16 kg/m  Vital signs in last 24 hours: Temp:  [97.5 F (36.4 C)-102.8 F (39.3 C)] 97.7 F (36.5 C) (09/21 1541) Pulse Rate:  [60-84] 64 (09/21 1541) Resp:  [16-19] 16 (09/21 1736) BP: (114-161)/(77-98) 114/82 (09/21 1736) SpO2:  [96 %-97 %] 97 % (09/21 1736)   Physical Exam  Constitutional: Appears well-developed and well-nourished.  Psych: Affect appropriate to situation Eyes: No scleral injection HENT: No OP obstrucion Head: Normocephalic.  Cardiovascular: Normal rate and regular rhythm.  Respiratory: Effort normal, non-labored breathing GI: Soft.  No distension. There is no tenderness.  Skin: WDI  Neuro: Mental Status: Patient is awake, alert, oriented to person, place, month, year, and situation. Patient is able to give a clear and coherent history. No signs of aphasia or neglect Cranial Nerves: II: Visual Fields are full. Pupils are equal, round, and reactive to light.   III,IV, VI: EOMI without ptosis or diploplia.  V: Facial sensation is symmetric to temperature VII: Facial movement is symmetric.  VIII: hearing is intact to voice X: Uvula elevates symmetrically XI: Shoulder shrug is symmetric. XII:  tongue is midline without atrophy or fasciculations.  Motor: Tone is normal. Bulk is normal. 5/5 strength was present in all four extremities.  Sensory: Sensation is symmetric to light touch and temperature in the arms and legs. Cerebellar: FNF intact bilaterally   I have reviewed labs in epic and the results pertinent to this consultation are: Most recent INR 8.14 [place], [place], [facility] spotted fever titers pending CMP-mild hyponatremia, otherwise unremarkable WBC 8.8  I have reviewed the images obtained: CTA is unremarkable, CT head - age indeterminant hypointensity in the left frontal  region EEG - right frontocentral sharp.   Impression: 67 year old male with a history of headache, fever, altered mental status which clinically improved with antibiotics.  I think clinically this has the appearance of meningitis, and his right frontocentral epileptiform discharge could be simply manifestation of irritability due to infection.  Hypodensity on CT could be an old stroke, but I think this is unclear and I do think an MRI with and without contrast is indicated.  He needs CSF sampling, but his INR has been too elevated, most recent over 2.  Recommendations: 1) MRI brain with and without contrast 2) Keppra 500 mg twice daily 3) lumbar puncture once INR is less than 1.5, would consider bridging with heparin once subtherapeutic 4) antibiotics empirically pending lumbar puncture.,  May need to consider ID involvement as the CSF is likely been sterilized at this point.   Roland Rack, MD [facility] [phone]  If 7pm- 7am, please page neurology on call as listed in [place].

## 2018-06-30 NOTE — Progress Notes (Signed)
Verbal Order received from [facility] provider to attempt to give PO Ibuprofen, despite NPO order. [name]

## 2018-06-30 NOTE — Progress Notes (Signed)
Pt rectal temp 102.8. [facility] provider notified. Tylenol given. Will continue to monitor. [name]

## 2018-06-30 NOTE — Progress Notes (Signed)
Pt's heart rate currently 142; orders received. Pt was given 5mg  of lopressor (IV) and 0.5 of Xanax because he states he is very anxious and nervous. Will continue to monitor.

## 2018-06-30 NOTE — Progress Notes (Signed)
Patient Demographics:    Alex Maddox, is a 67 y.o. male, DOB - Dec 22, 1950, VXY:[id number]  Admit date - 06/28/2018   Admitting Physician Oswald Hillock, MD  Outpatient Primary MD for the patient is Redmond School, MD  LOS - 1   Chief Complaint  Patient presents with  . Headache  . Fever        Subjective:    Alex Maddox was seen on rounds today with wife at bedside. Patient is now better, interactive and responsive. Denies any fever or chills.  Assessment  & Plan :    Principal Problem:   Acute Toxic metabolic encephalopathy due to infection Active Problems:   Atrial fibrillation ([place])   Long term current use of anticoagulant therapy   Pacemaker - Medtronic REVO dual-chamber implanted August 2012   HTN (hypertension)   Thrombocytopenia (HCC)   Sepsis ([place])   Fever in adult >> 103   Suspected CNS infection  Brief Summary 67 year old with past medical history relevant for atrial fibrillation on chronic anticoagulation, history of recurrent orthostatic syncope, history of prior TIA, admitted on 06/29/2018 with suspicion of possible stroke versus CNS infection .  Initial CT head suggested possible stroke, however CTA head and CTA neck without acute strokes, unable to do MRI due to pacemaker in situ. Patient is Alex Maddox [place] presented with fevers up to 103.5, headaches, malaise, fatigue and confusion/disorientation--suspicion for CNS infection (viral meningitis versus rickettsial infection)-transferred to [facility] please consult neurology interventional radiology and infectious disease when patient arrives at [facility].  Patient needs lumbar puncture however he is on Coumadin.  Repeat INR pending  Plan:- 1)Possible Meningitis/possible encephalitis- improving with acyclovir and antibiotics.LP held due to and coagulation. I will , UA does not support UTI, blood cultures are pending.  RMSF, Lyme and Ehrlichia antibody panel are pending. Awaiting neurology input, interventional radiology for LP, and infectious disease consult,  2) acute toxic metabolic encephalopathy--- secondary to presumed CNS infection, improving.  CTA head and CTA neck negative for acute strokes.  Overall improving 3)H/o chronic atrial fibrillation--- continue Coreg 12.5 g twice daily for rate control, hold anticoagulation to allow for possible lumbar puncture, patient with history of prior TIAs, wife verbalizes understanding of risk of stroke/VT with discontinuation of anticoagulation  4)HTN--- BP is not at goal, CTA head and CTA neck negative for acute stroke, increase amlodipine to 5 mg daily, continue Coreg 12.5 mg twice daily, give Avapro 300 mg daily   Code Status : FULL   Disposition Plan  : home  Consults  :  pending ID/Neurology/IR consults   DVT Prophylaxis  :  SCDs (was on Coumadin at home)  Lab Results  Component Value Date   PLT 122 (L) 06/29/2018    Inpatient Medications  Scheduled Meds: . amLODipine  5 mg Oral Daily  . carvedilol  12.5 mg Oral BID WC  . finasteride  5 mg Oral Daily  . irbesartan  300 mg Oral Daily   Continuous Infusions: . sodium chloride 75 mL/hr at 06/29/18 1615  . acyclovir 870 mg (06/30/18 0859)  . cefTRIAXone (ROCEPHIN)  IV 2 g (06/30/18 0345)  . doxycycline (VIBRAMYCIN) IV 100 mg (06/30/18 0624)  . levETIRAcetam 500 mg (06/30/18  0837)  . vancomycin 750 mg (06/30/18 0031)   PRN Meds:.acetaminophen **OR** acetaminophen, ondansetron **OR** ondansetron (ZOFRAN) IV    Anti-infectives (From admission, onward)   Start     Dose/Rate Route Frequency Ordered Stop   06/29/18 1600  doxycycline (VIBRAMYCIN) 100 mg in sodium chloride 0.9 % 250 mL IVPB     100 mg 125 mL/hr over 120 Minutes Intravenous Every 12 hours 06/29/18 1409     06/29/18 1500  acyclovir (ZOVIRAX) 870 mg in dextrose 5 % 150 mL IVPB     870 mg 167.4 mL/hr over 60 Minutes Intravenous  Every 8 hours 06/29/18 1401     06/29/18 1430  cefTRIAXone (ROCEPHIN) 2 g in sodium chloride 0.9 % 100 mL IVPB  Status:  Discontinued     2 g 200 mL/hr over 30 Minutes Intravenous Every 12 hours 06/29/18 1416 06/29/18 1416   06/29/18 1400  cefTRIAXone (ROCEPHIN) 2 g in sodium chloride 0.9 % 100 mL IVPB     2 g 200 mL/hr over 30 Minutes Intravenous Every 12 hours 06/29/18 1355     06/29/18 1100  ceFEPIme (MAXIPIME) 2 g in sodium chloride 0.9 % 100 mL IVPB  Status:  Discontinued     2 g 200 mL/hr over 30 Minutes Intravenous Every 12 hours 06/29/18 0908 06/29/18 1354   06/29/18 1000  vancomycin (VANCOCIN) IVPB 750 mg/150 ml premix     750 mg 150 mL/hr over 60 Minutes Intravenous Every 12 hours 06/29/18 0908     06/28/18 2230  vancomycin (VANCOCIN) 1,750 mg in sodium chloride 0.9 % 500 mL IVPB     1,750 mg 250 mL/hr over 120 Minutes Intravenous  Once 06/28/18 2217 06/29/18 0157   06/28/18 2230  ceFEPIme (MAXIPIME) 2 g in sodium chloride 0.9 % 100 mL IVPB     2 g 200 mL/hr over 30 Minutes Intravenous  Once 06/28/18 2217 06/28/18 2319        Objective:   Vitals:   06/30/18 0119 06/30/18 0436 06/30/18 0600 06/30/18 0757  BP:  131/77  132/85  Pulse:  61  60  Resp:  19  16  Temp: (!) 101.8 F (38.8 C)  97.7 F (36.5 C) (!) 97.5 F (36.4 C)  TempSrc: Rectal  Oral Oral  SpO2:  97%  97%  Weight:      Height:        Wt Readings from Last 3 Encounters:  06/29/18 87.5 kg  01/17/18 89.4 kg  11/13/17 90.7 kg     Intake/Output Summary (Last 24 hours) at 06/30/2018 1027 Last data filed at 06/29/2018 1731 Gross per 24 hour  Intake 944.23 ml  Output 1250 ml  Net -305.77 ml     Physical Exam    Gen:-no acute distress HEENT:- [place].AT, No sclera icterus Neck-Supple Neck, no meningismus Lungs-  CTAB , good air movement CV- S1, S2 normal, right subclavian pacemaker in situ Abd-  +ve B.Sounds, Abd Soft, No tenderness, no CVA area tenderness Extremity/Skin:- No  edema,   good pulses,  no significant rash, no ticks found on skin exam YKD:XIPJAS lethargic but very easily arousable, no acute focalities, oriented 2   Data Review:   Micro Results Recent Results (from the past 240 hour(s))  Blood Culture (routine x 2)     Status: None (Preliminary result)   Collection Time: 06/28/18  9:48 PM  Result Value Ref Range Status   Specimen Description BLOOD LEFT HAND  Final   Special Requests   Final  BOTTLES DRAWN AEROBIC AND ANAEROBIC Blood Culture adequate volume   Culture   Final    NO GROWTH 2 DAYS Performed at [facility], [address] [place], [place] [address]    Report Status PENDING  Incomplete  Blood Culture (routine x 2)     Status: None (Preliminary result)   Collection Time: 06/28/18  9:53 PM  Result Value Ref Range Status   Specimen Description BLOOD RIGHT FOREARM  Final   Special Requests   Final    Blood Culture adequate volume BOTTLES DRAWN AEROBIC AND ANAEROBIC   Culture   Final    NO GROWTH 2 DAYS Performed at [facility], [address] [place], [place] [address]    Report Status PENDING  Incomplete  MRSA PCR Screening     Status: None   Collection Time: 06/29/18  3:58 AM  Result Value Ref Range Status   MRSA by PCR NEGATIVE NEGATIVE Final    Comment:        The GeneXpert MRSA Assay (FDA approved for NASAL specimens only), is one component of a comprehensive MRSA colonization surveillance program. It is not intended to diagnose MRSA infection nor to guide or monitor treatment for MRSA infections. Performed at [facility], [address] [place], [place] [address]     Radiology Reports Ct Angio Head W Or Wo Contrast  Result Date: 06/29/2018 CLINICAL DATA:  Headache, fever, altered mental status. EXAM: CT ANGIOGRAPHY HEAD AND NECK TECHNIQUE: Multidetector CT imaging of the head and neck was performed using the standard protocol during bolus administration of intravenous contrast. Multiplanar CT image reconstructions and MIPs were  obtained to evaluate the vascular anatomy. Carotid stenosis measurements (when applicable) are obtained utilizing NASCET criteria, using the distal internal carotid diameter as the denominator. CONTRAST:  4mL ISOVUE-370 IOPAMIDOL (ISOVUE-370) INJECTION 76% COMPARISON:  CT head 06/28/2018. CT head 09/12/2017. FINDINGS: CTA NECK FINDINGS Aortic arch: Standard branching. Imaged portion shows no evidence of aneurysm or dissection. No significant stenosis of the major arch vessel origins. Right carotid system: No evidence of dissection, stenosis (50% or greater) or occlusion. Nonstenotic calcific plaque at the bifurcation. Left carotid system: No evidence of dissection, stenosis (50% or greater) or occlusion. Nonstenotic calcific plaque at the bifurcation. Vertebral arteries: LEFT vertebral dominant. Both are patent. No ostial narrowing or stenosis in the neck. Skeleton: No worrisome osseous lesion. C5-6 ACDF appears well-healed. Other neck: No neck masses. Upper chest: No pneumothorax or mass. Review of the MIP images confirms the above findings CTA HEAD FINDINGS Anterior circulation: No significant stenosis, proximal occlusion, aneurysm, or vascular malformation. Nonstenotic calcific atheromatous change in the cavernous internal carotid arteries bilaterally. Posterior circulation: No significant stenosis, proximal occlusion, aneurysm, or vascular malformation. Minor atheromatous change distal LEFT vertebral. Venous sinuses: As permitted by contrast timing, patent. Anatomic variants: None of significance. Delayed phase: No abnormal intracranial enhancement. Review of the MIP images confirms the above findings IMPRESSION: No intracranial or extracranial flow-limiting stenosis, occlusion, or dissection. Calcific nonstenotic atrial and intracranial atherosclerotic disease as described. No abnormal intracranial enhancement. Asymmetric LEFT anterior internal capsule/basal ganglia hypodensity remains of indeterminate age,  but likely represents chronic ischemia which has developed since December of 2018. Electronically Signed   By: Staci Righter M.D.   On: 06/29/2018 12:17   Dg Chest 2 View  Result Date: 06/28/2018 CLINICAL DATA:  Headache and altered mental status. Fever of 103. History of hypertension, atrial fibrillation, pacemaker. EXAM: CHEST - 2 VIEW COMPARISON:  09/12/2017 FINDINGS: Cardiac pacemaker. Shallow  inspiration. Mild cardiac enlargement. No vascular congestion, edema, or consolidation. No blunting of costophrenic angles. No pneumothorax. Mediastinal contours appear intact. Postoperative changes in the cervical spine. IMPRESSION: Mild cardiac enlargement. No evidence of active pulmonary disease. Electronically Signed   By: Lucienne Capers M.D.   On: 06/28/2018 22:33   Ct Head Wo Contrast  Result Date: 06/28/2018 CLINICAL DATA:  Headache and altered mental status. Fever of 103. normal neurological examination. EXAM: CT HEAD WITHOUT CONTRAST TECHNIQUE: Contiguous axial images were obtained from the base of the skull through the vertex without intravenous contrast. COMPARISON:  09/12/2017 FINDINGS: Brain: Diffuse cerebral atrophy. Mild ventricular dilatation consistent with central atrophy. Low-attenuation changes in the deep white matter consistent small vessel ischemia. Since the previous study, there is interval development of asymmetric low-attenuation change in the left internal capsule region. This could indicate acute or subacute ischemia. Consider MRI for further evaluation if clinically suspicious. No mass-effect or midline shift. No abnormal extra-axial fluid collections. Gray-white matter junctions are distinct. Basal cisterns are not effaced. No acute intracranial hemorrhage. Vascular: Moderate intracranial arterial vascular calcifications are present. Skull: Focal thinning of the calvarium in the left temporal region, unchanged since previous study. Significance is uncertain. No expansile or  destructive bone lesions are identified. No depressed skull fractures. Sinuses/Orbits: Paranasal sinuses and mastoid air cells are clear. Other: None. IMPRESSION: 1. Interval development of asymmetric low-attenuation change in the left internal capsule region. This could indicate acute or subacute ischemia. Consider MRI for further evaluation if clinically suspicious. 2. No acute intracranial hemorrhage or mass effect. 3. Chronic atrophy and small vessel ischemic changes. Electronically Signed   By: Lucienne Capers M.D.   On: 06/28/2018 22:39   Ct Angio Neck W Or Wo Contrast  Result Date: 06/29/2018 CLINICAL DATA:  Headache, fever, altered mental status. EXAM: CT ANGIOGRAPHY HEAD AND NECK TECHNIQUE: Multidetector CT imaging of the head and neck was performed using the standard protocol during bolus administration of intravenous contrast. Multiplanar CT image reconstructions and MIPs were obtained to evaluate the vascular anatomy. Carotid stenosis measurements (when applicable) are obtained utilizing NASCET criteria, using the distal internal carotid diameter as the denominator. CONTRAST:  56mL ISOVUE-370 IOPAMIDOL (ISOVUE-370) INJECTION 76% COMPARISON:  CT head 06/28/2018. CT head 09/12/2017. FINDINGS: CTA NECK FINDINGS Aortic arch: Standard branching. Imaged portion shows no evidence of aneurysm or dissection. No significant stenosis of the major arch vessel origins. Right carotid system: No evidence of dissection, stenosis (50% or greater) or occlusion. Nonstenotic calcific plaque at the bifurcation. Left carotid system: No evidence of dissection, stenosis (50% or greater) or occlusion. Nonstenotic calcific plaque at the bifurcation. Vertebral arteries: LEFT vertebral dominant. Both are patent. No ostial narrowing or stenosis in the neck. Skeleton: No worrisome osseous lesion. C5-6 ACDF appears well-healed. Other neck: No neck masses. Upper chest: No pneumothorax or mass. Review of the MIP images confirms the  above findings CTA HEAD FINDINGS Anterior circulation: No significant stenosis, proximal occlusion, aneurysm, or vascular malformation. Nonstenotic calcific atheromatous change in the cavernous internal carotid arteries bilaterally. Posterior circulation: No significant stenosis, proximal occlusion, aneurysm, or vascular malformation. Minor atheromatous change distal LEFT vertebral. Venous sinuses: As permitted by contrast timing, patent. Anatomic variants: None of significance. Delayed phase: No abnormal intracranial enhancement. Review of the MIP images confirms the above findings IMPRESSION: No intracranial or extracranial flow-limiting stenosis, occlusion, or dissection. Calcific nonstenotic atrial and intracranial atherosclerotic disease as described. No abnormal intracranial enhancement. Asymmetric LEFT anterior internal capsule/basal ganglia hypodensity remains of indeterminate age,  but likely represents chronic ischemia which has developed since December of 2018. Electronically Signed   By: Staci Righter M.D.   On: 06/29/2018 12:17     CBC Recent Labs  Lab 06/28/18 2148 06/29/18 0556  WBC 8.8 9.8  HGB 15.9 15.2  HCT 45.5 44.2  PLT 123* 122*  MCV 93.8 94.0  MCH 32.8 32.3  MCHC 34.9 34.4  RDW 12.3 12.3  LYMPHSABS 1.2  --   MONOABS 0.4  --   EOSABS 0.0  --   BASOSABS 0.0  --     Chemistries  Recent Labs  Lab 06/28/18 2148 06/29/18 0556  NA 132* 135  K 3.6 3.5  CL 98 103  CO2 26 25  GLUCOSE 167* 117*  BUN 18 19  CREATININE 1.17 1.05  CALCIUM 9.6 9.3  AST 29 25  ALT 39 36  ALKPHOS 53 48  BILITOT 1.4* 1.6*   ------------------------------------------------------------------------------------------------------------------ No results for input(s): CHOL, HDL, LDLCALC, TRIG, CHOLHDL, LDLDIRECT in the last 72 hours.  No results found for: HGBA1C ------------------------------------------------------------------------------------------------------------------ No results for  input(s): TSH, T4TOTAL, T3FREE, THYROIDAB in the last 72 hours.  Invalid input(s): FREET3 ------------------------------------------------------------------------------------------------------------------ No results for input(s): VITAMINB12, FOLATE, FERRITIN, TIBC, IRON, RETICCTPCT in the last 72 hours.  Coagulation profile Recent Labs  Lab 06/28/18 2148  INR 2.17    No results for input(s): DDIMER in the last 72 hours.  Cardiac Enzymes No results for input(s): CKMB, TROPONINI, MYOGLOBIN in the last 168 hours.  Invalid input(s): CK ------------------------------------------------------------------------------------------------------------------   Benito Mccreedy M.D on 06/30/2018 at 10:27 AM  Pager---[phone] Go to [email or web address] - password [email or web address] for contact info  [facility] - Office  [phone]

## 2018-06-30 NOTE — Progress Notes (Signed)
Pt rectal temp 101.8 1HR after RE tylenol administered, [facility] provider paged. Will continue to monitor. [name]

## 2018-06-30 NOTE — Evaluation (Signed)
Physical Therapy Evaluation Patient Details Name: Alex Maddox MRN: [id number] DOB: 1951/09/20 Today's Date: 06/30/2018   History of Present Illness   Alex Maddox  is a 67 y.o. male, with history of atrial fibrillation on chronic anticoagulation with Coumadin, syncope, TIA, hypertension came to ED with chief complaint of headache which started this morning.  Patient does have a history of recurrent syncope, has a pacemaker in place. Being tested for infectious process, awaiting lumbar puncture.  Clinical Impression  Pt admitted with above diagnosis. Pt currently with functional limitations due to the deficits listed below (see PT Problem List). Pt ambulated 100' with min A pushing IV pole, c/o dizziness but was first time up. Very lethargic even while ambulating. No significant strength deficits and anticipate that once acute event resolves pt will not have further PT needs but will follow during acute course.  Pt will benefit from skilled PT to increase their independence and safety with mobility to allow discharge to the venue listed below.       Follow Up Recommendations No PT follow up    Equipment Recommendations  None recommended by PT    Recommendations for Other Services OT consult     Precautions / Restrictions Precautions Precautions: Fall Restrictions Weight Bearing Restrictions: No      Mobility  Bed Mobility Overal bed mobility: Needs Assistance Bed Mobility: Supine to Sit     Supine to sit: Supervision     General bed mobility comments: pt able to get to EOB without assist once aroused  Transfers Overall transfer level: Needs assistance Equipment used: 1 person hand held assist Transfers: Sit to/from Stand Sit to Stand: Min assist         General transfer comment: min A due to dizziness, first time up in 3 days  Ambulation/Gait Ambulation/Gait assistance: Min assist Gait Distance (Feet): 100 Feet Assistive device: 1 person hand held assist;IV  Pole Gait Pattern/deviations: Step-through pattern;Decreased stride length Gait velocity: decreased Gait velocity interpretation: 1.31 - 2.62 ft/sec, indicative of limited community ambulator General Gait Details: pt pushed pole and held therapist's hand, mildly dizzy throughout ambulation, vc's to keep eyes open  Stairs            Wheelchair Mobility    Modified Rankin (Stroke Patients Only)       Balance Overall balance assessment: Needs assistance Sitting-balance support: No upper extremity supported Sitting balance-Leahy Scale: Good     Standing balance support: Single extremity supported Standing balance-Leahy Scale: Fair                               Pertinent Vitals/Pain Pain Assessment: No/denies pain    Home Living Family/patient expects to be discharged to:: Private residence Living Arrangements: Spouse/significant other Available Help at Discharge: Family;Available 24 hours/day Type of Home: House Home Access: Stairs to enter Entrance Stairs-Rails: None Entrance Stairs-Number of Steps: 2 Home Layout: Two level;Able to live on main level with bedroom/bathroom Home Equipment: None      Prior Function Level of Independence: Independent         Comments: pt is retired as Glass blower/designer and now [employer] all day and works outside     [employer]        Extremity/Trunk Assessment   Upper Extremity Assessment Upper Extremity Assessment: Overall WFL for tasks assessed    Lower Extremity Assessment Lower Extremity Assessment: Overall WFL for tasks assessed    Cervical /  Trunk Assessment Cervical / Trunk Assessment: Normal  Communication   Communication: HOH  Cognition Arousal/Alertness: Lethargic Behavior During Therapy: Flat affect Overall Cognitive Status: Impaired/Different from baseline Area of Impairment: Memory;Following commands;Awareness                     Memory: Decreased short-term memory Following  Commands: Follows multi-step commands with increased time;Follows one step commands with increased time   Awareness: Emergent   General Comments: pt slow to process and very lethargic. Wife reports that he is much improved from admission but has been very sleepy      General Comments      Exercises     Assessment/Plan    PT Assessment Patient needs continued PT services  PT Problem List Decreased activity tolerance;Decreased balance;Decreased mobility;Decreased cognition       PT Treatment Interventions DME instruction;Gait training;Stair training;Functional mobility training;Therapeutic activities;Therapeutic exercise;Balance training;Cognitive remediation;Patient/family education    PT Goals (Current goals can be found in the Care Plan section)  Acute Rehab PT Goals Patient Stated Goal: return home PT Goal Formulation: With patient/family Time For Goal Achievement: 07/14/18 Potential to Achieve Goals: Good    Frequency Min 3X/week   Barriers to discharge        Co-evaluation               AM-PAC PT "6 Clicks" Daily Activity  Outcome Measure Difficulty turning over in bed (including adjusting bedclothes, sheets and blankets)?: A Little Difficulty moving from lying on back to sitting on the side of the bed? : A Little Difficulty sitting down on and standing up from a chair with arms (e.g., wheelchair, bedside commode, etc,.)?: A Little Help needed moving to and from a bed to chair (including a wheelchair)?: A Little Help needed walking in hospital room?: A Little Help needed climbing 3-5 steps with a railing? : A Little 6 Click Score: 18    End of Session Equipment Utilized During Treatment: Gait belt Activity Tolerance: Patient tolerated treatment well Patient left: in chair;with call bell/phone within reach;with family/visitor present Nurse Communication: Mobility status PT Visit Diagnosis: Unsteadiness on feet (R26.81)    Time: 6979-4801 PT Time  Calculation (min) (ACUTE ONLY): 28 min   Charges:   PT Evaluation $PT Eval Moderate Complexity: 1 Mod PT Treatments $Gait Training: 8-22 mins        [place]  Pager [phone] [facility] 06/30/2018, 4:17 PM

## 2018-06-30 NOTE — Progress Notes (Signed)
Pt's heart rate is going into the 120's. RN went to check on the pt and pt is comfortable sitting in the recliner taking a nap, RN notified MD Osei-Bonsu in person. Will await orders.

## 2018-07-01 LAB — BASIC METABOLIC PANEL
ANION GAP: 12 (ref 5–15)
BUN: 17 mg/dL (ref 8–23)
CO2: 22 mmol/L (ref 22–32)
Calcium: 8.6 mg/dL — ABNORMAL LOW (ref 8.9–10.3)
Chloride: 103 mmol/L (ref 98–111)
Creatinine, Ser: 1.01 mg/dL (ref 0.61–1.24)
GFR calc Af Amer: >60 mL/min (ref >60)
GFR calc non Af Amer: >60 mL/min (ref >60)
GLUCOSE: 81 mg/dL (ref 70–99)
POTASSIUM: 3.2 mmol/L — AB (ref 3.5–5.1)
Sodium: 137 mmol/L (ref 135–145)

## 2018-07-01 LAB — CBC
HEMATOCRIT: 42.5 % (ref 39.0–52.0)
Hemoglobin: 14.5 g/dL (ref 13.0–17.0)
MCH: 31.7 pg (ref 26.0–34.0)
MCHC: 34.1 g/dL (ref 30.0–36.0)
MCV: 93 fL (ref 78.0–100.0)
PLATELETS: 127 10*3/uL — AB (ref 150–400)
RBC: 4.57 MIL/uL (ref 4.22–5.81)
RDW: 11.8 % (ref 11.5–15.5)
WBC: 7.8 10*3/uL (ref 4.0–10.5)

## 2018-07-01 LAB — PROTIME-INR
INR: 1.74
INR: 1.58
Prothrombin Time: 20.2 seconds — ABNORMAL HIGH (ref 11.4–15.2)
Prothrombin Time: 18.7 seconds — ABNORMAL HIGH (ref 11.4–15.2)

## 2018-07-01 LAB — VANCOMYCIN, TROUGH: Vancomycin Tr: 10 ug/mL — ABNORMAL LOW (ref 15–20)

## 2018-07-01 MED ORDER — POTASSIUM CHLORIDE CRYS ER 20 MEQ PO TBCR
20.0000 meq | EXTENDED_RELEASE_TABLET | Freq: Three times a day (TID) | ORAL | Status: AC
  Administered 2018-07-01 – 2018-07-02 (×4): 20 meq via ORAL
  Filled 2018-07-01 (×4): qty 1

## 2018-07-01 MED ORDER — VANCOMYCIN HCL IN DEXTROSE 1-5 GM/200ML-% IV SOLN
1000.0000 mg | Freq: Two times a day (BID) | INTRAVENOUS | Status: DC
  Administered 2018-07-01 – 2018-07-03 (×4): 1000 mg via INTRAVENOUS
  Filled 2018-07-01 (×5): qty 200

## 2018-07-01 MED ORDER — VANCOMYCIN HCL IN DEXTROSE 1-5 GM/200ML-% IV SOLN: 1000.0000 mg | Freq: Two times a day (BID) | INTRAVENOUS | Status: DC

## 2018-07-01 NOTE — Progress Notes (Signed)
Patient Demographics:    Alex Maddox, is a 67 y.o. male, DOB - 11/10/1950, SWF:[id number]  Admit date - 06/28/2018   Admitting Physician Oswald Hillock, MD  Outpatient Primary MD for the patient is Redmond School, MD  LOS - 2   Chief Complaint  Patient presents with  . Headache  . Fever        Subjective:    Alex Maddox was seen on rounds today - episodes of tachycardia, anxiety reported by nursing last evening- responded to xanax and lopressor order. No acute events overnight  Assessment  & Plan :    Principal Problem:   Acute Toxic metabolic encephalopathy due to infection Active Problems:   Atrial fibrillation ([place])   Long term current use of anticoagulant therapy   Pacemaker - Medtronic REVO dual-chamber implanted August 2012   HTN (hypertension)   Thrombocytopenia ([facility])   Sepsis ([place])   Fever in adult >> 103   Suspected CNS infection  Brief Summary 67 year old with past medical history relevant for atrial fibrillation on chronic anticoagulation, history of recurrent orthostatic syncope, history of prior TIA, admitted on 06/29/2018 with suspicion of possible stroke versus CNS infection .  Initial CT head suggested possible stroke, however CTA head and CTA neck without acute strokes, unable to do MRI due to pacemaker in situ. Patient is Alex Maddox [place] presented with fevers up to 103.5, headaches, malaise, fatigue and confusion/disorientation--suspicion for CNS infection (viral meningitis versus rickettsial infection)-transferred to [facility] please consult neurology interventional radiology and infectious disease when patient [facility] at [facility].  Patient needs lumbar puncture however he is on Coumadin.  Repeat INR pending  Plan:- 1)Possible Meningitis/possible encephalitis:  Improving with acyclovir and antibiotics. LP once INR is down with poss heparin bridge  blood  cultures, RMSF, Lyme and Ehrlichia antibody panel are pending. Neurology consult appreciated - f/u MRI  2) acute toxic metabolic encephalopathy--- secondary to presumed CNS infection, improving.  CTA head and CTA neck negative for acute strokes.  Overall improving 3)H/o chronic atrial fibrillation--- continue Coreg 12.5 g twice daily for rate control, hold anticoagulation to allow for possible lumbar puncture, patient with history of prior TIAs, wife verbalizes understanding of risk of stroke/VT with discontinuation of anticoagulation  4)HTN--- BP is not at goal, CTA head and CTA neck negative for acute stroke, increase amlodipine to 5 mg daily, continue Coreg 12.5 mg twice daily, give Avapro 300 mg daily  5) Anxiety: Cont home dose of benzo   Code Status : FULL   Disposition Plan  : home  Consults  :  pending ID/Neurology/IR consults   DVT Prophylaxis  :  SCDs (was on Coumadin at home)  Lab Results  Component Value Date   PLT 127 (L) 07/01/2018    Inpatient Medications  Scheduled Meds: . amLODipine  5 mg Oral Daily  . carvedilol  12.5 mg Oral BID WC  . finasteride  5 mg Oral Daily  . irbesartan  300 mg Oral Daily   Continuous Infusions: . sodium chloride 75 mL/hr at 06/29/18 1615  . acyclovir 870 mg (07/01/18 0747)  . cefTRIAXone (ROCEPHIN)  IV 2 g (07/01/18 0157)  . doxycycline (VIBRAMYCIN) IV 100 mg (07/01/18 0503)  . levETIRAcetam  500 mg (07/01/18 6440)  . vancomycin 750 mg (06/30/18 2246)   PRN Meds:.acetaminophen **OR** acetaminophen, ALPRAZolam, ondansetron **OR** ondansetron (ZOFRAN) IV    Anti-infectives (From admission, onward)   Start     Dose/Rate Route Frequency Ordered Stop   06/29/18 1600  doxycycline (VIBRAMYCIN) 100 mg in sodium chloride 0.9 % 250 mL IVPB     100 mg 125 mL/hr over 120 Minutes Intravenous Every 12 hours 06/29/18 1409     06/29/18 1500  acyclovir (ZOVIRAX) 870 mg in dextrose 5 % 150 mL IVPB     870 mg 167.4 mL/hr over 60 Minutes  Intravenous Every 8 hours 06/29/18 1401     06/29/18 1430  cefTRIAXone (ROCEPHIN) 2 g in sodium chloride 0.9 % 100 mL IVPB  Status:  Discontinued     2 g 200 mL/hr over 30 Minutes Intravenous Every 12 hours 06/29/18 1416 06/29/18 1416   06/29/18 1400  cefTRIAXone (ROCEPHIN) 2 g in sodium chloride 0.9 % 100 mL IVPB     2 g 200 mL/hr over 30 Minutes Intravenous Every 12 hours 06/29/18 1355     06/29/18 1100  ceFEPIme (MAXIPIME) 2 g in sodium chloride 0.9 % 100 mL IVPB  Status:  Discontinued     2 g 200 mL/hr over 30 Minutes Intravenous Every 12 hours 06/29/18 0908 06/29/18 1354   06/29/18 1000  vancomycin (VANCOCIN) IVPB 750 mg/150 ml premix     750 mg 150 mL/hr over 60 Minutes Intravenous Every 12 hours 06/29/18 0908     06/28/18 2230  vancomycin (VANCOCIN) 1,750 mg in sodium chloride 0.9 % 500 mL IVPB     1,750 mg 250 mL/hr over 120 Minutes Intravenous  Once 06/28/18 2217 06/29/18 0157   06/28/18 2230  ceFEPIme (MAXIPIME) 2 g in sodium chloride 0.9 % 100 mL IVPB     2 g 200 mL/hr over 30 Minutes Intravenous  Once 06/28/18 2217 06/28/18 2319        Objective:   Vitals:   06/30/18 1736 06/30/18 2000 06/30/18 2357 07/01/18 0404  BP: 114/82 124/76 120/73 115/73  Pulse:  68 64 60  Resp: 16 16 17 19   Temp:  98.2 F (36.8 C) 98.3 F (36.8 C) 98.3 F (36.8 C)  TempSrc: Oral Oral Oral Oral  SpO2: 97% 98% 97% 96%  Weight:      Height:        Wt Readings from Last 3 Encounters:  06/29/18 87.5 kg  01/17/18 89.4 kg  11/13/17 90.7 kg     Intake/Output Summary (Last 24 hours) at 07/01/2018 1014 Last data filed at 07/01/2018 0405 Gross per 24 hour  Intake 120 ml  Output 5550 ml  Net -5430 ml     Physical Exam    Gen:-no acute distress HEENT:- [place].AT, No sclera icterus Neck-Supple Neck, no meningismus Lungs-  CTAB , good air movement CV- S1, S2 normal, right subclavian pacemaker in situ Abd-  +ve B.Sounds, Abd Soft, No tenderness, no CVA area tenderness Extremity/Skin:- No   edema,   good pulses, no significant rash, no ticks found on skin exam CNS: Alert, oriented x 3 this morning   Data Review:   Micro Results Recent Results (from the past 240 hour(s))  Blood Culture (routine x 2)     Status: None (Preliminary result)   Collection Time: 06/28/18  9:48 PM  Result Value Ref Range Status   Specimen Description BLOOD LEFT HAND  Final   Special Requests   Final  BOTTLES DRAWN AEROBIC AND ANAEROBIC Blood Culture adequate volume   Culture   Final    NO GROWTH 3 DAYS Performed at [facility], [address], [place], [place] [address]    Report Status PENDING  Incomplete  Blood Culture (routine x 2)     Status: None (Preliminary result)   Collection Time: 06/28/18  9:53 PM  Result Value Ref Range Status   Specimen Description BLOOD RIGHT FOREARM  Final   Special Requests   Final    Blood Culture adequate volume BOTTLES DRAWN AEROBIC AND ANAEROBIC   Culture   Final    NO GROWTH 3 DAYS Performed at [facility], [address], [place], [place] [address]    Report Status PENDING  Incomplete  MRSA PCR Screening     Status: None   Collection Time: 06/29/18  3:58 AM  Result Value Ref Range Status   MRSA by PCR NEGATIVE NEGATIVE Final    Comment:        The GeneXpert MRSA Assay (FDA approved for NASAL specimens only), is one component of a comprehensive MRSA colonization surveillance program. It is not intended to diagnose MRSA infection nor to guide or monitor treatment for MRSA infections. Performed at [facility], [address] [place], [place] [address]     Radiology Reports Ct Angio Head W Or Wo Contrast  Result Date: 06/29/2018 CLINICAL DATA:  Headache, fever, altered mental status. EXAM: CT ANGIOGRAPHY HEAD AND NECK TECHNIQUE: Multidetector CT imaging of the head and neck was performed using the standard protocol during bolus administration of intravenous contrast. Multiplanar CT image reconstructions and MIPs were obtained to evaluate  the vascular anatomy. Carotid stenosis measurements (when applicable) are obtained utilizing NASCET criteria, using the distal internal carotid diameter as the denominator. CONTRAST:  51mL ISOVUE-370 IOPAMIDOL (ISOVUE-370) INJECTION 76% COMPARISON:  CT head 06/28/2018. CT head 09/12/2017. FINDINGS: CTA NECK FINDINGS Aortic arch: Standard branching. Imaged portion shows no evidence of aneurysm or dissection. No significant stenosis of the major arch vessel origins. Right carotid system: No evidence of dissection, stenosis (50% or greater) or occlusion. Nonstenotic calcific plaque at the bifurcation. Left carotid system: No evidence of dissection, stenosis (50% or greater) or occlusion. Nonstenotic calcific plaque at the bifurcation. Vertebral arteries: LEFT vertebral dominant. Both are patent. No ostial narrowing or stenosis in the neck. Skeleton: No worrisome osseous lesion. C5-6 ACDF appears well-healed. Other neck: No neck masses. Upper chest: No pneumothorax or mass. Review of the MIP images confirms the above findings CTA HEAD FINDINGS Anterior circulation: No significant stenosis, proximal occlusion, aneurysm, or vascular malformation. Nonstenotic calcific atheromatous change in the cavernous internal carotid arteries bilaterally. Posterior circulation: No significant stenosis, proximal occlusion, aneurysm, or vascular malformation. Minor atheromatous change distal LEFT vertebral. Venous sinuses: As permitted by contrast timing, patent. Anatomic variants: None of significance. Delayed phase: No abnormal intracranial enhancement. Review of the MIP images confirms the above findings IMPRESSION: No intracranial or extracranial flow-limiting stenosis, occlusion, or dissection. Calcific nonstenotic atrial and intracranial atherosclerotic disease as described. No abnormal intracranial enhancement. Asymmetric LEFT anterior internal capsule/basal ganglia hypodensity remains of indeterminate age, but likely represents  chronic ischemia which has developed since December of 2018. Electronically Signed   By: Staci Righter M.D.   On: 06/29/2018 12:17   Dg Chest 2 View  Result Date: 06/28/2018 CLINICAL DATA:  Headache and altered mental status. Fever of 103. History of hypertension, atrial fibrillation, pacemaker. EXAM: CHEST - 2 VIEW COMPARISON:  09/12/2017 FINDINGS: Cardiac pacemaker. Shallow  inspiration. Mild cardiac enlargement. No vascular congestion, edema, or consolidation. No blunting of costophrenic angles. No pneumothorax. Mediastinal contours appear intact. Postoperative changes in the cervical spine. IMPRESSION: Mild cardiac enlargement. No evidence of active pulmonary disease. Electronically Signed   By: Lucienne Capers M.D.   On: 06/28/2018 22:33   Ct Head Wo Contrast  Result Date: 06/28/2018 CLINICAL DATA:  Headache and altered mental status. Fever of 103. normal neurological examination. EXAM: CT HEAD WITHOUT CONTRAST TECHNIQUE: Contiguous axial images were obtained from the base of the skull through the vertex without intravenous contrast. COMPARISON:  09/12/2017 FINDINGS: Brain: Diffuse cerebral atrophy. Mild ventricular dilatation consistent with central atrophy. Low-attenuation changes in the deep white matter consistent small vessel ischemia. Since the previous study, there is interval development of asymmetric low-attenuation change in the left internal capsule region. This could indicate acute or subacute ischemia. Consider MRI for further evaluation if clinically suspicious. No mass-effect or midline shift. No abnormal extra-axial fluid collections. Gray-white matter junctions are distinct. Basal cisterns are not effaced. No acute intracranial hemorrhage. Vascular: Moderate intracranial arterial vascular calcifications are present. Skull: Focal thinning of the calvarium in the left temporal region, unchanged since previous study. Significance is uncertain. No expansile or destructive bone lesions are  identified. No depressed skull fractures. Sinuses/Orbits: Paranasal sinuses and mastoid air cells are clear. Other: None. IMPRESSION: 1. Interval development of asymmetric low-attenuation change in the left internal capsule region. This could indicate acute or subacute ischemia. Consider MRI for further evaluation if clinically suspicious. 2. No acute intracranial hemorrhage or mass effect. 3. Chronic atrophy and small vessel ischemic changes. Electronically Signed   By: Lucienne Capers M.D.   On: 06/28/2018 22:39   Ct Angio Neck W Or Wo Contrast  Result Date: 06/29/2018 CLINICAL DATA:  Headache, fever, altered mental status. EXAM: CT ANGIOGRAPHY HEAD AND NECK TECHNIQUE: Multidetector CT imaging of the head and neck was performed using the standard protocol during bolus administration of intravenous contrast. Multiplanar CT image reconstructions and MIPs were obtained to evaluate the vascular anatomy. Carotid stenosis measurements (when applicable) are obtained utilizing NASCET criteria, using the distal internal carotid diameter as the denominator. CONTRAST:  37mL ISOVUE-370 IOPAMIDOL (ISOVUE-370) INJECTION 76% COMPARISON:  CT head 06/28/2018. CT head 09/12/2017. FINDINGS: CTA NECK FINDINGS Aortic arch: Standard branching. Imaged portion shows no evidence of aneurysm or dissection. No significant stenosis of the major arch vessel origins. Right carotid system: No evidence of dissection, stenosis (50% or greater) or occlusion. Nonstenotic calcific plaque at the bifurcation. Left carotid system: No evidence of dissection, stenosis (50% or greater) or occlusion. Nonstenotic calcific plaque at the bifurcation. Vertebral arteries: LEFT vertebral dominant. Both are patent. No ostial narrowing or stenosis in the neck. Skeleton: No worrisome osseous lesion. C5-6 ACDF appears well-healed. Other neck: No neck masses. Upper chest: No pneumothorax or mass. Review of the MIP images confirms the above findings CTA HEAD  FINDINGS Anterior circulation: No significant stenosis, proximal occlusion, aneurysm, or vascular malformation. Nonstenotic calcific atheromatous change in the cavernous internal carotid arteries bilaterally. Posterior circulation: No significant stenosis, proximal occlusion, aneurysm, or vascular malformation. Minor atheromatous change distal LEFT vertebral. Venous sinuses: As permitted by contrast timing, patent. Anatomic variants: None of significance. Delayed phase: No abnormal intracranial enhancement. Review of the MIP images confirms the above findings IMPRESSION: No intracranial or extracranial flow-limiting stenosis, occlusion, or dissection. Calcific nonstenotic atrial and intracranial atherosclerotic disease as described. No abnormal intracranial enhancement. Asymmetric LEFT anterior internal capsule/basal ganglia hypodensity remains of indeterminate age,  but likely represents chronic ischemia which has developed since December of 2018. Electronically Signed   By: Staci Righter M.D.   On: 06/29/2018 12:17     CBC Recent Labs  Lab 06/28/18 2148 06/29/18 0556 07/01/18 0408  WBC 8.8 9.8 7.8  HGB 15.9 15.2 14.5  HCT 45.5 44.2 42.5  PLT 123* 122* 127*  MCV 93.8 94.0 93.0  MCH 32.8 32.3 31.7  MCHC 34.9 34.4 34.1  RDW 12.3 12.3 11.8  LYMPHSABS 1.2  --   --   MONOABS 0.4  --   --   EOSABS 0.0  --   --   BASOSABS 0.0  --   --     Chemistries  Recent Labs  Lab 06/28/18 2148 06/29/18 0556 07/01/18 0408  NA 132* 135 137  K 3.6 3.5 3.2*  CL 98 103 103  CO2 26 25 22   GLUCOSE 167* 117* 81  BUN 18 19 17   CREATININE 1.17 1.05 1.01  CALCIUM 9.6 9.3 8.6*  AST 29 25  --   ALT 39 36  --   ALKPHOS 53 48  --   BILITOT 1.4* 1.6*  --    ------------------------------------------------------------------------------------------------------------------ No results for input(s): CHOL, HDL, LDLCALC, TRIG, CHOLHDL, LDLDIRECT in the last 72 hours.  No results found for:  HGBA1C ------------------------------------------------------------------------------------------------------------------ No results for input(s): TSH, T4TOTAL, T3FREE, THYROIDAB in the last 72 hours.  Invalid input(s): FREET3 ------------------------------------------------------------------------------------------------------------------ No results for input(s): VITAMINB12, FOLATE, FERRITIN, TIBC, IRON, RETICCTPCT in the last 72 hours.  Coagulation profile Recent Labs  Lab 06/28/18 2148 07/01/18 0408  INR 2.17 1.74    No results for input(s): DDIMER in the last 72 hours.  Cardiac Enzymes No results for input(s): CKMB, TROPONINI, MYOGLOBIN in the last 168 hours.  Invalid input(s): CK ------------------------------------------------------------------------------------------------------------------   Benito Mccreedy M.D on 07/01/2018 at 10:14 AM  Pager---[phone] Go to [email or web address] - password [email or web address] for contact info  [facility] - Office  [phone]

## 2018-07-01 NOTE — Progress Notes (Addendum)
Patient is awake however little more drowsy this morning.  Answer simple questions and oriented to the fact that he is in the hospital.  Attempted to do LP, however patient's INR still 1.7 Repeat INR this evening still high at 1.58  Signed out to Dr. Cheral Marker to perform lumbar puncture tomorrow.  Continue empiric antibiotics and acyclovir.  No charge

## 2018-07-01 NOTE — Progress Notes (Signed)
Pharmacy Antibiotic Note  Alex Maddox is a 67 y.o. male admitted on 06/28/2018 with meningitis / possible encephalitis.  Pharmacy has been consulted for vancomycin dosing.  Vancomycin trough this morning was 61mcg/ml.  Subtherapeutic for vancomycin goal of 15 - 20 mcg/ml.  Patient renal function has been stable entire admission.  Will increase vancomycin dose.   Plan: Increase vancomycin to 1000mg  IV every 12 hours Continue ceftriaxone 2g IV every 12 hours Continue acyclovir 870mg  IV every 8 hours Continue doxycycline 100mg  every 12 hours Vancomycin goal of 15 - 20 mcg/ml. Follow-up & monitor vancomycin troughs as needed, renal function, C&S, clinical improvement.   Height: 6' (182.9 cm) Weight: 192 lb 14.4 oz (87.5 kg) IBW/kg (Calculated) : 77.6  Temp (24hrs), Avg:98.2 F (36.8 C), Min:97.7 F (36.5 C), Max:98.6 F (37 C)  Recent Labs  Lab 06/28/18 2148 06/28/18 2202 06/29/18 0051 06/29/18 0556 07/01/18 0408 07/01/18 1011  WBC 8.8  --   --  9.8 7.8  --   CREATININE 1.17  --   --  1.05 1.01  --   LATICACIDVEN  --  2.19* 1.44  --   --   --   VANCOTROUGH  --   --   --   --   --  10*    Estimated Creatinine Clearance: 79 mL/min (by C-G formula based on SCr of 1.01 mg/dL).    No Known Allergies  Antimicrobials this admission: cefepime 9/19  >> 9/20 vancomycin 9/19 >> Acyclovir 9/20>> Ceftriaxone 9/20>> Doxycycline 9/20>>    Dose adjustments this admission: Vancomycin 750mg  q12h >> 1000mg  q12h   Microbiology results: 9/19 BCx2: NGTD 9/20  MRSA PCR: negative  Thank you for allowing pharmacy to be a part of this patient's care.  Tamela Gammon, PharmD 07/01/2018 12:33 PM PGY-1 Pharmacy Resident Direct Phone: [phone] Please check [email or web address] for unit-specific pharmacist phone numbers

## 2018-07-02 DIAGNOSIS — R51 Headache: Secondary | ICD-10-CM

## 2018-07-02 DIAGNOSIS — K5900 Constipation, unspecified: Secondary | ICD-10-CM

## 2018-07-02 DIAGNOSIS — D696 Thrombocytopenia, unspecified: Secondary | ICD-10-CM

## 2018-07-02 DIAGNOSIS — Z8673 Personal history of transient ischemic attack (TIA), and cerebral infarction without residual deficits: Secondary | ICD-10-CM

## 2018-07-02 DIAGNOSIS — I480 Paroxysmal atrial fibrillation: Secondary | ICD-10-CM

## 2018-07-02 DIAGNOSIS — Z950 Presence of cardiac pacemaker: Secondary | ICD-10-CM

## 2018-07-02 DIAGNOSIS — R509 Fever, unspecified: Secondary | ICD-10-CM

## 2018-07-02 DIAGNOSIS — G968 Other specified disorders of central nervous system: Secondary | ICD-10-CM

## 2018-07-02 DIAGNOSIS — Z8249 Family history of ischemic heart disease and other diseases of the circulatory system: Secondary | ICD-10-CM

## 2018-07-02 DIAGNOSIS — R4182 Altered mental status, unspecified: Secondary | ICD-10-CM

## 2018-07-02 DIAGNOSIS — Z7901 Long term (current) use of anticoagulants: Secondary | ICD-10-CM

## 2018-07-02 DIAGNOSIS — H9190 Unspecified hearing loss, unspecified ear: Secondary | ICD-10-CM

## 2018-07-02 DIAGNOSIS — I10 Essential (primary) hypertension: Secondary | ICD-10-CM

## 2018-07-02 LAB — CSF CELL COUNT WITH DIFFERENTIAL
Eosinophils, CSF: 2 % — ABNORMAL HIGH (ref 0–1)
Eosinophils, CSF: 1 % (ref 0–1)
LYMPHS CSF: 91 % — AB (ref 40–80)
Lymphs, CSF: 92 % — ABNORMAL HIGH (ref 40–80)
MONOCYTE-MACROPHAGE-SPINAL FLUID: 5 % — AB (ref 15–45)
MONOCYTE-MACROPHAGE-SPINAL FLUID: 6 % — AB (ref 15–45)
RBC Count, CSF: 1 /mm3 — ABNORMAL HIGH
RBC Count, CSF: 3 /mm3 — ABNORMAL HIGH
Segmented Neutrophils-CSF: 2 % (ref 0–6)
Segmented Neutrophils-CSF: 1 % (ref 0–6)
TUBE #: 4
Tube #: 1
WBC CSF: 130 /mm3 — AB (ref 0–5)
WBC CSF: 53 /mm3 — AB (ref 0–5)

## 2018-07-02 LAB — B. BURGDORFI ANTIBODIES: B burgdorferi Ab IgG+IgM: <0.91 {ISR} (ref 0.00–0.90)

## 2018-07-02 LAB — BASIC METABOLIC PANEL
ANION GAP: 7 (ref 5–15)
BUN: 11 mg/dL (ref 8–23)
CO2: 23 mmol/L (ref 22–32)
CREATININE: 0.94 mg/dL (ref 0.61–1.24)
Calcium: 8.5 mg/dL — ABNORMAL LOW (ref 8.9–10.3)
Chloride: 110 mmol/L (ref 98–111)
GFR calc non Af Amer: >60 mL/min (ref >60)
Glucose, Bld: 92 mg/dL (ref 70–99)
Potassium: 3.4 mmol/L — ABNORMAL LOW (ref 3.5–5.1)
SODIUM: 140 mmol/L (ref 135–145)

## 2018-07-02 LAB — CBC
HCT: 44.3 % (ref 39.0–52.0)
HEMOGLOBIN: 15.3 g/dL (ref 13.0–17.0)
MCH: 31.8 pg (ref 26.0–34.0)
MCHC: 34.5 g/dL (ref 30.0–36.0)
MCV: 92.1 fL (ref 78.0–100.0)
Platelets: 145 10*3/uL — ABNORMAL LOW (ref 150–400)
RBC: 4.81 MIL/uL (ref 4.22–5.81)
RDW: 12 % (ref 11.5–15.5)
WBC: 7.3 10*3/uL (ref 4.0–10.5)

## 2018-07-02 LAB — PROTIME-INR
INR: 1.34
PROTHROMBIN TIME: 16.5 s — AB (ref 11.4–15.2)

## 2018-07-02 LAB — PROTEIN AND GLUCOSE, CSF
GLUCOSE CSF: 54 mg/dL (ref 40–70)
TOTAL PROTEIN, CSF: 53 mg/dL — AB (ref 15–45)

## 2018-07-02 LAB — EHRLICHIA ANTIBODY PANEL
E CHAFFEENSIS AB, IGM: NEGATIVE
E chaffeensis (HGE) Ab, IgG: NEGATIVE
E. CHAFFEENSIS (HME) IGM TITER: NEGATIVE
E. CHAFFEENSIS IGG AB: NEGATIVE

## 2018-07-02 LAB — CRYPTOCOCCAL ANTIGEN, CSF: Crypto Ag: NEGATIVE

## 2018-07-02 MED ORDER — POLYVINYL ALCOHOL 1.4 % OP SOLN
1.0000 [drp] | OPHTHALMIC | Status: DC | PRN
  Administered 2018-07-03 – 2018-07-04 (×2): 1 [drp] via OPHTHALMIC
  Filled 2018-07-02: qty 15

## 2018-07-02 MED ORDER — DEXTROSE 5 % IV SOLN
870.0000 mg | Freq: Three times a day (TID) | INTRAVENOUS | Status: DC
  Administered 2018-07-02 – 2018-07-04 (×5): 870 mg via INTRAVENOUS
  Filled 2018-07-02 (×7): qty 17.4

## 2018-07-02 NOTE — Procedures (Signed)
Indication: meningitis  Risks of the procedure were dicussed with the patient including post-LP headache, bleeding, infection, weakness/numbness of legs(radiculopathy), death.  The patient & patient's proxy agreed and written consent was obtained.   The patient was prepped and draped, and using sterile technique a 20 gauge quinke spinal needle was inserted in the L3/L4 space.  Approximately 12 cc of CSF were obtained and sent for analysis.   No complications were encountered and patient handled well.   Etta Quill PA-C [facility] [phone]  M-F  (9:00 am- 5:00 PM)  07/02/2018, 11:39 AM

## 2018-07-02 NOTE — Progress Notes (Signed)
Subjective: Patient alert complaining of no neck pain. Moving neck supple.   Exam: Vitals:   07/02/18 0738 07/02/18 1138  BP: (!) 165/93 131/83  Pulse: 66 72  Resp: 18 18  Temp: 98.2 F (36.8 C)   SpO2: 98% 93%    Physical Exam   HEENT-  Normocephalic, no lesions, without obvious abnormality.  Normal external eye and conjunctiva.   Extremities- Warm, dry and intact Musculoskeletal-no joint tenderness, deformity or swelling Skin-warm and dry, no hyperpigmentation, vitiligo, or suspicious lesions    Neuro:  Mental Status: Alert, oriented, thought content appropriate.  Speech fluent without evidence of aphasia.  Able to follow 3 step commands without difficulty. Cranial Nerves: II:  Visual fields grossly normal,  III,IV, VI: ptosis not present, extra-ocular motions intact bilaterally pupils equal, round, reactive to light and accommodation V,VII: smile symmetric, facial light touch sensation normal bilaterally VIII: hearing decreased IX,X: uvula rises midline XI: bilateral shoulder shrug XII: midline tongue extension Motor: Right : Upper extremity   5/5    Left:     Upper extremity   5/5  Lower extremity   5/5     Lower extremity   5/5 Tone and bulk:normal tone throughout; no atrophy noted Sensory: Pinprick and light touch intact throughout, bilaterally Deep Tendon Reflexes: 2+ and symmetric throughout Plantars: Right: downgoing   Left: downgoing      Medications:  Scheduled: . amLODipine  5 mg Oral Daily  . carvedilol  12.5 mg Oral BID WC  . finasteride  5 mg Oral Daily  . irbesartan  300 mg Oral Daily   Continuous: . sodium chloride 75 mL/hr at 06/29/18 1615  . acyclovir 870 mg (07/01/18 2358)  . cefTRIAXone (ROCEPHIN)  IV 2 g (07/02/18 0401)  . doxycycline (VIBRAMYCIN) IV 100 mg (07/02/18 0404)  . levETIRAcetam 500 mg (07/02/18 0853)  . vancomycin 1,000 mg (07/02/18 0007)    Pertinent Labs/Diagnostics: INR 1.3 PLT 145    Etta Quill PA-C [facility] [phone]   Assessment: 67 year old male with a history of headache, fever, altered mental status which clinically improved with antibiotics.  I think clinically this has the appearance of meningitis, and his right frontocentral epileptiform discharge could be simply manifestation of irritability due to infection.  Today he is alert and oriented.  Hypodensity on CT could be an old stroke, but I think this is unclear and I do think an MRI with and without contrast is indicated.  LP was obtained with 3 tubes of 3 ml CSF    Recommendations: --continue KEppra 500 mg dily --MRI brain with and without contrast --Continue ABX until labs have returned--would consider ID involvement.      07/02/2018, 11:40 AM

## 2018-07-02 NOTE — Progress Notes (Signed)
Pharmacy Antibiotic Note  Alex Maddox is a 67 y.o. male admitted on 06/28/2018 with sepsis  and possible meningitis.  Pharmacy has been consulted for vancomycin, ceftriaxone and acyclovir dosing.  Plan: Continue ceftriaxone 2g IV q12h Continue acyclovir 870mg  IV q8h Continue vancomycin 1000mg  IV q12h Goal trough range:  15-20   mcg/mL Pharmacy will continue to monitor renal function, vancomycin troughs, cultures and patient progress.    Height: 6' (182.9 cm) Weight: 192 lb 14.4 oz (87.5 kg) IBW/kg (Calculated) : 77.6  Temp (24hrs), Avg:98.5 F (36.9 C), Min:98.2 F (36.8 C), Max:98.8 F (37.1 C)  Recent Labs  Lab 06/28/18 2148 06/28/18 2202 06/29/18 0051 06/29/18 0556 07/01/18 0408 07/01/18 1011 07/02/18 0559  WBC 8.8  --   --  9.8 7.8  --  7.3  CREATININE 1.17  --   --  1.05 1.01  --  0.94  LATICACIDVEN  --  2.19* 1.44  --   --   --   --   VANCOTROUGH  --   --   --   --   --  10*  --     Estimated Creatinine Clearance: 84.8 mL/min (by C-G formula based on SCr of 0.94 mg/dL).    No Known Allergies  Antimicrobials this admission: cefepime 9/19  >> 9/20 vancomycin 9/19 >>   Acyclovir 9/20>> ceftriaxone 9/20>>  Microbiology results: 9/19 BCx2: ngtd  9/20  MRSA PCR: negative  Dylen Mcelhannon A. Levada Dy, PharmD, [place] Pager: [phone] Please utilize Amion for appropriate phone number to reach the unit pharmacist ([place])  07/02/2018 8:32 AM

## 2018-07-02 NOTE — Progress Notes (Signed)
PROGRESS NOTE  Alex Maddox  JFH:[id number] DOB: 09/26/1951 DOA: 06/28/2018 PCP: Redmond School, MD   Brief Narrative: Alex Maddox is a 67 y.o. male with a history of PAF on coumadin, recurrent neurally mediated syncope s/p PPM, and TIA who was admitted 9/20 with AMS, fever, headache and concern for CNS infection vs. CVA. Initial CT suggested stroke, though this was not seen on follow up CTA head and neck. MRI initially thought to be contraindicated due to pacemaker, though this is MRI conditional. Neurology was consulted and the patient transferred from [facility] to [facility]. LP was recommended though INR was therapeutic, and LP has been delayed until INR below 1.5, performed 9/23. Empiric vancomycin, ceftriaxone were started for meningitis, acyclovir later added for possible encephalitis. Due to his frequent outdoor exposures, tickborne illness was consider for which doxycycline was added. There is no leukocytosis and patient has been afebrile since the day of admission when oral temperature was 103.70F. Mental status is returning to baseline, though the patient feels weak, and headache has resolved. ID is consulted for further recommendations.   Assessment & Plan: Principal Problem:   Acute Toxic metabolic encephalopathy due to infection Active Problems:   Atrial fibrillation ([place])   Long term current use of anticoagulant therapy   Pacemaker - Medtronic REVO dual-chamber implanted August 2012   HTN (hypertension)   Thrombocytopenia ([facility])   Sepsis ([place])   Fever in adult >> 103   Suspected CNS infection  Acute toxic metabolic encephalopathy with concern for meningitis/encephalitis: Mentation improving. Neuroimaging negative for definitive CVA. Ehrlichiosis, Borrelia serologies negative.  - On broad antimicrobials which seem to be effective. Unsure if delayed LP will give definitive organism. Spoke with Dr. Johnnye Sima, ID. I appreciate assistance with tailoring antimicrobial therapy from here - Follow up  CSF studies. - Per cardiology notes, PPM is dual-chamber Medtronic MRI conditional permanent pacemaker placed August 2012. Can order MRI. - Follow up RMSF IgG, IgM and final blood culture results (NG4D)  Paroxysmal atrial fibrillation: With TIA, HTN, age, CHA2DS2-VASc score is 4.  - Will bridge with heparin once cleared per neurology while restarting coumadin.  - Continue coreg, telemetry currently showing NSR.  HTN: BPs elevated - Norvasc increased 2.5mg  > 5mg  - Continue coreg 12.5mg  BID, irbesartan 300mg   Hyperlipidemia:  - Continue statin  BPH:  - Continue finasteride  DVT prophylaxis: Heparin Code Status: Full Family Communication: Wife at bedside Disposition Plan: Home when work up complete and pt stable.  Consultants:   Neurology  Infectious disease  Procedures:   LP 9/23  Antimicrobials:  Vancomycin 9/19 >>  Cefepime 9/19 - 9/20  Ceftriaxone 9/20 >>   Doxycycline 9/20 >>   Acyclovir 9/20 >>   Subjective: No headache, neck pain or stiffness, fever, chills, rashes myalgias, or bleeding or new neurological deficits. Wife states he's returning to mental baseline.  Objective: Vitals:   07/01/18 2333 07/02/18 0312 07/02/18 0738 07/02/18 1138  BP: (!) 137/93 (!) 140/94 (!) 165/93 131/83  Pulse: 68 72 66 72  Resp: 18 18 18 18   Temp: 98.8 F (37.1 C) 98.6 F (37 C) 98.2 F (36.8 C)   TempSrc: Oral Oral Oral   SpO2: 97% 99% 98% 93%  Weight:      Height:        Intake/Output Summary (Last 24 hours) at 07/02/2018 1429 Last data filed at 07/02/2018 0748 Gross per 24 hour  Intake 2320.25 ml  Output 4575 ml  Net -2254.75 ml   [employer]  06/28/18 2124 06/29/18 0500  Weight: 90.7 kg 87.5 kg    Gen: 67 y.o. male in no distress  Pulm: Non-labored breathing. Clear to auscultation bilaterally.  CV: Regular rate and rhythm. No murmur, rub, or gallop. No JVD, no pedal edema. GI: Abdomen soft, non-tender, non-distended, with normoactive bowel sounds.  No organomegaly or masses felt. Ext: Warm, no deformities Skin: No rashes, lesions or ulcers. Right upper chest pacemaker nontender, no erythema/induration. Neuro: Alert and oriented. No focal neurological deficits. Psych: Judgement and insight appear normal. Mood & affect appropriate.   Data Reviewed: I have personally reviewed following labs and imaging studies  CBC: Recent Labs  Lab 06/28/18 2148 06/29/18 0556 07/01/18 0408 07/02/18 0559  WBC 8.8 9.8 7.8 7.3  NEUTROABS 7.2  --   --   --   HGB 15.9 15.2 14.5 15.3  HCT 45.5 44.2 42.5 44.3  MCV 93.8 94.0 93.0 92.1  PLT 123* 122* 127* 412*   Basic Metabolic Panel: Recent Labs  Lab 06/28/18 2148 06/29/18 0556 07/01/18 0408 07/02/18 0559  NA 132* 135 137 140  K 3.6 3.5 3.2* 3.4*  CL 98 103 103 110  CO2 26 25 22 23   GLUCOSE 167* 117* 81 92  BUN 18 19 17 11   CREATININE 1.17 1.05 1.01 0.94  CALCIUM 9.6 9.3 8.6* 8.5*   GFR: Estimated Creatinine Clearance: 84.8 mL/min (by C-G formula based on SCr of 0.94 mg/dL). Liver Function Tests: Recent Labs  Lab 06/28/18 2148 06/29/18 0556  AST 29 25  ALT 39 36  ALKPHOS 53 48  BILITOT 1.4* 1.6*  PROT 7.5 7.0  ALBUMIN 4.1 3.8   No results for input(s): LIPASE, AMYLASE in the last 168 hours. No results for input(s): AMMONIA in the last 168 hours. Coagulation Profile: Recent Labs  Lab 06/28/18 2148 07/01/18 0408 07/01/18 1628 07/02/18 0757  INR 2.17 1.74 1.58 1.34   Cardiac Enzymes: No results for input(s): CKTOTAL, CKMB, CKMBINDEX, TROPONINI in the last 168 hours. BNP (last 3 results) No results for input(s): PROBNP in the last 8760 hours. HbA1C: No results for input(s): HGBA1C in the last 72 hours. CBG: Recent Labs  Lab 06/28/18 2122 06/30/18 0431  GLUCAP 147* 111*   Lipid Profile: No results for input(s): CHOL, HDL, LDLCALC, TRIG, CHOLHDL, LDLDIRECT in the last 72 hours. Thyroid Function Tests: No results for input(s): TSH, T4TOTAL, FREET4, T3FREE, THYROIDAB  in the last 72 hours. Anemia Panel: No results for input(s): VITAMINB12, FOLATE, FERRITIN, TIBC, IRON, RETICCTPCT in the last 72 hours. Urine analysis:    Component Value Date/Time   COLORURINE YELLOW 06/28/2018 2310   APPEARANCEUR HAZY (A) 06/28/2018 2310   LABSPEC 1.013 06/28/2018 2310   PHURINE 7.0 06/28/2018 2310   GLUCOSEU 150 (A) 06/28/2018 2310   HGBUR NEGATIVE 06/28/2018 2310   BILIRUBINUR NEGATIVE 06/28/2018 2310   KETONESUR 5 (A) 06/28/2018 2310   PROTEINUR 30 (A) 06/28/2018 2310   UROBILINOGEN 2.0 (H) 03/18/2009 1704   NITRITE NEGATIVE 06/28/2018 2310   LEUKOCYTESUR NEGATIVE 06/28/2018 2310   Recent Results (from the past 240 hour(s))  Blood Culture (routine x 2)     Status: None (Preliminary result)   Collection Time: 06/28/18  9:48 PM  Result Value Ref Range Status   Specimen Description BLOOD LEFT HAND  Final   Special Requests   Final    BOTTLES DRAWN AEROBIC AND ANAEROBIC Blood Culture adequate volume   Culture   Final    NO GROWTH 4 DAYS Performed at [facility],  [address], [place], [place] [address]    Report Status PENDING  Incomplete  Blood Culture (routine x 2)     Status: None (Preliminary result)   Collection Time: 06/28/18  9:53 PM  Result Value Ref Range Status   Specimen Description BLOOD RIGHT FOREARM  Final   Special Requests   Final    Blood Culture adequate volume BOTTLES DRAWN AEROBIC AND ANAEROBIC   Culture   Final    NO GROWTH 4 DAYS Performed at [facility], [address] [place], [place] [address]    Report Status PENDING  Incomplete  MRSA PCR Screening     Status: None   Collection Time: 06/29/18  3:58 AM  Result Value Ref Range Status   MRSA by PCR NEGATIVE NEGATIVE Final    Comment:        The GeneXpert MRSA Assay (FDA approved for NASAL specimens only), is one component of a comprehensive MRSA colonization surveillance program. It is not intended to diagnose MRSA infection nor to guide or monitor treatment for MRSA  infections. Performed at [facility], [address], [place], [place] [address]       Radiology Studies: No results found.  Scheduled Meds: . amLODipine  5 mg Oral Daily  . carvedilol  12.5 mg Oral BID WC  . finasteride  5 mg Oral Daily  . irbesartan  300 mg Oral Daily   Continuous Infusions: . sodium chloride 75 mL/hr at 06/29/18 1615  . acyclovir    . cefTRIAXone (ROCEPHIN)  IV 2 g (07/02/18 0401)  . doxycycline (VIBRAMYCIN) IV 100 mg (07/02/18 0404)  . levETIRAcetam 500 mg (07/02/18 0853)  . vancomycin 1,000 mg (07/02/18 0007)     LOS: 3 days   Time spent: 25 minutes.  Patrecia Pour, MD [facility] [email or web address] Password [facility] 07/02/2018, 2:29 PM

## 2018-07-02 NOTE — Progress Notes (Signed)
CRITICAL VALUE ALERT  Critical Value:  CSF tube #1 53 white cell & Tube #4 130 white cells  Date & Time Notied:  07/02/2018 @330pm   Provider Notified: MD Bonner Puna  Orders Received/Actions taken: nurse will continue monitor while waiting for orders.

## 2018-07-02 NOTE — Consult Note (Addendum)
[facility] for Infectious Disease    Date of Admission:  06/28/2018   Total days of antibiotics: 3               Reason for Consult: Menigitis (vs CVA)    Referring Provider: Bonner Puna   Assessment: Mental status change, fever, headache  Plan: 1. No change in anbx for now 2. Await CSF results- WBC, prot, glc, Cx 3. Await HSV CSF PCR 4. Await enterovirus CSF PCR 5. Check RMSF on serum (no rash though...) 6. Check  arbovirus panel. This would be the season for this.  7. He has gotten > 24 h of anbx, can stop his isolation.  8. MRI brain pending.    Comment- His CSF WBC should be still elevated (though Neutrophils may have been replaced by Lymphocytes now) and his CSF protein should be elevated if he as bacterial meningitis.   Per his wife he is "100% back" to his baseline.   Thank you so much for this interesting consult,  Principal Problem:   Acute Toxic metabolic encephalopathy due to infection Active Problems:   Atrial fibrillation [facility])   Long term current use of anticoagulant therapy   Pacemaker - Medtronic REVO dual-chamber implanted August 2012   HTN (hypertension)   Thrombocytopenia (HCC)   Sepsis ([place])   Fever in adult >> 103   Suspected CNS infection   . amLODipine  5 mg Oral Daily  . carvedilol  12.5 mg Oral BID WC  . finasteride  5 mg Oral Daily  . irbesartan  300 mg Oral Daily    HPI: Alex Maddox is a 67 y.o. male with hx of pacer, TIA, parox afib, adm to [facility] then transferred to [facility] on 9-20 with several days of fever, mental status change and headache. In ED his temp was 103. His CT on adm showed a low attenuation lesion in L internal capsule.  He did not have LP on adm due to coagulopthy from his coumadin.  He was started on vanco/cefepime then changed to vanco/ceftriaxone/acyclovir.  Doxy was later added for consideration of tick borne illnesses.  He underwent LP today.  His mentation is back to baseline.   His hx is  also notable for outdoor exposures, hunting. On day prior to arrival he was "bush-hogging"  Ehrlichia (-) HIV (-) CSF Crypto (-)  Review of Systems: Review of Systems  Constitutional: Positive for fever.  HENT: Positive for hearing loss.   Eyes: Negative for blurred vision.  Respiratory: Negative for cough and shortness of breath.   Gastrointestinal: Positive for constipation. Negative for diarrhea.  Genitourinary: Negative for dysuria.  Pruritic rash on R elbow.  Denies but/tick bites.   Past Medical History:  Diagnosis Date  . Abnormal echocardiogram    stress myoview 10/29/2010  . Arthritis    " IN MY BACK "  . Dyslipidemia   . Dysrhythmia    Atrial fibrillation  . Hypertension   . Paroxysmal atrial fibrillation (HCC)   . Presence of permanent cardiac pacemaker   . Sinus node dysfunction (HCC)   . Sleep apnea, obstructive    sleep study 10/09/2007  AHI 5.73/hr  REM AHI 11.90/hr   . 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   LEFT THUMB   . Stroke ([place])   . Syncope    2D Echocardiogram 09/27/2010 EF between 40 to 45%  . Tachycardia-bradycardia ([place])   . Transient ischemic attack (TIA)  06/07    Social History   Tobacco Use  . Smoking status: Never Smoker  . Smokeless tobacco: Never Used  Substance Use Topics  . Alcohol use: Yes    Comment: occasionally  . Drug use: No    Family History  Problem Relation Age of Onset  . Hypertension Mother   . Heart attack Father 51       deceased from heart attack  . Hypertension Brother   . Colon cancer Neg Hx      Medications:  Scheduled: . amLODipine  5 mg Oral Daily  . carvedilol  12.5 mg Oral BID WC  . finasteride  5 mg Oral Daily  . irbesartan  300 mg Oral Daily    Abtx:  Anti-infectives (From admission, onward)   Start     Dose/Rate Route Frequency Ordered Stop   07/02/18 2200  acyclovir (ZOVIRAX) 870 mg in dextrose 5 % 150 mL IVPB     870 mg 167.4 mL/hr over 60 Minutes Intravenous Every 8 hours  07/02/18 1328     07/01/18 2200  vancomycin (VANCOCIN) IVPB 1000 mg/200 mL premix  Status:  Discontinued     1,000 mg 200 mL/hr over 60 Minutes Intravenous Every 12 hours 07/01/18 1202 07/01/18 1216   07/01/18 1230  vancomycin (VANCOCIN) IVPB 1000 mg/200 mL premix     1,000 mg 200 mL/hr over 60 Minutes Intravenous Every 12 hours 07/01/18 1216     06/29/18 1600  doxycycline (VIBRAMYCIN) 100 mg in sodium chloride 0.9 % 250 mL IVPB     100 mg 125 mL/hr over 120 Minutes Intravenous Every 12 hours 06/29/18 1409     06/29/18 1500  acyclovir (ZOVIRAX) 870 mg in dextrose 5 % 150 mL IVPB  Status:  Discontinued     870 mg 167.4 mL/hr over 60 Minutes Intravenous Every 8 hours 06/29/18 1401 07/02/18 1328   06/29/18 1430  cefTRIAXone (ROCEPHIN) 2 g in sodium chloride 0.9 % 100 mL IVPB  Status:  Discontinued     2 g 200 mL/hr over 30 Minutes Intravenous Every 12 hours 06/29/18 1416 06/29/18 1416   06/29/18 1400  cefTRIAXone (ROCEPHIN) 2 g in sodium chloride 0.9 % 100 mL IVPB     2 g 200 mL/hr over 30 Minutes Intravenous Every 12 hours 06/29/18 1355     06/29/18 1100  ceFEPIme (MAXIPIME) 2 g in sodium chloride 0.9 % 100 mL IVPB  Status:  Discontinued     2 g 200 mL/hr over 30 Minutes Intravenous Every 12 hours 06/29/18 0908 06/29/18 1354   06/29/18 1000  vancomycin (VANCOCIN) IVPB 750 mg/150 ml premix  Status:  Discontinued     750 mg 150 mL/hr over 60 Minutes Intravenous Every 12 hours 06/29/18 0908 07/01/18 1202   06/28/18 2230  vancomycin (VANCOCIN) 1,750 mg in sodium chloride 0.9 % 500 mL IVPB     1,750 mg 250 mL/hr over 120 Minutes Intravenous  Once 06/28/18 2217 06/29/18 0157   06/28/18 2230  ceFEPIme (MAXIPIME) 2 g in sodium chloride 0.9 % 100 mL IVPB     2 g 200 mL/hr over 30 Minutes Intravenous  Once 06/28/18 2217 06/28/18 2319        OBJECTIVE: Blood pressure 131/83, pulse 72, temperature 98.2 F (36.8 C), temperature source Oral, resp. rate 18, height 6' (1.829 m), weight 87.5 kg,  SpO2 93 %.  Physical Exam  Lab Results Results for orders placed or performed during the hospital encounter of 06/28/18 (from the past 48  hour(s))  CBC     Status: Abnormal   Collection Time: 07/01/18  4:08 AM  Result Value Ref Range   WBC 7.8 4.0 - 10.5 K/uL   RBC 4.57 4.22 - 5.81 MIL/uL   Hemoglobin 14.5 13.0 - 17.0 g/dL   HCT 42.5 39.0 - 52.0 %   MCV 93.0 78.0 - 100.0 fL   MCH 31.7 26.0 - 34.0 pg   MCHC 34.1 30.0 - 36.0 g/dL   RDW 11.8 11.5 - 15.5 %   Platelets 127 (L) 150 - 400 K/uL    Comment: Performed at [facility], [place] [address], [place], [place] [address]  Basic metabolic panel     Status: Abnormal   Collection Time: 07/01/18  4:08 AM  Result Value Ref Range   Sodium 137 135 - 145 mmol/L   Potassium 3.2 (L) 3.5 - 5.1 mmol/L   Chloride 103 98 - 111 mmol/L   CO2 22 22 - 32 mmol/L   Glucose, Bld 81 70 - 99 mg/dL   BUN 17 8 - 23 mg/dL   Creatinine, Ser 1.01 0.61 - 1.24 mg/dL   Calcium 8.6 (L) 8.9 - 10.3 mg/dL   GFR calc non Af Amer >60 >60 mL/min   GFR calc Af Amer >60 >60 mL/min    Comment: (NOTE) The eGFR has been calculated using the CKD EPI equation. This calculation has not been validated in all clinical situations. eGFR's persistently <60 mL/min signify possible Chronic Kidney Disease.    Anion gap 12 5 - 15    Comment: Performed at [facility] [address] [place], [place] [address]  Protime-INR     Status: Abnormal   Collection Time: 07/01/18  4:08 AM  Result Value Ref Range   Prothrombin Time 20.2 (H) 11.4 - 15.2 seconds   INR 1.74     Comment: Performed at [facility], [place] [address], [place], [place] [address]  Vancomycin, trough     Status: Abnormal   Collection Time: 07/01/18 10:11 AM  Result Value Ref Range   Vancomycin Tr 10 (L) 15 - 20 ug/mL    Comment: Performed at [facility], [place] [address], [place], [place] [address]  Protime-INR     Status: Abnormal   Collection Time: 07/01/18  4:28 PM  Result Value Ref  Range   Prothrombin Time 18.7 (H) 11.4 - 15.2 seconds   INR 1.58     Comment: Performed at [facility] [address] [place], [place] [address]  CBC     Status: Abnormal   Collection Time: 07/02/18  5:59 AM  Result Value Ref Range   WBC 7.3 4.0 - 10.5 K/uL   RBC 4.81 4.22 - 5.81 MIL/uL   Hemoglobin 15.3 13.0 - 17.0 g/dL   HCT 44.3 39.0 - 52.0 %   MCV 92.1 78.0 - 100.0 fL   MCH 31.8 26.0 - 34.0 pg   MCHC 34.5 30.0 - 36.0 g/dL   RDW 12.0 11.5 - 15.5 %   Platelets 145 (L) 150 - 400 K/uL    Comment: Performed at [facility], [place] [address], [place], [place] [address]  Basic metabolic panel     Status: Abnormal   Collection Time: 07/02/18  5:59 AM  Result Value Ref Range   Sodium 140 135 - 145 mmol/L   Potassium 3.4 (L) 3.5 - 5.1 mmol/L   Chloride 110 98 - 111 mmol/L   CO2 23  22 - 32 mmol/L   Glucose, Bld 92 70 - 99 mg/dL   BUN 11 8 - 23 mg/dL   Creatinine, Ser 0.94 0.61 - 1.24 mg/dL   Calcium 8.5 (L) 8.9 - 10.3 mg/dL   GFR calc non Af Amer >60 >60 mL/min   GFR calc Af Amer >60 >60 mL/min    Comment: (NOTE) The eGFR has been calculated using the CKD EPI equation. This calculation has not been validated in all clinical situations. eGFR's persistently <60 mL/min signify possible Chronic Kidney Disease.    Anion gap 7 5 - 15    Comment: Performed at [facility] [address] [place], [place] [address]  Protime-INR     Status: Abnormal   Collection Time: 07/02/18  7:57 AM  Result Value Ref Range   Prothrombin Time 16.5 (H) 11.4 - 15.2 seconds   INR 1.34     Comment: Performed at [facility] [address], [place], [place] [address]  CSF culture with Stat gram stain     Status: None (Preliminary result)   Collection Time: 07/02/18 11:54 AM  Result Value Ref Range   Specimen Description CSF    Special Requests NONE    Gram Stain      WBC PRESENT,BOTH PMN AND MONONUCLEAR NO ORGANISMS SEEN CYTOSPIN SMEAR Performed at [facility],  [place] [address], [place], [place] [address]    Culture PENDING    Report Status PENDING       Component Value Date/Time   SDES CSF 07/02/2018 1154   SPECREQUEST NONE 07/02/2018 1154   CULT PENDING 07/02/2018 1154   REPTSTATUS PENDING 07/02/2018 1154   No results found. Recent Results (from the past 240 hour(s))  Blood Culture (routine x 2)     Status: None (Preliminary result)   Collection Time: 06/28/18  9:48 PM  Result Value Ref Range Status   Specimen Description BLOOD LEFT HAND  Final   Special Requests   Final    BOTTLES DRAWN AEROBIC AND ANAEROBIC Blood Culture adequate volume   Culture   Final    NO GROWTH 4 DAYS Performed at [facility], [address] [place], [place] [address]    Report Status PENDING  Incomplete  Blood Culture (routine x 2)     Status: None (Preliminary result)   Collection Time: 06/28/18  9:53 PM  Result Value Ref Range Status   Specimen Description BLOOD RIGHT FOREARM  Final   Special Requests   Final    Blood Culture adequate volume BOTTLES DRAWN AEROBIC AND ANAEROBIC   Culture   Final    NO GROWTH 4 DAYS Performed at [facility], [address], [place], [place] [address]    Report Status PENDING  Incomplete  MRSA PCR Screening     Status: None   Collection Time: 06/29/18  3:58 AM  Result Value Ref Range Status   MRSA by PCR NEGATIVE NEGATIVE Final    Comment:        The GeneXpert MRSA Assay (FDA approved for NASAL specimens only), is one component of a comprehensive MRSA colonization surveillance program. It is not intended to diagnose MRSA infection nor to guide or monitor treatment for MRSA infections. Performed at [facility], [address], [place], [place] [address]   CSF culture with Stat gram stain     Status: None (Preliminary result)   Collection Time: 07/02/18 11:54 AM  Result Value Ref Range Status   Specimen Description CSF  Final   Special Requests NONE  Final   Gram Stain   Final    WBC PRESENT,BOTH PMN AND  MONONUCLEAR NO ORGANISMS SEEN CYTOSPIN SMEAR Performed at [facility], [address], [place], [place] [address]    Culture PENDING  Incomplete   Report Status PENDING  Incomplete    Microbiology: Recent Results (from the past 240 hour(s))  Blood Culture (routine x 2)     Status: None (Preliminary result)   Collection Time: 06/28/18  9:48 PM  Result Value Ref Range Status   Specimen Description BLOOD LEFT HAND  Final   Special Requests   Final    BOTTLES DRAWN AEROBIC AND ANAEROBIC Blood Culture adequate volume   Culture   Final    NO GROWTH 4 DAYS Performed at [facility], [address] [place], [place] [address]    Report Status PENDING  Incomplete  Blood Culture (routine x 2)     Status: None (Preliminary result)   Collection Time: 06/28/18  9:53 PM  Result Value Ref Range Status   Specimen Description BLOOD RIGHT FOREARM  Final   Special Requests   Final    Blood Culture adequate volume BOTTLES DRAWN AEROBIC AND ANAEROBIC   Culture   Final    NO GROWTH 4 DAYS Performed at [facility], [address] [place], [place] [address]    Report Status PENDING  Incomplete  MRSA PCR Screening     Status: None   Collection Time: 06/29/18  3:58 AM  Result Value Ref Range Status   MRSA by PCR NEGATIVE NEGATIVE Final    Comment:        The GeneXpert MRSA Assay (FDA approved for NASAL specimens only), is one component of a comprehensive MRSA colonization surveillance program. It is not intended to diagnose MRSA infection nor to guide or monitor treatment for MRSA infections. Performed at [facility], [address], [place], [place] [address]   CSF culture with Stat gram stain     Status: None (Preliminary result)   Collection Time: 07/02/18 11:54 AM  Result Value Ref Range Status   Specimen Description CSF  Final   Special Requests NONE  Final   Gram Stain   Final    WBC PRESENT,BOTH PMN AND MONONUCLEAR NO ORGANISMS SEEN CYTOSPIN SMEAR Performed at [facility], [place] [address], [place], [place] [address]    Culture PENDING  Incomplete   Report Status PENDING  Incomplete    Radiographs and labs were personally reviewed by me.   Bobby Rumpf, MD [facility] [place] [facility] [phone] 07/02/2018, 2:56 PM

## 2018-07-02 NOTE — Progress Notes (Signed)
Physical Therapy Treatment Patient Details Name: Alex Maddox MRN: [id number] DOB: 06/13/1951 Today's Date: 07/02/2018    History of Present Illness  Aser Nylund  is a 67 y.o. male, with history of atrial fibrillation on chronic anticoagulation with Coumadin, syncope, TIA, hypertension came to ED with chief complaint of headache which started this morning.  Patient does have a history of recurrent syncope, has a pacemaker in place. Being tested for infectious process, awaiting lumbar puncture.    PT Comments    Patient seen for mobility progression. Pt requires min guard/min A for OOB mobility including stair training. Pt unsteady initially with gait however no overt LOB and with increased distance pt demonstrated improved gait pattern. Gait training without AD or HHA this session. Continue to progress as tolerated.    Follow Up Recommendations  No PT follow up     Equipment Recommendations  None recommended by PT    Recommendations for Other Services OT consult     Precautions / Restrictions Precautions Precautions: Fall    Mobility  Bed Mobility Overal bed mobility: Needs Assistance Bed Mobility: Supine to Sit     Supine to sit: Min guard     General bed mobility comments: min guard for safety; increased time and effort   Transfers Overall transfer level: Needs assistance Equipment used: None Transfers: Sit to/from Stand Sit to Stand: Min guard         General transfer comment: min guard for safety; slightly unsteady upon standing but no physical assist required  Ambulation/Gait Ambulation/Gait assistance: Min assist Gait Distance (Feet): 300 Feet Assistive device: (assist at trunk with use of gait belt) Gait Pattern/deviations: Step-through pattern;Decreased stride length Gait velocity: decreased   General Gait Details: cues for posture/forward gaze and increased stride length; pt with increased cadence and bilat step lengths with increased distance; no  overt LOB   Stairs Stairs: Yes Stairs assistance: Min guard;Min assist Stair Management: One rail Left;Step to pattern;Forwards Number of Stairs: 2 General stair comments: poor eccentric loading with unsteadiness noted when descending   Wheelchair Mobility    Modified Rankin (Stroke Patients Only)       Balance Overall balance assessment: Needs assistance Sitting-balance support: No upper extremity supported Sitting balance-Leahy Scale: Good     Standing balance support: No upper extremity supported;During functional activity Standing balance-Leahy Scale: Fair                              Cognition Arousal/Alertness: Awake/alert Behavior During Therapy: Flat affect Overall Cognitive Status: Impaired/Different from baseline Area of Impairment: Following commands;Awareness;Problem solving                       Following Commands: Follows multi-step commands with increased time   Awareness: Emergent Problem Solving: Decreased initiation General Comments: pt more alert and participatory this session but remains slow to process      Exercises      General Comments        Pertinent Vitals/Pain Pain Assessment: No/denies pain    Home Living                      Prior Function            PT Goals (current goals can now be found in the care plan section) Acute Rehab PT Goals Patient Stated Goal: return home PT Goal Formulation: With patient/family Time For Goal Achievement: 07/14/18  Potential to Achieve Goals: Good Progress towards PT goals: Progressing toward goals    Frequency    Min 3X/week      PT Plan Current plan remains appropriate    Co-evaluation              AM-PAC PT "6 Clicks" Daily Activity  Outcome Measure  Difficulty turning over in bed (including adjusting bedclothes, sheets and blankets)?: A Little Difficulty moving from lying on back to sitting on the side of the bed? : A Little Difficulty  sitting down on and standing up from a chair with arms (e.g., wheelchair, bedside commode, etc,.)?: A Little Help needed moving to and from a bed to chair (including a wheelchair)?: A Little Help needed walking in hospital room?: A Little Help needed climbing 3-5 steps with a railing? : A Little 6 Click Score: 18    End of Session Equipment Utilized During Treatment: Gait belt Activity Tolerance: Patient tolerated treatment well Patient left: in chair;with call bell/phone within reach;with family/visitor present Nurse Communication: Mobility status PT Visit Diagnosis: Unsteadiness on feet (R26.81)     Time: 2263-3354 PT Time Calculation (min) (ACUTE ONLY): 27 min  Charges:  $Gait Training: 8-22 mins $Therapeutic Activity: 8-22 mins                     Earney Navy, PTA Acute Rehabilitation Services Pager: [phone] Office: [phone]     Darliss Cheney 07/02/2018, 3:06 PM

## 2018-07-03 ENCOUNTER — Inpatient Hospital Stay (HOSPITAL_COMMUNITY): Payer: PPO

## 2018-07-03 DIAGNOSIS — G039 Meningitis, unspecified: Secondary | ICD-10-CM

## 2018-07-03 DIAGNOSIS — I639 Cerebral infarction, unspecified: Secondary | ICD-10-CM

## 2018-07-03 DIAGNOSIS — G92 Toxic encephalopathy: Secondary | ICD-10-CM

## 2018-07-03 LAB — ROCKY MTN SPOTTED FVR ABS PNL(IGG+IGM)
RMSF IGG: NEGATIVE
RMSF IGM: 0.63 {index} (ref 0.00–0.89)

## 2018-07-03 LAB — CULTURE, BLOOD (ROUTINE X 2)
CULTURE: NO GROWTH
CULTURE: NO GROWTH
SPECIAL REQUESTS: ADEQUATE
Special Requests: ADEQUATE

## 2018-07-03 LAB — CSF IGG: IGG CSF: 4.9 mg/dL (ref 0.0–8.6)

## 2018-07-03 MED ORDER — ENOXAPARIN SODIUM 100 MG/ML ~~LOC~~ SOLN
90.0000 mg | Freq: Two times a day (BID) | SUBCUTANEOUS | Status: DC
  Administered 2018-07-03 – 2018-07-04 (×3): 90 mg via SUBCUTANEOUS
  Filled 2018-07-03 (×4): qty 0.9

## 2018-07-03 MED ORDER — WARFARIN SODIUM 5 MG PO TABS
5.0000 mg | ORAL_TABLET | Freq: Once | ORAL | Status: AC
  Administered 2018-07-03: 5 mg via ORAL
  Filled 2018-07-03: qty 1

## 2018-07-03 MED ORDER — WARFARIN - PHARMACIST DOSING INPATIENT: Freq: Every day | Status: DC

## 2018-07-03 MED ORDER — GADOBUTROL 1 MMOL/ML IV SOLN
8.0000 mL | Freq: Once | INTRAVENOUS | Status: AC | PRN
  Administered 2018-07-03: 8 mL via INTRAVENOUS

## 2018-07-03 MED ORDER — TRIAMCINOLONE ACETONIDE 0.1 % EX CREA
TOPICAL_CREAM | Freq: Three times a day (TID) | CUTANEOUS | Status: DC
  Administered 2018-07-03 (×2): via TOPICAL
  Administered 2018-07-03: 1 via TOPICAL
  Administered 2018-07-04 – 2018-07-05 (×4): via TOPICAL
  Filled 2018-07-03: qty 15

## 2018-07-03 NOTE — Progress Notes (Addendum)
INFECTIOUS DISEASE PROGRESS NOTE  ID: Alex Maddox is a 67 y.o. male with  Principal Problem:   Acute Toxic metabolic encephalopathy due to infection Active Problems:   Atrial fibrillation ([place])   Long term current use of anticoagulant therapy   Pacemaker - Medtronic REVO dual-chamber implanted August 2012   HTN (hypertension)   Thrombocytopenia (HCC)   Sepsis ([place])   Fever in adult >> 103   Suspected CNS infection  Subjective: Feels better, no complaints  Abtx:  Anti-infectives (From admission, onward)   Start     Dose/Rate Route Frequency Ordered Stop   07/02/18 2200  acyclovir (ZOVIRAX) 870 mg in dextrose 5 % 150 mL IVPB     870 mg 167.4 mL/hr over 60 Minutes Intravenous Every 8 hours 07/02/18 1328     07/01/18 2200  vancomycin (VANCOCIN) IVPB 1000 mg/200 mL premix  Status:  Discontinued     1,000 mg 200 mL/hr over 60 Minutes Intravenous Every 12 hours 07/01/18 1202 07/01/18 1216   07/01/18 1230  vancomycin (VANCOCIN) IVPB 1000 mg/200 mL premix  Status:  Discontinued     1,000 mg 200 mL/hr over 60 Minutes Intravenous Every 12 hours 07/01/18 1216 07/03/18 1006   06/29/18 1600  doxycycline (VIBRAMYCIN) 100 mg in sodium chloride 0.9 % 250 mL IVPB  Status:  Discontinued     100 mg 125 mL/hr over 120 Minutes Intravenous Every 12 hours 06/29/18 1409 07/03/18 0959   06/29/18 1500  acyclovir (ZOVIRAX) 870 mg in dextrose 5 % 150 mL IVPB  Status:  Discontinued     870 mg 167.4 mL/hr over 60 Minutes Intravenous Every 8 hours 06/29/18 1401 07/02/18 1328   06/29/18 1430  cefTRIAXone (ROCEPHIN) 2 g in sodium chloride 0.9 % 100 mL IVPB  Status:  Discontinued     2 g 200 mL/hr over 30 Minutes Intravenous Every 12 hours 06/29/18 1416 06/29/18 1416   06/29/18 1400  cefTRIAXone (ROCEPHIN) 2 g in sodium chloride 0.9 % 100 mL IVPB     2 g 200 mL/hr over 30 Minutes Intravenous Every 12 hours 06/29/18 1355     06/29/18 1100  ceFEPIme (MAXIPIME) 2 g in sodium chloride 0.9 % 100 mL IVPB   Status:  Discontinued     2 g 200 mL/hr over 30 Minutes Intravenous Every 12 hours 06/29/18 0908 06/29/18 1354   06/29/18 1000  vancomycin (VANCOCIN) IVPB 750 mg/150 ml premix  Status:  Discontinued     750 mg 150 mL/hr over 60 Minutes Intravenous Every 12 hours 06/29/18 0908 07/01/18 1202   06/28/18 2230  vancomycin (VANCOCIN) 1,750 mg in sodium chloride 0.9 % 500 mL IVPB     1,750 mg 250 mL/hr over 120 Minutes Intravenous  Once 06/28/18 2217 06/29/18 0157   06/28/18 2230  ceFEPIme (MAXIPIME) 2 g in sodium chloride 0.9 % 100 mL IVPB     2 g 200 mL/hr over 30 Minutes Intravenous  Once 06/28/18 2217 06/28/18 2319      Medications:  Scheduled: . amLODipine  5 mg Oral Daily  . carvedilol  12.5 mg Oral BID WC  . finasteride  5 mg Oral Daily  . irbesartan  300 mg Oral Daily    Objective: Vital signs in last 24 hours: Temp:  [97.9 F (36.6 C)-98.8 F (37.1 C)] 98.3 F (36.8 C) (09/24 0750) Pulse Rate:  [68-74] 71 (09/24 0750) Resp:  [16-18] 16 (09/24 0750) BP: (129-141)/(83-93) 130/87 (09/24 0750) SpO2:  [93 %-97 %] 97 % (09/24  11)   General appearance: alert, cooperative and no distress Resp: clear to auscultation bilaterally Cardio: regular rate and rhythm GI: normal findings: bowel sounds normal and soft, non-tender  Lab Results Recent Labs    07/01/18 0408 07/02/18 0559  WBC 7.8 7.3  HGB 14.5 15.3  HCT 42.5 44.3  NA 137 140  K 3.2* 3.4*  CL 103 110  CO2 22 23  BUN 17 11  CREATININE 1.01 0.94   Liver Panel No results for input(s): PROT, ALBUMIN, AST, ALT, ALKPHOS, BILITOT, BILIDIR, IBILI in the last 72 hours. Sedimentation Rate No results for input(s): ESRSEDRATE in the last 72 hours. C-Reactive Protein No results for input(s): CRP in the last 72 hours.  Microbiology: Recent Results (from the past 240 hour(s))  Blood Culture (routine x 2)     Status: None   Collection Time: 06/28/18  9:48 PM  Result Value Ref Range Status   Specimen Description BLOOD  LEFT HAND  Final   Special Requests   Final    BOTTLES DRAWN AEROBIC AND ANAEROBIC Blood Culture adequate volume   Culture   Final    NO GROWTH 5 DAYS Performed at [facility], [address] [place], [place] [address]    Report Status 07/03/2018 FINAL  Final  Blood Culture (routine x 2)     Status: None   Collection Time: 06/28/18  9:53 PM  Result Value Ref Range Status   Specimen Description BLOOD RIGHT FOREARM  Final   Special Requests   Final    Blood Culture adequate volume BOTTLES DRAWN AEROBIC AND ANAEROBIC   Culture   Final    NO GROWTH 5 DAYS Performed at [facility], [address], [place], [place] [address]    Report Status 07/03/2018 FINAL  Final  MRSA PCR Screening     Status: None   Collection Time: 06/29/18  3:58 AM  Result Value Ref Range Status   MRSA by PCR NEGATIVE NEGATIVE Final    Comment:        The GeneXpert MRSA Assay (FDA approved for NASAL specimens only), is one component of a comprehensive MRSA colonization surveillance program. It is not intended to diagnose MRSA infection nor to guide or monitor treatment for MRSA infections. Performed at [facility], [address], [place], [place] [address]   CSF culture with Stat gram stain     Status: None (Preliminary result)   Collection Time: 07/02/18 11:54 AM  Result Value Ref Range Status   Specimen Description CSF  Final   Special Requests NONE  Final   Gram Stain   Final    WBC PRESENT,BOTH PMN AND MONONUCLEAR NO ORGANISMS SEEN CYTOSPIN SMEAR Performed at [facility], [place] [address] [place], [place] [address]    Culture PENDING  Incomplete   Report Status PENDING  Incomplete    Studies/Results: No results found.   Assessment/Plan: Meningitis CVA  Total days of antibiotics: 4 vanco/ceftriaxone/Acyclovir/doxy  RMSF and Ehrlichia are (-), will stop doxy Will stop vanco Await his multiple studies and Cx hopefully home soon (no need to wait for viral studies) His lymphocyte  predominance raises several possibilities- 1) partially treated meningitis, 2) viral meningitis, 3) TB , 4) Cancer, 5) listeria meningitis (he has improved without amp) Explained to pt/family         Bobby Rumpf MD, FACP Infectious Diseases (pager) [phone] [email or web address] 07/03/2018, 10:12 AM  LOS: 4 days

## 2018-07-03 NOTE — Progress Notes (Signed)
ANTICOAGULATION CONSULT NOTE - Initial Consult  Pharmacy Consult for Enoxaparin and warfarin Indication: Atrial fibrillation  No Known Allergies  Patient Measurements: Height: 6' (182.9 cm) Weight: 192 lb 14.4 oz (87.5 kg) IBW/kg (Calculated) : 77.6   Vital Signs: Temp: 98.4 F (36.9 C) (09/24 1151) Temp Source: Oral (09/24 1151) BP: 119/80 (09/24 1151) Pulse Rate: 70 (09/24 1151)  Labs: Recent Labs    07/01/18 0408 07/01/18 1628 07/02/18 0559 07/02/18 0757  HGB 14.5  --  15.3  --   HCT 42.5  --  44.3  --   PLT 127*  --  145*  --   LABPROT 20.2* 18.7*  --  16.5*  INR 1.74 1.58  --  1.34  CREATININE 1.01  --  0.94  --     Estimated Creatinine Clearance: 84.8 mL/min (by C-G formula based on SCr of 0.94 mg/dL).   Medical History: Past Medical History:  Diagnosis Date  . Abnormal echocardiogram    stress myoview 10/29/2010  . Arthritis    " IN MY BACK "  . Dyslipidemia   . Dysrhythmia    Atrial fibrillation  . Hypertension   . Paroxysmal atrial fibrillation (HCC)   . Presence of permanent cardiac pacemaker   . Sinus node dysfunction (HCC)   . Sleep apnea, obstructive    sleep study 10/09/2007  AHI 5.73/hr  REM AHI 11.90/hr   . 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   LEFT THUMB   . Stroke ([place])   . Syncope    2D Echocardiogram 09/27/2010 EF between 40 to 45%  . Tachycardia-bradycardia ([place])   . Transient ischemic attack (TIA) 06/07  Assessment: 67 yo male with h/o PAF on warfarin admitted with AMS and fever with concern for CNS infection vs CVA. Patient's warfarin held for LP. Warfarin to be restarted with enoxaparin bridge. CrCl~85. PTA warfarin dose is 5mg  daily. INR 1.34 today. INR on 9/19 was 2.17.   Goal of Therapy:  INR 2-3 Monitor platelets by anticoagulation protocol: Yes   Plan:  Enoxaparin 90mg  SQ q12h Warfarin 5mg  x 1 Daily INR Monitor for s/sx of bleeding  Timberlyn Pickford A. Levada Dy, PharmD, [place] Pager:  [phone] Please utilize Amion for appropriate phone number to reach the unit pharmacist ([place])   07/03/2018,12:57 PM

## 2018-07-03 NOTE — Progress Notes (Signed)
Physical Therapy Treatment Patient Details Name: Alex Maddox MRN: [id number] DOB: 1951/09/22 Today's Date: 07/03/2018    History of Present Illness  Alex Maddox  is a 67 y.o. male, with history of atrial fibrillation on chronic anticoagulation with Coumadin, syncope, TIA, hypertension came to ED with chief complaint of headache which started this morning.  Patient does have a history of recurrent syncope, has a pacemaker in place. Being tested for infectious process, awaiting lumbar puncture.    PT Comments    Patient seen for mobility progression. Pt reports feeling better this am and eager to mobilize. Pt requires supervision/min guard overall for safety given continued balance deficits however pt demonstrates more steady gait this session than previous session. Continue to progress as tolerated.    Follow Up Recommendations  No PT follow up     Equipment Recommendations  None recommended by PT    Recommendations for Other Services OT consult     Precautions / Restrictions Precautions Precautions: Fall    Mobility  Bed Mobility Overal bed mobility: Needs Assistance Bed Mobility: Supine to Sit     Supine to sit: Supervision     General bed mobility comments: for safety; increased time and effort   Transfers Overall transfer level: Needs assistance Equipment used: None Transfers: Sit to/from Stand Sit to Stand: Supervision         General transfer comment: supervision for safety  Ambulation/Gait Ambulation/Gait assistance: Min guard Gait Distance (Feet): 400 Feet Assistive device: None Gait Pattern/deviations: Step-through pattern;Decreased stride length Gait velocity: decreased   General Gait Details: cues for posture/forward gaze and increased bilat step lengths; pt remains unsteady but no LOB or physical assistance required; pt reports balance deficits at baseline   Stairs             Wheelchair Mobility    Modified Rankin (Stroke Patients  Only)       Balance Overall balance assessment: Needs assistance Sitting-balance support: No upper extremity supported Sitting balance-Leahy Scale: Good     Standing balance support: No upper extremity supported;During functional activity Standing balance-Leahy Scale: Fair                              Cognition Arousal/Alertness: Awake/alert Behavior During Therapy: WFL for tasks assessed/performed Overall Cognitive Status: Within Functional Limits for tasks assessed Area of Impairment: Problem solving                             Problem Solving: Decreased initiation;Slow processing        Exercises      General Comments General comments (skin integrity, edema, etc.): wife present      Pertinent Vitals/Pain Pain Assessment: No/denies pain    Home Living                      Prior Function            PT Goals (current goals can now be found in the care plan section) Acute Rehab PT Goals Patient Stated Goal: return home Progress towards PT goals: Progressing toward goals    Frequency    Min 3X/week      PT Plan Current plan remains appropriate    Co-evaluation              AM-PAC PT "6 Clicks" Daily Activity  Outcome Measure  Difficulty turning over in bed (  including adjusting bedclothes, sheets and blankets)?: A Little Difficulty moving from lying on back to sitting on the side of the bed? : A Little Difficulty sitting down on and standing up from a chair with arms (e.g., wheelchair, bedside commode, etc,.)?: A Little Help needed moving to and from a bed to chair (including a wheelchair)?: A Little Help needed walking in hospital room?: A Little Help needed climbing 3-5 steps with a railing? : A Little 6 Click Score: 18    End of Session Equipment Utilized During Treatment: Gait belt Activity Tolerance: Patient tolerated treatment well Patient left: in chair;with call bell/phone within reach;with  family/visitor present Nurse Communication: Mobility status PT Visit Diagnosis: Unsteadiness on feet (R26.81)     Time: 0939-1000 PT Time Calculation (min) (ACUTE ONLY): 21 min  Charges:  $Gait Training: 8-22 mins                     Earney Navy, PTA Acute Rehabilitation Services Pager: [phone] Office: [phone]     Darliss Cheney 07/03/2018, 10:08 AM

## 2018-07-03 NOTE — Progress Notes (Signed)
PROGRESS NOTE  Alex Maddox  RDE:[id number] DOB: [birth year] DOA: 06/28/2018 PCP: Redmond School, MD   Brief Narrative: FREDDRICK Maddox is a 67 y.o. male with a history of PAF on coumadin, recurrent neurally mediated syncope s/p PPM, and TIA who was admitted 9/20 with AMS, fever, headache and concern for CNS infection vs. CVA. Initial CT suggested stroke, though this was not seen on follow up CTA head and neck. MRI initially thought to be contraindicated due to pacemaker, though this is MRI conditional. Neurology was consulted and the patient transferred from [facility] to [facility]. LP was recommended though INR was therapeutic, and LP has been delayed until INR below 1.5, performed 9/23. Empiric vancomycin, ceftriaxone were started for meningitis, acyclovir later added for possible encephalitis. Due to his frequent outdoor exposures, tickborne illness was consider for which doxycycline was added. There is no leukocytosis and patient has been afebrile since the day of admission when oral temperature was 103.9F. Mental status is returning to baseline, though the patient feels weak, and headache has resolved. ID is consulted for further recommendations.   Assessment & Plan: Principal Problem:   Acute Toxic metabolic encephalopathy due to infection Active Problems:   Atrial fibrillation ([place])   Long term current use of anticoagulant therapy   Pacemaker - Medtronic REVO dual-chamber implanted August 2012   HTN (hypertension)   Thrombocytopenia (HCC)   Sepsis ([place])   Fever in adult >> 103   Suspected CNS infection  Acute toxic metabolic encephalopathy with concern for meningitis/encephalitis: Mentation improving. Neuroimaging negative for definitive CVA. Ehrlichiosis, Borrelia serologies negative.  - ID consulted, discontinued vancomycin, doxycycline with negative RMSF titer. Continue on ceftriaxone and acyclovir pending final work up.  - Follow up CSF studies. - Per cardiology notes, PPM is dual-chamber  Medtronic MRI conditional permanent pacemaker placed August 2012. MRI is ordered and pending.  Paroxysmal atrial fibrillation: With TIA, HTN, age, CHA2DS2-VASc score is 4.  - Will bridge with lovenox and restart coumadin, cleared by neurology following LP. Does not need full 5 days prior to discharge.  - Continue coreg, telemetry currently showing NSR.  HTN: BPs elevated - Norvasc increased 2.5mg  > 5mg , improved control. - Continue coreg 12.5mg  BID, irbesartan 300mg   Hyperlipidemia:  - Continue statin  BPH:  - Continue finasteride  DVT prophylaxis: Heparin Code Status: Full Family Communication: Wife at bedside Disposition Plan: PT recommended no follow up. Will await CSF cultures, final ID recommendations prior to discharge home.   Consultants:   Neurology  Infectious disease  Procedures:   LP 9/23  Antimicrobials:  Vancomycin 9/19 >>  Cefepime 9/19 - 9/20  Ceftriaxone 9/20 >>   Doxycycline 9/20 >>   Acyclovir 9/20 >>   Subjective: No new complaints. Denies fever, neck pain or stiffness, confusion.   Objective: Vitals:   07/02/18 2347 07/03/18 0351 07/03/18 0750 07/03/18 1151  BP: (!) 141/84 (!) 141/93 130/87 119/80  Pulse: 74 68 71 70  Resp: 18 18 16 16   Temp: 98.4 F (36.9 C) 97.9 F (36.6 C) 98.3 F (36.8 C) 98.4 F (36.9 C)  TempSrc: Oral Oral Oral Oral  SpO2: 96% 97% 97% 95%  Weight:      Height:        Intake/Output Summary (Last 24 hours) at 07/03/2018 1239 Last data filed at 07/03/2018 1155 Gross per 24 hour  Intake 240 ml  Output 4225 ml  Net -3985 ml   Filed Weights   06/28/18 2124 06/29/18 0500  Weight: 90.7 kg 87.5 kg  Gen: 67 y.o. male in no distress Pulm: Nonlabored breathing room air. Clear. CV: Regular rate and rhythm. No murmur, rub, or gallop. No JVD, no dependent edema. GI: Abdomen soft, non-tender, non-distended, with normoactive bowel sounds.  Ext: Warm, no deformities Skin: No tenderness, erythema over PPM. No  rashes, lesions or ulcers on visualized skin.  Neuro: Alert and oriented. No focal neurological deficits. Psych: Judgement and insight appear fair. Mood euthymic & affect congruent. Behavior is appropriate.    Data Reviewed: I have personally reviewed following labs and imaging studies  CBC: Recent Labs  Lab 06/28/18 2148 06/29/18 0556 07/01/18 0408 07/02/18 0559  WBC 8.8 9.8 7.8 7.3  NEUTROABS 7.2  --   --   --   HGB 15.9 15.2 14.5 15.3  HCT 45.5 44.2 42.5 44.3  MCV 93.8 94.0 93.0 92.1  PLT 123* 122* 127* 268*   Basic Metabolic Panel: Recent Labs  Lab 06/28/18 2148 06/29/18 0556 07/01/18 0408 07/02/18 0559  NA 132* 135 137 140  K 3.6 3.5 3.2* 3.4*  CL 98 103 103 110  CO2 26 25 22 23   GLUCOSE 167* 117* 81 92  BUN 18 19 17 11   CREATININE 1.17 1.05 1.01 0.94  CALCIUM 9.6 9.3 8.6* 8.5*   GFR: Estimated Creatinine Clearance: 84.8 mL/min (by C-G formula based on SCr of 0.94 mg/dL). Liver Function Tests: Recent Labs  Lab 06/28/18 2148 06/29/18 0556  AST 29 25  ALT 39 36  ALKPHOS 53 48  BILITOT 1.4* 1.6*  PROT 7.5 7.0  ALBUMIN 4.1 3.8   No results for input(s): LIPASE, AMYLASE in the last 168 hours. No results for input(s): AMMONIA in the last 168 hours. Coagulation Profile: Recent Labs  Lab 06/28/18 2148 07/01/18 0408 07/01/18 1628 07/02/18 0757  INR 2.17 1.74 1.58 1.34   Cardiac Enzymes: No results for input(s): CKTOTAL, CKMB, CKMBINDEX, TROPONINI in the last 168 hours. BNP (last 3 results) No results for input(s): PROBNP in the last 8760 hours. HbA1C: No results for input(s): HGBA1C in the last 72 hours. CBG: Recent Labs  Lab 06/28/18 2122 06/30/18 0431  GLUCAP 147* 111*   Lipid Profile: No results for input(s): CHOL, HDL, LDLCALC, TRIG, CHOLHDL, LDLDIRECT in the last 72 hours. Thyroid Function Tests: No results for input(s): TSH, T4TOTAL, FREET4, T3FREE, THYROIDAB in the last 72 hours. Anemia Panel: No results for input(s): VITAMINB12,  FOLATE, FERRITIN, TIBC, IRON, RETICCTPCT in the last 72 hours. Urine analysis:    Component Value Date/Time   COLORURINE YELLOW 06/28/2018 2310   APPEARANCEUR HAZY (A) 06/28/2018 2310   LABSPEC 1.013 06/28/2018 2310   PHURINE 7.0 06/28/2018 2310   GLUCOSEU 150 (A) 06/28/2018 2310   HGBUR NEGATIVE 06/28/2018 2310   BILIRUBINUR NEGATIVE 06/28/2018 2310   KETONESUR 5 (A) 06/28/2018 2310   PROTEINUR 30 (A) 06/28/2018 2310   UROBILINOGEN 2.0 (H) 03/18/2009 1704   NITRITE NEGATIVE 06/28/2018 2310   LEUKOCYTESUR NEGATIVE 06/28/2018 2310   Recent Results (from the past 240 hour(s))  Blood Culture (routine x 2)     Status: None   Collection Time: 06/28/18  9:48 PM  Result Value Ref Range Status   Specimen Description BLOOD LEFT HAND  Final   Special Requests   Final    BOTTLES DRAWN AEROBIC AND ANAEROBIC Blood Culture adequate volume   Culture   Final    NO GROWTH 5 DAYS Performed at [facility], [address] [place], [place] [address]    Report Status 07/03/2018 FINAL  Final  Blood Culture (routine x 2)     Status: None   Collection Time: 06/28/18  9:53 PM  Result Value Ref Range Status   Specimen Description BLOOD RIGHT FOREARM  Final   Special Requests   Final    Blood Culture adequate volume BOTTLES DRAWN AEROBIC AND ANAEROBIC   Culture   Final    NO GROWTH 5 DAYS Performed at [facility], [address], [place], [place] [address]    Report Status 07/03/2018 FINAL  Final  MRSA PCR Screening     Status: None   Collection Time: 06/29/18  3:58 AM  Result Value Ref Range Status   MRSA by PCR NEGATIVE NEGATIVE Final    Comment:        The GeneXpert MRSA Assay (FDA approved for NASAL specimens only), is one component of a comprehensive MRSA colonization surveillance program. It is not intended to diagnose MRSA infection nor to guide or monitor treatment for MRSA infections. Performed at [facility], [address], [place], [place] [address]   CSF culture with Stat  gram stain     Status: None (Preliminary result)   Collection Time: 07/02/18 11:54 AM  Result Value Ref Range Status   Specimen Description CSF  Final   Special Requests NONE  Final   Gram Stain   Final    WBC PRESENT,BOTH PMN AND MONONUCLEAR NO ORGANISMS SEEN CYTOSPIN SMEAR    Culture   Final    NO GROWTH < 24 HOURS Performed at [facility], [place] [address], [place], [place] [address]    Report Status PENDING  Incomplete      Radiology Studies: No results found.  Scheduled Meds: . amLODipine  5 mg Oral Daily  . carvedilol  12.5 mg Oral BID WC  . finasteride  5 mg Oral Daily  . irbesartan  300 mg Oral Daily  . triamcinolone cream   Topical TID   Continuous Infusions: . sodium chloride 75 mL/hr at 07/03/18 0407  . acyclovir 870 mg (07/03/18 0641)  . cefTRIAXone (ROCEPHIN)  IV 2 g (07/03/18 0156)  . levETIRAcetam 500 mg (07/03/18 0836)     LOS: 4 days   Time spent: 25 minutes.  Patrecia Pour, MD [facility] [email or web address] Password [facility] 07/03/2018, 12:39 PM

## 2018-07-03 NOTE — Progress Notes (Addendum)
Subjective: Patient comfortable, brushing teeth, alert and oriented. No stiff neck.  Exam: Vitals:   07/03/18 0351 07/03/18 0750  BP: (!) 141/93 130/87  Pulse: 68 71  Resp: 18 16  Temp: 97.9 F (36.6 C) 98.3 F (36.8 C)  SpO2: 97% 97%    Physical Exam   HEENT-  Normocephalic, no lesions, without obvious abnormality.  Normal external eye and conjunctiva.   Extremities- Warm, dry and intact Musculoskeletal-no joint tenderness, deformity or swelling Skin-warm and dry, no hyperpigmentation   Neuro:  Mental Status: Alert, oriented, thought content appropriate.  Speech fluent without evidence of aphasia.  Able to follow 3 step commands without difficulty. Cranial Nerves: II:  Visual fields grossly normal,  III,IV, VI: ptosis not present, extra-ocular motions intact bilaterally pupils equal, round, reactive to light and accommodation V,VII: smile symmetric, facial light touch sensation normal bilaterally VIII: hearing normal bilaterally IX,X: uvula rises midline XI: bilateral shoulder shrug XII: midline tongue extension Motor: Right : Upper extremity   5/5    Left:     Upper extremity   5/5  Lower extremity   5/5     Lower extremity   5/5 Tone and bulk:normal tone throughout; no atrophy noted Sensory: Pinprick and light touch intact throughout, bilaterally Deep Tendon Reflexes: 2+ and symmetric throughout Plantars: Right: downgoing   Left: downgoing   Medications:  Scheduled: . amLODipine  5 mg Oral Daily  . carvedilol  12.5 mg Oral BID WC  . finasteride  5 mg Oral Daily  . irbesartan  300 mg Oral Daily   Continuous: . sodium chloride 75 mL/hr at 07/03/18 0407  . acyclovir 870 mg (07/03/18 0641)  . cefTRIAXone (ROCEPHIN)  IV 2 g (07/03/18 0156)  . doxycycline (VIBRAMYCIN) IV 100 mg (07/03/18 0405)  . levETIRAcetam 500 mg (07/03/18 0836)  . vancomycin 1,000 mg (07/03/18 0040)    Pertinent Labs/Diagnostics: Results for JAYZIAH, BANKHEAD (MRN [id number]) as of 07/03/2018  09:20  Ref. Range 07/02/2018 11:54 07/02/2018 11:54  Appearance, CSF Latest Ref Range: CLEAR  CLEAR CLEAR  Glucose, CSF Latest Ref Range: 40 - 70 mg/dL 54   RBC Count, CSF Latest Ref Range: 0 /cu mm 1 (H) 3 (H)  WBC, CSF Latest Ref Range: 0 - 5 /cu mm 53 (HH) 130 (HH)  Segmented Neutrophils-CSF Latest Ref Range: 0 - 6 % 2 1  Lymphs, CSF Latest Ref Range: 40 - 80 % 91 (H) 92 (H)  Monocyte-Macrophage-Spinal Fluid Latest Ref Range: 15 - 45 % 5 (L) 6 (L)  Eosinophils, CSF Latest Ref Range: 0 - 1 % 2 (H) 1  Color, CSF Latest Ref Range: COLORLESS  COLORLESS COLORLESS  Supernatant Unknown NOT INDICATED NOT INDICATED  Total  Protein, CSF Latest Ref Range: 15 - 45 mg/dL 53 (H)   Tube # Unknown 1 and 4 4   CSF cryptococcal antigen was negative    No results found.   Etta Quill PA-C [facility] [phone]   Assessment: 67 year old male presenting with mental status change, fever and headache.  Most likely at this time a viral or bacterial meningoencephalitis given LP results after partial treatment with IV antibiotics and acyclovir.   1. Infectious disease has been consulted. Further CSF testing, including [facility] spotted fever, enterovirus and HSV PCR are pending.   2. MRI pending  Recommendations: - Continue current antibiotic regimen pending PCR and cultures from CSF.  -- Infectious disease following at this point in time -- No further recommendations from neurology,  unless MRI brain shows a new lesion - Neurology will sign off. Please call if there are additional questions.   Electronically signed: Dr. Kerney Elbe 07/03/2018, 9:19 AM

## 2018-07-03 NOTE — Progress Notes (Signed)
Pt returned from MRI, alert X4. Pt denies pain. Will continue to monitor

## 2018-07-04 LAB — PROTIME-INR
INR: >10
INR: 1.18
PROTHROMBIN TIME: 14.9 s (ref 11.4–15.2)
Prothrombin Time: >90 seconds — ABNORMAL HIGH (ref 11.4–15.2)

## 2018-07-04 LAB — CBC
HCT: 45 % (ref 39.0–52.0)
Hemoglobin: 15.3 g/dL (ref 13.0–17.0)
MCH: 31.8 pg (ref 26.0–34.0)
MCHC: 34 g/dL (ref 30.0–36.0)
MCV: 93.6 fL (ref 78.0–100.0)
PLATELETS: 150 10*3/uL (ref 150–400)
RBC: 4.81 MIL/uL (ref 4.22–5.81)
RDW: 12 % (ref 11.5–15.5)
WBC: 9.3 10*3/uL (ref 4.0–10.5)

## 2018-07-04 LAB — HERPES SIMPLEX VIRUS(HSV) DNA BY PCR
HSV 1 DNA: NEGATIVE
HSV 2 DNA: NEGATIVE

## 2018-07-04 MED ORDER — BISACODYL 10 MG RE SUPP: 10.0000 mg | Freq: Every day | RECTAL | Status: DC | PRN

## 2018-07-04 MED ORDER — LEVETIRACETAM 500 MG PO TABS
500.0000 mg | ORAL_TABLET | Freq: Two times a day (BID) | ORAL | Status: DC
  Administered 2018-07-04 – 2018-07-05 (×2): 500 mg via ORAL
  Filled 2018-07-04: qty 2
  Filled 2018-07-04: qty 1

## 2018-07-04 MED ORDER — WARFARIN SODIUM 7.5 MG PO TABS
7.5000 mg | ORAL_TABLET | Freq: Once | ORAL | Status: AC
  Administered 2018-07-04: 7.5 mg via ORAL
  Filled 2018-07-04: qty 1

## 2018-07-04 MED ORDER — BISACODYL 5 MG PO TBEC: 5.0000 mg | DELAYED_RELEASE_TABLET | Freq: Every day | ORAL | Status: DC | PRN

## 2018-07-04 MED ORDER — POLYETHYLENE GLYCOL 3350 17 G PO PACK
17.0000 g | PACK | Freq: Every day | ORAL | Status: DC
  Administered 2018-07-04 – 2018-07-05 (×2): 17 g via ORAL
  Filled 2018-07-04 (×2): qty 1

## 2018-07-04 NOTE — Progress Notes (Addendum)
Received critical lab value of INR >10; notified pharmacy and oncall doctor.  Pharmacy questioning value asking for redraw; notified dr.;  Please contact dr with new lab value once new INR draw is done.  Will pass on during shift change to day RN.

## 2018-07-04 NOTE — Progress Notes (Signed)
PROGRESS NOTE    Alex Maddox  YWV:[id number] DOB: 1950/12/12 DOA: 06/28/2018 PCP: Redmond School, MD   Brief Narrative:  Alex Maddox is Alex Maddox 67 y.o. male with Harles Evetts history of PAF on coumadin, recurrent neurally mediated syncope s/p PPM, and TIA who was admitted 9/20 with AMS, fever, headache and concern for CNS infection vs. CVA. Initial CT suggested stroke, though this was not seen on follow up CTA head and neck. MRI initially thought to be contraindicated due to pacemaker, though this is MRI conditional. Neurology was consulted and the patient transferred [facility] to [facility]. LP was recommended though INR was therapeutic, and LP has been delayed until INR below 1.5, performed 9/23. Empiric vancomycin, ceftriaxone were started for meningitis, acyclovir later added for possible encephalitis. Due to his frequent outdoor exposures, tickborne illness was consider for which doxycycline was added. There is no leukocytosis and patient has been afebrile since the day of admission when oral temperature was 103.50F. Mental status is returning to baseline, though the patient feels weak, and headache has resolved. ID is consulted for further recommendations.    Assessment & Plan:   Principal Problem:   Acute Toxic metabolic encephalopathy due to infection Active Problems:   Atrial fibrillation ([place])   Long term current use of anticoagulant therapy   Pacemaker - Medtronic REVO dual-chamber implanted August 2012   HTN (hypertension)   Thrombocytopenia ([facility])   Sepsis ([place])   Fever in adult >> 103   Suspected CNS infection   Acute toxic metabolic encephalopathy with concern for meningitis/encephalitis: Mentation improving. Neuroimaging negative for definitive CVA. Ehrlichiosis, Borrelia, RMSF serologies negative.  Suspect viral meningitis vs partially treated bacterial meningitis. - ID consulted, discontinued vancomycin, doxycycline with negative RMSF titer. Continue on ceftriaxone and acyclovir pending final  work up.  - Follow up CSF studies (negative cryptococcal antigen, negative HSV, pending enterovirus, pending arbovirus, pending RMSF). - CSF cx NGTD - ID following, appreciate recs - at this time, planning to observe additional 24 hours prior to home with PO abx  New Nonacute R parietal Encephalomalacia with hemosiderin staining (old TBI vs venous infarct) - f/u outpatient  Paroxysmal atrial fibrillation: With TIA, HTN, age, CHA2DS2-VASc score is 4.  - Resume warfarin, will d/c lovenox bridge  - Continue coreg, telemetry currently showing NSR.  HTN: BPs elevated - Norvasc increased 2.5mg  > 5mg , improved control. - Continue coreg 12.5mg  BID, irbesartan 300mg   Hyperlipidemia:  - Continue statin  BPH:  - Continue finasteride   DVT prophylaxis:  Code Status: full  Family Communication: wife at bedside Disposition Plan: possibly within 24 hours   Consultants:   Neurology  ID  Procedures:   LP 9/23  Antimicrobials:  Anti-infectives (From admission, onward)   Start     Dose/Rate Route Frequency Ordered Stop   07/02/18 2200  acyclovir (ZOVIRAX) 870 mg in dextrose 5 % 150 mL IVPB  Status:  Discontinued     870 mg 167.4 mL/hr over 60 Minutes Intravenous Every 8 hours 07/02/18 1328 07/04/18 0857   07/01/18 2200  vancomycin (VANCOCIN) IVPB 1000 mg/200 mL premix  Status:  Discontinued     1,000 mg 200 mL/hr over 60 Minutes Intravenous Every 12 hours 07/01/18 1202 07/01/18 1216   07/01/18 1230  vancomycin (VANCOCIN) IVPB 1000 mg/200 mL premix  Status:  Discontinued     1,000 mg 200 mL/hr over 60 Minutes Intravenous Every 12 hours 07/01/18 1216 07/03/18 1006   06/29/18 1600  doxycycline (VIBRAMYCIN) 100 mg in sodium chloride  0.9 % 250 mL IVPB  Status:  Discontinued     100 mg 125 mL/hr over 120 Minutes Intravenous Every 12 hours 06/29/18 1409 07/03/18 0959   06/29/18 1500  acyclovir (ZOVIRAX) 870 mg in dextrose 5 % 150 mL IVPB  Status:  Discontinued     870 mg 167.4 mL/hr  over 60 Minutes Intravenous Every 8 hours 06/29/18 1401 07/02/18 1328   06/29/18 1430  cefTRIAXone (ROCEPHIN) 2 g in sodium chloride 0.9 % 100 mL IVPB  Status:  Discontinued     2 g 200 mL/hr over 30 Minutes Intravenous Every 12 hours 06/29/18 1416 06/29/18 1416   06/29/18 1400  cefTRIAXone (ROCEPHIN) 2 g in sodium chloride 0.9 % 100 mL IVPB     2 g 200 mL/hr over 30 Minutes Intravenous Every 12 hours 06/29/18 1355     06/29/18 1100  ceFEPIme (MAXIPIME) 2 g in sodium chloride 0.9 % 100 mL IVPB  Status:  Discontinued     2 g 200 mL/hr over 30 Minutes Intravenous Every 12 hours 06/29/18 0908 06/29/18 1354   06/29/18 1000  vancomycin (VANCOCIN) IVPB 750 mg/150 ml premix  Status:  Discontinued     750 mg 150 mL/hr over 60 Minutes Intravenous Every 12 hours 06/29/18 0908 07/01/18 1202   06/28/18 2230  vancomycin (VANCOCIN) 1,750 mg in sodium chloride 0.9 % 500 mL IVPB     1,750 mg 250 mL/hr over 120 Minutes Intravenous  Once 06/28/18 2217 06/29/18 0157   06/28/18 2230  ceFEPIme (MAXIPIME) 2 g in sodium chloride 0.9 % 100 mL IVPB     2 g 200 mL/hr over 30 Minutes Intravenous  Once 06/28/18 2217 06/28/18 2319    \    Subjective: Feels tired, but better than on presentation.  Objective: Vitals:   07/04/18 0338 07/04/18 0733 07/04/18 1230 07/04/18 1529  BP: 131/87 131/87 121/78 (!) 137/95  Pulse: 66 77 72 69  Resp: 20 16 16 16   Temp: 98.3 F (36.8 C) 98.4 F (36.9 C) 97.8 F (36.6 C) 97.8 F (36.6 C)  TempSrc: Oral Oral Oral Oral  SpO2: 98% 98% 98% 99%  Weight:      Height:        Intake/Output Summary (Last 24 hours) at 07/04/2018 1945 Last data filed at 07/04/2018 1500 Gross per 24 hour  Intake 2062.79 ml  Output -  Net 2062.79 ml   Filed Weights   06/28/18 2124 06/29/18 0500  Weight: 90.7 kg 87.5 kg    Examination:  General exam: Appears calm and comfortable.  Appears fatigued. Respiratory system: Clear to auscultation. Respiratory effort normal. Cardiovascular  system: S1 & S2 heard, RRR. Gastrointestinal system: Abdomen is nondistended, soft and nontender. Central nervous system: Alert and oriented. No focal neurological deficits. Extremities: Symmetric 5 x 5 power. Skin: No rashes, lesions or ulcers Psychiatry: Judgement and insight appear normal. Mood & affect appropriate.     Data Reviewed: I have personally reviewed following labs and imaging studies  CBC: Recent Labs  Lab 06/28/18 2148 06/29/18 0556 07/01/18 0408 07/02/18 0559 07/04/18 0440  WBC 8.8 9.8 7.8 7.3 9.3  NEUTROABS 7.2  --   --   --   --   HGB 15.9 15.2 14.5 15.3 15.3  HCT 45.5 44.2 42.5 44.3 45.0  MCV 93.8 94.0 93.0 92.1 93.6  PLT 123* 122* 127* 145* 254   Basic Metabolic Panel: Recent Labs  Lab 06/28/18 2148 06/29/18 0556 07/01/18 0408 07/02/18 0559  NA 132* 135 137 140  K 3.6 3.5 3.2* 3.4*  CL 98 103 103 110  CO2 26 25 22 23   GLUCOSE 167* 117* 81 92  BUN 18 19 17 11   CREATININE 1.17 1.05 1.01 0.94  CALCIUM 9.6 9.3 8.6* 8.5*   GFR: Estimated Creatinine Clearance: 84.8 mL/min (by C-G formula based on SCr of 0.94 mg/dL). Liver Function Tests: Recent Labs  Lab 06/28/18 2148 06/29/18 0556  AST 29 25  ALT 39 36  ALKPHOS 53 48  BILITOT 1.4* 1.6*  PROT 7.5 7.0  ALBUMIN 4.1 3.8   No results for input(s): LIPASE, AMYLASE in the last 168 hours. No results for input(s): AMMONIA in the last 168 hours. Coagulation Profile: Recent Labs  Lab 07/01/18 0408 07/01/18 1628 07/02/18 0757 07/04/18 0440 07/04/18 0712  INR 1.74 1.58 1.34 >10.00* 1.18   Cardiac Enzymes: No results for input(s): CKTOTAL, CKMB, CKMBINDEX, TROPONINI in the last 168 hours. BNP (last 3 results) No results for input(s): PROBNP in the last 8760 hours. HbA1C: No results for input(s): HGBA1C in the last 72 hours. CBG: Recent Labs  Lab 06/28/18 2122 06/30/18 0431  GLUCAP 147* 111*   Lipid Profile: No results for input(s): CHOL, HDL, LDLCALC, TRIG, CHOLHDL, LDLDIRECT in the  last 72 hours. Thyroid Function Tests: No results for input(s): TSH, T4TOTAL, FREET4, T3FREE, THYROIDAB in the last 72 hours. Anemia Panel: No results for input(s): VITAMINB12, FOLATE, FERRITIN, TIBC, IRON, RETICCTPCT in the last 72 hours. Sepsis Labs: Recent Labs  Lab 06/28/18 2202 06/29/18 0051  LATICACIDVEN 2.19* 1.44    Recent Results (from the past 240 hour(s))  Blood Culture (routine x 2)     Status: None   Collection Time: 06/28/18  9:48 PM  Result Value Ref Range Status   Specimen Description BLOOD LEFT HAND  Final   Special Requests   Final    BOTTLES DRAWN AEROBIC AND ANAEROBIC Blood Culture adequate volume   Culture   Final    NO GROWTH 5 DAYS Performed at [facility], [address] [place], [place] [address]    Report Status 07/03/2018 FINAL  Final  Blood Culture (routine x 2)     Status: None   Collection Time: 06/28/18  9:53 PM  Result Value Ref Range Status   Specimen Description BLOOD RIGHT FOREARM  Final   Special Requests   Final    Blood Culture adequate volume BOTTLES DRAWN AEROBIC AND ANAEROBIC   Culture   Final    NO GROWTH 5 DAYS Performed at [facility], [address], [place], [place] [address]    Report Status 07/03/2018 FINAL  Final  MRSA PCR Screening     Status: None   Collection Time: 06/29/18  3:58 AM  Result Value Ref Range Status   MRSA by PCR NEGATIVE NEGATIVE Final    Comment:        The GeneXpert MRSA Assay (FDA approved for NASAL specimens only), is one component of Lynnita Somma comprehensive MRSA colonization surveillance program. It is not intended to diagnose MRSA infection nor to guide or monitor treatment for MRSA infections. Performed at [facility], [address] [place], [place] [address]   CSF culture with Stat gram stain     Status: None (Preliminary result)   Collection Time: 07/02/18 11:54 AM  Result Value Ref Range Status   Specimen Description CSF  Final   Special Requests NONE  Final   Gram Stain   Final    WBC  PRESENT,BOTH PMN AND MONONUCLEAR NO ORGANISMS SEEN CYTOSPIN  SMEAR    Culture   Final    NO GROWTH 2 DAYS Performed at [facility], [place] [address], [place], [place] [address]    Report Status PENDING  Incomplete         Radiology Studies: Mr Jeri Cos FV Contrast  Result Date: 07/03/2018 CLINICAL DATA:  Altered mental status, fever and headache. Evaluate for CNS infection or stroke. EXAM: MRI HEAD WITHOUT AND WITH CONTRAST TECHNIQUE: Multiplanar, multiecho pulse sequences of the brain and surrounding structures were obtained without and with intravenous contrast. CONTRAST:  9 cc Gadavist COMPARISON:  CT HEAD June 28, 2018 and MRA head September 27, 2010. FINDINGS: INTRACRANIAL CONTENTS: No reduced diffusion to suggest acute ischemia or infection. New RIGHT parietal hemosiderin staining associated with small area encephalomalacia. Moderate to severe parenchymal brain volume loss, progressed from 2011. Patchy to confluent supratentorial white matter FLAIR T2 hyperintensities. Prominent basal ganglia perivascular spaces associated with chronic small vessel ischemic changes. Old LEFT basal ganglia infarct with ex vacuo dilatation LEFT lateral ventricle. Old small RIGHT greater than LEFT cerebellar infarcts. VASCULAR: Normal major intracranial vascular flow voids present at skull base. SKULL AND UPPER CERVICAL SPINE: No abnormal sellar expansion. No suspicious calvarial bone marrow signal. Craniocervical junction maintained. SINUSES/ORBITS: Paranasal sinuses are well aerated. Minimal LEFT mastoid effusion. Included ocular globes and orbital contents are non-suspicious. OTHER: None. IMPRESSION: 1. No acute intracranial process. 2. New nonacute RIGHT parietal encephalomalacia with hemosiderin staining, possible old TBI or venous infarct. 3. Moderate to severe parenchymal brain volume loss, advanced for age. 4. 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 Moderate chronic small  vessel ischemic changes. Electronically Signed   By: Elon Alas M.D.   On: 07/03/2018 17:00        Scheduled Meds: . amLODipine  5 mg Oral Daily  . carvedilol  12.5 mg Oral BID WC  . enoxaparin (LOVENOX) injection  90 mg Subcutaneous Q12H  . finasteride  5 mg Oral Daily  . irbesartan  300 mg Oral Daily  . levETIRAcetam  500 mg Oral BID  . polyethylene glycol  17 g Oral Daily  . triamcinolone cream   Topical TID  . Warfarin - Pharmacist Dosing Inpatient   Does not apply q1800   Continuous Infusions: . sodium chloride 75 mL/hr at 07/03/18 2319  . cefTRIAXone (ROCEPHIN)  IV 2 g (07/04/18 1337)     LOS: 5 days    Time spent: over 30 min    Fayrene Helper, MD [facility] Pager [phone]  If 7PM-7AM, please contact night-coverage [email or web address] Password [facility] 07/04/2018, 7:45 PM

## 2018-07-04 NOTE — Progress Notes (Signed)
INFECTIOUS DISEASE PROGRESS NOTE  ID: Alex Maddox is a 67 y.o. male with  Principal Problem:   Acute Toxic metabolic encephalopathy due to infection Active Problems:   Atrial fibrillation [facility])   Long term current use of anticoagulant therapy   Pacemaker - Medtronic REVO dual-chamber implanted August 2012   HTN (hypertension)   Thrombocytopenia (HCC)   Sepsis ([place])   Fever in adult >> 103   Suspected CNS infection  Subjective: No complaints.   Abtx:  Anti-infectives (From admission, onward)   Start     Dose/Rate Route Frequency Ordered Stop   07/02/18 2200  acyclovir (ZOVIRAX) 870 mg in dextrose 5 % 150 mL IVPB     870 mg 167.4 mL/hr over 60 Minutes Intravenous Every 8 hours 07/02/18 1328     07/01/18 2200  vancomycin (VANCOCIN) IVPB 1000 mg/200 mL premix  Status:  Discontinued     1,000 mg 200 mL/hr over 60 Minutes Intravenous Every 12 hours 07/01/18 1202 07/01/18 1216   07/01/18 1230  vancomycin (VANCOCIN) IVPB 1000 mg/200 mL premix  Status:  Discontinued     1,000 mg 200 mL/hr over 60 Minutes Intravenous Every 12 hours 07/01/18 1216 07/03/18 1006   06/29/18 1600  doxycycline (VIBRAMYCIN) 100 mg in sodium chloride 0.9 % 250 mL IVPB  Status:  Discontinued     100 mg 125 mL/hr over 120 Minutes Intravenous Every 12 hours 06/29/18 1409 07/03/18 0959   06/29/18 1500  acyclovir (ZOVIRAX) 870 mg in dextrose 5 % 150 mL IVPB  Status:  Discontinued     870 mg 167.4 mL/hr over 60 Minutes Intravenous Every 8 hours 06/29/18 1401 07/02/18 1328   06/29/18 1430  cefTRIAXone (ROCEPHIN) 2 g in sodium chloride 0.9 % 100 mL IVPB  Status:  Discontinued     2 g 200 mL/hr over 30 Minutes Intravenous Every 12 hours 06/29/18 1416 06/29/18 1416   06/29/18 1400  cefTRIAXone (ROCEPHIN) 2 g in sodium chloride 0.9 % 100 mL IVPB     2 g 200 mL/hr over 30 Minutes Intravenous Every 12 hours 06/29/18 1355     06/29/18 1100  ceFEPIme (MAXIPIME) 2 g in sodium chloride 0.9 % 100 mL IVPB  Status:   Discontinued     2 g 200 mL/hr over 30 Minutes Intravenous Every 12 hours 06/29/18 0908 06/29/18 1354   06/29/18 1000  vancomycin (VANCOCIN) IVPB 750 mg/150 ml premix  Status:  Discontinued     750 mg 150 mL/hr over 60 Minutes Intravenous Every 12 hours 06/29/18 0908 07/01/18 1202   06/28/18 2230  vancomycin (VANCOCIN) 1,750 mg in sodium chloride 0.9 % 500 mL IVPB     1,750 mg 250 mL/hr over 120 Minutes Intravenous  Once 06/28/18 2217 06/29/18 0157   06/28/18 2230  ceFEPIme (MAXIPIME) 2 g in sodium chloride 0.9 % 100 mL IVPB     2 g 200 mL/hr over 30 Minutes Intravenous  Once 06/28/18 2217 06/28/18 2319      Medications:  Scheduled: . amLODipine  5 mg Oral Daily  . carvedilol  12.5 mg Oral BID WC  . enoxaparin (LOVENOX) injection  90 mg Subcutaneous Q12H  . finasteride  5 mg Oral Daily  . irbesartan  300 mg Oral Daily  . triamcinolone cream   Topical TID  . Warfarin - Pharmacist Dosing Inpatient   Does not apply q1800    Objective: Vital signs in last 24 hours: Temp:  [98 F (36.7 C)-98.4 F (36.9 C)] 98.4 F (  36.9 C) (09/25 0733) Pulse Rate:  [66-77] 77 (09/25 0733) Resp:  [16-20] 16 (09/25 0733) BP: (119-141)/(80-87) 131/87 (09/25 0733) SpO2:  [95 %-99 %] 98 % (09/25 0733)   General appearance: alert, cooperative and no distress Neck: FROM Resp: clear to auscultation bilaterally Cardio: regular rate and rhythm GI: normal findings: bowel sounds normal and soft, non-tender  Lab Results Recent Labs    07/02/18 0559 07/04/18 0440  WBC 7.3 9.3  HGB 15.3 15.3  HCT 44.3 45.0  NA 140  --   K 3.4*  --   CL 110  --   CO2 23  --   BUN 11  --   CREATININE 0.94  --    Liver Panel No results for input(s): PROT, ALBUMIN, AST, ALT, ALKPHOS, BILITOT, BILIDIR, IBILI in the last 72 hours. Sedimentation Rate No results for input(s): ESRSEDRATE in the last 72 hours. C-Reactive Protein No results for input(s): CRP in the last 72 hours.  Microbiology: Recent Results  (from the past 240 hour(s))  Blood Culture (routine x 2)     Status: None   Collection Time: 06/28/18  9:48 PM  Result Value Ref Range Status   Specimen Description BLOOD LEFT HAND  Final   Special Requests   Final    BOTTLES DRAWN AEROBIC AND ANAEROBIC Blood Culture adequate volume   Culture   Final    NO GROWTH 5 DAYS Performed at [facility], [address], [place], [place] [address]    Report Status 07/03/2018 FINAL  Final  Blood Culture (routine x 2)     Status: None   Collection Time: 06/28/18  9:53 PM  Result Value Ref Range Status   Specimen Description BLOOD RIGHT FOREARM  Final   Special Requests   Final    Blood Culture adequate volume BOTTLES DRAWN AEROBIC AND ANAEROBIC   Culture   Final    NO GROWTH 5 DAYS Performed at [facility], [address] [place], [place] [address]    Report Status 07/03/2018 FINAL  Final  MRSA PCR Screening     Status: None   Collection Time: 06/29/18  3:58 AM  Result Value Ref Range Status   MRSA by PCR NEGATIVE NEGATIVE Final    Comment:        The GeneXpert MRSA Assay (FDA approved for NASAL specimens only), is one component of a comprehensive MRSA colonization surveillance program. It is not intended to diagnose MRSA infection nor to guide or monitor treatment for MRSA infections. Performed at [facility], [address], [place], [place] [address]   CSF culture with Stat gram stain     Status: None (Preliminary result)   Collection Time: 07/02/18 11:54 AM  Result Value Ref Range Status   Specimen Description CSF  Final   Special Requests NONE  Final   Gram Stain   Final    WBC PRESENT,BOTH PMN AND MONONUCLEAR NO ORGANISMS SEEN CYTOSPIN SMEAR    Culture   Final    NO GROWTH 2 DAYS Performed at [facility], [place] [address], [place], [place] [address]    Report Status PENDING  Incomplete    Studies/Results: Mr Jeri Cos Wo Contrast  Result Date: 07/03/2018 CLINICAL DATA:  Altered mental status, fever and  headache. Evaluate for CNS infection or stroke. EXAM: MRI HEAD WITHOUT AND WITH CONTRAST TECHNIQUE: Multiplanar, multiecho pulse sequences of the brain and surrounding structures were obtained without and with intravenous contrast. CONTRAST:  9 cc Gadavist COMPARISON:  CT HEAD June 28, 2018 and MRA head September 27, 2010. FINDINGS: INTRACRANIAL CONTENTS: No reduced diffusion to suggest acute ischemia or infection. New RIGHT parietal hemosiderin staining associated with small area encephalomalacia. Moderate to severe parenchymal brain volume loss, progressed from 2011. Patchy to confluent supratentorial white matter FLAIR T2 hyperintensities. Prominent basal ganglia perivascular spaces associated with chronic small vessel ischemic changes. Old LEFT basal ganglia infarct with ex vacuo dilatation LEFT lateral ventricle. Old small RIGHT greater than LEFT cerebellar infarcts. VASCULAR: Normal major intracranial vascular flow voids present at skull base. SKULL AND UPPER CERVICAL SPINE: No abnormal sellar expansion. No suspicious calvarial bone marrow signal. Craniocervical junction maintained. SINUSES/ORBITS: Paranasal sinuses are well aerated. Minimal LEFT mastoid effusion. Included ocular globes and orbital contents are non-suspicious. OTHER: None. IMPRESSION: 1. No acute intracranial process. 2. New nonacute RIGHT parietal encephalomalacia with hemosiderin staining, possible old TBI or venous infarct. 3. Moderate to severe parenchymal brain volume loss, advanced for age. 4. 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 Moderate chronic small vessel ischemic changes. Electronically Signed   By: Elon Alas M.D.   On: 07/03/2018 17:00     Assessment/Plan: Meningitis CVA  Total days of antibiotics: 5 ceftriaxone/Acyclovir  Await his multiple studies and Cx HSV PCR (-), will stop ACV hopefully home soon (no need to wait for viral studies) Suggested to pt and wife that he can wait  here for 24h (for Cx to incubate further) or go home on po (levaquin 500mg  daily for 7 days). They are considering.  His lymphocyte predominance raises several possibilities- 1) partially treated meningitis, 2) viral meningitis, 3) TB , 4) Cancer, 5) listeria meningitis (he has improved without amp) Explained to pt/family         Bobby Rumpf MD, FACP Infectious Diseases (pager) [phone] [email or web address] 07/04/2018, 8:55 AM  LOS: 5 days

## 2018-07-04 NOTE — Progress Notes (Signed)
Alex Maddox for Enoxaparin and warfarin Indication: Atrial fibrillation  No Known Allergies  Patient Measurements: Height: 6' (182.9 cm) Weight: 192 lb 14.4 oz (87.5 kg) IBW/kg (Calculated) : 77.6   Vital Signs: Temp: 98.4 F (36.9 C) (09/25 0733) Temp Source: Oral (09/25 0733) BP: 131/87 (09/25 0733) Pulse Rate: 77 (09/25 0733)  Labs: Recent Labs    07/02/18 0559 07/02/18 0757 07/04/18 0440 07/04/18 0712  HGB 15.3  --  15.3  --   HCT 44.3  --  45.0  --   PLT 145*  --  150  --   LABPROT  --  16.5* >90.0* 14.9  INR  --  1.34 >10.00* 1.18  CREATININE 0.94  --   --   --     Estimated Creatinine Clearance: 84.8 mL/min (by C-G formula based on SCr of 0.94 mg/dL).   Assessment: 67 yo male with h/o PAF on warfarin admitted with AMS and fever with concern for CNS infection vs CVA. Patient's warfarin held for LP. Warfarin to be restarted with enoxaparin bridge. Marland Kitchen  PTA warfarin dose is 5mg  daily. I   Goal of Therapy:  INR 2-3 Monitor platelets by anticoagulation protocol: Yes   Plan:  Enoxaparin 90mg  SQ q12h Warfarin 7.5mg  x 1 Daily INR Monitor for s/sx of bleeding  Thank you Anette Guarneri, PharmD [phone] Please utilize Amion for appropriate phone number to reach the unit pharmacist ([place])   07/04/2018,10:43 AM

## 2018-07-05 LAB — MAGNESIUM: Magnesium: 2.1 mg/dL (ref 1.7–2.4)

## 2018-07-05 LAB — CSF CULTURE W GRAM STAIN: Culture: NO GROWTH

## 2018-07-05 LAB — ROCKY MTN SPOTTED FVR ABS PNL(IGG+IGM)
RMSF IGM: 0.64 {index} (ref 0.00–0.89)
RMSF IgG: NEGATIVE

## 2018-07-05 LAB — BASIC METABOLIC PANEL
Anion gap: 7 (ref 5–15)
BUN: 8 mg/dL (ref 8–23)
CHLORIDE: 108 mmol/L (ref 98–111)
CO2: 26 mmol/L (ref 22–32)
Calcium: 9.2 mg/dL (ref 8.9–10.3)
Creatinine, Ser: 0.83 mg/dL (ref 0.61–1.24)
GFR calc Af Amer: >60 mL/min (ref >60)
GFR calc non Af Amer: >60 mL/min (ref >60)
Glucose, Bld: 88 mg/dL (ref 70–99)
POTASSIUM: 3.3 mmol/L — AB (ref 3.5–5.1)
Sodium: 141 mmol/L (ref 135–145)

## 2018-07-05 LAB — PROTIME-INR
INR: 1.34
Prothrombin Time: 16.5 seconds — ABNORMAL HIGH (ref 11.4–15.2)

## 2018-07-05 LAB — CSF CULTURE

## 2018-07-05 MED ORDER — LEVOFLOXACIN 500 MG PO TABS: 500.0000 mg | ORAL_TABLET | Freq: Every day | ORAL | 0 refills | Status: AC

## 2018-07-05 MED ORDER — AMLODIPINE BESYLATE 5 MG PO TABS: 5.0000 mg | ORAL_TABLET | Freq: Every day | ORAL | 0 refills | Status: DC

## 2018-07-05 MED ORDER — WARFARIN SODIUM 7.5 MG PO TABS: 7.5000 mg | ORAL_TABLET | Freq: Once | ORAL | Status: DC

## 2018-07-05 MED ORDER — PRAVASTATIN SODIUM 40 MG PO TABS: 40.0000 mg | ORAL_TABLET | Freq: Every evening | ORAL | 0 refills | Status: DC

## 2018-07-05 MED ORDER — POTASSIUM CHLORIDE CRYS ER 20 MEQ PO TBCR: 40.0000 meq | EXTENDED_RELEASE_TABLET | Freq: Once | ORAL | Status: DC

## 2018-07-05 NOTE — Progress Notes (Signed)
[place] for Warfarin Indication: Atrial fibrillation  No Known Allergies  Patient Measurements: Height: 6' (182.9 cm) Weight: 192 lb 14.4 oz (87.5 kg) IBW/kg (Calculated) : 77.6   Vital Signs: Temp: 98.2 F (36.8 C) (09/26 1000) Temp Source: Oral (09/26 1000) BP: 108/76 (09/26 1000) Pulse Rate: 60 (09/26 0758)  Labs: Recent Labs    07/04/18 0440 07/04/18 0712 07/05/18 0533  HGB 15.3  --   --   HCT 45.0  --   --   PLT 150  --   --   LABPROT >90.0* 14.9 16.5*  INR >10.00* 1.18 1.34  CREATININE  --   --  0.83    Estimated Creatinine Clearance: 96.1 mL/min (by C-G formula based on SCr of 0.83 mg/dL).   Assessment: 67 yo male with h/o PAF on warfarin admitted with AMS and fever with concern for CNS infection vs CVA. Patient's warfarin held for LP. Warfarin to be restarted with enoxaparin bridge. Marland Kitchen  PTA warfarin dose is 5mg  daily. I   Goal of Therapy:  INR 2-3 Monitor platelets by anticoagulation protocol: Yes   Plan:  Repeat Warfarin 7.5mg  x 1 Daily INR Monitor for s/sx of bleeding  Thank you Anette Guarneri, PharmD [phone] Please utilize Amion for appropriate phone number to reach the unit pharmacist ([place])   07/05/2018,10:42 AM

## 2018-07-05 NOTE — Discharge Summary (Signed)
Physician Discharge Summary  Alex Maddox YIF:[id number] DOB: 1950-10-22 DOA: 06/28/2018  PCP: Alex School, MD  Admit date: 06/28/2018 Discharge date: 07/05/2018  Time spent: 40 minutes  Recommendations for Outpatient Follow-up:  1. Follow up outpatient CBC/CMP 2. Ensure completion of abx regimen and follow up with PCP as outpatient 3. Follow final CSF fluid studies (arbovirus and enterovirus pending).  Follow cultures. 4. Follow BP, amlodipine increased to 5 mg daily 5. As pt on amlodipine, simvastatin switched to pravastatin 6. Follow up with coumadin clinic, appointment scheduled for Monday 7. Pt with new nonacute R parietal encephalomalacia with hemosiderin staining, f/u with PCP  Discharge Diagnoses:  Principal Problem:   Acute Toxic metabolic encephalopathy due to infection Active Problems:   Atrial fibrillation ([place])   Long term current use of anticoagulant therapy   Pacemaker - Medtronic REVO dual-chamber implanted August 2012   HTN (hypertension)   Thrombocytopenia (H[facility])   Sepsis ([place])   Fever in adult >> 103   Suspected CNS infection   Discharge Condition: stable  Diet recommendation: heart healthy  Filed Weights   06/28/18 2124 06/29/18 0500  Weight: 90.7 kg 87.5 kg    History of present illness:  Alex Maddox a67 y.o.malewith Alex Maddox history of PAF on coumadin, recurrent neurally mediated syncope s/p PPM, and TIA who was admitted 9/20 with AMS, fever, headache and concern for CNS infection vs. CVA. Initial CT suggested stroke, though this was not seen on follow up CTA head and neck. MRI initially thought to be contraindicated due to pacemaker, though this is MRI conditional. Neurology was consulted and the patient transferred from [facility] to [facility]. LP was recommended though INR was therapeutic, and LP has been delayed until INR below 1.5, performed 9/23. Empiric vancomycin, ceftriaxone were started for meningitis, acyclovir later added for possible encephalitis. Due  to his frequent outdoor exposures, tickborne illness was consider for which doxycycline was added. There is no leukocytosis and patient has been afebrile since the day of admission when oral temperature was 103.78F. Mental status is returning to baseline, though the patient feels weak, and headache has resolved. ID is consulted for further recommendations.   He was admitted with AMS, fever, headache with concern for CNS infection vs CVA.  He had LP on 9/23 with results suggesting viral or bacterial meningoencephalitis after partial treatment with IV abx and acyclovir.  Initially was on broad spectrum abx with vanc/ceftriaxone, acyclovir, and doxycycline.  He was ultimately discharged on levaquin.  Cultures and CSF studies have been negative so far at the time of discharge.  Hospital Course:  Acute toxic metabolic encephalopathy with concern for meningitis/encephalitis: Mentation improving. Neuroimaging negative for definitive CVA. Ehrlichiosis, Borrelia, RMSF serologies negative.  Suspect viral meningitis vs partially treated bacterial meningitis. -ID consulted, appreciate recommendations.  Cx so far NGTD.  Plan to discharge with 7 days of levaquin.  All other abx and antivirals have been d/c'd at this point.  - Of note, per ID, lymphocyte predominance raises concern for partially treated meningitis vs viral meningitis (vs TB, cancer, or listeria menigitis), but with improvement with abx he's received here, suspect most likely partially treated bacterial meningits vs viral meningits.  F/u outpatient as indicated. - Follow up CSF studies (negative cryptococcal antigen, negative HSV, pending enterovirus, pending arbovirus, negative RMSF). - CSF cx NGTD  New Nonacute R parietal Encephalomalacia with hemosiderin staining (old TBI vs venous infarct) - f/u outpatient - of note, pt does note hx of traumatic injury about 4 years ago  Paroxysmal atrial fibrillation: With TIA, HTN, age, CHA2DS2-VASc score is  4.  - Resume warfarin - needs f/u with coumadin clinic as outpatient, scheduled for monday - Continue coreg  HTN: BPs elevated - Norvasc increased 2.5mg  > 5mg , improved control. - Continue coreg 12.5mg  BID, irbesartan 300mg   Hyperlipidemia:  - Continue statin  BPH:  - Continue finasteride  Procedures:  LP 9/23  Consultations:  Neurology  ID  Discharge Exam: Vitals:   07/05/18 1140 07/05/18 1231  BP: 118/82 111/80  Pulse: 69 63  Resp: 17 16  Temp: 98.4 F (36.9 C) 98.1 F (36.7 C)  SpO2: 98% 99%   Feels tired, but better overall. Ready to go home.  Wife at bedside.  General: No acute distress. Cardiovascular: Heart sounds show Alex Maddox regular rate, and rhythm Lungs: Clear to auscultation bilaterally  Abdomen: Soft, nontender, nondistended  Neurological: Alert and oriented 3. Moves all extremities 4 . Cranial nerves II through XII grossly intact. Skin: Warm and dry. No rashes or lesions. Extremities: No clubbing or cyanosis. No edema.  Psychiatric: Mood and affect are normal. Insight and judgment are appropriate.  Discharge Instructions   Discharge Instructions    Call MD for:  difficulty breathing, headache or visual disturbances   Complete by:  As directed    Call MD for:  extreme fatigue   Complete by:  As directed    Call MD for:  persistant dizziness or light-headedness   Complete by:  As directed    Call MD for:  persistant nausea and vomiting   Complete by:  As directed    Call MD for:  redness, tenderness, or signs of infection (pain, swelling, redness, odor or green/yellow discharge around incision site)   Complete by:  As directed    Call MD for:  severe uncontrolled pain   Complete by:  As directed    Call MD for:  temperature >100.4   Complete by:  As directed    Diet - low sodium heart healthy   Complete by:  As directed    Discharge instructions   Complete by:  As directed    You were seen for likely meningitis.  Your cultures and  workup has so far been negative, but your presentation and CSF fluid is concerning for partially treated bacterial meningitis vs viral meningitis.    We will send you home with another 7 days of antibiotics.  Please follow up with your PCP to follow up your pending labs and studies.  I changed your simvastatin to pravastatin because you're on amlodipine and sometimes there can be interactions between these medications.  Your amlodipine was increased slightly.   Please follow up with the coumadin clinic to adjust your warfarin regimen.  Return for new, recurrent, or worsening symptoms.  Please ask your PCP to request records from this hospitalization so they know what was done and what the next steps will be.   Increase activity slowly   Complete by:  As directed      Allergies as of 07/05/2018   No Known Allergies     Medication List    STOP taking these medications   simvastatin 40 MG tablet Commonly known as:  ZOCOR     TAKE these medications   amLODipine 5 MG tablet Commonly known as:  NORVASC Take 1 tablet (5 mg total) by mouth daily. What changed:    medication strength  how much to take   carvedilol 12.5 MG tablet Commonly known as:  COREG TAKE  ONE TABLET (12.5MG  TOTAL) BY MOUTH TWO TIMES DAILY WITH Abigail Teall MEAL   diazepam 2 MG tablet Commonly known as:  VALIUM Take 2 mg by mouth every 6 (six) hours as needed for anxiety.   finasteride 5 MG tablet Commonly known as:  PROSCAR Take 5 mg by mouth daily.   fish oil-omega-3 fatty acids 1000 MG capsule Take 1 g by mouth daily.   irbesartan 300 MG tablet Commonly known as:  AVAPRO TAKE ONE (1) TABLET BY MOUTH EVERY DAY   levofloxacin 500 MG tablet Commonly known as:  LEVAQUIN Take 1 tablet (500 mg total) by mouth daily for 7 days.   multivitamin tablet Take 1 tablet by mouth daily.   pravastatin 40 MG tablet Commonly known as:  PRAVACHOL Take 1 tablet (40 mg total) by mouth every evening.   warfarin 5 MG  tablet Commonly known as:  COUMADIN Take as directed. If you are unsure how to take this medication, talk to your nurse or doctor. Original instructions:  Take one tablet by mouth daily as directed by coumadin clinic      No Known Allergies    The results of significant diagnostics from this hospitalization (including imaging, microbiology, ancillary and laboratory) are listed below for reference.    Significant Diagnostic Studies: Ct Angio Head W Or Wo Contrast  Result Date: 06/29/2018 CLINICAL DATA:  Headache, fever, altered mental status. EXAM: CT ANGIOGRAPHY HEAD AND NECK TECHNIQUE: Multidetector CT imaging of the head and neck was performed using the standard protocol during bolus administration of intravenous contrast. Multiplanar CT image reconstructions and MIPs were obtained to evaluate the vascular anatomy. Carotid stenosis measurements (when applicable) are obtained utilizing NASCET criteria, using the distal internal carotid diameter as the denominator. CONTRAST:  84mL ISOVUE-370 IOPAMIDOL (ISOVUE-370) INJECTION 76% COMPARISON:  CT head 06/28/2018. CT head 09/12/2017. FINDINGS: CTA NECK FINDINGS Aortic arch: Standard branching. Imaged portion shows no evidence of aneurysm or dissection. No significant stenosis of the major arch vessel origins. Right carotid system: No evidence of dissection, stenosis (50% or greater) or occlusion. Nonstenotic calcific plaque at the bifurcation. Left carotid system: No evidence of dissection, stenosis (50% or greater) or occlusion. Nonstenotic calcific plaque at the bifurcation. Vertebral arteries: LEFT vertebral dominant. Both are patent. No ostial narrowing or stenosis in the neck. Skeleton: No worrisome osseous lesion. C5-6 ACDF appears well-healed. Other neck: No neck masses. Upper chest: No pneumothorax or mass. Review of the MIP images confirms the above findings CTA HEAD FINDINGS Anterior circulation: No significant stenosis, proximal occlusion,  aneurysm, or vascular malformation. Nonstenotic calcific atheromatous change in the cavernous internal carotid arteries bilaterally. Posterior circulation: No significant stenosis, proximal occlusion, aneurysm, or vascular malformation. Minor atheromatous change distal LEFT vertebral. Venous sinuses: As permitted by contrast timing, patent. Anatomic variants: None of significance. Delayed phase: No abnormal intracranial enhancement. Review of the MIP images confirms the above findings IMPRESSION: No intracranial or extracranial flow-limiting stenosis, occlusion, or dissection. Calcific nonstenotic atrial and intracranial atherosclerotic disease as described. No abnormal intracranial enhancement. Asymmetric LEFT anterior internal capsule/basal ganglia hypodensity remains of indeterminate age, but likely represents chronic ischemia which has developed since December of 2018. Electronically Signed   By: Staci Righter M.D.   On: 06/29/2018 12:17   Dg Chest 2 View  Result Date: 06/28/2018 CLINICAL DATA:  Headache and altered mental status. Fever of 103. History of hypertension, atrial fibrillation, pacemaker. EXAM: CHEST - 2 VIEW COMPARISON:  09/12/2017 FINDINGS: Cardiac pacemaker. Shallow inspiration. Mild cardiac enlargement. No vascular  congestion, edema, or consolidation. No blunting of costophrenic angles. No pneumothorax. Mediastinal contours appear intact. Postoperative changes in the cervical spine. IMPRESSION: Mild cardiac enlargement. No evidence of active pulmonary disease. Electronically Signed   By: Lucienne Capers M.D.   On: 06/28/2018 22:33   Ct Head Wo Contrast  Result Date: 06/28/2018 CLINICAL DATA:  Headache and altered mental status. Fever of 103. normal neurological examination. EXAM: CT HEAD WITHOUT CONTRAST TECHNIQUE: Contiguous axial images were obtained from the base of the skull through the vertex without intravenous contrast. COMPARISON:  09/12/2017 FINDINGS: Brain: Diffuse cerebral  atrophy. Mild ventricular dilatation consistent with central atrophy. Low-attenuation changes in the deep white matter consistent small vessel ischemia. Since the previous study, there is interval development of asymmetric low-attenuation change in the left internal capsule region. This could indicate acute or subacute ischemia. Consider MRI for further evaluation if clinically suspicious. No mass-effect or midline shift. No abnormal extra-axial fluid collections. Gray-white matter junctions are distinct. Basal cisterns are not effaced. No acute intracranial hemorrhage. Vascular: Moderate intracranial arterial vascular calcifications are present. Skull: Focal thinning of the calvarium in the left temporal region, unchanged since previous study. Significance is uncertain. No expansile or destructive bone lesions are identified. No depressed skull fractures. Sinuses/Orbits: Paranasal sinuses and mastoid air cells are clear. Other: None. IMPRESSION: 1. Interval development of asymmetric low-attenuation change in the left internal capsule region. This could indicate acute or subacute ischemia. Consider MRI for further evaluation if clinically suspicious. 2. No acute intracranial hemorrhage or mass effect. 3. Chronic atrophy and small vessel ischemic changes. Electronically Signed   By: Lucienne Capers M.D.   On: 06/28/2018 22:39   Ct Angio Neck W Or Wo Contrast  Result Date: 06/29/2018 CLINICAL DATA:  Headache, fever, altered mental status. EXAM: CT ANGIOGRAPHY HEAD AND NECK TECHNIQUE: Multidetector CT imaging of the head and neck was performed using the standard protocol during bolus administration of intravenous contrast. Multiplanar CT image reconstructions and MIPs were obtained to evaluate the vascular anatomy. Carotid stenosis measurements (when applicable) are obtained utilizing NASCET criteria, using the distal internal carotid diameter as the denominator. CONTRAST:  5mL ISOVUE-370 IOPAMIDOL (ISOVUE-370)  INJECTION 76% COMPARISON:  CT head 06/28/2018. CT head 09/12/2017. FINDINGS: CTA NECK FINDINGS Aortic arch: Standard branching. Imaged portion shows no evidence of aneurysm or dissection. No significant stenosis of the major arch vessel origins. Right carotid system: No evidence of dissection, stenosis (50% or greater) or occlusion. Nonstenotic calcific plaque at the bifurcation. Left carotid system: No evidence of dissection, stenosis (50% or greater) or occlusion. Nonstenotic calcific plaque at the bifurcation. Vertebral arteries: LEFT vertebral dominant. Both are patent. No ostial narrowing or stenosis in the neck. Skeleton: No worrisome osseous lesion. C5-6 ACDF appears well-healed. Other neck: No neck masses. Upper chest: No pneumothorax or mass. Review of the MIP images confirms the above findings CTA HEAD FINDINGS Anterior circulation: No significant stenosis, proximal occlusion, aneurysm, or vascular malformation. Nonstenotic calcific atheromatous change in the cavernous internal carotid arteries bilaterally. Posterior circulation: No significant stenosis, proximal occlusion, aneurysm, or vascular malformation. Minor atheromatous change distal LEFT vertebral. Venous sinuses: As permitted by contrast timing, patent. Anatomic variants: None of significance. Delayed phase: No abnormal intracranial enhancement. Review of the MIP images confirms the above findings IMPRESSION: No intracranial or extracranial flow-limiting stenosis, occlusion, or dissection. Calcific nonstenotic atrial and intracranial atherosclerotic disease as described. No abnormal intracranial enhancement. Asymmetric LEFT anterior internal capsule/basal ganglia hypodensity remains of indeterminate age, but likely represents chronic ischemia which  has developed since December of 2018. Electronically Signed   By: Staci Righter M.D.   On: 06/29/2018 12:17   Mr Jeri Cos HE Contrast  Result Date: 07/03/2018 CLINICAL DATA:  Altered mental status,  fever and headache. Evaluate for CNS infection or stroke. EXAM: MRI HEAD WITHOUT AND WITH CONTRAST TECHNIQUE: Multiplanar, multiecho pulse sequences of the brain and surrounding structures were obtained without and with intravenous contrast. CONTRAST:  9 cc Gadavist COMPARISON:  CT HEAD June 28, 2018 and MRA head September 27, 2010. FINDINGS: INTRACRANIAL CONTENTS: No reduced diffusion to suggest acute ischemia or infection. New RIGHT parietal hemosiderin staining associated with small area encephalomalacia. Moderate to severe parenchymal brain volume loss, progressed from 2011. Patchy to confluent supratentorial white matter FLAIR T2 hyperintensities. Prominent basal ganglia perivascular spaces associated with chronic small vessel ischemic changes. Old LEFT basal ganglia infarct with ex vacuo dilatation LEFT lateral ventricle. Old small RIGHT greater than LEFT cerebellar infarcts. VASCULAR: Normal major intracranial vascular flow voids present at skull base. SKULL AND UPPER CERVICAL SPINE: No abnormal sellar expansion. No suspicious calvarial bone marrow signal. Craniocervical junction maintained. SINUSES/ORBITS: Paranasal sinuses are well aerated. Minimal LEFT mastoid effusion. Included ocular globes and orbital contents are non-suspicious. OTHER: None. IMPRESSION: 1. No acute intracranial process. 2. New nonacute RIGHT parietal encephalomalacia with hemosiderin staining, possible old TBI or venous infarct. 3. Moderate to severe parenchymal brain volume loss, advanced for age. 4. 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 Moderate chronic small vessel ischemic changes. Electronically Signed   By: Elon Alas M.D.   On: 07/03/2018 17:00    Microbiology: Recent Results (from the past 240 hour(s))  Blood Culture (routine x 2)     Status: None   Collection Time: 06/28/18  9:48 PM  Result Value Ref Range Status   Specimen Description BLOOD LEFT HAND  Final   Special Requests    Final    BOTTLES DRAWN AEROBIC AND ANAEROBIC Blood Culture adequate volume   Culture   Final    NO GROWTH 5 DAYS Performed at [facility], [address], [place], [place] [address]    Report Status 07/03/2018 FINAL  Final  Blood Culture (routine x 2)     Status: None   Collection Time: 06/28/18  9:53 PM  Result Value Ref Range Status   Specimen Description BLOOD RIGHT FOREARM  Final   Special Requests   Final    Blood Culture adequate volume BOTTLES DRAWN AEROBIC AND ANAEROBIC   Culture   Final    NO GROWTH 5 DAYS Performed at [facility], [address], [place], [place] [address]    Report Status 07/03/2018 FINAL  Final  MRSA PCR Screening     Status: None   Collection Time: 06/29/18  3:58 AM  Result Value Ref Range Status   MRSA by PCR NEGATIVE NEGATIVE Final    Comment:        The GeneXpert MRSA Assay (FDA approved for NASAL specimens only), is one component of Bryt[facility] Dray comprehensive MRSA colonization surveillance program. It is not intended to diagnose MRSA infection nor to guide or monitor treatment for MRSA infections. Performed at [facility], [address] [place], [place] [address]   CSF culture with Stat gram stain     Status: None   Collection Time: 07/02/18 11:54 AM  Result Value Ref Range Status   Specimen Description CSF  Final   Special Requests NONE  Final   Gram Stain   Final  WBC PRESENT,BOTH PMN AND MONONUCLEAR NO ORGANISMS SEEN CYTOSPIN SMEAR    Culture   Final    NO GROWTH 3 DAYS Performed at [facility], [place] [address], [place], [place] [address]    Report Status 07/05/2018 FINAL  Final     Labs: Basic Metabolic Panel: Recent Labs  Lab 06/28/18 2148 06/29/18 0556 07/01/18 0408 07/02/18 0559 07/05/18 0533  NA 132* 135 137 140 141  K 3.6 3.5 3.2* 3.4* 3.3*  CL 98 103 103 110 108  CO2 26 25 22 23 26   GLUCOSE 167* 117* 81 92 88  BUN 18 19 17 11 8   CREATININE 1.17 1.05 1.01 0.94 0.83  CALCIUM 9.6 9.3 8.6* 8.5* 9.2  MG  --   --    --   --  2.1   Liver Function Tests: Recent Labs  Lab 06/28/18 2148 06/29/18 0556  AST 29 25  ALT 39 36  ALKPHOS 53 48  BILITOT 1.4* 1.6*  PROT 7.5 7.0  ALBUMIN 4.1 3.8   No results for input(s): LIPASE, AMYLASE in the last 168 hours. No results for input(s): AMMONIA in the last 168 hours. CBC: Recent Labs  Lab 06/28/18 2148 06/29/18 0556 07/01/18 0408 07/02/18 0559 07/04/18 0440  WBC 8.8 9.8 7.8 7.3 9.3  NEUTROABS 7.2  --   --   --   --   HGB 15.9 15.2 14.5 15.3 15.3  HCT 45.5 44.2 42.5 44.3 45.0  MCV 93.8 94.0 93.0 92.1 93.6  PLT 123* 122* 127* 145* 150   Cardiac Enzymes: No results for input(s): CKTOTAL, CKMB, CKMBINDEX, TROPONINI in the last 168 hours. BNP: BNP (last 3 results) No results for input(s): BNP in the last 8760 hours.  ProBNP (last 3 results) No results for input(s): PROBNP in the last 8760 hours.  CBG: Recent Labs  Lab 06/28/18 2122 06/30/18 0431  GLUCAP 147* 111*       Signed:  Fayrene Helper MD.  [facility] 07/05/2018, 1:14 PM

## 2018-07-05 NOTE — Progress Notes (Signed)
Patient discharged home with spouse. Discharge information given. Patient questions asked and answered. Telemetry and IV discontinued. Patient transported from unit via wheelchair with staff. Alex Maddox

## 2018-07-05 NOTE — Care Management Note (Signed)
Case Management Note  Patient Details  Name: LINZY DARLING MRN: [id number] Date of Birth: 1951-01-30  Subjective/Objective:                    Action/Plan: Pt discharging home with self care. Pt has insurance, PCP and transportation home.   Expected Discharge Date:  07/05/18               Expected Discharge Plan:  Home/Self Care  In-House Referral:     Discharge planning Services     Post Acute Care Choice:    Choice offered to:     DME Arranged:    DME Agency:     HH Arranged:    HH Agency:     Status of Service:  Completed, signed off  If discussed at [employer] of Stay Meetings, dates discussed:    Additional Comments:  Pollie Friar, RN 07/05/2018, 3:36 PM

## 2018-07-06 ENCOUNTER — Telehealth: Payer: Self-pay | Admitting: Cardiovascular Disease

## 2018-07-06 ENCOUNTER — Other Ambulatory Visit: Payer: Self-pay | Admitting: Cardiovascular Disease

## 2018-07-06 DIAGNOSIS — I10 Essential (primary) hypertension: Secondary | ICD-10-CM

## 2018-07-06 DIAGNOSIS — Z5181 Encounter for therapeutic drug level monitoring: Secondary | ICD-10-CM

## 2018-07-06 LAB — ENTEROVIRUS PCR: ENTEROVIRUS PCR: NEGATIVE

## 2018-07-06 NOTE — Telephone Encounter (Signed)
Agree with change in statin due to drug interaction, will recheck lipids at 3 months MCr

## 2018-07-06 NOTE — Telephone Encounter (Signed)
New Message:      Pt said he was discharged from the hospital yesterday. He have questions about his medicine, he said some of it was changed.

## 2018-07-06 NOTE — Telephone Encounter (Signed)
Spoke with patient regarding changes in medications. At discharge patient Simvastatin stopped and Pravastatin started, Amlodipine increased from 2.5 mg to 5 mg daily. Advised patient Amlodipine was increased secondary to blood pressure being elevated. Advised patient to make changes as recommended and will forward to Dr Sallyanne Kuster for review

## 2018-07-09 ENCOUNTER — Ambulatory Visit (INDEPENDENT_AMBULATORY_CARE_PROVIDER_SITE_OTHER): Payer: PPO | Admitting: *Deleted

## 2018-07-09 ENCOUNTER — Telehealth: Payer: Self-pay | Admitting: Cardiology

## 2018-07-09 DIAGNOSIS — I4891 Unspecified atrial fibrillation: Secondary | ICD-10-CM | POA: Diagnosis not present

## 2018-07-09 DIAGNOSIS — Z5181 Encounter for therapeutic drug level monitoring: Secondary | ICD-10-CM

## 2018-07-09 LAB — POCT INR: INR: 2.2 (ref 2.0–3.0)

## 2018-07-09 NOTE — Patient Instructions (Signed)
Take 1 tablet tonight, 1/2 tablet Tuesday and Wednesday then resume 1 tablet daily Will finish levaquin on 07/12/18  Recheck in 1 week

## 2018-07-09 NOTE — Telephone Encounter (Signed)
Spoke with pt and reminded pt of remote transmission that is due today. Pt verbalized understanding.   

## 2018-07-10 ENCOUNTER — Other Ambulatory Visit (INDEPENDENT_AMBULATORY_CARE_PROVIDER_SITE_OTHER): Payer: Self-pay | Admitting: *Deleted

## 2018-07-10 DIAGNOSIS — Z86010 Personal history of colonic polyps: Secondary | ICD-10-CM | POA: Insufficient documentation

## 2018-07-10 NOTE — Progress Notes (Signed)
Remote pacemaker transmission.   

## 2018-07-11 NOTE — Telephone Encounter (Signed)
Patient did ask if pacemaker transmission received. Will forward to [place] with Dr Sallyanne Kuster. Advised if not received would receive call back   Advised patient, mailed orders for lab recheck

## 2018-07-12 DIAGNOSIS — R5383 Other fatigue: Secondary | ICD-10-CM | POA: Diagnosis not present

## 2018-07-12 DIAGNOSIS — A419 Sepsis, unspecified organism: Secondary | ICD-10-CM | POA: Diagnosis not present

## 2018-07-12 DIAGNOSIS — G9341 Metabolic encephalopathy: Secondary | ICD-10-CM | POA: Diagnosis not present

## 2018-07-12 DIAGNOSIS — I10 Essential (primary) hypertension: Secondary | ICD-10-CM | POA: Diagnosis not present

## 2018-07-12 DIAGNOSIS — E663 Overweight: Secondary | ICD-10-CM | POA: Diagnosis not present

## 2018-07-12 DIAGNOSIS — Z6827 Body mass index (BMI) 27.0-27.9, adult: Secondary | ICD-10-CM | POA: Diagnosis not present

## 2018-07-12 DIAGNOSIS — E782 Mixed hyperlipidemia: Secondary | ICD-10-CM | POA: Diagnosis not present

## 2018-07-12 DIAGNOSIS — D696 Thrombocytopenia, unspecified: Secondary | ICD-10-CM | POA: Diagnosis not present

## 2018-07-12 DIAGNOSIS — R4182 Altered mental status, unspecified: Secondary | ICD-10-CM | POA: Diagnosis not present

## 2018-07-12 DIAGNOSIS — I4891 Unspecified atrial fibrillation: Secondary | ICD-10-CM | POA: Diagnosis not present

## 2018-07-12 NOTE — Telephone Encounter (Signed)
Informed patient that remote transmission was received. Patient verbalized understanding. 

## 2018-07-13 LAB — CUP PACEART REMOTE DEVICE CHECK
Brady Statistic AP VP Percent: 0.34 %
Brady Statistic AP VS Percent: 7.25 %
Brady Statistic AS VP Percent: 0.38 %
Brady Statistic AS VS Percent: 92.03 %
Implantable Lead Implant Date: 20120820
Implantable Lead Location: 753859
Lead Channel Impedance Value: 392 Ohm
Lead Channel Impedance Value: 448 Ohm
Lead Channel Sensing Intrinsic Amplitude: 6.072 mV
Lead Channel Sensing Intrinsic Amplitude: 6.25 mV
Lead Channel Setting Pacing Amplitude: 2.5 V
Lead Channel Setting Pacing Pulse Width: 0.6 ms
Lead Channel Setting Sensing Sensitivity: 0.9 mV
MDC IDC LEAD IMPLANT DT: 20120820
MDC IDC LEAD LOCATION: 753860
MDC IDC MSMT BATTERY VOLTAGE: 2.93 V
MDC IDC PG IMPLANT DT: 20120820
MDC IDC SESS DTM: 20191001010925
MDC IDC SET LEADCHNL RA PACING AMPLITUDE: 2 V
MDC IDC STAT BRADY RA PERCENT PACED: 6.56 %
MDC IDC STAT BRADY RV PERCENT PACED: 0.63 %

## 2018-07-18 ENCOUNTER — Ambulatory Visit (INDEPENDENT_AMBULATORY_CARE_PROVIDER_SITE_OTHER): Payer: PPO | Admitting: *Deleted

## 2018-07-18 DIAGNOSIS — I4891 Unspecified atrial fibrillation: Secondary | ICD-10-CM

## 2018-07-18 DIAGNOSIS — Z5181 Encounter for therapeutic drug level monitoring: Secondary | ICD-10-CM

## 2018-07-18 LAB — POCT INR: INR: 2.1 (ref 2.0–3.0)

## 2018-07-18 NOTE — Patient Instructions (Signed)
Continue coumadin 1 tablet daily Starting flax seed oil 1 tablet daily  Recheck in 3 weeks

## 2018-08-01 LAB — ARBOVIRUS PANEL, ~~LOC~~ LAB

## 2018-08-03 ENCOUNTER — Other Ambulatory Visit: Payer: Self-pay | Admitting: Cardiovascular Disease

## 2018-08-03 NOTE — Telephone Encounter (Signed)
Call received from pt pharmacy. They received escribe refill auth for Amlodine 5mg  tablet Pt to take 2.5mg  (1/2 tab) daily. pharmacist rqst ok to dispense 2.5mg  tab instead of 5mg . Verbal ok given for Amlodipine 2.5ng daily #30 R-0 pharmacy rqst 90day supply, adv him that #30 is what was auth and I'm not sure why 90day was not [place]. Per notes the medication was not started by Dr.Croitoru, adv him that I will fwd the update to his [place] to f/u with further refills if indicated.

## 2018-08-03 NOTE — Telephone Encounter (Signed)
(  smartpharses not working)  Pharmacy calling about amLODipine ([place]).  Has questions regarding the script.

## 2018-08-08 ENCOUNTER — Ambulatory Visit (INDEPENDENT_AMBULATORY_CARE_PROVIDER_SITE_OTHER): Payer: PPO | Admitting: *Deleted

## 2018-08-08 DIAGNOSIS — I4891 Unspecified atrial fibrillation: Secondary | ICD-10-CM

## 2018-08-08 DIAGNOSIS — Z5181 Encounter for therapeutic drug level monitoring: Secondary | ICD-10-CM | POA: Diagnosis not present

## 2018-08-08 LAB — POCT INR: INR: 2.9 (ref 2.0–3.0)

## 2018-08-14 ENCOUNTER — Telehealth (INDEPENDENT_AMBULATORY_CARE_PROVIDER_SITE_OTHER): Payer: Self-pay | Admitting: *Deleted

## 2018-08-14 ENCOUNTER — Encounter (INDEPENDENT_AMBULATORY_CARE_PROVIDER_SITE_OTHER): Payer: Self-pay | Admitting: *Deleted

## 2018-08-14 MED ORDER — SUPREP BOWEL PREP KIT 17.5-3.13-1.6 GM/177ML PO SOLN: 1.0000 | Freq: Once | ORAL | 0 refills | Status: AC

## 2018-08-14 NOTE — Telephone Encounter (Signed)
OK to hold coumadin x 5 days but patient will need to be Bridged with Lovenox.  Please make sure pt is aware of this when you talk to him.  He is scheduled to see me back on 09/05/18.  I will take care of bridging him at that visit and given him all the instructions then. Lattie Haw

## 2018-08-14 NOTE — Telephone Encounter (Signed)
Patient aware.

## 2018-08-14 NOTE — Telephone Encounter (Signed)
Patient needs suprep 

## 2018-08-14 NOTE — Telephone Encounter (Signed)
Patient is scheduled for colonoscopy 09/20/18 -- he needs to stop Warfarin 5 days prior -- please advise if ok to stop

## 2018-08-24 ENCOUNTER — Telehealth (INDEPENDENT_AMBULATORY_CARE_PROVIDER_SITE_OTHER): Payer: Self-pay | Admitting: *Deleted

## 2018-08-24 NOTE — Telephone Encounter (Signed)
Referring MD/PCP: fusco   Procedure: tcs  Reason/Indication:  Hx polyps  Has patient had this procedure before?  Yes, 2018  If so, when, by whom and where?    Is there a family history of colon cancer?  no  Who?  What age when diagnosed?    Is patient diabetic?   no      Does patient have prosthetic heart valve or mechanical valve?  no  Do you have a pacemaker?  no  Has patient ever had endocarditis? no  Has patient had joint replacement within last 12 months?  no  Is patient constipated or do they take laxatives? no  Does patient have a history of alcohol/drug use?  no  Is patient on blood thinner such as Coumadin, Plavix and/or Aspirin? yes  Medications: warfarin 5 mg daily, finasteride 5 mg daily, amlodipine 2.5 mg daily, carvedilol 12.5 mg bid, simvastatin 40 mg daily, irbesartan 300 mg daily, saw palmetto 450 mg daily, mvi daily, fish oil 1200 mg daily  Allergies: nkda  Medication Adjustment per Dr Lindi Adie, NP: warfarin 5 days - pt will do Lovenox bridge -- see 08/14/18 note  Procedure date & time: 09/20/18 at 1030

## 2018-08-25 ENCOUNTER — Emergency Department (HOSPITAL_COMMUNITY): Payer: PPO

## 2018-08-25 ENCOUNTER — Emergency Department (HOSPITAL_COMMUNITY)
Admission: EM | Admit: 2018-08-25 | Discharge: 2018-08-25 | Disposition: A | Discharge status: Home / Self Care | Payer: PPO | Attending: Emergency Medicine | Admitting: Emergency Medicine

## 2018-08-25 ENCOUNTER — Other Ambulatory Visit: Payer: Self-pay

## 2018-08-25 ENCOUNTER — Encounter (HOSPITAL_COMMUNITY): Payer: Self-pay | Admitting: Emergency Medicine

## 2018-08-25 DIAGNOSIS — Y999 Unspecified external cause status: Secondary | ICD-10-CM | POA: Insufficient documentation

## 2018-08-25 DIAGNOSIS — Z85828 Personal history of other malignant neoplasm of skin: Secondary | ICD-10-CM | POA: Insufficient documentation

## 2018-08-25 DIAGNOSIS — S62655A Nondisplaced fracture of medial phalanx of left ring finger, initial encounter for closed fracture: Secondary | ICD-10-CM | POA: Insufficient documentation

## 2018-08-25 DIAGNOSIS — Y9389 Activity, other specified: Secondary | ICD-10-CM | POA: Insufficient documentation

## 2018-08-25 DIAGNOSIS — Y929 Unspecified place or not applicable: Secondary | ICD-10-CM | POA: Diagnosis not present

## 2018-08-25 DIAGNOSIS — Z79899 Other long term (current) drug therapy: Secondary | ICD-10-CM | POA: Insufficient documentation

## 2018-08-25 DIAGNOSIS — I10 Essential (primary) hypertension: Secondary | ICD-10-CM | POA: Insufficient documentation

## 2018-08-25 DIAGNOSIS — X500XXA Overexertion from strenuous movement or load, initial encounter: Secondary | ICD-10-CM | POA: Insufficient documentation

## 2018-08-25 DIAGNOSIS — Z950 Presence of cardiac pacemaker: Secondary | ICD-10-CM | POA: Insufficient documentation

## 2018-08-25 DIAGNOSIS — S6992XA Unspecified injury of left wrist, hand and finger(s), initial encounter: Secondary | ICD-10-CM | POA: Diagnosis present

## 2018-08-25 DIAGNOSIS — M79642 Pain in left hand: Secondary | ICD-10-CM | POA: Diagnosis not present

## 2018-08-25 NOTE — ED Provider Notes (Signed)
[facility] EMERGENCY DEPARTMENT Provider Note   CSN: [id number] Arrival date & time: 08/25/18  1448     History   Chief Complaint Chief Complaint  Patient presents with  . Finger Injury    HPI Alex Maddox is a 67 y.o. male.  HPI Patient presents the left ring finger injury.  States that his dog on a chain yesterday and jerked pulling on his finger.  States there is some swelling then but now more painful.  Cannot get his ring off.  Has some mild chronic pain in his right middle finger but that is unchanged with this episode.  He is on Coumadin for atrial fibrillation. Past Medical History:  Diagnosis Date  . Abnormal echocardiogram    stress myoview 10/29/2010  . Arthritis    " IN MY BACK "  . Dyslipidemia   . Dysrhythmia    Atrial fibrillation  . Hypertension   . Paroxysmal atrial fibrillation (HCC)   . Presence of permanent cardiac pacemaker   . Sinus node dysfunction (HCC)   . Sleep apnea, obstructive    sleep study 10/09/2007  AHI 5.73/hr  REM AHI 11.90/hr   . 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   LEFT THUMB   . Stroke ([place])   . Syncope    2D Echocardiogram 09/27/2010 EF between 40 to 45%  . Tachycardia-bradycardia ([place])   . Transient ischemic attack (TIA) 06/07    Patient Active Problem List   Diagnosis Date Noted  . History of colonic polyps 07/10/2018  . Sepsis ([place]) 06/29/2018  . Fever in adult >> 103 06/29/2018  . Acute Toxic metabolic encephalopathy due to infection 06/29/2018  . Suspected CNS infection 06/29/2018  . Thrombocytopenia ([place]) 09/25/2017  . Guaiac + stool 05/15/2017  . Left ventricular dysfunction 12/30/2015  . Coronary artery calcification seen on CT scan 09/08/2014  . Ascending aortic aneurysm ([place]) 09/08/2014  . AAA (abdominal aortic aneurysm) without rupture ([place]) 09/08/2014  . History of TIA (transient ischemic attack) 09/08/2014  . Rib fractures   . Head injury   . Fall   . ICH (intracerebral hemorrhage) ([place])     . HLD (hyperlipidemia)   . Essential hypertension   . Syncope 08/13/2014  . Intracranial hemorrhage ([place]) 08/13/2014  . Anticoagulated on Coumadin   . Left rib fracture 08/12/2014  . Encounter for therapeutic drug monitoring 11/07/2013  . Neurocardiogenic syncope 08/26/2013  . Pacemaker - Medtronic REVO dual-chamber implanted August 2012 08/26/2013  . HTN (hypertension) 08/26/2013  . Hyperlipidemia 08/26/2013  . Erectile dysfunction 08/26/2013  . Atrial fibrillation ([place]) 12/14/2012  . Long term current use of anticoagulant therapy 12/14/2012    Past Surgical History:  Procedure Laterality Date  . COLONOSCOPY    . COLONOSCOPY N/A 06/07/2017   Procedure: COLONOSCOPY;  Surgeon: Rogene Houston, MD;  Location: [facility]O SUITE;  Service: Endoscopy;  Laterality: N/A;  9:30  . ELECTROCARDIOGRAM    . INSERT / REPLACE / REMOVE PACEMAKER  05/2011  . LOOP RECORDER IMPLANT  02/02/2011   Medtronic- CRM   . PACEMAKER INSERTION  05/30/2011   Medtronic Revo   last checked 11/07/2012        Home Medications    Prior to Admission medications   Medication Sig Start Date End Date Taking? Authorizing Provider  amLODipine (NORVASC) 5 MG tablet TAKE ONE TABLET (5MG  TOTAL) BY MOUTH DAILY 08/03/18   Croitoru, Mihai, MD  carvedilol (COREG) 12.5 MG tablet TAKE ONE TABLET (12.5MG  TOTAL) BY MOUTH  TWO TIMES DAILY WITH A MEAL 01/22/18   Croitoru, Mihai, MD  diazepam (VALIUM) 2 MG tablet Take 2 mg by mouth every 6 (six) hours as needed for anxiety.    [provider]  finasteride (PROSCAR) 5 MG tablet Take 5 mg by mouth daily.    [provider]  fish oil-omega-3 fatty acids 1000 MG capsule Take 1 g by mouth daily.    [provider]  irbesartan (AVAPRO) 300 MG tablet TAKE ONE (1) TABLET BY MOUTH EVERY DAY 12/12/17   Croitoru, Mihai, MD  Multiple Vitamin (MULTIVITAMIN) tablet Take 1 tablet by mouth daily.    [provider]  pravastatin (PRAVACHOL) 40 MG tablet Take 1  tablet (40 mg total) by mouth every evening. 07/05/18 08/04/18  Elodia Florence., MD  warfarin (COUMADIN) 5 MG tablet TAKE ONE TABLET BY MOUTH DAILY AS DIRECTED BY COUMADIN CLINIC 07/09/18   Croitoru, Dani [place], MD    Family History Family History  Problem Relation Age of Onset  . Hypertension Mother   . Heart attack Father 42       deceased from heart attack  . Hypertension Brother   . Colon cancer Neg Hx     Social History Social History   Tobacco Use  . Smoking status: Never Smoker  . Smokeless tobacco: Never Used  Substance Use Topics  . Alcohol use: Yes    Comment: occasionally  . Drug use: No     Allergies   Patient has no known allergies.   Review of Systems Review of Systems  Constitutional: Negative for appetite change.  Musculoskeletal:       Left ring finger injury.  Skin: Negative for wound.  Neurological: Negative for weakness.     Physical Exam Updated Vital Signs BP (!) 139/92 (BP Location: Right Arm)   Pulse 62   Temp 98.2 F (36.8 C) (Oral)   Resp 17   Ht 6' (1.829 m)   Wt 90.7 kg   SpO2 100%   BMI 27.12 kg/m   Physical Exam  Constitutional: He appears well-developed.  HENT:  Head: Normocephalic.  Musculoskeletal:  Tenderness and ecchymosis over left ring finger.  Wedding ring on and somewhat tight.  Tenderness particularly over the PIP joint with ecchymosis.  Skin intact.  Decreased range of motion due to swelling and pain.  Neurological: He is alert.  Skin: Skin is warm.     ED Treatments / Results  Labs (all labs ordered are listed, but only abnormal results are displayed) Labs Reviewed - No data to display  EKG None  Radiology Dg Hand Complete Left  Result Date: 08/25/2018 CLINICAL DATA:  Left hand pain/injury EXAM: LEFT HAND - COMPLETE 3+ VIEW COMPARISON:  None. FINDINGS: Nondisplaced intra-articular fracture involving the distal aspect of the 4th middle phalanx. Extension into the DIP joint. Associated mild soft  tissue swelling. IMPRESSION: Nondisplaced fracture involving the 4th middle phalanx, as above. Electronically Signed   By: Julian Hy M.D.   On: 08/25/2018 16:09    Procedures Procedures (including critical care time)  Medications Ordered in ED Medications - No data to display   Initial Impression / Assessment and Plan / ED Course  I have reviewed the triage vital signs and the nursing notes.  Pertinent labs & imaging results that were available during my care of the patient were reviewed by me and considered in my medical decision making (see chart for details).     Patient with finger fracture.  Ring removed.  Splinted.  Follow-up with hand surgery.  Final Clinical Impressions(s) / ED Diagnoses   Final diagnoses:  Closed nondisplaced fracture of middle phalanx of left ring finger, initial encounter    ED Discharge Orders    None       Davonna Belling, MD 08/25/18 1656

## 2018-08-25 NOTE — ED Triage Notes (Signed)
Patient states he had his dog on a chain and it jerked his hand. Complaining of pain and swelling to left ring finger. Patient has wedding band on finger but states he is unable to get it off.

## 2018-08-27 NOTE — Telephone Encounter (Signed)
agree

## 2018-08-29 DIAGNOSIS — S62655A Nondisplaced fracture of medial phalanx of left ring finger, initial encounter for closed fracture: Secondary | ICD-10-CM | POA: Diagnosis not present

## 2018-09-03 DIAGNOSIS — E782 Mixed hyperlipidemia: Secondary | ICD-10-CM | POA: Diagnosis not present

## 2018-09-03 DIAGNOSIS — J309 Allergic rhinitis, unspecified: Secondary | ICD-10-CM | POA: Diagnosis not present

## 2018-09-03 DIAGNOSIS — Z6828 Body mass index (BMI) 28.0-28.9, adult: Secondary | ICD-10-CM | POA: Diagnosis not present

## 2018-09-03 DIAGNOSIS — Z1389 Encounter for screening for other disorder: Secondary | ICD-10-CM | POA: Diagnosis not present

## 2018-09-03 DIAGNOSIS — Z950 Presence of cardiac pacemaker: Secondary | ICD-10-CM | POA: Diagnosis not present

## 2018-09-03 DIAGNOSIS — Z0001 Encounter for general adult medical examination with abnormal findings: Secondary | ICD-10-CM | POA: Diagnosis not present

## 2018-09-03 DIAGNOSIS — I10 Essential (primary) hypertension: Secondary | ICD-10-CM | POA: Diagnosis not present

## 2018-09-03 DIAGNOSIS — N529 Male erectile dysfunction, unspecified: Secondary | ICD-10-CM | POA: Diagnosis not present

## 2018-09-03 DIAGNOSIS — E663 Overweight: Secondary | ICD-10-CM | POA: Diagnosis not present

## 2018-09-03 DIAGNOSIS — I4891 Unspecified atrial fibrillation: Secondary | ICD-10-CM | POA: Diagnosis not present

## 2018-09-05 ENCOUNTER — Ambulatory Visit (INDEPENDENT_AMBULATORY_CARE_PROVIDER_SITE_OTHER): Payer: PPO | Admitting: *Deleted

## 2018-09-05 DIAGNOSIS — I4891 Unspecified atrial fibrillation: Secondary | ICD-10-CM

## 2018-09-05 DIAGNOSIS — Z5181 Encounter for therapeutic drug level monitoring: Secondary | ICD-10-CM | POA: Diagnosis not present

## 2018-09-05 LAB — POCT INR: INR: 3 (ref 2.0–3.0)

## 2018-09-05 MED ORDER — ENOXAPARIN SODIUM 150 MG/ML ~~LOC~~ SOLN: 150.0000 mg | SUBCUTANEOUS | 0 refills | Status: DC

## 2018-09-05 NOTE — Patient Instructions (Addendum)
Pending colonoscopy on 09/20/18 by Dr Laural Golden.  Will hold coumadin 5 days before procedure and bridge with Lovenox.    12/6  Take last dose of coumadin 12/7  No lovenox or coumadin 12/8  Lovenox 150mg  sq at 8am 12/9  Lovenox 150mg  sq at 8am 12/10  Lovenox 150mg  sq at 8am 12/11 Lovenox 150mg  sq at 8am 12/12 No Lovenox------procedure-----coumadin 7.5mg  pm 12/13  Lovenox 150mg  sq at 8am and coumadin 7.5mg  pm 12/14   Lovenox 150mg  sq at 8am and coumadin 7.5mg  pm 12/15   Lovenox 150mg  sq at 8am and coumadin 5mg  pm 12/16   Lovenox 150mg  sq at 8am and coumadin 5mg  pm 12/17   Lovenox 150mg  sq at 8am and coumadin 5mg  pm 12/18   Lovenox 150mg  sq at 8am and appt at 1:00pm

## 2018-09-19 ENCOUNTER — Other Ambulatory Visit: Payer: Self-pay | Admitting: Cardiovascular Disease

## 2018-09-20 ENCOUNTER — Encounter (HOSPITAL_COMMUNITY)
Admission: RE | Disposition: A | Discharge status: Home / Self Care | Payer: Self-pay | Source: Home / Self Care | Attending: Internal Medicine

## 2018-09-20 ENCOUNTER — Ambulatory Visit (HOSPITAL_COMMUNITY)
Admission: RE | Admit: 2018-09-20 | Discharge: 2018-09-20 | Disposition: A | Discharge status: Home / Self Care | Payer: PPO | Attending: Internal Medicine | Admitting: Internal Medicine

## 2018-09-20 ENCOUNTER — Other Ambulatory Visit: Payer: Self-pay

## 2018-09-20 ENCOUNTER — Encounter (HOSPITAL_COMMUNITY): Payer: Self-pay | Admitting: *Deleted

## 2018-09-20 DIAGNOSIS — M1990 Unspecified osteoarthritis, unspecified site: Secondary | ICD-10-CM | POA: Diagnosis not present

## 2018-09-20 DIAGNOSIS — D123 Benign neoplasm of transverse colon: Secondary | ICD-10-CM | POA: Insufficient documentation

## 2018-09-20 DIAGNOSIS — Z1211 Encounter for screening for malignant neoplasm of colon: Secondary | ICD-10-CM | POA: Diagnosis not present

## 2018-09-20 DIAGNOSIS — D120 Benign neoplasm of cecum: Secondary | ICD-10-CM | POA: Insufficient documentation

## 2018-09-20 DIAGNOSIS — K644 Residual hemorrhoidal skin tags: Secondary | ICD-10-CM | POA: Insufficient documentation

## 2018-09-20 DIAGNOSIS — I480 Paroxysmal atrial fibrillation: Secondary | ICD-10-CM | POA: Diagnosis not present

## 2018-09-20 DIAGNOSIS — Z8673 Personal history of transient ischemic attack (TIA), and cerebral infarction without residual deficits: Secondary | ICD-10-CM | POA: Diagnosis not present

## 2018-09-20 DIAGNOSIS — E785 Hyperlipidemia, unspecified: Secondary | ICD-10-CM | POA: Insufficient documentation

## 2018-09-20 DIAGNOSIS — G4733 Obstructive sleep apnea (adult) (pediatric): Secondary | ICD-10-CM | POA: Insufficient documentation

## 2018-09-20 DIAGNOSIS — Z86010 Personal history of colonic polyps: Secondary | ICD-10-CM | POA: Insufficient documentation

## 2018-09-20 DIAGNOSIS — Z950 Presence of cardiac pacemaker: Secondary | ICD-10-CM | POA: Diagnosis not present

## 2018-09-20 DIAGNOSIS — Z09 Encounter for follow-up examination after completed treatment for conditions other than malignant neoplasm: Secondary | ICD-10-CM | POA: Diagnosis not present

## 2018-09-20 DIAGNOSIS — Z79899 Other long term (current) drug therapy: Secondary | ICD-10-CM | POA: Insufficient documentation

## 2018-09-20 DIAGNOSIS — Z7901 Long term (current) use of anticoagulants: Secondary | ICD-10-CM | POA: Insufficient documentation

## 2018-09-20 DIAGNOSIS — I10 Essential (primary) hypertension: Secondary | ICD-10-CM | POA: Diagnosis not present

## 2018-09-20 HISTORY — PX: COLONOSCOPY: SHX5424

## 2018-09-20 HISTORY — PX: POLYPECTOMY: SHX5525

## 2018-09-20 SURGERY — COLONOSCOPY
Anesthesia: Moderate Sedation

## 2018-09-20 MED ORDER — MEPERIDINE HCL 50 MG/ML IJ SOLN
INTRAMUSCULAR | Status: AC
  Filled 2018-09-20: qty 1

## 2018-09-20 MED ORDER — MIDAZOLAM HCL 5 MG/5ML IJ SOLN
INTRAMUSCULAR | Status: DC | PRN
  Administered 2018-09-20 (×2): 2 mg via INTRAVENOUS

## 2018-09-20 MED ORDER — SODIUM CHLORIDE 0.9 % IV SOLN
INTRAVENOUS | Status: DC
  Administered 2018-09-20: 10:00:00 via INTRAVENOUS

## 2018-09-20 MED ORDER — MIDAZOLAM HCL 5 MG/5ML IJ SOLN
INTRAMUSCULAR | Status: AC
  Filled 2018-09-20: qty 10

## 2018-09-20 MED ORDER — MEPERIDINE HCL 50 MG/ML IJ SOLN
INTRAMUSCULAR | Status: DC | PRN
  Administered 2018-09-20 (×2): 25 mg via INTRAVENOUS

## 2018-09-20 MED ORDER — STERILE WATER FOR IRRIGATION IR SOLN
Status: DC | PRN
  Administered 2018-09-20: 10:00:00

## 2018-09-20 NOTE — H&P (Signed)
Alex Maddox is an 67 y.o. male.   Chief Complaint: Patient is here for colonoscopy. HPI: Patient is 67 year old Caucasian male with multiple medical problems he underwent colonoscopy last year and was found to have 2 tubular adenomas and one sessile serrated polyp.  His prep was suboptimal.  He was therefore advised to return back in a year.  He denies abdominal pain change in bowel habits or rectal bleeding. He was hospitalized back in September this year for febrile illness thought to be sepsis.  He believes it was a result of flu vaccination. Family history is negative for CRC. Patient has been off warfarin for 1 week and he has been bridged with enoxaparin and the last dose was yesterday morning.  Past Medical History:  Diagnosis Date  . Abnormal echocardiogram    stress myoview 10/29/2010  . Arthritis    " IN MY BACK "  . Dyslipidemia   . Dysrhythmia    Atrial fibrillation  . Hypertension   . Paroxysmal atrial fibrillation (HCC)   . Presence of permanent cardiac pacemaker   . Sinus node dysfunction (HCC)   . Sleep apnea, obstructive    sleep study 10/09/2007  AHI 5.73/hr  REM AHI 11.90/hr   . 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   LEFT THUMB   . Stroke ([place])   . Syncope    2D Echocardiogram 09/27/2010 EF between 40 to 45%  . Tachycardia-bradycardia ([place])   . Transient ischemic attack (TIA) 06/07    Past Surgical History:  Procedure Laterality Date  . COLONOSCOPY    . COLONOSCOPY N/A 06/07/2017   Procedure: COLONOSCOPY;  Surgeon: Rogene Houston, MD;  Location: [facility];  Service: Endoscopy;  Laterality: N/A;  9:30  . ELECTROCARDIOGRAM    . INSERT / REPLACE / REMOVE PACEMAKER  05/2011  . LOOP RECORDER IMPLANT  02/02/2011   Medtronic- CRM   . PACEMAKER INSERTION  05/30/2011   Medtronic Revo   last checked 11/07/2012    Family History  Problem Relation Age of Onset  . Hypertension Mother   . Heart attack Father 53       deceased from heart attack  .  Hypertension Brother   . Colon cancer Neg Hx    Social History:  reports that he has never smoked. He has never used smokeless tobacco. He reports current alcohol use. He reports that he does not use drugs.  Allergies: No Known Allergies  Medications Prior to Admission  Medication Sig Dispense Refill  . amLODipine (NORVASC) 5 MG tablet TAKE ONE TABLET (5MG  TOTAL) BY MOUTH DAILY (Patient taking differently: Take 5 mg by mouth daily. ) 30 tablet 0  . carvedilol (COREG) 12.5 MG tablet TAKE ONE TABLET (12.5MG  TOTAL) BY MOUTH TWO TIMES DAILY WITH A MEAL (Patient taking differently: Take 12.5 mg by mouth 2 (two) times daily. ) 180 tablet 2  . diazepam (VALIUM) 2 MG tablet Take 2 mg by mouth every 8 (eight) hours as needed for anxiety.     . enoxaparin (LOVENOX) 150 MG/ML injection Inject 1 mL (150 mg total) into the skin daily for 10 days. 10 mL 0  . finasteride (PROSCAR) 5 MG tablet Take 5 mg by mouth daily.    . Flaxseed Oil OIL Take 1,300 mg by mouth daily.    . irbesartan (AVAPRO) 300 MG tablet TAKE ONE (1) TABLET BY MOUTH EVERY DAY (Patient taking differently: Take 300 mg by mouth daily. ) 90 tablet 3  .  Multiple Vitamin (MULTIVITAMIN WITH MINERALS) TABS tablet Take 1 tablet by mouth daily. Adult Multivitamin 50+    . Omega-3 Fatty Acids (FISH OIL) 1200 MG CAPS Take 1,200 mg by mouth daily.    Vladimir Faster Glycol-Propyl Glycol (LUBRICANT EYE DROPS) 0.4-0.3 % SOLN Place 1 drop into both eyes 3 (three) times daily as needed (for dry eyes.).    Marland Kitchen pravastatin (PRAVACHOL) 40 MG tablet Take 1 tablet (40 mg total) by mouth every evening. 30 tablet 0  . Saw Palmetto 450 MG CAPS Take 900 mg by mouth daily.    Marland Kitchen warfarin (COUMADIN) 5 MG tablet TAKE ONE TABLET BY MOUTH DAILY AS DIRECTED BY COUMADIN CLINIC (Patient taking differently: Take 5 mg by mouth every evening. Take one tablet by mouth daily as directed by coumadin clinic) 90 tablet 1    No results found for this or any previous visit (from the past  48 hour(s)). No results found.  ROS  Blood pressure 122/84, pulse 61, temperature 97.9 F (36.6 C), temperature source Oral, resp. rate 17, height 6' (1.829 m), weight 90.7 kg, SpO2 100 %. Physical Exam  Constitutional: He appears well-developed and well-nourished.  HENT:  Mouth/Throat: Oropharynx is clear and moist.  Eyes: Conjunctivae are normal. No scleral icterus.  Neck: No thyromegaly present.  Cardiovascular: Normal rate, regular rhythm and normal heart sounds.  No murmur heard. Respiratory: Effort normal and breath sounds normal.  Pacemaker in right pectoral region.  GI: Soft. He exhibits no distension and no mass.  Musculoskeletal:        General: No edema.  Neurological: He is alert.  Skin: Skin is warm and dry.     Assessment/Plan History of colonic adenomas. Last colonoscopy was was in August 2018 and prep was suboptimal. Surveillance colonoscopy.  Hildred Laser, MD 09/20/2018, 10:15 AM

## 2018-09-20 NOTE — Op Note (Signed)
[facility] Patient Name: Alex Maddox Procedure Date: 09/20/2018 9:53 AM MRN: [id number] Date of Birth: 24-Aug-1951 Attending MD: Hildred Laser , MD CSN: [id number] Age: 67 Admit Type: Outpatient Procedure:                Colonoscopy Indications:              High risk colon cancer surveillance: Personal                            history of colonic polyps Providers:                Hildred Laser, MD, [place]is Peak B. Sharon Seller, RN, Aram Candela Referring MD:             Redmond School, MD Medicines:                Meperidine 50 mg IV, Midazolam 4 mg IV Complications:            No immediate complications. Estimated Blood Loss:     Estimated blood loss was minimal. Procedure:                Pre-Anesthesia Assessment:                           - Prior to the procedure, a History and Physical                            was performed, and patient medications and                            allergies were reviewed. The patient's tolerance of                            previous anesthesia was also reviewed. The risks                            and benefits of the procedure and the sedation                            options and risks were discussed with the patient.                            All questions were answered, and informed consent                            was obtained. Prior Anticoagulants: The patient                            last took Coumadin (warfarin) 7 days and Lovenox                            (enoxaparin) 1 day prior to the procedure. ASA  Grade Assessment: III - A patient with severe                            systemic disease. After reviewing the risks and                            benefits, the patient was deemed in satisfactory                            condition to undergo the procedure.                           After obtaining informed consent, the colonoscope                            was passed under  direct vision. Throughout the                            procedure, the patient's blood pressure, pulse, and                            oxygen saturations were monitored continuously. The                            CF-HQ190L ([id number]) scope was introduced through                            the anus and advanced to the the cecum, identified                            by appendiceal orifice and ileocecal valve. The                            colonoscopy was performed without difficulty. The                            patient tolerated the procedure well. The quality                            of the bowel preparation was excellent. The                            ileocecal valve, appendiceal orifice, and rectum                            were photographed. Scope In: 10:25:19 AM Scope Out: 10:44:58 AM Scope Withdrawal Time: 0 hours 13 minutes 36 seconds  Total Procedure Duration: 0 hours 19 minutes 39 seconds  Findings:      The perianal and digital rectal examinations were normal.      Two sessile polyps were found in the hepatic flexure and cecum. The       polyps were small in size. These polyps were removed with a cold snare.       Resection and retrieval were complete. The pathology specimen  was placed       into Bottle Number 1.      The exam was otherwise normal throughout the examined colon.      External hemorrhoids were found during retroflexion. The hemorrhoids       were small. Impression:               - Two small polyps at the hepatic flexure and in                            the cecum, removed with a cold snare. Resected and                            retrieved.                           - External hemorrhoids. Moderate Sedation:      Moderate (conscious) sedation was administered by the endoscopy nurse       and supervised by the endoscopist. The following parameters were       monitored: oxygen saturation, heart rate, blood pressure, CO2       capnography and response to  care. Total physician intraservice time was       25 minutes. Recommendation:           - Patient has a contact number available for                            emergencies. The signs and symptoms of potential                            delayed complications were discussed with the                            patient. Return to normal activities tomorrow.                            Written discharge instructions were provided to the                            patient.                           - Resume previous diet today.                           - Continue present medications.                           - Resume Coumadin (warfarin) today and Lovenox                            (enoxaparin) tomorrow at prior doses. Refer to                            Coumadin Clinic for further adjustment of therapy.                           -  Await pathology results.                           - Repeat colonoscopy in 5 years for surveillance. Procedure Code(s):        --- Professional ---                           [phone], Colonoscopy, flexible; with removal of                            tumor(s), polyp(s), or other lesion(s) by snare                            technique                           99153, Moderate sedation; each additional 15                            minutes intraservice time                           G0500, Moderate sedation services provided by the                            same physician or other qualified health care                            professional performing a gastrointestinal                            endoscopic service that sedation supports,                            requiring the presence of an independent trained                            observer to assist in the monitoring of the                            patient's level of consciousness and physiological                            status; initial 15 minutes of intra-service time;                            patient  age 76 years or older (additional time may                            be reported with [phone], as appropriate) Diagnosis Code(s):        --- Professional ---                           Z86.010, Personal history of colonic polyps  D12.3, Benign neoplasm of transverse colon (hepatic                            flexure or splenic flexure)                           D12.0, Benign neoplasm of cecum                           K64.4, Residual hemorrhoidal skin tags CPT copyright 2018 [employer]. All rights reserved. The codes documented in this report are preliminary and upon coder review may  be revised to meet current compliance requirements. Hildred Laser, MD Hildred Laser, MD 09/20/2018 10:53:17 AM This report has been signed electronically. Number of Addenda: 0

## 2018-09-20 NOTE — Discharge Instructions (Signed)
Resume enoxaparin on 09/21/2018. Resume warfarin today at usual dose.  INR check in 7 to 10 days. Resume other medications as before. Resume usual diet. No driving for 24 hours. Physician will call with biopsy results. Next colonoscopy in 5 years.   Colonoscopy, Adult, Care After This sheet gives you information about how to care for yourself after your procedure. Your doctor may also give you more specific instructions. If you have problems or questions, call your doctor. Follow these instructions at home: General instructions   For the first 24 hours after the procedure: ? Do not drive or use machinery. ? Do not sign important documents. ? Do not drink alcohol. ? Do your daily activities more slowly than normal. ? Eat foods that are soft and easy to digest. ? Rest often.  Take over-the-counter or prescription medicines only as told by your doctor.  It is up to you to get the results of your procedure. Ask your doctor, or the department performing the procedure, when your results will be ready. To help cramping and bloating:  Try walking around.  Put heat on your belly (abdomen) as told by your doctor. Use a heat source that your doctor recommends, such as a moist heat pack or a heating pad. ? Put a towel between your skin and the heat source. ? Leave the heat on for 20-30 minutes. ? Remove the heat if your skin turns bright red. This is especially important if you cannot feel pain, heat, or cold. You can get burned. Eating and drinking  Drink enough fluid to keep your pee (urine) clear or pale yellow.  Return to your normal diet as told by your doctor. Avoid heavy or fried foods that are hard to digest.  Avoid drinking alcohol for as long as told by your doctor. Contact a doctor if:  You have blood in your poop (stool) 2-3 days after the procedure. Get help right away if:  You have more than a small amount of blood in your poop.  You see large clumps of tissue (blood  clots) in your poop.  Your belly is swollen.  You feel sick to your stomach (nauseous).  You throw up (vomit).  You have a fever.  You have belly pain that gets worse, and medicine does not help your pain. This information is not intended to replace advice given to you by your health care provider. Make sure you discuss any questions you have with your health care provider. Document Released: 10/29/2010 Document Revised: 06/20/2016 Document Reviewed: 06/20/2016 Elsevier Interactive Patient Education  2017 [employer].  Colon Polyps Polyps are tissue growths inside the body. Polyps can grow in many places, including the large intestine (colon). A polyp may be a round bump or a mushroom-shaped growth. You could have one polyp or several. Most colon polyps are noncancerous (benign). However, some colon polyps can become cancerous over time. What are the causes? The exact cause of colon polyps is not known. What increases the risk? This condition is more likely to develop in people who:  Have a family history of colon cancer or colon polyps.  Are older than 53 or older than 45 if they are African American.  Have inflammatory bowel disease, such as ulcerative colitis or Crohn disease.  Are overweight.  Smoke cigarettes.  Do not get enough exercise.  Drink too much alcohol.  Eat a diet that is: ? High in fat and red meat. ? Low in fiber.  Had childhood cancer that was  treated with abdominal radiation.  What are the signs or symptoms? Most polyps do not cause symptoms. If you have symptoms, they may include:  Blood coming from your rectum when having a bowel movement.  Blood in your stool.The stool may look dark red or black.  A change in bowel habits, such as constipation or diarrhea.  How is this diagnosed? This condition is diagnosed with a colonoscopy. This is a procedure that uses a lighted, flexible scope to look at the inside of your colon. How is this  treated? Treatment for this condition involves removing any polyps that are found. Those polyps will then be tested for cancer. If cancer is found, your health care provider will talk to you about options for colon cancer treatment. Follow these instructions at home: Diet  Eat plenty of fiber, such as fruits, vegetables, and whole grains.  Eat foods that are high in calcium and vitamin D, such as milk, cheese, yogurt, eggs, liver, fish, and broccoli.  Limit foods high in fat, red meats, and processed meats, such as hot dogs, sausage, bacon, and lunch meats.  Maintain a healthy weight, or lose weight if recommended by your health care provider. General instructions  Do not smoke cigarettes.  Do not drink alcohol excessively.  Keep all follow-up visits as told by your health care provider. This is important. This includes keeping regularly scheduled colonoscopies. Talk to your health care provider about when you need a colonoscopy.  Exercise every day or as told by your health care provider. Contact a health care provider if:  You have new or worsening bleeding during a bowel movement.  You have new or increased blood in your stool.  You have a change in bowel habits.  You unexpectedly lose weight. This information is not intended to replace advice given to you by your health care provider. Make sure you discuss any questions you have with your health care provider. Document Released: 06/22/2004 Document Revised: 03/03/2016 Document Reviewed: 08/17/2015 Elsevier Interactive Patient Education  [employer].

## 2018-09-25 ENCOUNTER — Encounter (HOSPITAL_COMMUNITY): Payer: Self-pay | Admitting: Internal Medicine

## 2018-09-26 ENCOUNTER — Ambulatory Visit (INDEPENDENT_AMBULATORY_CARE_PROVIDER_SITE_OTHER): Payer: PPO | Admitting: *Deleted

## 2018-09-26 DIAGNOSIS — Z5181 Encounter for therapeutic drug level monitoring: Secondary | ICD-10-CM

## 2018-09-26 DIAGNOSIS — I4891 Unspecified atrial fibrillation: Secondary | ICD-10-CM

## 2018-09-26 LAB — POCT INR: INR: 1.9 — AB (ref 2.0–3.0)

## 2018-09-26 NOTE — Patient Instructions (Signed)
Take coumadin 1 1/2 tablets tonight then resume 1 tablet daily Stop Lovenox. Took last dose this morning.  Recheck 10/17/18

## 2018-10-08 ENCOUNTER — Ambulatory Visit (INDEPENDENT_AMBULATORY_CARE_PROVIDER_SITE_OTHER): Payer: PPO

## 2018-10-08 ENCOUNTER — Telehealth: Payer: Self-pay

## 2018-10-08 DIAGNOSIS — I495 Sick sinus syndrome: Secondary | ICD-10-CM | POA: Diagnosis not present

## 2018-10-08 DIAGNOSIS — I4891 Unspecified atrial fibrillation: Secondary | ICD-10-CM

## 2018-10-08 NOTE — Telephone Encounter (Signed)
Spoke with spouse to remind of missed remote transmission 

## 2018-10-09 LAB — CUP PACEART REMOTE DEVICE CHECK
Battery Voltage: 2.92 V
Brady Statistic AP VS Percent: 21.78 %
Brady Statistic AS VS Percent: 77.68 %
Brady Statistic RA Percent Paced: 20.48 %
Brady Statistic RV Percent Paced: 0.52 %
Date Time Interrogation Session: 20191231045712
Implantable Lead Implant Date: 20120820
Implantable Lead Location: 753860
Implantable Pulse Generator Implant Date: 20120820
Lead Channel Sensing Intrinsic Amplitude: 6.512 mV
Lead Channel Setting Pacing Amplitude: 2 V
MDC IDC LEAD IMPLANT DT: 20120820
MDC IDC LEAD LOCATION: 753859
MDC IDC MSMT LEADCHNL RA IMPEDANCE VALUE: 440 Ohm
MDC IDC MSMT LEADCHNL RV IMPEDANCE VALUE: 464 Ohm
MDC IDC MSMT LEADCHNL RV SENSING INTR AMPL: 7.237 mV
MDC IDC SET LEADCHNL RV PACING AMPLITUDE: 2.5 V
MDC IDC SET LEADCHNL RV PACING PULSEWIDTH: 0.6 ms
MDC IDC SET LEADCHNL RV SENSING SENSITIVITY: 0.9 mV
MDC IDC STAT BRADY AP VP PERCENT: 0.22 %
MDC IDC STAT BRADY AS VP PERCENT: 0.33 %

## 2018-10-09 NOTE — Progress Notes (Signed)
Remote pacemaker transmission.   

## 2018-10-11 DIAGNOSIS — D696 Thrombocytopenia, unspecified: Secondary | ICD-10-CM | POA: Diagnosis not present

## 2018-10-11 DIAGNOSIS — Z6828 Body mass index (BMI) 28.0-28.9, adult: Secondary | ICD-10-CM | POA: Diagnosis not present

## 2018-10-11 DIAGNOSIS — I714 Abdominal aortic aneurysm, without rupture: Secondary | ICD-10-CM | POA: Diagnosis not present

## 2018-10-11 DIAGNOSIS — R319 Hematuria, unspecified: Secondary | ICD-10-CM | POA: Diagnosis not present

## 2018-10-17 ENCOUNTER — Ambulatory Visit (INDEPENDENT_AMBULATORY_CARE_PROVIDER_SITE_OTHER): Payer: PPO | Admitting: Pharmacist

## 2018-10-17 DIAGNOSIS — Z5181 Encounter for therapeutic drug level monitoring: Secondary | ICD-10-CM

## 2018-10-17 DIAGNOSIS — I4891 Unspecified atrial fibrillation: Secondary | ICD-10-CM

## 2018-10-17 LAB — POCT INR: INR: 2.5 (ref 2.0–3.0)

## 2018-10-17 NOTE — Patient Instructions (Signed)
Description   Continue 1 tablet daily  Recheck in 4 weeks

## 2018-11-14 ENCOUNTER — Ambulatory Visit (INDEPENDENT_AMBULATORY_CARE_PROVIDER_SITE_OTHER): Payer: PPO | Admitting: Pharmacist

## 2018-11-14 DIAGNOSIS — Z5181 Encounter for therapeutic drug level monitoring: Secondary | ICD-10-CM | POA: Diagnosis not present

## 2018-11-14 DIAGNOSIS — I4891 Unspecified atrial fibrillation: Secondary | ICD-10-CM | POA: Diagnosis not present

## 2018-11-14 LAB — POCT INR: INR: 2.7 (ref 2.0–3.0)

## 2018-11-14 NOTE — Patient Instructions (Signed)
Description   Continue 1 tablet daily.  Recheck in 6 weeks       

## 2018-11-21 ENCOUNTER — Other Ambulatory Visit: Payer: Self-pay | Admitting: Cardiovascular Disease

## 2018-11-21 DIAGNOSIS — Z6829 Body mass index (BMI) 29.0-29.9, adult: Secondary | ICD-10-CM | POA: Diagnosis not present

## 2018-11-21 DIAGNOSIS — J029 Acute pharyngitis, unspecified: Secondary | ICD-10-CM | POA: Diagnosis not present

## 2018-11-21 DIAGNOSIS — R05 Cough: Secondary | ICD-10-CM | POA: Diagnosis not present

## 2018-11-26 ENCOUNTER — Other Ambulatory Visit: Payer: Self-pay | Admitting: Cardiovascular Disease

## 2018-12-07 DIAGNOSIS — I10 Essential (primary) hypertension: Secondary | ICD-10-CM | POA: Diagnosis not present

## 2018-12-07 DIAGNOSIS — R809 Proteinuria, unspecified: Secondary | ICD-10-CM | POA: Diagnosis not present

## 2018-12-07 DIAGNOSIS — D509 Iron deficiency anemia, unspecified: Secondary | ICD-10-CM | POA: Diagnosis not present

## 2018-12-07 DIAGNOSIS — N183 Chronic kidney disease, stage 3 (moderate): Secondary | ICD-10-CM | POA: Diagnosis not present

## 2018-12-07 DIAGNOSIS — E559 Vitamin D deficiency, unspecified: Secondary | ICD-10-CM | POA: Diagnosis not present

## 2018-12-07 DIAGNOSIS — Z79899 Other long term (current) drug therapy: Secondary | ICD-10-CM | POA: Diagnosis not present

## 2018-12-19 DIAGNOSIS — R809 Proteinuria, unspecified: Secondary | ICD-10-CM | POA: Diagnosis not present

## 2018-12-19 DIAGNOSIS — N182 Chronic kidney disease, stage 2 (mild): Secondary | ICD-10-CM | POA: Diagnosis not present

## 2018-12-26 ENCOUNTER — Other Ambulatory Visit: Payer: Self-pay

## 2018-12-26 ENCOUNTER — Ambulatory Visit (INDEPENDENT_AMBULATORY_CARE_PROVIDER_SITE_OTHER): Payer: PPO | Admitting: *Deleted

## 2018-12-26 DIAGNOSIS — N3941 Urge incontinence: Secondary | ICD-10-CM | POA: Diagnosis not present

## 2018-12-26 DIAGNOSIS — Z5181 Encounter for therapeutic drug level monitoring: Secondary | ICD-10-CM

## 2018-12-26 DIAGNOSIS — I4891 Unspecified atrial fibrillation: Secondary | ICD-10-CM | POA: Diagnosis not present

## 2018-12-26 DIAGNOSIS — N401 Enlarged prostate with lower urinary tract symptoms: Secondary | ICD-10-CM | POA: Diagnosis not present

## 2018-12-26 DIAGNOSIS — R350 Frequency of micturition: Secondary | ICD-10-CM | POA: Diagnosis not present

## 2018-12-26 DIAGNOSIS — Z125 Encounter for screening for malignant neoplasm of prostate: Secondary | ICD-10-CM | POA: Diagnosis not present

## 2018-12-26 DIAGNOSIS — N5201 Erectile dysfunction due to arterial insufficiency: Secondary | ICD-10-CM | POA: Diagnosis not present

## 2018-12-26 LAB — POCT INR: INR: 2.4 (ref 2.0–3.0)

## 2018-12-26 NOTE — Patient Instructions (Signed)
Continue 1 tablet daily.  Recheck in 6 weeks 

## 2019-01-07 ENCOUNTER — Other Ambulatory Visit: Payer: Self-pay

## 2019-01-07 ENCOUNTER — Encounter: Payer: PPO | Admitting: *Deleted

## 2019-01-08 ENCOUNTER — Other Ambulatory Visit: Payer: Self-pay | Admitting: Cardiovascular Disease

## 2019-01-08 ENCOUNTER — Telehealth: Payer: Self-pay

## 2019-01-08 NOTE — Telephone Encounter (Signed)
Left message for patient to remind of missed remote transmission.  

## 2019-01-09 ENCOUNTER — Telehealth: Payer: Self-pay | Admitting: Cardiology

## 2019-01-09 NOTE — Telephone Encounter (Signed)
Patient called wanting to know if we received his remote transmission that he sent Tuesday 01/08/2019. I informed pt that we did not receive the transmission. Pt has a 2490 monitor w/ wirex adapter. I believe pt needs a new monitor. I instructed pt to call tech support for further assistance trouble shooting monitor and if that is unsuccessful they will order him a new monitor. Pt verbalized understanding.

## 2019-01-15 ENCOUNTER — Ambulatory Visit (INDEPENDENT_AMBULATORY_CARE_PROVIDER_SITE_OTHER): Payer: PPO | Admitting: *Deleted

## 2019-01-15 ENCOUNTER — Other Ambulatory Visit: Payer: Self-pay

## 2019-01-15 DIAGNOSIS — M545 Low back pain: Secondary | ICD-10-CM | POA: Diagnosis not present

## 2019-01-15 DIAGNOSIS — M25561 Pain in right knee: Secondary | ICD-10-CM | POA: Diagnosis not present

## 2019-01-15 DIAGNOSIS — Z1159 Encounter for screening for other viral diseases: Secondary | ICD-10-CM | POA: Diagnosis not present

## 2019-01-15 DIAGNOSIS — G8929 Other chronic pain: Secondary | ICD-10-CM | POA: Diagnosis not present

## 2019-01-15 DIAGNOSIS — I495 Sick sinus syndrome: Secondary | ICD-10-CM

## 2019-01-15 DIAGNOSIS — M1711 Unilateral primary osteoarthritis, right knee: Secondary | ICD-10-CM | POA: Diagnosis not present

## 2019-01-15 LAB — CUP PACEART REMOTE DEVICE CHECK
Battery Voltage: 2.92 V
Brady Statistic AP VP Percent: 0.31 %
Brady Statistic AP VS Percent: 23.78 %
Brady Statistic AS VP Percent: 0.48 %
Brady Statistic AS VS Percent: 75.43 %
Brady Statistic RA Percent Paced: 22.12 %
Brady Statistic RV Percent Paced: 0.75 %
Date Time Interrogation Session: 20200407131609
Implantable Lead Implant Date: 20120820
Implantable Lead Implant Date: 20120820
Implantable Lead Location: 753859
Implantable Lead Location: 753860
Implantable Pulse Generator Implant Date: 20120820
Lead Channel Impedance Value: 424 Ohm
Lead Channel Impedance Value: 464 Ohm
Lead Channel Sensing Intrinsic Amplitude: 5.192 mV
Lead Channel Sensing Intrinsic Amplitude: 5.921 mV
Lead Channel Setting Pacing Amplitude: 2 V
Lead Channel Setting Pacing Amplitude: 2.5 V
Lead Channel Setting Pacing Pulse Width: 0.6 ms
Lead Channel Setting Sensing Sensitivity: 0.9 mV

## 2019-01-15 NOTE — Progress Notes (Signed)
No show letter

## 2019-01-23 ENCOUNTER — Telehealth: Payer: Self-pay

## 2019-01-23 NOTE — Telephone Encounter (Signed)
Left message to call back  

## 2019-01-23 NOTE — Telephone Encounter (Signed)
Virtual Visit Pre-Appointment Phone Call  Steps For Call:  1. Confirm consent - "In the setting of the current Covid19 crisis, you are scheduled for a (phone or video) visit with your provider on (02/04/19) at (10AM).  Just as we do with many in-office visits, in order for you to participate in this visit, we must obtain consent.  If you'd like, I can send this to your mychart (if signed up) or email for you to review.  Otherwise, I can obtain your verbal consent now.  All virtual visits are billed to your insurance company just like a normal visit would be.  By agreeing to a virtual visit, we'd like you to understand that the technology does not allow for your provider to perform an examination, and thus may limit your provider's ability to fully assess your condition.  Finally, though the technology is pretty good, we cannot assure that it will always work on either your or our end, and in the setting of a video visit, we may have to convert it to a phone-only visit.  In either situation, we cannot ensure that we have a secure connection.  Are you willing to proceed?" STAFF: Did the patient verbally acknowledge consent to telehealth visit? Document YES/NO here: YES  2. Confirm the BEST phone number to call the day of the visit by including in appointment notes  3. Give patient instructions for WebEx/MyChart download to smartphone as below or Doximity/Doxy.me if video visit (depending on what platform provider is using)  4. Advise patient to be prepared with their blood pressure, heart rate, weight, any heart rhythm information, their current medicines, and a piece of paper and pen handy for any instructions they may receive the day of their visit  5. Inform patient they will receive a phone call 15 minutes prior to their appointment time (may be from unknown caller ID) so they should be prepared to answer  6. Confirm that appointment type is correct in Epic appointment notes (VIDEO vs PHONE)     TELEPHONE CALL NOTE  Alex Maddox has been deemed a candidate for a follow-up tele-health visit to limit community exposure during the Covid-19 pandemic. I spoke with the patient via phone to ensure availability of phone/video source, confirm preferred email & phone number, and discuss instructions and expectations.  I reminded Alex Maddox to be prepared with any vital sign and/or heart rhythm information that could potentially be obtained via home monitoring, at the time of his visit. I reminded Alex Maddox to expect a phone call at the time of his visit if his visit.  Crissie Reese, CMA 01/23/2019 5:02 PM   INSTRUCTIONS FOR DOWNLOADING THE WEBEX APP TO SMARTPHONE  - If Apple, ask patient to go to [employer] and type in WebEx in the search bar. [place] [employer], the blue/green circle. If Android, go to [employer] and type in [employer] in the search bar. The app is free but as with any other app downloads, their phone may require them to verify saved payment information or Apple/Android password.  - The patient does NOT have to create an account. - On the day of the visit, the assist will walk the patient through joining the meeting with the meeting number/password.  INSTRUCTIONS FOR DOWNLOADING THE MYCHART APP TO SMARTPHONE  - The patient must first make sure to have activated MyChart and know their login information - If Apple, go to [employer] and type in  MyChart in the search bar and download the app. If Android, ask patient to go to [employer] and type in [place] in the search bar and download the app. The app is free but as with any other app downloads, their phone may require them to verify saved payment information or Apple/Android password.  - The patient will need to then log into the app with their MyChart username and password, and select [facility] as their healthcare provider to link the account. When it is time for your visit, go to the MyChart app,  find appointments, and click Begin Video Visit. Be sure to Select Allow for your device to access the Microphone and Camera for your visit. You will then be connected, and your provider will be with you shortly.  **If they have any issues connecting, or need assistance please contact MyChart service desk [phone])**  **If using a computer, in order to ensure the best quality for their visit they will need to use either of the following Internet Browsers: [employer], or Google Chrome**  IF USING DOXIMITY or DOXY.ME - The patient will receive a link just prior to their visit, either by text or email (to be determined day of appointment depending on if it's doxy.me or Doximity).     FULL LENGTH CONSENT FOR TELE-HEALTH VISIT   I hereby voluntarily request, consent and authorize [facility] and its employed or contracted physicians, physician assistants, nurse practitioners or other licensed health care professionals (the Practitioner), to provide me with telemedicine health care services (the "Services") as deemed necessary by the treating Practitioner. I acknowledge and consent to receive the Services by the Practitioner via telemedicine. I understand that the telemedicine visit will involve communicating with the Practitioner through live audiovisual communication technology and the disclosure of certain medical information by electronic transmission. I acknowledge that I have been given the opportunity to request an in-person assessment or other available alternative prior to the telemedicine visit and am voluntarily participating in the telemedicine visit.  I understand that I have the right to withhold or withdraw my consent to the use of telemedicine in the course of my care at any time, without affecting my right to future care or treatment, and that the Practitioner or I may terminate the telemedicine visit at any time. I understand that I have the right to inspect all  information obtained and/or recorded in the course of the telemedicine visit and may receive copies of available information for a reasonable fee.  I understand that some of the potential risks of receiving the Services via telemedicine include:  [place] Delay or interruption in medical evaluation due to technological equipment failure or disruption; . Information transmitted may not be sufficient (e.g. poor resolution of images) to allow for appropriate medical decision making by the Practitioner; and/or  . In rare instances, security protocols could fail, causing a breach of personal health information.  Furthermore, I acknowledge that it is my responsibility to provide information about my medical history, conditions and care that is complete and accurate to the best of my ability. I acknowledge that Practitioner's advice, recommendations, and/or decision may be based on factors not within their control, such as incomplete or inaccurate data provided by me or distortions of diagnostic images or specimens that may result from electronic transmissions. I understand that the practice of medicine is not an exact science and that Practitioner makes no warranties or guarantees regarding treatment outcomes. I acknowledge that I will receive a copy  of this consent concurrently upon execution via email to the email address I last provided but may also request a printed copy by calling the office of [facility].    I understand that my insurance will be billed for this visit.   I have read or had this consent read to me. . I understand the contents of this consent, which adequately explains the benefits and risks of the Services being provided via telemedicine.  . I have been provided ample opportunity to ask questions regarding this consent and the Services and have had my questions answered to my satisfaction. . I give my informed consent for the services to be provided through the use of telemedicine in my  medical care  By participating in this telemedicine visit I agree to the above.

## 2019-01-24 ENCOUNTER — Encounter: Payer: Self-pay | Admitting: Cardiology

## 2019-01-24 NOTE — Progress Notes (Signed)
Remote pacemaker transmission.   

## 2019-01-30 ENCOUNTER — Encounter: Payer: PPO | Admitting: Cardiovascular Disease

## 2019-01-30 DIAGNOSIS — N5201 Erectile dysfunction due to arterial insufficiency: Secondary | ICD-10-CM | POA: Diagnosis not present

## 2019-01-30 DIAGNOSIS — N3941 Urge incontinence: Secondary | ICD-10-CM | POA: Diagnosis not present

## 2019-02-01 ENCOUNTER — Telehealth: Payer: Self-pay | Admitting: Cardiovascular Disease

## 2019-02-01 NOTE — Telephone Encounter (Signed)
Home phone/ my chart/ consent/ pre reg completed °

## 2019-02-04 ENCOUNTER — Telehealth (INDEPENDENT_AMBULATORY_CARE_PROVIDER_SITE_OTHER): Payer: PPO | Admitting: Cardiovascular Disease

## 2019-02-04 ENCOUNTER — Encounter: Payer: Self-pay | Admitting: Cardiovascular Disease

## 2019-02-04 VITALS — BP 132/81 | HR 69 | Ht 72.0 in | Wt 191.0 lb

## 2019-02-04 DIAGNOSIS — R55 Syncope and collapse: Secondary | ICD-10-CM | POA: Diagnosis not present

## 2019-02-04 DIAGNOSIS — I2510 Atherosclerotic heart disease of native coronary artery without angina pectoris: Secondary | ICD-10-CM

## 2019-02-04 DIAGNOSIS — I714 Abdominal aortic aneurysm, without rupture, unspecified: Secondary | ICD-10-CM

## 2019-02-04 DIAGNOSIS — E7800 Pure hypercholesterolemia, unspecified: Secondary | ICD-10-CM

## 2019-02-04 DIAGNOSIS — I519 Heart disease, unspecified: Secondary | ICD-10-CM

## 2019-02-04 DIAGNOSIS — Z950 Presence of cardiac pacemaker: Secondary | ICD-10-CM

## 2019-02-04 DIAGNOSIS — I10 Essential (primary) hypertension: Secondary | ICD-10-CM

## 2019-02-04 DIAGNOSIS — Z7901 Long term (current) use of anticoagulants: Secondary | ICD-10-CM

## 2019-02-04 DIAGNOSIS — I480 Paroxysmal atrial fibrillation: Secondary | ICD-10-CM | POA: Diagnosis not present

## 2019-02-04 DIAGNOSIS — I712 Thoracic aortic aneurysm, without rupture: Secondary | ICD-10-CM

## 2019-02-04 DIAGNOSIS — I7121 Aneurysm of the ascending aorta, without rupture: Secondary | ICD-10-CM

## 2019-02-04 MED ORDER — METOPROLOL TARTRATE 100 MG PO TABS: ORAL_TABLET | ORAL | 0 refills | Status: DC

## 2019-02-04 NOTE — Progress Notes (Signed)
Virtual Visit via Telephone Note   This visit type was conducted due to national recommendations for restrictions regarding the COVID-19 Pandemic (e.g. social distancing) in an effort to limit this patient's exposure and mitigate transmission in our community.  Due to his co-morbid illnesses, this patient is at least at moderate risk for complications without adequate follow up.  This format is felt to be most appropriate for this patient at this time.  The patient did not have access to video technology/had technical difficulties with video requiring transitioning to audio format only (telephone).  All issues noted in this document were discussed and addressed.  No physical exam could be performed with this format.  Please refer to the patient's chart for his  consent to telehealth for [facility].   Evaluation Performed:  Follow-up visit  Date:  02/04/2019   ID:  Usman, Millett July 16, 1951, MRN [id number]  Patient Location: Home Provider Location: Home  PCP:  Redmond School, MD  Cardiologist:  Deangleo Passage Electrophysiologist:  None   Chief Complaint: Follow-up atrial fibrillation, pacemaker, history of neurocardiogenic syncope, aortic aneurysm  History of Present Illness:    EULA MAZZOLA is a 68 y.o. male with a history of tachycardia-bradycardia syndrome (sinus node dysfunction and paroxysmal atrial fibrillation), neurocardiogenic syncope, mild left ventricular systolic dysfunction without frank heart failure, coronary artery calcification on CT, mild aneurysms of the ascending aorta and abdominal aorta.  Couple of weeks ago he had transient problems with low blood pressure (down to 93/65) and felt a little dizzy, but did not have any loss of consciousness or falls.  He been working outside and might of been a little dehydrated.  He has not had any problems with exertional dyspnea or angina, orthopnea or PND, leg edema.  He is never aware of palpitations although he has very  frequent episodes of atrial fibrillation.  He has not had any falls, injuries or serious bleeding problems (he does have a history of traumatic intracranial hemorrhage).  He is compliant with chronic monitoring of his prothrombin time and almost always has INR within the desirable range.  More than anything he is currently preoccupied with bladder problems and episodes of incontinence.  He was examined by her urologist and told that his prostate was okay, although he is on finasteride.  He was prescribed Myrbetriq.  Unclear yet whether this is helping.  It has not caused any problems with excessive elevation of blood pressure.  We have not reevaluated his ascending aortic aneurysm (4.2 cm) and abdominal aortic aneurysm (2.8 cm) since April 2018  Pacemaker interrogation shows normal device function.  The battery voltage is 2.92 V (Medtronic Revo with ERI at 2.81 V).  Lead parameters are in the normal range.  He has 24% atrial pacing and less than 1% ventricular pacing.  He has a relatively high  of atrial fibrillation at 8.6%, .  During the episodes of atrial fibrillation ventricular rate control is generally good with mild RVR seen occasionally.  In addition he does have episodes of paroxysmal atrial tachycardia with 1: 1 AV conduction with rates that briefly reach 180 bpm.  He has a history of neurally mediated syncope with both cardioinhibitory and vasodepressor components and received a dual-chamber Medtronic MRI conditional permanent pacemaker in August 2012. He has a history of paroxysmal atrial fibrillation and a history of TIA at age 2. He had a traumatic right occipital intracerebral hemorrhage while on warfarin anticoagulation in November 2016. He also has systemic hypertension and hyperlipidemia,  but no significant structural cardiac illness. Incidental note of coronary artery calcification seen on CT chest. Normal nuclear stress test perfusion pattern in 2012, EF 43%; similar EF by  echocardiography. Echo images are very difficult and follow-up echo in 2015 could not quantify EF. He has not had clinical heart failure. He takes a statin for hyperlipidemia as well as ARB and carvedilol for both the arrhythmia, the HTN and the cardiomyopathy. He has ascending aortic enlargement (4.2 cm) and fusiform ectasia of the infrarenal abdominal aorta (2.82.5 cm) and CT evidence of coronary calcification.  The patient does not have symptoms concerning for COVID-19 infection (fever, chills, cough, or new shortness of breath).    Past Medical History:  Diagnosis Date   Abnormal echocardiogram    stress myoview 10/29/2010   Arthritis    " IN MY BACK "   Dyslipidemia    Dysrhythmia    Atrial fibrillation   Hypertension    Paroxysmal atrial fibrillation (HCC)    Presence of permanent cardiac pacemaker    Sinus node dysfunction (HCC)    Sleep apnea, obstructive    sleep study 10/09/2007  AHI 5.73/hr  REM AHI 11.90/hr    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   LEFT THUMB    Stroke [facility])    Syncope    2D Echocardiogram 09/27/2010 EF between 40 to 45%   Tachycardia-bradycardia ([place])    Transient ischemic attack (TIA) 06/07   Past Surgical History:  Procedure Laterality Date   COLONOSCOPY     COLONOSCOPY N/A 06/07/2017   Procedure: COLONOSCOPY;  Surgeon: Rogene Houston, MD;  Location[facility];  Service: Endoscopy;  Laterality: N/A;  9:30   COLONOSCOPY N/A 09/20/2018   Procedure: COLONOSCOPY;  Surgeon: Rogene Houston, MD;  Location: [facility];  Service: Endoscopy;  Laterality: N/A;  [place] / REPLACE / REMOVE PACEMAKER  05/2011   LOOP RECORDER IMPLANT  02/02/2011   Medtronic- CRM    PACEMAKER INSERTION  05/30/2011   Medtronic Revo   last checked 11/07/2012   POLYPECTOMY  09/20/2018   Procedure: POLYPECTOMY;  Surgeon: Rogene Houston, MD;  Location: [facility];  Service: Endoscopy;;  colon     Current  Meds  Medication Sig   amLODipine (NORVASC) 5 MG tablet TAKE ONE TABLET (5MG  TOTAL) BY MOUTH DAILY   carvedilol (COREG) 12.5 MG tablet TAKE ONE TABLET (12.5MG  TOTAL) BY MOUTH TWO TIMES DAILY WITH A MEAL   diazepam (VALIUM) 2 MG tablet Take 2 mg by mouth every 8 (eight) hours as needed for anxiety.    enoxaparin (LOVENOX) 150 MG/ML injection Inject 1 mL (150 mg total) into the skin daily.   finasteride (PROSCAR) 5 MG tablet Take 5 mg by mouth daily.   Flaxseed Oil OIL Take 1,300 mg by mouth daily.   irbesartan (AVAPRO) 300 MG tablet Take 1 tablet (300 mg total) by mouth daily.   Multiple Vitamin (MULTIVITAMIN WITH MINERALS) TABS tablet Take 1 tablet by mouth daily. Adult Multivitamin 50+   MYRBETRIQ 50 MG TB24 tablet Take 1 tablet by mouth daily.   Omega-3 Fatty Acids (FISH OIL) 1200 MG CAPS Take 1,200 mg by mouth daily.   Polyethyl Glycol-Propyl Glycol (LUBRICANT EYE DROPS) 0.4-0.3 % SOLN Place 1 drop into both eyes 3 (three) times daily as needed (for dry eyes.).   pravastatin (PRAVACHOL) 40 MG tablet Take 1 tablet (40 mg total) by mouth every evening.  Saw Palmetto 450 MG CAPS Take 900 mg by mouth daily.   warfarin (COUMADIN) 5 MG tablet TAKE ONE TABLET BY MOUTH DAILY AS DIRECTED BY COUMADIN CLINIC     Allergies:   Patient has no known allergies.   Social History   Tobacco Use   Smoking status: Never Smoker   Smokeless tobacco: Never Used  Substance Use Topics   Alcohol use: Yes    Comment: occasionally   Drug use: No     Family Hx: The patient's family history includes Heart attack (age of onset: 56) in his father; Hypertension in his brother and mother. There is no history of Colon cancer.  ROS:   Please see the history of present illness.     All other systems reviewed and are negative.   Prior CV studies:   The following studies were reviewed today:  Most recent pacemaker download and CT angiograms of the aorta  Labs/Other Tests and Data Reviewed:     EKG:  An ECG dated 06/28/2018 was personally reviewed today and demonstrated:  Sinus rhythm, left atrial abnormality, right bundle branch block  Recent Labs: 06/29/2018: ALT 36 07/04/2018: Hemoglobin 15.3; Platelets 150 07/05/2018: BUN 8; Creatinine, Ser 0.83; Magnesium 2.1; Potassium 3.3; Sodium 141   Recent Lipid Panel No results found for: CHOL, TRIG, HDL, CHOLHDL, LDLCALC, LDLDIRECT  Wt Readings from Last 3 Encounters:  02/04/19 191 lb (86.6 kg)  09/20/18 200 lb (90.7 kg)  08/25/18 200 lb (90.7 kg)     Objective:    Vital Signs:  BP 132/81    Pulse 69    Ht 6' (1.829 m)    Wt 191 lb (86.6 kg)    BMI 25.90 kg/m    VITAL SIGNS:  reviewed Unable to examine  ASSESSMENT & PLAN:    1. PAF: Rate control is not perfect but is acceptable.    Avoid excessive reduction in blood pressure with his history of vasodepressive syncope.    He is appropriately anticoagulated. CHADS Vasc 5 (TIA 2, HTN, vascular disease, LV dysfunction) 2. Warfarin: He had a head injury 2016 but has never had other serious complications.  No recent injuries or bleeding.  INR also is within target range. 3. Neurally mediated syncope: No recent events.  Avoid vasodilators and diuretics..  I think we need to be a little more liberal with his blood pressure control and I be happy if his blood pressure is in the 130-140/80-90 range.   4. PPM:  Normal device function, Carelink Q 3 months. generator change probably necessary in the next 1-2 years 5. Asc Ao aneurysm:  Relatively mild and stable.  Plan to evaluate every other year as long as diameter remains unchanged.  We will schedule him for this July, when hopefully the coronavirus restrictions will have been listed. 6. AAA: We will evaluate on the same CT angiogram as the ascending aortic aneurysm CT abdomen performed in December 2018 showed an unchanged 2.8 cm fusiform infrarenal aneurysm.  His aortic aneurysms are another reason it is important for him to stay on a  beta-blocker 7. HTN: Satisfactory control.  Avoid diuretics which have led to syncope in the past. 8. Coronary Ca: It is quite possible that he does have coronary stenoses, but he does not have angina pectoris and is taking beta-blockers and a statin.  He had a normal nuclear stress test in the past (2012).  Target LDL 70 or less 9. LV dysfunction: EF around 43% based on previous echo nuclear stress  test, but he has never had clinical manifestations of heart failure.  Carvedilol is also beneficial for this indication as well.  Clinically euvolemic, avoid diuretics.    COVID-19 Education: The signs and symptoms of COVID-19 were discussed with the patient and how to seek care for testing (follow up with PCP or arrange E-visit).  The importance of social distancing was discussed today.  Time:   Today, I have spent 20 minutes with the patient with telehealth technology discussing the above problems.     Medication Adjustments/Labs and Tests Ordered: Current medicines are reviewed at length with the patient today.  Concerns regarding medicines are outlined above.   Tests Ordered: Orders Placed This Encounter  Procedures   CT Angio Chest/Abd/Pel for Dissection W and/or W/WO   Basic metabolic panel    Medication Changes: Meds ordered this encounter  Medications   metoprolol tartrate (LOPRESSOR) 100 MG tablet    Sig: Take 1 tablet (100 mg) two hours prior to your CTA.    Dispense:  1 tablet    Refill:  0    Disposition:  Follow up 12 months  Signed, Sanda Klein, MD  02/04/2019 12:48 PM    [place]

## 2019-02-04 NOTE — Patient Instructions (Addendum)
Medication Instructions:  Your physician recommends that you continue on your current medications as directed. Please refer to the Current Medication list given to you today.  If you need a refill on your cardiac medications before your next appointment, please call your pharmacy.   Lab work: Your physician recommends that you return for lab work within 1 week of your CTA. (BMET)  If you have labs (blood work) drawn today and your tests are completely normal, you will receive your results only by: Marland Kitchen MyChart Message (if you have MyChart) OR . A paper copy in the mail If you have any lab test that is abnormal or we need to change your treatment, we will call you to review the results.  Testing/Procedures:  Please arrive at the [facility] main entrance of [facility] at xx:xx AM (30-45 minutes prior to test start time)  [facility] [place], [place] [address] [phone]  Proceed to the [facility] Radiology Department (First Floor).  Please follow these instructions carefully (unless otherwise directed):   On the Night Before the Test: . Be sure to Drink plenty of water. . Do not consume any caffeinated/decaffeinated beverages or chocolate 12 hours prior to your test. . Do not take any antihistamines 12 hours prior to your test.  On the Day of the Test: . Drink plenty of water. Do not drink any water within one hour of the test. . Do not eat any food 4 hours prior to the test. . You may take your regular medications prior to the test.  . Take Lopressor (Metoprolol) 100 mg--1 tablet two hours prior to your test       After the Test: . Drink plenty of water. . After receiving IV contrast, you may experience a mild flushed feeling. This is normal. . On occasion, you may experience a mild rash up to 24 hours after the test. This is not dangerous. If this occurs, you can take Benadryl 25 mg and increase your fluid intake. . If you experience  trouble breathing, this can be serious. If it is severe call 911 IMMEDIATELY. If it is mild, please call our office.   Follow-Up: At [facility], you and your health needs are our priority.  As part of our continuing mission to provide you with exceptional heart care, we have created designated Provider Care Teams.  These Care Teams include your primary Cardiologist (physician) and Advanced Practice Providers (APPs -  Physician Assistants and Nurse Practitioners) who all work together to provide you with the care you need, when you need it. You will need a follow up appointment in 12 months.  Please call our office 2 months in advance to schedule this appointment.  You may see Sanda Klein, MD or one of the following Advanced Practice Providers on your designated Care Team: [place], [place] . Fabian Sharp, PA-C  Any Other Special Instructions Will Be Listed Below (If Applicable). None

## 2019-02-05 ENCOUNTER — Telehealth: Payer: Self-pay | Admitting: *Deleted

## 2019-02-05 NOTE — Telephone Encounter (Signed)
° ° °  COVID-19 Pre-Screening Questions: ° °• Do you currently have a fever? No °•  °• Have you recently travelled on a cruise, internationally, or to [place], [place], [place], [place], [place], or [place], [place] ([employer]) ? No ° °• Have you been in contact with someone that is currently pending confirmation of Covid19 testing or has been confirmed to have the Covid19 virus? No °•  °Are you currently experiencing fatigue or cough? no    ° ° ° ° ° °

## 2019-02-06 ENCOUNTER — Ambulatory Visit (INDEPENDENT_AMBULATORY_CARE_PROVIDER_SITE_OTHER): Payer: PPO | Admitting: Pharmacist Clinician (PhC)/ Clinical Pharmacy Specialist

## 2019-02-06 DIAGNOSIS — Z5181 Encounter for therapeutic drug level monitoring: Secondary | ICD-10-CM

## 2019-02-06 DIAGNOSIS — I480 Paroxysmal atrial fibrillation: Secondary | ICD-10-CM

## 2019-02-06 DIAGNOSIS — I4891 Unspecified atrial fibrillation: Secondary | ICD-10-CM

## 2019-02-06 LAB — POCT INR: INR: 3 (ref 2.0–3.0)

## 2019-02-19 ENCOUNTER — Telehealth: Payer: Self-pay | Admitting: Cardiovascular Disease

## 2019-02-19 NOTE — Telephone Encounter (Signed)
Called patient, advised that CT was not scheduled yet- they would call to get that scheduled soon. But that medication should be taken 2 hours prior to the CT when it is scheduled.   Patient verbalized understanding.

## 2019-02-19 NOTE — Telephone Encounter (Addendum)
Medication ordered in error. Not needed for the CTA of chest for AAA. Pharmacy and patient notified.

## 2019-02-19 NOTE — Addendum Note (Signed)
Addended by: Edwyna Shell I on: 02/19/2019 11:11 AM   Modules accepted: Orders

## 2019-02-19 NOTE — Telephone Encounter (Signed)
New Message    Pt is calling because he said he is going to the pharmacy to pick up his medication he is suppose to take before his procedure He is wondering when the procedure is and when he is suppose to take the medication   Please call

## 2019-02-27 ENCOUNTER — Other Ambulatory Visit: Payer: Self-pay | Admitting: Cardiovascular Disease

## 2019-03-18 ENCOUNTER — Ambulatory Visit (INDEPENDENT_AMBULATORY_CARE_PROVIDER_SITE_OTHER): Payer: PPO | Admitting: *Deleted

## 2019-03-18 DIAGNOSIS — I4891 Unspecified atrial fibrillation: Secondary | ICD-10-CM | POA: Diagnosis not present

## 2019-03-18 DIAGNOSIS — Z5181 Encounter for therapeutic drug level monitoring: Secondary | ICD-10-CM

## 2019-03-18 LAB — POCT INR: INR: 2.8 (ref 2.0–3.0)

## 2019-03-18 NOTE — Patient Instructions (Signed)
Continue 1 tablet daily.  Recheck in 6 weeks 

## 2019-04-02 DIAGNOSIS — F419 Anxiety disorder, unspecified: Secondary | ICD-10-CM | POA: Diagnosis not present

## 2019-04-02 DIAGNOSIS — E663 Overweight: Secondary | ICD-10-CM | POA: Diagnosis not present

## 2019-04-02 DIAGNOSIS — Z0000 Encounter for general adult medical examination without abnormal findings: Secondary | ICD-10-CM | POA: Diagnosis not present

## 2019-04-02 DIAGNOSIS — E7849 Other hyperlipidemia: Secondary | ICD-10-CM | POA: Diagnosis not present

## 2019-04-02 DIAGNOSIS — Z6828 Body mass index (BMI) 28.0-28.9, adult: Secondary | ICD-10-CM | POA: Diagnosis not present

## 2019-04-02 DIAGNOSIS — Z1389 Encounter for screening for other disorder: Secondary | ICD-10-CM | POA: Diagnosis not present

## 2019-04-02 DIAGNOSIS — I10 Essential (primary) hypertension: Secondary | ICD-10-CM | POA: Diagnosis not present

## 2019-04-16 ENCOUNTER — Ambulatory Visit (INDEPENDENT_AMBULATORY_CARE_PROVIDER_SITE_OTHER): Payer: PPO | Admitting: *Deleted

## 2019-04-16 DIAGNOSIS — I495 Sick sinus syndrome: Secondary | ICD-10-CM | POA: Diagnosis not present

## 2019-04-17 ENCOUNTER — Telehealth: Payer: Self-pay

## 2019-04-17 NOTE — Telephone Encounter (Signed)
Spoke with patient to remind of missed remote transmission 

## 2019-04-18 ENCOUNTER — Telehealth: Payer: Self-pay | Admitting: Student

## 2019-04-18 LAB — CUP PACEART REMOTE DEVICE CHECK
Battery Voltage: 2.9 V
Brady Statistic AP VP Percent: 0.48 %
Brady Statistic AP VS Percent: 30.92 %
Brady Statistic AS VP Percent: 0.61 %
Brady Statistic AS VS Percent: 67.99 %
Brady Statistic RA Percent Paced: 28.7 %
Brady Statistic RV Percent Paced: 1.05 %
Date Time Interrogation Session: 20200709090157
Implantable Lead Implant Date: 20120820
Implantable Lead Implant Date: 20120820
Implantable Lead Location: 753859
Implantable Lead Location: 753860
Implantable Pulse Generator Implant Date: 20120820
Lead Channel Impedance Value: 448 Ohm
Lead Channel Impedance Value: 496 Ohm
Lead Channel Sensing Intrinsic Amplitude: 5.5 mV
Lead Channel Sensing Intrinsic Amplitude: 8.224 mV
Lead Channel Setting Pacing Amplitude: 2 V
Lead Channel Setting Pacing Amplitude: 2.5 V
Lead Channel Setting Pacing Pulse Width: 0.6 ms
Lead Channel Setting Sensing Sensitivity: 0.9 mV

## 2019-04-18 NOTE — Telephone Encounter (Signed)
Discussed remote transmission with patient. He is relatively asymptomatic with variable rate control. He knows to call with any symptoms.      Legrand Como [address] [place], PA-C 04/18/2019 10:51 AM

## 2019-04-27 ENCOUNTER — Encounter: Payer: Self-pay | Admitting: Cardiology

## 2019-04-27 NOTE — Progress Notes (Signed)
Remote pacemaker transmission.   

## 2019-04-29 ENCOUNTER — Other Ambulatory Visit: Payer: Self-pay

## 2019-04-29 ENCOUNTER — Ambulatory Visit (INDEPENDENT_AMBULATORY_CARE_PROVIDER_SITE_OTHER): Payer: PPO | Admitting: *Deleted

## 2019-04-29 DIAGNOSIS — I4891 Unspecified atrial fibrillation: Secondary | ICD-10-CM | POA: Diagnosis not present

## 2019-04-29 DIAGNOSIS — Z5181 Encounter for therapeutic drug level monitoring: Secondary | ICD-10-CM

## 2019-04-29 LAB — POCT INR: INR: 3.4 — AB (ref 2.0–3.0)

## 2019-04-29 NOTE — Patient Instructions (Signed)
On doxycycline 100mg  daily for dental infection.  Has 6 days left. Hold coumadin tonight then resume 1 tablet daily Eat extra greens/salads till finished with ABX.  Call if you need more Abx.  Recheck in 6 weeks

## 2019-05-07 ENCOUNTER — Other Ambulatory Visit: Payer: Self-pay

## 2019-05-07 DIAGNOSIS — I712 Thoracic aortic aneurysm, without rupture: Secondary | ICD-10-CM

## 2019-05-07 DIAGNOSIS — I10 Essential (primary) hypertension: Secondary | ICD-10-CM

## 2019-05-07 DIAGNOSIS — I7121 Aneurysm of the ascending aorta, without rupture: Secondary | ICD-10-CM

## 2019-05-07 DIAGNOSIS — I480 Paroxysmal atrial fibrillation: Secondary | ICD-10-CM

## 2019-05-07 DIAGNOSIS — I714 Abdominal aortic aneurysm, without rupture, unspecified: Secondary | ICD-10-CM

## 2019-05-17 ENCOUNTER — Other Ambulatory Visit: Payer: PPO

## 2019-05-17 DIAGNOSIS — I480 Paroxysmal atrial fibrillation: Secondary | ICD-10-CM | POA: Diagnosis not present

## 2019-05-17 DIAGNOSIS — I10 Essential (primary) hypertension: Secondary | ICD-10-CM | POA: Diagnosis not present

## 2019-05-18 LAB — BASIC METABOLIC PANEL
BUN/Creatinine Ratio: 15 (ref 10–24)
BUN: 19 mg/dL (ref 8–27)
CO2: 21 mmol/L (ref 20–29)
Calcium: 10.2 mg/dL (ref 8.6–10.2)
Chloride: 103 mmol/L (ref 96–106)
Creatinine, Ser: 1.29 mg/dL — ABNORMAL HIGH (ref 0.76–1.27)
GFR calc Af Amer: 66 mL/min/{1.73_m2} (ref >59)
GFR calc non Af Amer: 57 mL/min/{1.73_m2} — ABNORMAL LOW (ref >59)
Glucose: 91 mg/dL (ref 65–99)
Potassium: 4.4 mmol/L (ref 3.5–5.2)
Sodium: 144 mmol/L (ref 134–144)

## 2019-05-20 ENCOUNTER — Telehealth: Payer: Self-pay | Admitting: *Deleted

## 2019-05-20 NOTE — Telephone Encounter (Signed)
Follow up  ° ° °Patient is returning call.  °

## 2019-05-20 NOTE — Telephone Encounter (Signed)
Patient made aware of results and verbalized understanding.  

## 2019-05-20 NOTE — Telephone Encounter (Signed)
-----   Message from Sanda Klein, MD sent at 05/18/2019 10:59 AM EDT ----- Pre-procedure labs for CT angio. Please make sure he hydrates well before the test (drink plenty of water the day before and the morning of the test. Do not take the irbesartan on the morning of the CT, Ok to take later that day, after the scan.

## 2019-05-20 NOTE — Telephone Encounter (Signed)
Left a message for the patient to call back.  

## 2019-05-24 ENCOUNTER — Ambulatory Visit (HOSPITAL_COMMUNITY)
Admission: RE | Admit: 2019-05-24 | Discharge: 2019-05-24 | Disposition: A | Discharge status: Home / Self Care | Payer: PPO | Source: Ambulatory Visit | Attending: Cardiovascular Disease | Admitting: Cardiovascular Disease

## 2019-05-24 ENCOUNTER — Encounter (HOSPITAL_COMMUNITY): Payer: Self-pay

## 2019-05-24 ENCOUNTER — Other Ambulatory Visit: Payer: Self-pay

## 2019-05-24 DIAGNOSIS — I714 Abdominal aortic aneurysm, without rupture, unspecified: Secondary | ICD-10-CM

## 2019-05-24 DIAGNOSIS — I7121 Aneurysm of the ascending aorta, without rupture: Secondary | ICD-10-CM

## 2019-05-24 DIAGNOSIS — I712 Thoracic aortic aneurysm, without rupture: Secondary | ICD-10-CM | POA: Insufficient documentation

## 2019-05-24 DIAGNOSIS — I701 Atherosclerosis of renal artery: Secondary | ICD-10-CM | POA: Diagnosis not present

## 2019-05-24 MED ORDER — IOHEXOL 350 MG/ML SOLN
75.0000 mL | Freq: Once | INTRAVENOUS | Status: AC | PRN
  Administered 2019-05-24: 14:00:00 75 mL via INTRAVENOUS

## 2019-05-28 ENCOUNTER — Other Ambulatory Visit: Payer: Self-pay | Admitting: *Deleted

## 2019-05-28 DIAGNOSIS — I714 Abdominal aortic aneurysm, without rupture, unspecified: Secondary | ICD-10-CM

## 2019-06-10 ENCOUNTER — Other Ambulatory Visit: Payer: Self-pay

## 2019-06-10 ENCOUNTER — Ambulatory Visit (INDEPENDENT_AMBULATORY_CARE_PROVIDER_SITE_OTHER): Payer: PPO | Admitting: *Deleted

## 2019-06-10 DIAGNOSIS — Z5181 Encounter for therapeutic drug level monitoring: Secondary | ICD-10-CM

## 2019-06-10 DIAGNOSIS — I4891 Unspecified atrial fibrillation: Secondary | ICD-10-CM

## 2019-06-10 LAB — POCT INR: INR: 3.8 — AB (ref 2.0–3.0)

## 2019-06-10 NOTE — Patient Instructions (Signed)
Hold coumadin tonight then decrease dose to 1 tablet daily except 1/2 tablet on Thursdays OK to restart Multivitamin Continue greens  Recheck in 4 weeks

## 2019-07-10 ENCOUNTER — Other Ambulatory Visit: Payer: Self-pay | Admitting: Cardiovascular Disease

## 2019-07-10 DIAGNOSIS — Z23 Encounter for immunization: Secondary | ICD-10-CM | POA: Diagnosis not present

## 2019-07-10 DIAGNOSIS — Z85828 Personal history of other malignant neoplasm of skin: Secondary | ICD-10-CM | POA: Diagnosis not present

## 2019-07-10 DIAGNOSIS — L570 Actinic keratosis: Secondary | ICD-10-CM | POA: Diagnosis not present

## 2019-07-10 DIAGNOSIS — D225 Melanocytic nevi of trunk: Secondary | ICD-10-CM | POA: Diagnosis not present

## 2019-07-10 DIAGNOSIS — L821 Other seborrheic keratosis: Secondary | ICD-10-CM | POA: Diagnosis not present

## 2019-07-10 DIAGNOSIS — Z86018 Personal history of other benign neoplasm: Secondary | ICD-10-CM | POA: Diagnosis not present

## 2019-07-10 NOTE — Telephone Encounter (Signed)
Refill request

## 2019-07-11 ENCOUNTER — Ambulatory Visit (INDEPENDENT_AMBULATORY_CARE_PROVIDER_SITE_OTHER): Payer: PPO | Admitting: *Deleted

## 2019-07-11 ENCOUNTER — Other Ambulatory Visit: Payer: Self-pay

## 2019-07-11 DIAGNOSIS — I4891 Unspecified atrial fibrillation: Secondary | ICD-10-CM | POA: Diagnosis not present

## 2019-07-11 DIAGNOSIS — Z5181 Encounter for therapeutic drug level monitoring: Secondary | ICD-10-CM | POA: Diagnosis not present

## 2019-07-11 LAB — POCT INR: INR: 3.4 — AB (ref 2.0–3.0)

## 2019-07-11 NOTE — Patient Instructions (Signed)
Hold coumadin tonight then decrease dose to 1 tablet daily except 1/2 tablet on Mondays and Thursdays Continue greens  Recheck in 4 weeks

## 2019-07-15 ENCOUNTER — Telehealth: Payer: Self-pay | Admitting: Pharmacist

## 2019-07-15 NOTE — Telephone Encounter (Signed)
Called pt to discuss changing from warfarin to Eliquis. Pt is interested in changing but just picked up a 90 day supply of warfarin. He wishes to use up his warfarin before changing over. He sees Dr Sallyanne Kuster at the end of October and may discuss with him as well. Provided pt with direct # to pharmacy line if he wishes to change to Eliquis before the next 3 months are up. Otherwise, will plan to call pt to transition him to Eliquis when he runs out of warfarin in a few months.

## 2019-07-16 ENCOUNTER — Ambulatory Visit (INDEPENDENT_AMBULATORY_CARE_PROVIDER_SITE_OTHER): Payer: PPO | Admitting: *Deleted

## 2019-07-16 DIAGNOSIS — I4891 Unspecified atrial fibrillation: Secondary | ICD-10-CM

## 2019-07-16 DIAGNOSIS — R55 Syncope and collapse: Secondary | ICD-10-CM

## 2019-07-17 LAB — CUP PACEART REMOTE DEVICE CHECK
Battery Voltage: 2.9 V
Brady Statistic AP VP Percent: 0.54 %
Brady Statistic AP VS Percent: 29.02 %
Brady Statistic AS VP Percent: 0.48 %
Brady Statistic AS VS Percent: 69.96 %
Brady Statistic RA Percent Paced: 27.05 %
Brady Statistic RV Percent Paced: 0.97 %
Date Time Interrogation Session: 20201007132928
Implantable Lead Implant Date: 20120820
Implantable Lead Implant Date: 20120820
Implantable Lead Location: 753859
Implantable Lead Location: 753860
Implantable Pulse Generator Implant Date: 20120820
Lead Channel Impedance Value: 440 Ohm
Lead Channel Impedance Value: 464 Ohm
Lead Channel Sensing Intrinsic Amplitude: 5.921 mV
Lead Channel Sensing Intrinsic Amplitude: 6.028 mV
Lead Channel Setting Pacing Amplitude: 2 V
Lead Channel Setting Pacing Amplitude: 2.5 V
Lead Channel Setting Pacing Pulse Width: 0.6 ms
Lead Channel Setting Sensing Sensitivity: 0.9 mV

## 2019-07-22 DIAGNOSIS — I4891 Unspecified atrial fibrillation: Secondary | ICD-10-CM | POA: Diagnosis not present

## 2019-07-22 DIAGNOSIS — W64XXXA Exposure to other animate mechanical forces, initial encounter: Secondary | ICD-10-CM | POA: Diagnosis not present

## 2019-07-22 DIAGNOSIS — Z6828 Body mass index (BMI) 28.0-28.9, adult: Secondary | ICD-10-CM | POA: Diagnosis not present

## 2019-07-22 DIAGNOSIS — L255 Unspecified contact dermatitis due to plants, except food: Secondary | ICD-10-CM | POA: Diagnosis not present

## 2019-07-22 DIAGNOSIS — E663 Overweight: Secondary | ICD-10-CM | POA: Diagnosis not present

## 2019-07-22 DIAGNOSIS — I10 Essential (primary) hypertension: Secondary | ICD-10-CM | POA: Diagnosis not present

## 2019-07-24 ENCOUNTER — Telehealth: Payer: Self-pay | Admitting: *Deleted

## 2019-07-24 NOTE — Telephone Encounter (Signed)
Patient called he has been put on new medication "doxy"something he stated

## 2019-07-24 NOTE — Telephone Encounter (Signed)
Returning call Call [phone]

## 2019-07-24 NOTE — Telephone Encounter (Signed)
Called back and spoke with wife.  Pt was started on Doxycycline 100mg  bid for poison ivy on 07/22/19.  Told her to have pt continue same warfarin dose (INR was fine when he was taking doxy before) and INR appt move up 1 week.  She verbalized understanding.

## 2019-07-25 NOTE — Progress Notes (Signed)
Remote pacemaker transmission.   

## 2019-08-05 ENCOUNTER — Ambulatory Visit (INDEPENDENT_AMBULATORY_CARE_PROVIDER_SITE_OTHER): Payer: PPO | Admitting: *Deleted

## 2019-08-05 ENCOUNTER — Other Ambulatory Visit: Payer: Self-pay

## 2019-08-05 DIAGNOSIS — I4891 Unspecified atrial fibrillation: Secondary | ICD-10-CM

## 2019-08-05 DIAGNOSIS — Z5181 Encounter for therapeutic drug level monitoring: Secondary | ICD-10-CM

## 2019-08-05 LAB — POCT INR: INR: 2.6 (ref 2.0–3.0)

## 2019-08-05 NOTE — Patient Instructions (Signed)
Continue warfarin 1 tablet daily except 1/2 tablet on Mondays and Thursdays Continue greens  Recheck in 4 weeks

## 2019-08-09 ENCOUNTER — Other Ambulatory Visit: Payer: Self-pay

## 2019-08-09 ENCOUNTER — Ambulatory Visit: Payer: PPO | Admitting: Cardiovascular Disease

## 2019-08-09 ENCOUNTER — Telehealth: Payer: Self-pay | Admitting: Cardiovascular Disease

## 2019-08-09 ENCOUNTER — Encounter: Payer: Self-pay | Admitting: Cardiovascular Disease

## 2019-08-09 VITALS — BP 138/76 | HR 80 | Ht 72.0 in | Wt 194.2 lb

## 2019-08-09 DIAGNOSIS — I10 Essential (primary) hypertension: Secondary | ICD-10-CM | POA: Diagnosis not present

## 2019-08-09 DIAGNOSIS — I519 Heart disease, unspecified: Secondary | ICD-10-CM | POA: Diagnosis not present

## 2019-08-09 DIAGNOSIS — R55 Syncope and collapse: Secondary | ICD-10-CM | POA: Diagnosis not present

## 2019-08-09 DIAGNOSIS — I495 Sick sinus syndrome: Secondary | ICD-10-CM | POA: Diagnosis not present

## 2019-08-09 DIAGNOSIS — Z950 Presence of cardiac pacemaker: Secondary | ICD-10-CM

## 2019-08-09 DIAGNOSIS — I480 Paroxysmal atrial fibrillation: Secondary | ICD-10-CM

## 2019-08-09 DIAGNOSIS — I714 Abdominal aortic aneurysm, without rupture, unspecified: Secondary | ICD-10-CM

## 2019-08-09 DIAGNOSIS — E7800 Pure hypercholesterolemia, unspecified: Secondary | ICD-10-CM | POA: Diagnosis not present

## 2019-08-09 DIAGNOSIS — Z7901 Long term (current) use of anticoagulants: Secondary | ICD-10-CM | POA: Diagnosis not present

## 2019-08-09 DIAGNOSIS — I4891 Unspecified atrial fibrillation: Secondary | ICD-10-CM | POA: Diagnosis not present

## 2019-08-09 DIAGNOSIS — I712 Thoracic aortic aneurysm, without rupture: Secondary | ICD-10-CM

## 2019-08-09 DIAGNOSIS — I2510 Atherosclerotic heart disease of native coronary artery without angina pectoris: Secondary | ICD-10-CM

## 2019-08-09 DIAGNOSIS — I7121 Aneurysm of the ascending aorta, without rupture: Secondary | ICD-10-CM

## 2019-08-09 DIAGNOSIS — E7849 Other hyperlipidemia: Secondary | ICD-10-CM | POA: Diagnosis not present

## 2019-08-09 LAB — COMPREHENSIVE METABOLIC PANEL
ALT: 29 IU/L (ref 0–44)
AST: 21 IU/L (ref 0–40)
Albumin/Globulin Ratio: 1.7 (ref 1.2–2.2)
Albumin: 4.3 g/dL (ref 3.8–4.8)
Alkaline Phosphatase: 86 IU/L (ref 39–117)
BUN/Creatinine Ratio: 16 (ref 10–24)
BUN: 19 mg/dL (ref 8–27)
Bilirubin Total: 0.7 mg/dL (ref 0.0–1.2)
CO2: 23 mmol/L (ref 20–29)
Calcium: 10.5 mg/dL — ABNORMAL HIGH (ref 8.6–10.2)
Chloride: 101 mmol/L (ref 96–106)
Creatinine, Ser: 1.16 mg/dL (ref 0.76–1.27)
GFR calc Af Amer: 75 mL/min/{1.73_m2} (ref >59)
GFR calc non Af Amer: 65 mL/min/{1.73_m2} (ref >59)
Globulin, Total: 2.5 g/dL (ref 1.5–4.5)
Glucose: 88 mg/dL (ref 65–99)
Potassium: 3.9 mmol/L (ref 3.5–5.2)
Sodium: 142 mmol/L (ref 134–144)
Total Protein: 6.8 g/dL (ref 6.0–8.5)

## 2019-08-09 LAB — LIPID PANEL
Chol/HDL Ratio: 4 ratio (ref 0.0–5.0)
Cholesterol, Total: 155 mg/dL (ref 100–199)
HDL: 39 mg/dL — ABNORMAL LOW (ref >39)
LDL Chol Calc (NIH): 96 mg/dL (ref 0–99)
Triglycerides: 106 mg/dL (ref 0–149)
VLDL Cholesterol Cal: 20 mg/dL (ref 5–40)

## 2019-08-09 MED ORDER — CARVEDILOL 25 MG PO TABS: 25.0000 mg | ORAL_TABLET | Freq: Two times a day (BID) | ORAL | 3 refills | Status: DC

## 2019-08-09 MED ORDER — AMLODIPINE BESYLATE 2.5 MG PO TABS: 2.5000 mg | ORAL_TABLET | Freq: Every day | ORAL | 3 refills | Status: DC

## 2019-08-09 NOTE — Telephone Encounter (Signed)
Pharmacy asked if the dosage of his amlodipine and carvedilol had been changed. Reviewed Dr Lurline Del last note. Per Dr. Sallyanne Kuster: Medication Instructions:  DECREASE Amlodipine to 2.5 mg once daily INCREASE the Carvedilol to 25 mg twice daily  Pharmacist verbalized understanding and took this nurse's name.

## 2019-08-09 NOTE — Patient Instructions (Addendum)
Medication Instructions:  DECREASE Amlodipine to 2.5 mg once daily INCREASE the Carvedilol to 25 mg twice daily  *If you need a refill on your cardiac medications before your next appointment, please call your pharmacy*  Lab Work: Your provider would like for you to have the following labs today: Lipid and CMET  If you have labs (blood work) drawn today and your tests are completely normal, you will receive your results only by: Marland Kitchen MyChart Message (if you have MyChart) OR . A paper copy in the mail If you have any lab test that is abnormal or we need to change your treatment, we will call you to review the results.  Testing/Procedures: None ordered  Follow-Up: At [facility], you and your health needs are our priority.  As part of our continuing mission to provide you with exceptional heart care, we have created designated Provider Care Teams.  These Care Teams include your primary Cardiologist (physician) and Advanced Practice Providers (APPs -  Physician Assistants and Nurse Practitioners) who all work together to provide you with the care you need, when you need it.  Your next appointment:   12 months  The format for your next appointment:   In Person  Provider:   Sanda Klein, MD

## 2019-08-09 NOTE — Progress Notes (Signed)
Patient ID: Alex Maddox, male   DOB: 23-Apr-1951, 68 y.o.   MRN: [id number]    Cardiology Office Note    Date:  08/11/2019   ID:  Alex Maddox, DOB 10-01-1951, MRN [id number]  PCP:  Redmond School, MD  Cardiologist:   Sanda Klein, MD   Chief Complaint  Patient presents with  . Atrial Fibrillation  . Thoracic Aortic Aneurysm    and AAA  . Loss of Consciousness    neurally mediated syncope  . Pacemaker Check    History of Present Illness:  Alex Maddox is a 68 y.o. male returning for routine f/u for paroxysmal atrial fibrillation, decreased LVEF without clinical heart failure, thoracic and abdominal aortic aneurysm, neurally mediated syncope, tachycardia-bradycardia syndrome and pacemaker.  He feels well and denies any cardiovascular complaints.  When he checked into the office and had the ECG performed the rhythm was atrial paced, ventricular sensed.  By the time I came to examine him he was in atrial fibrillation with controlled ventricular response confirmed by pacemaker interrogation, which shows an overall burden of atrial fibrillation around 9% (increased from 3% a year ago), frequently with rapid  ventricular response.  The patient specifically denies any chest pain at rest or with exertion, dyspnea at rest or with exertion, orthopnea, paroxysmal nocturnal dyspnea, syncope, palpitations, focal neurological deficits, intermittent claudication, lower extremity edema, unexplained weight gain, cough, hemoptysis or wheezing.  Has not had any injuries or bleeding problems on chronic warfarin anticoagulation.    Pacemaker interrogation shows normal device function. Battery voltage is good at 2.93V (RRT 2.81 V).  His device is a Medtronic Revo dual-chamber pacemaker implanted in 2012. He has 28% atrial pacing and 1.1% ventricular pacing. His burden of atrial fibrillation is 9%, similar to his previous trend.  Rate control is mediocre.  The average ventricular rate is almost never over  110 bpm.     He has a history of neurally mediated syncope with both cardioinhibitory and vasodepressor components and received a dual-chamber Medtronic MRI conditional permanent pacemaker in August 2012. He has a history of paroxysmal atrial fibrillation and a history of TIA at age 36. He had a traumatic right occipital intracerebral hemorrhage while on warfarin anticoagulation in November 2016. He also has thoracic ascending aortic aneurysm, infrarenal AAA, systemic hypertension and hyperlipidemia. Normal nuclear stress test perfusion pattern in 2012, EF 43%; similar EF by echocardiography. Echo images are very difficult and follow-up echo in 2015 could not quantify EF. He has never had clinical heart failure. He takes a statin for hyperlipidemia as well as valsartan and carvedilol for both the arrhythmia, the HTN and the cardiomyopathy. He has ascending aortic enlargement (4.2 cm) and fusiform ectasia of the infrarenal abdominal aorta (max diam 3.0 cm) and CT evidence of coronary calcification.  Past Medical History:  Diagnosis Date  . Abnormal echocardiogram    stress myoview 10/29/2010  . Arthritis    " IN MY BACK "  . Dyslipidemia   . Dysrhythmia    Atrial fibrillation  . Hypertension   . Paroxysmal atrial fibrillation (HCC)   . Presence of permanent cardiac pacemaker   . Sinus node dysfunction (HCC)   . Sleep apnea, obstructive    sleep study 10/09/2007  AHI 5.73/hr  REM AHI 11.90/hr   . 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   LEFT THUMB   . Stroke ([place])   . Syncope    2D Echocardiogram 09/27/2010 EF between 40 to 45%  .  Tachycardia-bradycardia ([place])   . Transient ischemic attack (TIA) 06/07    Past Surgical History:  Procedure Laterality Date  . COLONOSCOPY    . COLONOSCOPY N/A 06/07/2017   Procedure: COLONOSCOPY;  Surgeon: Rogene Houston, MD;  [facility];  Service: Endoscopy;  Laterality: N/A;  9:30  . COLONOSCOPY N/A 09/20/2018   Procedure:  COLONOSCOPY;  Surgeon: Rogene Houston, MD;  Location: [facility];  Service: Endoscopy;  Laterality: N/A;  1030  . ELECTROCARDIOGRAM    . INSERT / REPLACE / REMOVE PACEMAKER  05/2011  . LOOP RECORDER IMPLANT  02/02/2011   Medtronic- CRM   . PACEMAKER INSERTION  05/30/2011   Medtronic Revo   last checked 11/07/2012  . POLYPECTOMY  09/20/2018   Procedure: POLYPECTOMY;  Surgeon: Rogene Houston, MD;  Location: [facility];  Service: Endoscopy;;  colon    Outpatient Medications Prior to Visit  Medication Sig Dispense Refill  . diazepam (VALIUM) 2 MG tablet Take 2 mg by mouth every 8 (eight) hours as needed for anxiety.     . finasteride (PROSCAR) 5 MG tablet Take 5 mg by mouth daily.    . Flaxseed Oil OIL Take 1,300 mg by mouth daily.    . irbesartan (AVAPRO) 300 MG tablet TAKE ONE (1) TABLET BY MOUTH EVERY DAY 90 tablet 2  . Multiple Vitamin (MULTIVITAMIN WITH MINERALS) TABS tablet Take 1 tablet by mouth daily. Adult Multivitamin 50+    . MYRBETRIQ 50 MG TB24 tablet Take 1 tablet by mouth daily.    . Omega-3 Fatty Acids (FISH OIL) 1200 MG CAPS Take 1,200 mg by mouth daily.    . pravastatin (PRAVACHOL) 40 MG tablet Take 1 tablet (40 mg total) by mouth every evening. 30 tablet 0  . Saw Palmetto 450 MG CAPS Take 900 mg by mouth daily.    Marland Kitchen warfarin (COUMADIN) 5 MG tablet TAKE ONE (1) TABLET BY MOUTH EVERY DAY AS DIRECTED BY COUMADIN CLINIC 90 tablet 1  . amLODipine (NORVASC) 5 MG tablet TAKE ONE TABLET (5MG  TOTAL) BY MOUTH DAILY 90 tablet 3  . carvedilol (COREG) 12.5 MG tablet TAKE ONE TABLET (12.5MG  TOTAL) BY MOUTH TWO TIMES DAILY WITH A MEAL 180 tablet 2  . enoxaparin (LOVENOX) 150 MG/ML injection Inject 1 mL (150 mg total) into the skin daily. 10 mL 0  . Polyethyl Glycol-Propyl Glycol (LUBRICANT EYE DROPS) 0.4-0.3 % SOLN Place 1 drop into both eyes 3 (three) times daily as needed (for dry eyes.).     No facility-administered medications prior to visit.      Allergies:   Patient has  no known allergies.   Social History   Socioeconomic History  . Marital status: Married    Spouse name: Not on file  . Number of children: 0  . Years of education: Not on file  . Highest education level: Not on file  Occupational History  . Not on file  Social Needs  . Financial resource strain: Not on file  . Food insecurity    Worry: Not on file    Inability: Not on file  . Transportation needs    Medical: Not on file    Non-medical: Not on file  Tobacco Use  . Smoking status: Never Smoker  . Smokeless tobacco: Never Used  Substance and Sexual Activity  . Alcohol use: Yes    Comment: occasionally  . Drug use: No  . Sexual activity: Not on file  Lifestyle  . Physical activity  Days per week: Not on file    Minutes per session: Not on file  . Stress: Not on file  Relationships  . Social Herbalist on phone: Not on file    Gets together: Not on file    Attends religious service: Not on file    Active member of club or organization: Not on file    Attends meetings of clubs or organizations: Not on file    Relationship status: Not on file  Other Topics Concern  . 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     Family History:  The patient's family history includes Heart attack (age of onset: 42) in his father; Hypertension in his brother and mother.   ROS:   Please see the history of present illness.    ROS All other systems are reviewed and are negative.   PHYSICAL EXAM:   VS:  BP 138/76   Pulse 80   Ht 6' (1.829 m)   Wt 194 lb 3.2 oz (88.1 kg)   BMI 26.34 kg/m      General: Alert, oriented x3, no distress, healthy left subclavian pacemaker site Head: no evidence of trauma, PERRL, EOMI, no exophtalmos or lid lag, no myxedema, no xanthelasma; normal ears, nose and oropharynx Neck: normal jugular venous pulsations and no hepatojugular reflux; brisk carotid pulses without delay and no carotid bruits  Chest: clear to auscultation, no signs of consolidation by percussion or palpation, normal fremitus, symmetrical and full respiratory excursions Cardiovascular: normal position and quality of the apical impulse, irregular rhythm, normal first and second heart sounds, no murmurs, rubs or gallops Abdomen: no tenderness or distention, no masses by palpation, no abnormal pulsatility or arterial bruits, normal bowel sounds, no hepatosplenomegaly Extremities: no clubbing, cyanosis or edema; 2+ radial, ulnar and brachial pulses bilaterally; 2+ right femoral, posterior tibial and dorsalis pedis pulses; 2+ left femoral, posterior tibial and dorsalis pedis pulses; no subclavian or femoral bruits Neurological: grossly nonfocal Psych: Normal mood and affect   Wt Readings from Last 3 Encounters:  08/09/19 194 lb 3.2 oz (88.1 kg)  02/04/19 191 lb (86.6 kg)  09/20/18 200 lb (90.7 kg)      Studies/Labs Reviewed:   EKG:  EKG is not ordered today.   CTA 05/24/2019: 1. Stable mild aneurysmal disease of the ascending thoracic aorta with maximal diameter of approximately 4.2 cm. 2. Stable coronary atherosclerosis. 3. Stable 5 mm right upper lobe and 6 mm right middle lobe pulmonary nodules. 4. Stable to minimally larger ectasia/mild aneurysmal disease of the distal abdominal aorta measuring 2.8 x 3.0 cm. 5. Non critical and stable appearing roughly 40% stenosis of the proximal SMA trunk and 40-50% stenosis of the proximal right renal artery. 6. Small left inguinal hernia containing fat.  BMET    Component Value Date/Time   NA 142 08/09/2019 0912   K 3.9 08/09/2019 0912   CL 101 08/09/2019 0912   CO2 23 08/09/2019 0912   GLUCOSE 88 08/09/2019 0912   GLUCOSE 88 07/05/2018 0533   BUN 19 08/09/2019 0912   CREATININE 1.16 08/09/2019 0912   CREATININE 1.19 01/16/2017 0846   CALCIUM 10.5 (H) 08/09/2019 0912   GFRNONAA 65 08/09/2019 0912   GFRAA 75 08/09/2019 0912   Lipid Panel     Component  Value Date/Time   CHOL 155 08/09/2019 0912   TRIG 106 08/09/2019 0912   HDL 39 (L) 08/09/2019 0912   CHOLHDL  4.0 08/09/2019 0912   LDLCALC 96 08/09/2019 0912   LABVLDL 20 08/09/2019 0912     ASSESSMENT:    1. Paroxysmal atrial fibrillation (HCC)   2. Pure hypercholesterolemia   3. Tachycardia-bradycardia syndrome ([place])   4. Long term (current) use of anticoagulants   5. Neurocardiogenic syncope   6. Pacemaker   7. Ascending aortic aneurysm ([place])   8. AAA (abdominal aortic aneurysm) without rupture ([place])   9. Essential hypertension   10. Coronary artery calcification seen on CT scan   11. Asymptomatic left ventricular systolic dysfunction      PLAN:  In order of problems listed above:  1. PAF: Rate control needs to be improved, especially with abnormal LVEF. Increase carvedilol, decrease amlodipine. Need to avoid excessive blood pressure reduction due to his history of vasodepressive syncope.  He is appropriately anticoagulated. CHADS Vasc 5 (TIA 2, HTN, vascular disease, LV dysfunction). 2. Tachy-brady sd: infrequent need for pacing (mostly post-arrhythmic pauses). 3. Warfarin: He had a head injury 2016 but has never had other serious complications.  He has not had a stroke, but had a remote TIA. 4. Neurally mediated syncope: None recently. Reminded him of how to avoid the triggers, reduce the likelihood of events and blood to do the prodromal symptoms occur.   I think we need to be a little more liberal with his blood pressure control and I be happy if his blood pressure is in the 130-140/80-90 range, even acknowledging abnormal EF.   5. PPM:  Normal device function, Carelink Q 3 mos. Yearly office check. 6. Asc Ao aneurysm: CT angiogram performed in August 2020 showed a stable aneurysm size at 4.2 cm (unchanged since 2010). Continue to monitor with CT every other year.  Asymptomatic. 7. AAA: CT abdomen performed in August 2020 shows a slight increase from 2.8 cm to 3.0 cm  fusiform infrarenal aneurysm.  His aortic aneurysms are another reason it is important for him to stay on a beta-blocker 8. HTN: need to avoid perfect control to lessen the risk of syncope.  Continue to avoid diuretics and other agents that can increase risk of syncope (stopping diuretics had a positive impact on syncope prevention).  9. Coronary Ca: He does not have angina and had a normal nuclear stress test in the past (2012). On statin. 10. LV dysfunction: Euvolemic, NYHA class I without diuretics. EF around 43% based on previous echo nuclear stress test, but he has never had clinical manifestations of heart failure. 11. HLP: target LDL<70. Labs rechecked today show LDL 96. Switch to rosuvastatin.  Medication Adjustments/Labs and Tests Ordered: Current medicines are reviewed at length with the patient today.  Concerns regarding medicines are outlined above.  Medication changes, Labs and Tests ordered today are listed in the Patient Instructions below. Patient Instructions  Medication Instructions:  DECREASE Amlodipine to 2.5 mg once daily INCREASE the Carvedilol to 25 mg twice daily  *If you need a refill on your cardiac medications before your next appointment, please call your pharmacy*  Lab Work: Your provider would like for you to have the following labs today: Lipid and CMET  If you have labs (blood work) drawn today and your tests are completely normal, you will receive your results only by: Marland Kitchen MyChart Message (if you have MyChart) OR . A paper copy in the mail If you have any lab test that is abnormal or we need to change your treatment, we will call you to review the results.  Testing/Procedures: None ordered  Follow-Up: At [facility], you and your health needs are our priority.  As part of our continuing mission to provide you with exceptional heart care, we have created designated Provider Care Teams.  These Care Teams include your primary Cardiologist (physician) and  Advanced Practice Providers (APPs -  Physician Assistants and Nurse Practitioners) who all work together to provide you with the care you need, when you need it.  Your next appointment:   12 months  The format for your next appointment:   In Person  Provider:   Sanda Klein, MD         Signed, Sanda Klein, MD  08/11/2019 9:59 AM    [facility] [address], [place], [place]  [address] Phone: [phone]; Fax: [phone]

## 2019-08-09 NOTE — Telephone Encounter (Signed)
New message   amLODipine (NORVASC) 2.5 MG tablet and carvedilol (COREG) 25 MG tablet per Esmeralda there is a discrepancy in the dosage. Please call to discuss.

## 2019-08-13 ENCOUNTER — Telehealth: Payer: Self-pay | Admitting: *Deleted

## 2019-08-13 DIAGNOSIS — I10 Essential (primary) hypertension: Secondary | ICD-10-CM

## 2019-08-13 DIAGNOSIS — E7800 Pure hypercholesterolemia, unspecified: Secondary | ICD-10-CM

## 2019-08-13 MED ORDER — ROSUVASTATIN CALCIUM 20 MG PO TABS: 20.0000 mg | ORAL_TABLET | Freq: Every day | ORAL | 3 refills | Status: DC

## 2019-08-13 NOTE — Telephone Encounter (Signed)
Patient made aware of results and verbalized understanding.  Calcium is borderline high.  Lipids are fair, but LDL is not at target (<70).  Everything else is great.  Would like to switch from pravastatin to rosuvastatin 20 mg daily and recheck lipids and BMET in 3 months, please.   The patient will stop the pravastatin and start rosuvastatin 20 mg once daily. Lab orders have been placed.

## 2019-08-20 ENCOUNTER — Other Ambulatory Visit: Payer: Self-pay

## 2019-08-26 NOTE — Telephone Encounter (Signed)
Called pt to follow up, he prefers to stay on warfarin due to Eliquis copay.

## 2019-09-02 ENCOUNTER — Other Ambulatory Visit: Payer: Self-pay

## 2019-09-02 ENCOUNTER — Ambulatory Visit (INDEPENDENT_AMBULATORY_CARE_PROVIDER_SITE_OTHER): Payer: PPO | Admitting: *Deleted

## 2019-09-02 DIAGNOSIS — I4891 Unspecified atrial fibrillation: Secondary | ICD-10-CM

## 2019-09-02 DIAGNOSIS — Z5181 Encounter for therapeutic drug level monitoring: Secondary | ICD-10-CM

## 2019-09-02 LAB — POCT INR: INR: 3.1 — AB (ref 2.0–3.0)

## 2019-09-02 NOTE — Patient Instructions (Addendum)
Hold warfarin tonight then resume 1 tablet daily except 1/2 tablet on Mondays and Thursdays Continue greens  Recheck in 5 weeks Does not want to change to Eliquis due to cost

## 2019-10-07 ENCOUNTER — Ambulatory Visit (INDEPENDENT_AMBULATORY_CARE_PROVIDER_SITE_OTHER): Payer: PPO | Admitting: *Deleted

## 2019-10-07 ENCOUNTER — Other Ambulatory Visit: Payer: Self-pay

## 2019-10-07 DIAGNOSIS — Z5181 Encounter for therapeutic drug level monitoring: Secondary | ICD-10-CM | POA: Diagnosis not present

## 2019-10-07 DIAGNOSIS — I4891 Unspecified atrial fibrillation: Secondary | ICD-10-CM | POA: Diagnosis not present

## 2019-10-07 LAB — POCT INR: INR: 2.5 (ref 2.0–3.0)

## 2019-10-07 NOTE — Patient Instructions (Signed)
Continue 1 tablet daily except 1/2 tablet on Mondays and Thursdays Continue greens  Recheck in 6 weeks Does not want to change to Eliquis due to cost 

## 2019-10-15 ENCOUNTER — Ambulatory Visit (INDEPENDENT_AMBULATORY_CARE_PROVIDER_SITE_OTHER): Payer: PPO | Admitting: *Deleted

## 2019-10-15 DIAGNOSIS — I480 Paroxysmal atrial fibrillation: Secondary | ICD-10-CM

## 2019-10-16 ENCOUNTER — Telehealth: Payer: Self-pay

## 2019-10-16 NOTE — Telephone Encounter (Signed)
Left message for patient to remind of missed remote transmission.  

## 2019-10-17 LAB — CUP PACEART REMOTE DEVICE CHECK
Battery Voltage: 2.89 V
Brady Statistic AP VP Percent: 1.51 %
Brady Statistic AP VS Percent: 46.86 %
Brady Statistic AS VP Percent: 1.13 %
Brady Statistic AS VS Percent: 50.5 %
Brady Statistic RA Percent Paced: 44.09 %
Brady Statistic RV Percent Paced: 2.53 %
Date Time Interrogation Session: 20210107003713
Implantable Lead Implant Date: 20120820
Implantable Lead Implant Date: 20120820
Implantable Lead Location: 753859
Implantable Lead Location: 753860
Implantable Pulse Generator Implant Date: 20120820
Lead Channel Impedance Value: 432 Ohm
Lead Channel Impedance Value: 448 Ohm
Lead Channel Sensing Intrinsic Amplitude: 6.028 mV
Lead Channel Sensing Intrinsic Amplitude: 8.553 mV
Lead Channel Setting Pacing Amplitude: 2 V
Lead Channel Setting Pacing Amplitude: 2.5 V
Lead Channel Setting Pacing Pulse Width: 0.6 ms
Lead Channel Setting Sensing Sensitivity: 0.9 mV

## 2019-10-18 IMAGING — CT CT HEAD W/O CM
3 series · 15 of 47 positions shown, 18 images · non-contrast
Comparison: 09/12/2017

CLINICAL DATA: Headache and altered mental status. Fever of 103.
normal neurological examination.

EXAM:
CT HEAD WITHOUT CONTRAST
TECHNIQUE: Contiguous axial images were obtained from the base of the skull
through the vertex without intravenous contrast.

[Series 2: head trauma wo · axial · 0.45mm/px · z∈[+1755,+1880]mm · 9 of 31 slices shown, 12 images]
[im 3/31  brain]
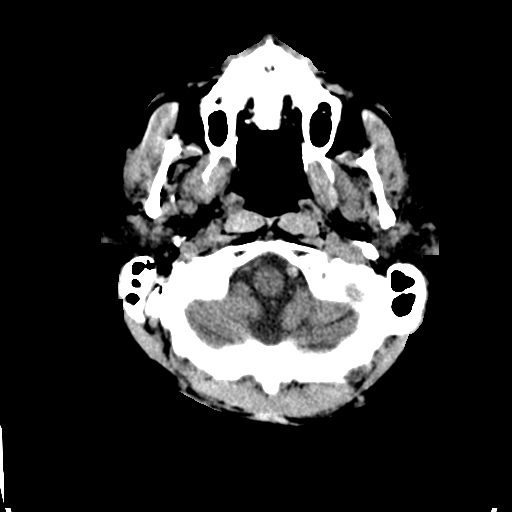
[im 3/31  bone]
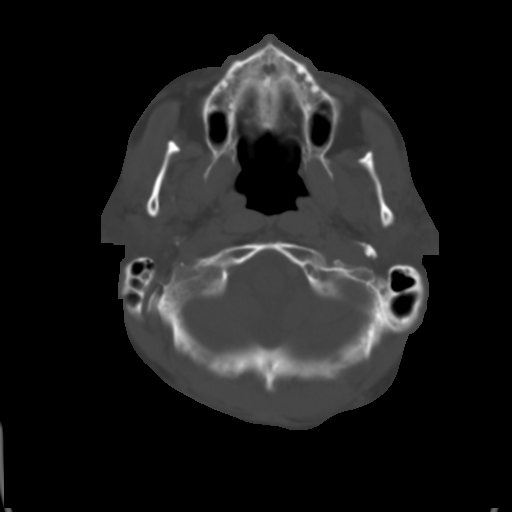
[im 6/31  brain]
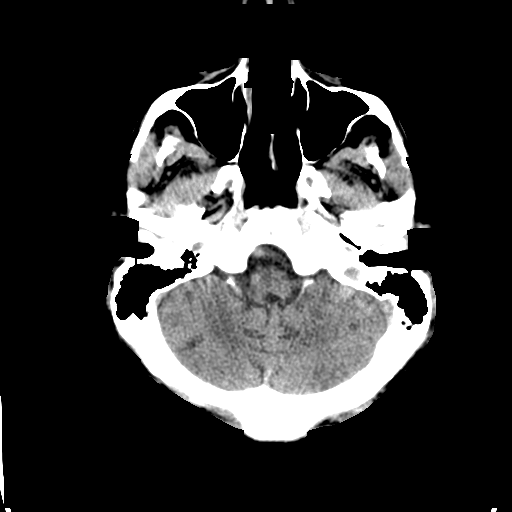
[im 9/31  brain]
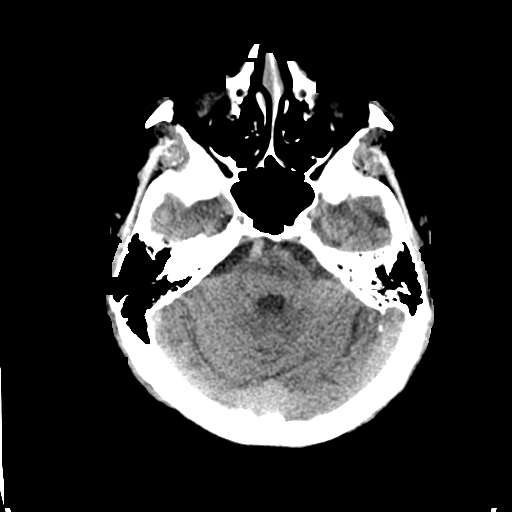
[im 12/31  brain]
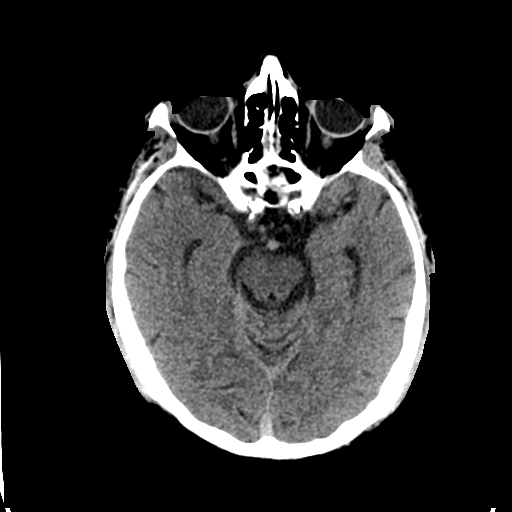
[im 16/31  brain]
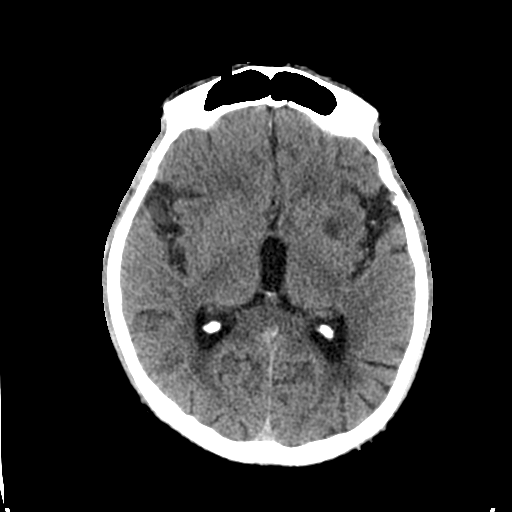
[im 16/31  bone]
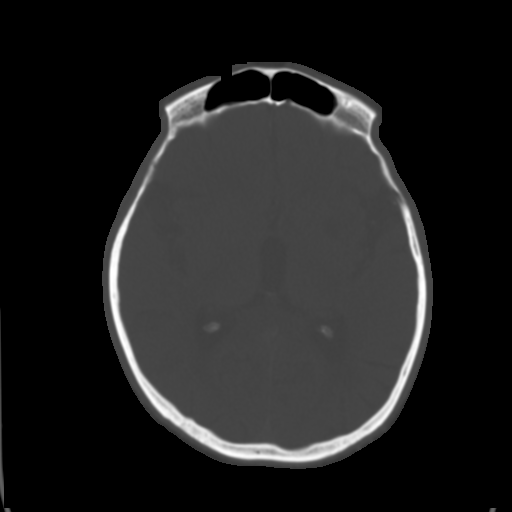
[im 19/31  brain]
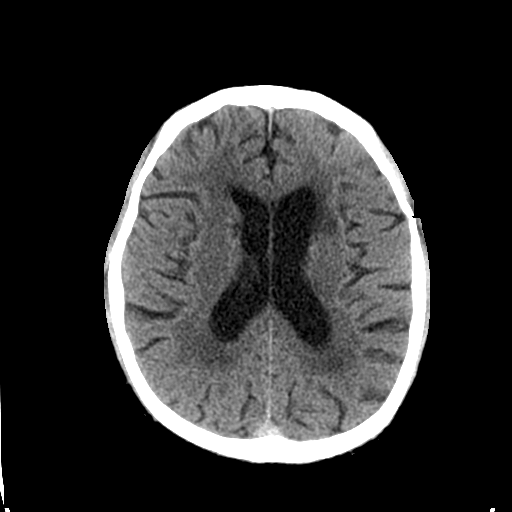
[im 22/31  brain]
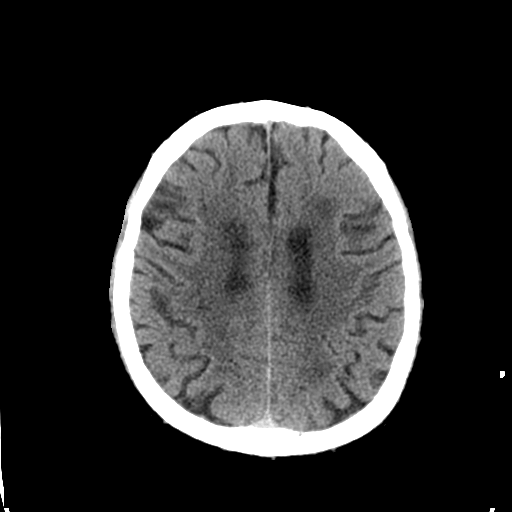
[im 25/31  brain]
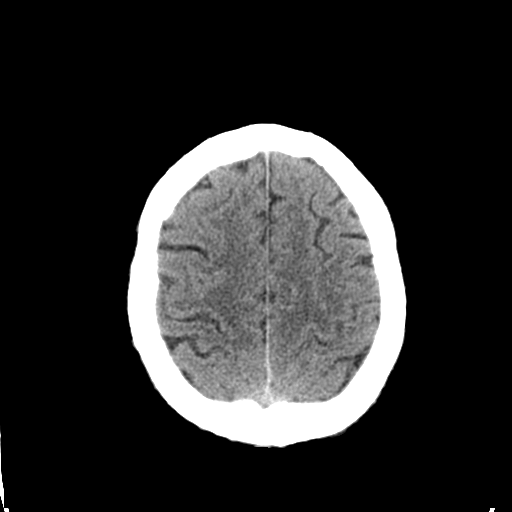
[im 28/31  brain]
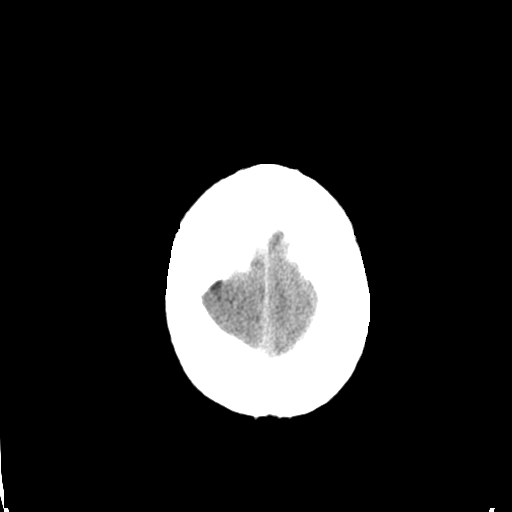
[im 28/31  bone]
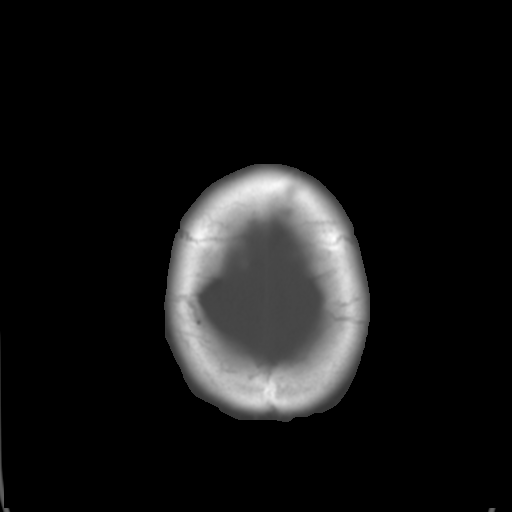

[Series 4: coronal soft tissue · coronal · 0.32mm/px · 3 of 69 slices shown]
[im 23/69  brain]
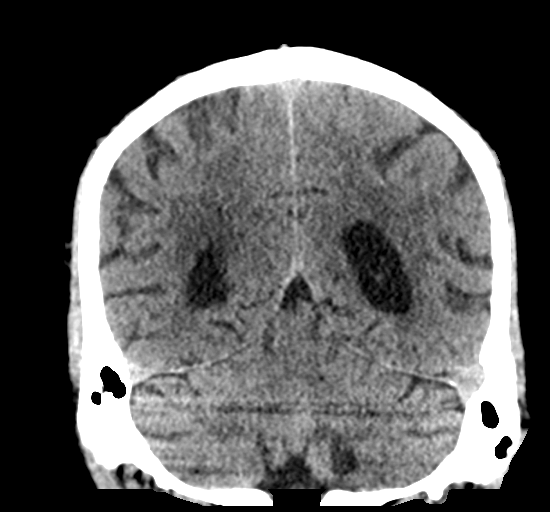
[im 31/69  brain]
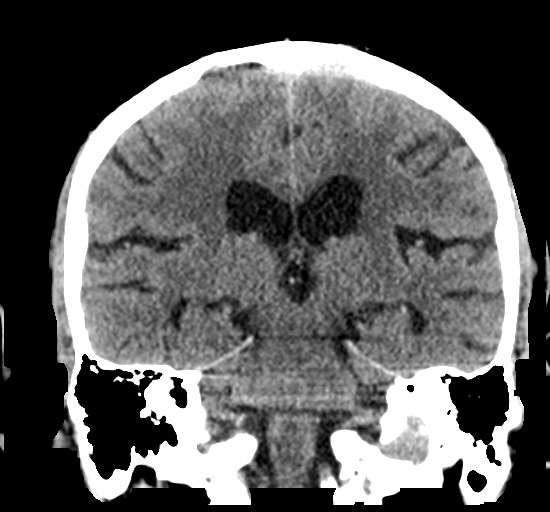
[im 38/69  brain]
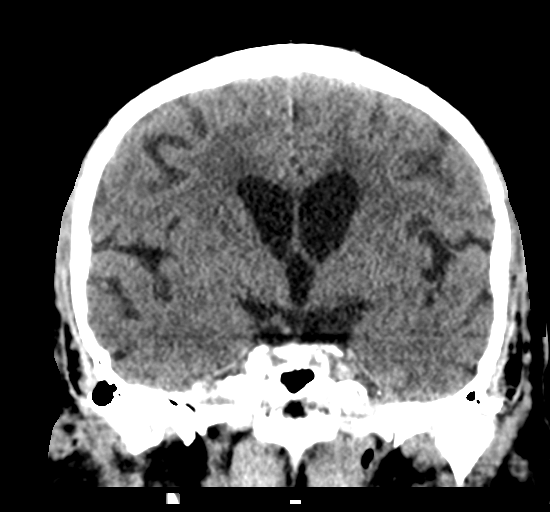

[Series 5: sagittal soft tissue · sagittal · 0.35mm/px · 3 of 54 slices shown]
[im 18/54  brain]
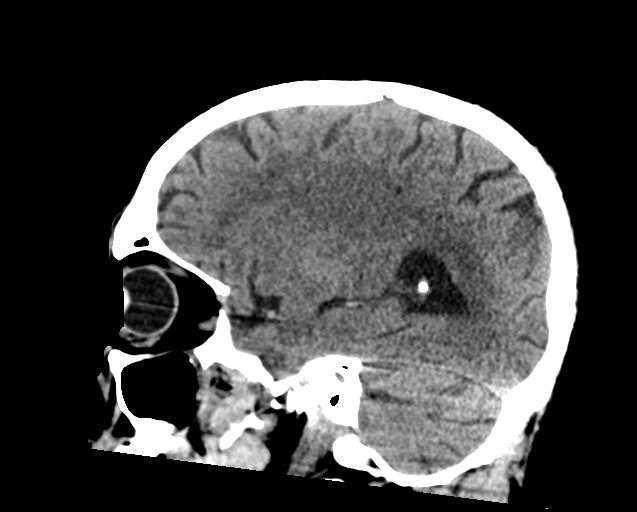
[im 27/54  brain]
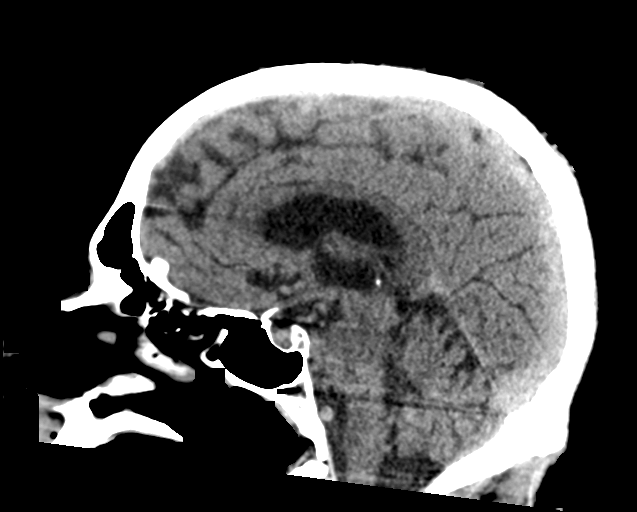
[im 36/54  brain]
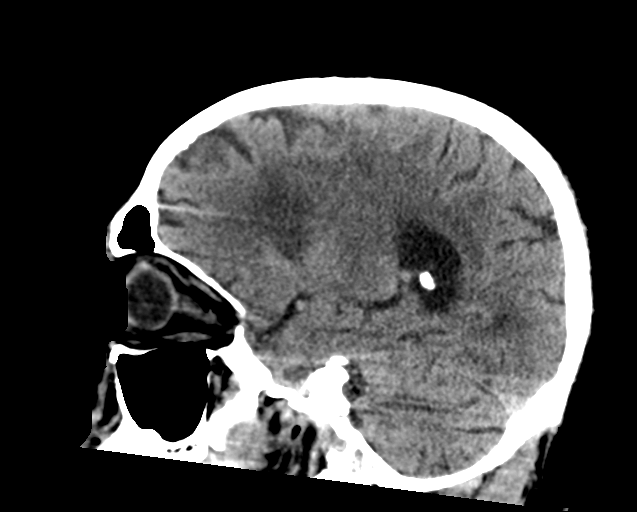

[15 of 47 positions shown; findings below may reference images not displayed]

FINDINGS: Brain: Diffuse cerebral atrophy. Mild ventricular dilatation
consistent with central atrophy. Low-attenuation changes in the deep
white matter consistent small vessel ischemia. Since the previous
study, there is interval development of asymmetric low-attenuation
change in the left internal capsule region. This could indicate
acute or subacute ischemia. Consider MRI for further evaluation if
clinically suspicious. No mass-effect or midline shift. No abnormal
extra-axial fluid collections. Gray-white matter junctions are
distinct. Basal cisterns are not effaced. No acute intracranial
hemorrhage.

Vascular: Moderate intracranial arterial vascular calcifications are
present.

Skull: Focal thinning of the calvarium in the left temporal region,
unchanged since previous study. Significance is uncertain. No
expansile or destructive bone lesions are identified. No depressed
skull fractures.

Sinuses/Orbits: Paranasal sinuses and mastoid air cells are clear.

Other: None.
IMPRESSION: 1. Interval development of asymmetric low-attenuation change in the
left internal capsule region. This could indicate acute or subacute
ischemia. Consider MRI for further evaluation if clinically
suspicious.
2. No acute intracranial hemorrhage or mass effect.
3. Chronic atrophy and small vessel ischemic changes.

## 2019-11-18 ENCOUNTER — Ambulatory Visit (INDEPENDENT_AMBULATORY_CARE_PROVIDER_SITE_OTHER): Payer: Medicare Other | Admitting: *Deleted

## 2019-11-18 ENCOUNTER — Other Ambulatory Visit: Payer: Self-pay

## 2019-11-18 DIAGNOSIS — Z5181 Encounter for therapeutic drug level monitoring: Secondary | ICD-10-CM | POA: Diagnosis not present

## 2019-11-18 DIAGNOSIS — I4891 Unspecified atrial fibrillation: Secondary | ICD-10-CM | POA: Diagnosis not present

## 2019-11-18 LAB — POCT INR: INR: 2.5 (ref 2.0–3.0)

## 2019-11-18 NOTE — Patient Instructions (Signed)
Continue 1 tablet daily except 1/2 tablet on Mondays and Thursdays Continue greens  Recheck in 6 weeks Does not want to change to Eliquis due to cost 

## 2019-11-27 ENCOUNTER — Other Ambulatory Visit: Payer: Self-pay | Admitting: Cardiovascular Disease

## 2019-12-27 DIAGNOSIS — E782 Mixed hyperlipidemia: Secondary | ICD-10-CM | POA: Diagnosis not present

## 2019-12-27 DIAGNOSIS — I4891 Unspecified atrial fibrillation: Secondary | ICD-10-CM | POA: Diagnosis not present

## 2019-12-27 DIAGNOSIS — Z950 Presence of cardiac pacemaker: Secondary | ICD-10-CM | POA: Diagnosis not present

## 2019-12-27 DIAGNOSIS — I10 Essential (primary) hypertension: Secondary | ICD-10-CM | POA: Diagnosis not present

## 2019-12-27 DIAGNOSIS — E7849 Other hyperlipidemia: Secondary | ICD-10-CM | POA: Diagnosis not present

## 2019-12-30 ENCOUNTER — Ambulatory Visit (INDEPENDENT_AMBULATORY_CARE_PROVIDER_SITE_OTHER): Payer: Medicare Other | Admitting: *Deleted

## 2019-12-30 ENCOUNTER — Other Ambulatory Visit: Payer: Self-pay

## 2019-12-30 DIAGNOSIS — I4891 Unspecified atrial fibrillation: Secondary | ICD-10-CM

## 2019-12-30 DIAGNOSIS — Z5181 Encounter for therapeutic drug level monitoring: Secondary | ICD-10-CM

## 2019-12-30 LAB — POCT INR: INR: 2.4 (ref 2.0–3.0)

## 2019-12-30 NOTE — Patient Instructions (Signed)
Continue 1 tablet daily except 1/2 tablet on Mondays and Thursdays Continue greens  Recheck in 6 weeks Does not want to change to Eliquis due to cost 

## 2020-01-08 DIAGNOSIS — E7849 Other hyperlipidemia: Secondary | ICD-10-CM | POA: Diagnosis not present

## 2020-01-08 DIAGNOSIS — I10 Essential (primary) hypertension: Secondary | ICD-10-CM | POA: Diagnosis not present

## 2020-01-08 DIAGNOSIS — I480 Paroxysmal atrial fibrillation: Secondary | ICD-10-CM | POA: Diagnosis not present

## 2020-01-14 ENCOUNTER — Ambulatory Visit (INDEPENDENT_AMBULATORY_CARE_PROVIDER_SITE_OTHER): Payer: Medicare Other | Admitting: *Deleted

## 2020-01-14 DIAGNOSIS — I480 Paroxysmal atrial fibrillation: Secondary | ICD-10-CM | POA: Diagnosis not present

## 2020-01-15 LAB — CUP PACEART REMOTE DEVICE CHECK
Battery Voltage: 2.86 V
Brady Statistic AP VP Percent: 2.59 %
Brady Statistic AP VS Percent: 47.04 %
Brady Statistic AS VP Percent: 0.99 %
Brady Statistic AS VS Percent: 49.38 %
Brady Statistic RA Percent Paced: 44.74 %
Brady Statistic RV Percent Paced: 3.4 %
Date Time Interrogation Session: 20210407095918
Implantable Lead Implant Date: 20120820
Implantable Lead Implant Date: 20120820
Implantable Lead Location: 753859
Implantable Lead Location: 753860
Implantable Pulse Generator Implant Date: 20120820
Lead Channel Impedance Value: 392 Ohm
Lead Channel Impedance Value: 464 Ohm
Lead Channel Sensing Intrinsic Amplitude: 3.212 mV
Lead Channel Sensing Intrinsic Amplitude: 6.25 mV
Lead Channel Setting Pacing Amplitude: 2 V
Lead Channel Setting Pacing Amplitude: 2.5 V
Lead Channel Setting Pacing Pulse Width: 0.6 ms
Lead Channel Setting Sensing Sensitivity: 0.9 mV

## 2020-02-05 DIAGNOSIS — R3912 Poor urinary stream: Secondary | ICD-10-CM | POA: Diagnosis not present

## 2020-02-10 ENCOUNTER — Ambulatory Visit (INDEPENDENT_AMBULATORY_CARE_PROVIDER_SITE_OTHER): Payer: Medicare Other | Admitting: *Deleted

## 2020-02-10 ENCOUNTER — Other Ambulatory Visit: Payer: Self-pay | Admitting: Cardiovascular Disease

## 2020-02-10 ENCOUNTER — Other Ambulatory Visit: Payer: Self-pay

## 2020-02-10 DIAGNOSIS — I4891 Unspecified atrial fibrillation: Secondary | ICD-10-CM

## 2020-02-10 DIAGNOSIS — Z5181 Encounter for therapeutic drug level monitoring: Secondary | ICD-10-CM

## 2020-02-10 LAB — POCT INR: INR: 2.6 (ref 2.0–3.0)

## 2020-02-10 NOTE — Patient Instructions (Signed)
Continue 1 tablet daily except 1/2 tablet on Mondays and Thursdays Continue greens  Recheck in 6 weeks Does not want to change to Eliquis due to cost 

## 2020-02-14 ENCOUNTER — Telehealth: Payer: Self-pay | Admitting: Cardiovascular Disease

## 2020-02-14 NOTE — Telephone Encounter (Signed)
The patient has been advised that it is fine to bring his wife with him .

## 2020-02-14 NOTE — Telephone Encounter (Signed)
Patient is requesting for his wife to come with him to his appt on 02/17/20 with Dr. Sallyanne Kuster. He states that they are going to listen to his heart and he would like her to be there. Denies any medical condition as a reason for his wife to accompany.

## 2020-02-17 ENCOUNTER — Other Ambulatory Visit: Payer: Self-pay

## 2020-02-17 ENCOUNTER — Ambulatory Visit (INDEPENDENT_AMBULATORY_CARE_PROVIDER_SITE_OTHER): Payer: Medicare Other | Admitting: Cardiovascular Disease

## 2020-02-17 ENCOUNTER — Encounter: Payer: Self-pay | Admitting: Cardiovascular Disease

## 2020-02-17 VITALS — BP 114/69 | HR 95 | Ht 73.0 in | Wt 196.6 lb

## 2020-02-17 DIAGNOSIS — Z7901 Long term (current) use of anticoagulants: Secondary | ICD-10-CM

## 2020-02-17 DIAGNOSIS — R55 Syncope and collapse: Secondary | ICD-10-CM

## 2020-02-17 DIAGNOSIS — Z950 Presence of cardiac pacemaker: Secondary | ICD-10-CM | POA: Diagnosis not present

## 2020-02-17 DIAGNOSIS — E785 Hyperlipidemia, unspecified: Secondary | ICD-10-CM

## 2020-02-17 DIAGNOSIS — I2510 Atherosclerotic heart disease of native coronary artery without angina pectoris: Secondary | ICD-10-CM

## 2020-02-17 DIAGNOSIS — I480 Paroxysmal atrial fibrillation: Secondary | ICD-10-CM

## 2020-02-17 DIAGNOSIS — I712 Thoracic aortic aneurysm, without rupture: Secondary | ICD-10-CM

## 2020-02-17 DIAGNOSIS — I519 Heart disease, unspecified: Secondary | ICD-10-CM

## 2020-02-17 DIAGNOSIS — I495 Sick sinus syndrome: Secondary | ICD-10-CM

## 2020-02-17 DIAGNOSIS — I714 Abdominal aortic aneurysm, without rupture, unspecified: Secondary | ICD-10-CM

## 2020-02-17 DIAGNOSIS — I7121 Aneurysm of the ascending aorta, without rupture: Secondary | ICD-10-CM

## 2020-02-17 DIAGNOSIS — I10 Essential (primary) hypertension: Secondary | ICD-10-CM

## 2020-02-17 NOTE — Patient Instructions (Signed)
Medication Instructions:  STOP Amlodipine  *If you need a refill on your cardiac medications before your next appointment, please call your pharmacy*   Lab Work: None ordered If you have labs (blood work) drawn today and your tests are completely normal, you will receive your results only by: Marland Kitchen MyChart Message (if you have MyChart) OR . A paper copy in the mail If you have any lab test that is abnormal or we need to change your treatment, we will call you to review the results.   Testing/Procedures: None ordered   Follow-Up: At [facility], you and your health needs are our priority.  As part of our continuing mission to provide you with exceptional heart care, we have created designated Provider Care Teams.  These Care Teams include your primary Cardiologist (physician) and Advanced Practice Providers (APPs -  Physician Assistants and Nurse Practitioners) who all work together to provide you with the care you need, when you need it.  We recommend signing up for the patient portal called "MyChart".  Sign up information is provided on this After Visit Summary.  MyChart is used to connect with patients for Virtual Visits (Telemedicine).  Patients are able to view lab/test results, encounter notes, upcoming appointments, etc.  Non-urgent messages can be sent to your provider as well.   To learn more about what you can do with MyChart, go to [email or web address].    Your next appointment:   12 month(s)  The format for your next appointment:   In Person  Provider:   Sanda Klein, MD

## 2020-02-17 NOTE — Progress Notes (Signed)
Patient ID: Alex Maddox, male   DOB: 11-14-1950, 69 y.o.   MRN: [id number]    Cardiology Office Note    Date:  02/17/2020   ID:  Alex Maddox, DOB [birth year], MRN [id number]  PCP:  Redmond School, MD  Cardiologist:   Sanda Klein, MD   Chief Complaint  Patient presents with  . Near Syncope  . Atrial Fibrillation  . Pacemaker Check    History of Present Illness:  Alex Maddox is a 69 y.o. male returning for routine f/u for paroxysmal atrial fibrillation, decreased LVEF without clinical heart failure, thoracic and abdominal aortic aneurysm, neurally mediated syncope, tachycardia-bradycardia syndrome and pacemaker.  He generally feels well.  He has no complaints of shortness of breath or chest pain either at rest or with activity.  He walks 2 miles and 25 or 30 minutes about 2 or 3 times a week.  He has not had problems with leg edema, orthopnea, PND or palpitations.  He is in atrial fibrillation today and is oblivious to the arrhythmia.  He reports that his blood pressure has been improving and wonders whether he can cut back on some of the medications.  He is compliant with anticoagulation monitoring and has not had any serious bleeding problems or recent falls).  He did have a near syncopal event in church about a week ago, very similar to his past events, preceded by dizziness, flushing and sweating and promptly resolved after lying flat.  His wife reports that his diet is very poor ([place] fries, lots of ketchup, bread, candy, sodas-although he thinks this these are caffeine free/sugar-free and lots of sweet tea).  Despite this he remains relatively lean with a 34 inch waist.  Pacemaker interrogation shows normal device function. Battery voltage is good at 2.86V (RRT 2.81 V).  His device is a Medtronic Revo dual-chamber pacemaker implanted in 2012. He has 50% atrial pacing and 3.5% ventricular pacing. His burden of atrial fibrillation is 12%, and has shown a slowly increasing trend  over the last few years.  Rate control is mediocre, but acceptable.  He has frequent brief episodes of high ventricular rate, but the average ventricular rate during his longer spells of atrial fibrillation is usually around 95-105 bpm.  He has a history of neurally mediated syncope with both cardioinhibitory and vasodepressor components and received a dual-chamber Medtronic MRI conditional permanent pacemaker in August 2012. He has a history of paroxysmal atrial fibrillation and a history of TIA at age 76. He had a traumatic right occipital intracerebral hemorrhage while on warfarin anticoagulation in November 2016. He also has thoracic ascending aortic aneurysm, infrarenal AAA, systemic hypertension and hyperlipidemia. Normal nuclear stress test perfusion pattern in 2012, EF 43%; similar EF by echocardiography. Echo images are very difficult and follow-up echo in 2015 could not quantify EF. He has never had clinical heart failure. He takes a statin for hyperlipidemia as well as valsartan and carvedilol for both the arrhythmia, the HTN and the cardiomyopathy. He has ascending aortic enlargement (4.2 cm) and fusiform ectasia of the infrarenal abdominal aorta (max diam 3.0 cm) and CT evidence of coronary calcification.  Past Medical History:  Diagnosis Date  . Abnormal echocardiogram    stress myoview 10/29/2010  . Arthritis    " IN MY BACK "  . Dyslipidemia   . Dysrhythmia    Atrial fibrillation  . Hypertension   . Paroxysmal atrial fibrillation (HCC)   . Presence of permanent cardiac pacemaker   . Sinus node  dysfunction ([place])   . Sleep apnea, obstructive    sleep study 10/09/2007  AHI 5.73/hr  REM AHI 11.90/hr   . 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   LEFT THUMB   . Stroke ([place])   . Syncope    2D Echocardiogram 09/27/2010 EF between 40 to 45%  . Tachycardia-bradycardia ([place])   . Transient ischemic attack (TIA) 06/07    Past Surgical History:  Procedure Laterality Date  .  COLONOSCOPY    . COLONOSCOPY N/A 06/07/2017   Procedure: COLONOSCOPY;  Surgeon: Rogene Houston, MD;  [facility];  Service: Endoscopy;  Laterality: N/A;  9:30  . COLONOSCOPY N/A 09/20/2018   Procedure: COLONOSCOPY;  Surgeon: Rogene Houston, MD;  [facility];  Service: Endoscopy;  Laterality: N/A;  1030  . ELECTROCARDIOGRAM    . INSERT / REPLACE / REMOVE PACEMAKER  05/2011  . LOOP RECORDER IMPLANT  02/02/2011   Medtronic- CRM   . PACEMAKER INSERTION  05/30/2011   Medtronic Revo   last checked 11/07/2012  . POLYPECTOMY  09/20/2018   Procedure: POLYPECTOMY;  Surgeon: Rogene Houston, MD;  [facility];  Service: Endoscopy;;  colon    Outpatient Medications Prior to Visit  Medication Sig Dispense Refill  . carvedilol (COREG) 25 MG tablet Take 1 tablet (25 mg total) by mouth 2 (two) times daily with a meal. 180 tablet 3  . diazepam (VALIUM) 2 MG tablet Take 2 mg by mouth every 8 (eight) hours as needed for anxiety.     . enoxaparin (LOVENOX) 150 MG/ML injection Inject 1 mL (150 mg total) into the skin daily. 10 mL 0  . finasteride (PROSCAR) 5 MG tablet Take 5 mg by mouth daily.    . Flaxseed Oil OIL Take 1,300 mg by mouth daily.    . irbesartan (AVAPRO) 300 MG tablet TAKE ONE (1) TABLET BY MOUTH EVERY DAY 90 tablet 2  . Multiple Vitamin (MULTIVITAMIN WITH MINERALS) TABS tablet Take 1 tablet by mouth daily. Adult Multivitamin 50+    . MYRBETRIQ 50 MG TB24 tablet Take 1 tablet by mouth daily.    . Omega-3 Fatty Acids (FISH OIL) 1200 MG CAPS Take 1,200 mg by mouth daily.    Vladimir Faster Glycol-Propyl Glycol (LUBRICANT EYE DROPS) 0.4-0.3 % SOLN Place 1 drop into both eyes 3 (three) times daily as needed (for dry eyes.).    Marland Kitchen Saw Palmetto 450 MG CAPS Take 900 mg by mouth daily.    Marland Kitchen warfarin (COUMADIN) 5 MG tablet TAKE ONE TABLET BY MOUTH EVERY DAY AS DIRECTED BY COUMADIN CLINIC 90 tablet 1  . amLODipine (NORVASC) 2.5 MG tablet Take 1 tablet (2.5 mg total) by  mouth daily. 90 tablet 3  . rosuvastatin (CRESTOR) 20 MG tablet Take 1 tablet (20 mg total) by mouth daily. 90 tablet 3   No facility-administered medications prior to visit.     Allergies:   Patient has no known allergies.   Social History   Socioeconomic History  . Marital status: Married    Spouse name: Not on file  . Number of children: 0  . Years of education: Not on file  . Highest education level: Not on file  Occupational History  . Not on file  Tobacco Use  . Smoking status: Never Smoker  . Smokeless tobacco: Never Used  Substance and Sexual Activity  . Alcohol use: Yes    Comment: occasionally  . Drug use: No  . Sexual  activity: Not on file  Other Topics Concern  . 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   Social Determinants of Health   Financial Resource Strain:   . Difficulty of Paying Living Expenses:   Food Insecurity:   . Worried About Charity fundraiser in the Last Year:   . Arboriculturist in the Last Year:   Transportation Needs:   . Film/video editor (Medical):   Marland Kitchen Lack of Transportation (Non-Medical):   Physical Activity:   . Days of Exercise per Week:   . Minutes of Exercise per Session:   Stress:   . Feeling of Stress :   Social Connections:   . Frequency of Communication with Friends and Family:   . Frequency of Social Gatherings with Friends and Family:   . Attends Religious Services:   . Active Member of Clubs or Organizations:   . Attends Archivist Meetings:   Marland Kitchen Marital Status:      Family History:  The patient's family history includes Heart attack (age of onset: 4) in his father; Hypertension in his brother and mother.   ROS:   Please see the history of present illness.    ROS All other systems are reviewed and are negative.   PHYSICAL EXAM:   VS:  BP 114/69   Pulse 95   Ht 6\' 1"  (1.854 m)   Wt 196 lb 9.6 oz (89.2 kg)   SpO2 98%   BMI 25.94 kg/m       General: Alert, oriented x3, no distress, healthy right subclavian pacemaker site Head: no evidence of trauma, PERRL, EOMI, no exophtalmos or lid lag, no myxedema, no xanthelasma; normal ears, nose and oropharynx Neck: normal jugular venous pulsations and no hepatojugular reflux; brisk carotid pulses without delay and no carotid bruits Chest: clear to auscultation, no signs of consolidation by percussion or palpation, normal fremitus, symmetrical and full respiratory excursions Cardiovascular: normal position and quality of the apical impulse, irregular rhythm, normal first and second heart sounds, no murmurs, rubs or gallops Abdomen: no tenderness or distention, no masses by palpation, no abnormal pulsatility or arterial bruits, normal bowel sounds, no hepatosplenomegaly Extremities: no clubbing, cyanosis or edema; 2+ radial, ulnar and brachial pulses bilaterally; 2+ right femoral, posterior tibial and dorsalis pedis pulses; 2+ left femoral, posterior tibial and dorsalis pedis pulses; no subclavian or femoral bruits Neurological: grossly nonfocal Psych: Normal mood and affect   Wt Readings from Last 3 Encounters:  02/17/20 196 lb 9.6 oz (89.2 kg)  08/09/19 194 lb 3.2 oz (88.1 kg)  02/04/19 191 lb (86.6 kg)      Studies/Labs Reviewed:   EKG:  EKG is ordered today.  It shows atrial fibrillation with controlled ventricular rate and right bundle branch block.  CTA 05/24/2019: 1. Stable mild aneurysmal disease of the ascending thoracic aorta with maximal diameter of approximately 4.2 cm. 2. Stable coronary atherosclerosis. 3. Stable 5 mm right upper lobe and 6 mm right middle lobe pulmonary nodules. 4. Stable to minimally larger ectasia/mild aneurysmal disease of the distal abdominal aorta measuring 2.8 x 3.0 cm. 5. Non critical and stable appearing roughly 40% stenosis of the proximal SMA trunk and 40-50% stenosis of the proximal right renal artery. 6. Small left inguinal hernia  containing fat.  BMET    Component Value Date/Time   NA 142 08/09/2019 0912   K 3.9 08/09/2019 0912   CL 101 08/09/2019 0912  CO2 23 08/09/2019 0912   GLUCOSE 88 08/09/2019 0912   GLUCOSE 88 07/05/2018 0533   BUN 19 08/09/2019 0912   CREATININE 1.16 08/09/2019 0912   CREATININE 1.19 01/16/2017 0846   CALCIUM 10.5 (H) 08/09/2019 0912   GFRNONAA 65 08/09/2019 0912   GFRAA 75 08/09/2019 0912   Lipid Panel     Component Value Date/Time   CHOL 155 08/09/2019 0912   TRIG 106 08/09/2019 0912   HDL 39 (L) 08/09/2019 0912   CHOLHDL 4.0 08/09/2019 0912   LDLCALC 96 08/09/2019 0912   LABVLDL 20 08/09/2019 0912     ASSESSMENT:    1. Paroxysmal atrial fibrillation (HCC)   2. Tachycardia-bradycardia syndrome ([place])   3. Long term (current) use of anticoagulants   4. Neurocardiogenic syncope   5. Pacemaker   6. Ascending aortic aneurysm ([place])   7. AAA (abdominal aortic aneurysm) without rupture ([place])   8. Essential hypertension   9. Coronary artery calcification seen on CT scan   10. Left ventricular dysfunction   11. Dyslipidemia (high LDL; low HDL)      PLAN:  In order of problems listed above:  1. PAF: Rate control is acceptable, albeit not perfect.  My records state that he should be taking carvedilol 25 mg twice daily, but he wonders whether he is not taking 12.5 mg tablets.  Asked him to check and call us when he gets home.  Rate control needs to be as good as possible, especially with abnormal LVEF.  He is appropriately anticoagulated. CHADS Vasc 5 (TIA 2, HTN, vascular disease, LV dysfunction). 2. Tachy-brady sd: He used to have very infrequent pacing, but with the increased dose of beta-blocker he is now pacing the atrium about 50% of the time.  He almost never paces the ventricle. 3. Warfarin: Compliant with anticoagulation without serious bleeding complications.  Has a remote history of TIA 4. Neurally mediated syncope: Recent near syncopal event.  He has a very  typical prodrome and should promptly lay flat when he notices it.   I think we need to be a little more liberal with his blood pressure control and I be happy if his blood pressure is in the 130-140/80-90 range, even acknowledging abnormal EF.  We will stop his amlodipine and continue beta-blocker and ARB. 5. PPM:  Normal device function, Carelink Q 3 mos. Yearly office check. 6. Asc Ao aneurysm: Asymptomatic.  CT angiogram performed in August 2020 showed a stable aneurysm size at 4.2 cm (unchanged since 2010).  Since there has been such a little change in aneurysm size, will only monitor every other year. 7. AAA: CT abdomen performed in August 2020 shows a slight increase from 2.8 cm to 3.0 cm fusiform infrarenal aneurysm.  His aortic aneurysms are another reason it is important for him to stay on a beta-blocker.  Recheck this next year as well. 8. HTN: Blood pressure improved.  We will stop the amlodipine. 9. Coronary Ca: He does not have angina despite walking 2 or 3 days a week.  He had a normal nuclear stress test in the past (2012). On statin. 10. LV dysfunction: Clinically euvolemic, NYHA functional class I without need for diuretics.. EF around 43% based on previous echo and nuclear stress test, but he has never had clinical manifestations of heart failure.  11. HLP: Target LDL less than 70, markedly improved after we switched him to rosuvastatin.  The HDL remains stably low.  I encouraged him to increase his walking from 2  or 3 days a week to at least 5 days a week.  He is only minimally overweight.  Medication Adjustments/Labs and Tests Ordered: Current medicines are reviewed at length with the patient today.  Concerns regarding medicines are outlined above.  Medication changes, Labs and Tests ordered today are listed in the Patient Instructions below. Patient Instructions  Medication Instructions:  STOP Amlodipine  *If you need a refill on your cardiac medications before your next  appointment, please call your pharmacy*   Lab Work: None ordered If you have labs (blood work) drawn today and your tests are completely normal, you will receive your results only by: Marland Kitchen MyChart Message (if you have MyChart) OR . A paper copy in the mail If you have any lab test that is abnormal or we need to change your treatment, we will call you to review the results.   Testing/Procedures: None ordered   Follow-Up: At [facility], you and your health needs are our priority.  As part of our continuing mission to provide you with exceptional heart care, we have created designated Provider Care Teams.  These Care Teams include your primary Cardiologist (physician) and Advanced Practice Providers (APPs -  Physician Assistants and Nurse Practitioners) who all work together to provide you with the care you need, when you need it.  We recommend signing up for the patient portal called "MyChart".  Sign up information is provided on this After Visit Summary.  MyChart is used to connect with patients for Virtual Visits (Telemedicine).  Patients are able to view lab/test results, encounter notes, upcoming appointments, etc.  Non-urgent messages can be sent to your provider as well.   To learn more about what you can do with MyChart, go to [email or web address].    Your next appointment:   12 month(s)  The format for your next appointment:   In Person  Provider:   Sanda Klein, MD          Signed, Sanda Klein, MD  02/17/2020 5:43 PM    [address], [place], [place]  [address] Phone: [phone]; Fax: [phone]

## 2020-02-19 ENCOUNTER — Telehealth: Payer: Self-pay | Admitting: Cardiovascular Disease

## 2020-02-19 NOTE — Telephone Encounter (Signed)
Spoke with Mr. Goulet and confirmed that he is correct in taking his COREG 25 mg twice daily. He thanked me for the call.

## 2020-02-19 NOTE — Telephone Encounter (Signed)
Pt c/o medication issue:  1. Name of Medication: carvedilol (COREG) 25 MG tablet  2. How are you currently taking this medication (dosage and times per day)? 2x a day  3. Are you having a reaction (difficulty breathing--STAT)?    4. What is your medication issue? Patient called to say he takes twice a day.  He wants to make sure that is the way he to take it.  His amlodipine was discontinued earlier this week.

## 2020-03-05 DIAGNOSIS — N1831 Chronic kidney disease, stage 3a: Secondary | ICD-10-CM | POA: Diagnosis not present

## 2020-03-05 DIAGNOSIS — E559 Vitamin D deficiency, unspecified: Secondary | ICD-10-CM | POA: Diagnosis not present

## 2020-03-05 DIAGNOSIS — D631 Anemia in chronic kidney disease: Secondary | ICD-10-CM | POA: Diagnosis not present

## 2020-03-05 DIAGNOSIS — R809 Proteinuria, unspecified: Secondary | ICD-10-CM | POA: Diagnosis not present

## 2020-03-05 DIAGNOSIS — Z79899 Other long term (current) drug therapy: Secondary | ICD-10-CM | POA: Diagnosis not present

## 2020-03-09 DIAGNOSIS — E7849 Other hyperlipidemia: Secondary | ICD-10-CM | POA: Diagnosis not present

## 2020-03-09 DIAGNOSIS — I10 Essential (primary) hypertension: Secondary | ICD-10-CM | POA: Diagnosis not present

## 2020-03-09 DIAGNOSIS — I480 Paroxysmal atrial fibrillation: Secondary | ICD-10-CM | POA: Diagnosis not present

## 2020-03-12 DIAGNOSIS — N1831 Chronic kidney disease, stage 3a: Secondary | ICD-10-CM | POA: Diagnosis not present

## 2020-03-12 DIAGNOSIS — D696 Thrombocytopenia, unspecified: Secondary | ICD-10-CM | POA: Diagnosis not present

## 2020-03-12 DIAGNOSIS — I129 Hypertensive chronic kidney disease with stage 1 through stage 4 chronic kidney disease, or unspecified chronic kidney disease: Secondary | ICD-10-CM | POA: Diagnosis not present

## 2020-03-12 DIAGNOSIS — R809 Proteinuria, unspecified: Secondary | ICD-10-CM | POA: Diagnosis not present

## 2020-03-23 ENCOUNTER — Ambulatory Visit (INDEPENDENT_AMBULATORY_CARE_PROVIDER_SITE_OTHER): Payer: Medicare Other | Admitting: *Deleted

## 2020-03-23 DIAGNOSIS — I4891 Unspecified atrial fibrillation: Secondary | ICD-10-CM

## 2020-03-23 DIAGNOSIS — Z5181 Encounter for therapeutic drug level monitoring: Secondary | ICD-10-CM

## 2020-03-23 LAB — POCT INR: INR: 2.4 (ref 2.0–3.0)

## 2020-03-23 NOTE — Patient Instructions (Signed)
Continue 1 tablet daily except 1/2 tablet on Mondays and Thursdays Continue greens  Recheck in 6 weeks Does not want to change to Eliquis due to cost

## 2020-04-17 DIAGNOSIS — E7849 Other hyperlipidemia: Secondary | ICD-10-CM | POA: Diagnosis not present

## 2020-04-17 DIAGNOSIS — J309 Allergic rhinitis, unspecified: Secondary | ICD-10-CM | POA: Diagnosis not present

## 2020-04-17 DIAGNOSIS — Z1389 Encounter for screening for other disorder: Secondary | ICD-10-CM | POA: Diagnosis not present

## 2020-04-17 DIAGNOSIS — Z950 Presence of cardiac pacemaker: Secondary | ICD-10-CM | POA: Diagnosis not present

## 2020-04-17 DIAGNOSIS — Z0000 Encounter for general adult medical examination without abnormal findings: Secondary | ICD-10-CM | POA: Diagnosis not present

## 2020-04-23 ENCOUNTER — Ambulatory Visit (INDEPENDENT_AMBULATORY_CARE_PROVIDER_SITE_OTHER): Payer: Medicare Other | Admitting: *Deleted

## 2020-04-23 DIAGNOSIS — I495 Sick sinus syndrome: Secondary | ICD-10-CM | POA: Diagnosis not present

## 2020-04-24 ENCOUNTER — Telehealth: Payer: Self-pay

## 2020-04-24 LAB — CUP PACEART REMOTE DEVICE CHECK
Battery Voltage: 2.85 V
Brady Statistic AP VP Percent: 8.52 %
Brady Statistic AP VS Percent: 42.5 %
Brady Statistic AS VP Percent: 1.28 %
Brady Statistic AS VS Percent: 47.7 %
Brady Statistic RA Percent Paced: 45.35 %
Brady Statistic RV Percent Paced: 9.39 %
Date Time Interrogation Session: 20210715144216
Implantable Lead Implant Date: 20120820
Implantable Lead Implant Date: 20120820
Implantable Lead Location: 753859
Implantable Lead Location: 753860
Implantable Pulse Generator Implant Date: 20120820
Lead Channel Impedance Value: 400 Ohm
Lead Channel Impedance Value: 432 Ohm
Lead Channel Sensing Intrinsic Amplitude: 5.456 mV
Lead Channel Sensing Intrinsic Amplitude: 6.579 mV
Lead Channel Setting Pacing Amplitude: 2 V
Lead Channel Setting Pacing Amplitude: 2.5 V
Lead Channel Setting Pacing Pulse Width: 0.6 ms
Lead Channel Setting Sensing Sensitivity: 0.9 mV

## 2020-04-24 NOTE — Telephone Encounter (Signed)
Scheduled transmission received, battery is at 2.85v (RRT at 2.81v).  Set up monthly battery checks, pt notified.

## 2020-04-27 NOTE — Progress Notes (Signed)
Remote pacemaker transmission.   

## 2020-05-04 ENCOUNTER — Ambulatory Visit (INDEPENDENT_AMBULATORY_CARE_PROVIDER_SITE_OTHER): Payer: Medicare Other | Admitting: *Deleted

## 2020-05-04 ENCOUNTER — Other Ambulatory Visit: Payer: Self-pay

## 2020-05-04 DIAGNOSIS — I4891 Unspecified atrial fibrillation: Secondary | ICD-10-CM

## 2020-05-04 DIAGNOSIS — Z5181 Encounter for therapeutic drug level monitoring: Secondary | ICD-10-CM

## 2020-05-04 LAB — POCT INR: INR: 2.6 (ref 2.0–3.0)

## 2020-05-04 NOTE — Patient Instructions (Signed)
Continue 1 tablet daily except 1/2 tablet on Mondays and Thursdays Continue greens  Recheck in 6 weeks Does not want to change to Eliquis due to cost

## 2020-05-08 DIAGNOSIS — I480 Paroxysmal atrial fibrillation: Secondary | ICD-10-CM | POA: Diagnosis not present

## 2020-05-08 DIAGNOSIS — I129 Hypertensive chronic kidney disease with stage 1 through stage 4 chronic kidney disease, or unspecified chronic kidney disease: Secondary | ICD-10-CM | POA: Diagnosis not present

## 2020-05-08 DIAGNOSIS — N1831 Chronic kidney disease, stage 3a: Secondary | ICD-10-CM | POA: Diagnosis not present

## 2020-05-08 DIAGNOSIS — E7849 Other hyperlipidemia: Secondary | ICD-10-CM | POA: Diagnosis not present

## 2020-05-25 ENCOUNTER — Ambulatory Visit (INDEPENDENT_AMBULATORY_CARE_PROVIDER_SITE_OTHER): Payer: Medicare Other | Admitting: *Deleted

## 2020-05-25 DIAGNOSIS — I4891 Unspecified atrial fibrillation: Secondary | ICD-10-CM

## 2020-05-27 LAB — CUP PACEART REMOTE DEVICE CHECK
Battery Voltage: 2.83 V
Brady Statistic AP VP Percent: 11.98 %
Brady Statistic AP VS Percent: 31.32 %
Brady Statistic AS VP Percent: 1.33 %
Brady Statistic AS VS Percent: 55.37 %
Brady Statistic RA Percent Paced: 38.3 %
Brady Statistic RV Percent Paced: 12.8 %
Date Time Interrogation Session: 20210817103234
Implantable Lead Implant Date: 20120820
Implantable Lead Implant Date: 20120820
Implantable Lead Location: 753859
Implantable Lead Location: 753860
Implantable Pulse Generator Implant Date: 20120820
Lead Channel Impedance Value: 408 Ohm
Lead Channel Impedance Value: 448 Ohm
Lead Channel Sensing Intrinsic Amplitude: 5.72 mV
Lead Channel Setting Pacing Amplitude: 2 V
Lead Channel Setting Pacing Amplitude: 2.5 V
Lead Channel Setting Pacing Pulse Width: 0.6 ms
Lead Channel Setting Sensing Sensitivity: 0.9 mV

## 2020-06-02 NOTE — Addendum Note (Signed)
Addended by: Douglass Rivers D on: 06/02/2020 03:53 PM   Modules accepted: Level of Service

## 2020-06-02 NOTE — Progress Notes (Signed)
Remote pacemaker transmission.   

## 2020-06-16 ENCOUNTER — Ambulatory Visit (INDEPENDENT_AMBULATORY_CARE_PROVIDER_SITE_OTHER): Payer: Medicare Other | Admitting: Pharmacist

## 2020-06-16 DIAGNOSIS — Z5181 Encounter for therapeutic drug level monitoring: Secondary | ICD-10-CM

## 2020-06-16 DIAGNOSIS — I4891 Unspecified atrial fibrillation: Secondary | ICD-10-CM | POA: Diagnosis not present

## 2020-06-16 LAB — POCT INR: INR: 2.3 (ref 2.0–3.0)

## 2020-06-16 NOTE — Patient Instructions (Signed)
Description   Continue 1 tablet daily except 1/2 tablet on Mondays and Thursdays Continue greens Recheck in 6 weeks

## 2020-06-25 ENCOUNTER — Ambulatory Visit (INDEPENDENT_AMBULATORY_CARE_PROVIDER_SITE_OTHER): Payer: Medicare Other | Admitting: *Deleted

## 2020-06-25 DIAGNOSIS — I495 Sick sinus syndrome: Secondary | ICD-10-CM

## 2020-06-28 LAB — CUP PACEART REMOTE DEVICE CHECK
Battery Voltage: 2.83 V
Brady Statistic AP VP Percent: 7.2 %
Brady Statistic AP VS Percent: 36.02 %
Brady Statistic AS VP Percent: 1.78 %
Brady Statistic AS VS Percent: 54.99 %
Brady Statistic RA Percent Paced: 39.14 %
Brady Statistic RV Percent Paced: 8.79 %
Date Time Interrogation Session: 20210917214626
Implantable Lead Implant Date: 20120820
Implantable Lead Implant Date: 20120820
Implantable Lead Location: 753859
Implantable Lead Location: 753860
Implantable Pulse Generator Implant Date: 20120820
Lead Channel Impedance Value: 416 Ohm
Lead Channel Impedance Value: 440 Ohm
Lead Channel Sensing Intrinsic Amplitude: 5.28 mV
Lead Channel Sensing Intrinsic Amplitude: 6.579 mV
Lead Channel Setting Pacing Amplitude: 2 V
Lead Channel Setting Pacing Amplitude: 2.5 V
Lead Channel Setting Pacing Pulse Width: 0.6 ms
Lead Channel Setting Sensing Sensitivity: 0.9 mV

## 2020-06-29 NOTE — Progress Notes (Signed)
Remote pacemaker transmission.   

## 2020-06-29 NOTE — Addendum Note (Signed)
Addended by: Cheri Kearns A on: 06/29/2020 08:46 AM   Modules accepted: Level of Service

## 2020-07-06 DIAGNOSIS — D696 Thrombocytopenia, unspecified: Secondary | ICD-10-CM | POA: Diagnosis not present

## 2020-07-09 DIAGNOSIS — E7849 Other hyperlipidemia: Secondary | ICD-10-CM | POA: Diagnosis not present

## 2020-07-09 DIAGNOSIS — I480 Paroxysmal atrial fibrillation: Secondary | ICD-10-CM | POA: Diagnosis not present

## 2020-07-09 DIAGNOSIS — I10 Essential (primary) hypertension: Secondary | ICD-10-CM | POA: Diagnosis not present

## 2020-07-23 ENCOUNTER — Ambulatory Visit (INDEPENDENT_AMBULATORY_CARE_PROVIDER_SITE_OTHER): Payer: Medicare Other

## 2020-07-23 DIAGNOSIS — I495 Sick sinus syndrome: Secondary | ICD-10-CM

## 2020-07-25 LAB — CUP PACEART REMOTE DEVICE CHECK
Battery Voltage: 2.82 V
Brady Statistic AP VP Percent: 4.91 %
Brady Statistic AP VS Percent: 43.52 %
Brady Statistic AS VP Percent: 1.47 %
Brady Statistic AS VS Percent: 50.1 %
Brady Statistic RA Percent Paced: 42.9 %
Brady Statistic RV Percent Paced: 6.15 %
Date Time Interrogation Session: 20211015233000
Implantable Lead Implant Date: 20120820
Implantable Lead Implant Date: 20120820
Implantable Lead Location: 753859
Implantable Lead Location: 753860
Implantable Pulse Generator Implant Date: 20120820
Lead Channel Impedance Value: 424 Ohm
Lead Channel Impedance Value: 440 Ohm
Lead Channel Sensing Intrinsic Amplitude: 5.588 mV
Lead Channel Sensing Intrinsic Amplitude: 6.25 mV
Lead Channel Setting Pacing Amplitude: 2 V
Lead Channel Setting Pacing Amplitude: 2.5 V
Lead Channel Setting Pacing Pulse Width: 0.6 ms
Lead Channel Setting Sensing Sensitivity: 0.9 mV

## 2020-07-28 ENCOUNTER — Ambulatory Visit (INDEPENDENT_AMBULATORY_CARE_PROVIDER_SITE_OTHER): Payer: Medicare Other | Admitting: *Deleted

## 2020-07-28 DIAGNOSIS — I4891 Unspecified atrial fibrillation: Secondary | ICD-10-CM

## 2020-07-28 DIAGNOSIS — Z5181 Encounter for therapeutic drug level monitoring: Secondary | ICD-10-CM

## 2020-07-28 LAB — POCT INR: INR: 2.8 (ref 2.0–3.0)

## 2020-07-28 NOTE — Patient Instructions (Signed)
Continue 1 tablet daily except 1/2 tablet on Mondays and Thursdays Continue greens Recheck in 6 weeks 

## 2020-07-28 NOTE — Progress Notes (Signed)
Remote pacemaker transmission.   

## 2020-07-29 DIAGNOSIS — L821 Other seborrheic keratosis: Secondary | ICD-10-CM | POA: Diagnosis not present

## 2020-07-29 DIAGNOSIS — L570 Actinic keratosis: Secondary | ICD-10-CM | POA: Diagnosis not present

## 2020-07-29 DIAGNOSIS — D225 Melanocytic nevi of trunk: Secondary | ICD-10-CM | POA: Diagnosis not present

## 2020-07-29 DIAGNOSIS — D485 Neoplasm of uncertain behavior of skin: Secondary | ICD-10-CM | POA: Diagnosis not present

## 2020-08-03 ENCOUNTER — Other Ambulatory Visit: Payer: Self-pay | Admitting: Cardiovascular Disease

## 2020-08-10 ENCOUNTER — Other Ambulatory Visit: Payer: Self-pay | Admitting: Cardiovascular Disease

## 2020-08-24 ENCOUNTER — Other Ambulatory Visit: Payer: Self-pay | Admitting: Cardiovascular Disease

## 2020-08-24 NOTE — Telephone Encounter (Signed)
Rx has been sent to the pharmacy electronically. ° °

## 2020-08-27 ENCOUNTER — Ambulatory Visit: Payer: Medicare Other

## 2020-08-27 DIAGNOSIS — I495 Sick sinus syndrome: Secondary | ICD-10-CM

## 2020-08-30 ENCOUNTER — Encounter: Payer: Self-pay | Admitting: Cardiovascular Disease

## 2020-08-30 LAB — CUP PACEART REMOTE DEVICE CHECK
Battery Voltage: 2.82 V
Brady Statistic AP VP Percent: 7.85 %
Brady Statistic AP VS Percent: 35.51 %
Brady Statistic AS VP Percent: 0.92 %
Brady Statistic AS VS Percent: 55.72 %
Brady Statistic RA Percent Paced: 39.2 %
Brady Statistic RV Percent Paced: 8.38 %
Date Time Interrogation Session: 20211120231634
Implantable Lead Implant Date: 20120820
Implantable Lead Implant Date: 20120820
Implantable Lead Location: 753859
Implantable Lead Location: 753860
Implantable Pulse Generator Implant Date: 20120820
Lead Channel Impedance Value: 416 Ohm
Lead Channel Impedance Value: 432 Ohm
Lead Channel Sensing Intrinsic Amplitude: 6.248 mV
Lead Channel Sensing Intrinsic Amplitude: 8.553 mV
Lead Channel Setting Pacing Amplitude: 2 V
Lead Channel Setting Pacing Amplitude: 2.5 V
Lead Channel Setting Pacing Pulse Width: 0.6 ms
Lead Channel Setting Sensing Sensitivity: 0.9 mV

## 2020-08-31 NOTE — Progress Notes (Signed)
Remote pacemaker transmission.   

## 2020-08-31 NOTE — Addendum Note (Signed)
Addended by: Cheri Kearns A on: 08/31/2020 09:11 AM   Modules accepted: Level of Service

## 2020-09-02 DIAGNOSIS — H6690 Otitis media, unspecified, unspecified ear: Secondary | ICD-10-CM | POA: Diagnosis not present

## 2020-09-02 DIAGNOSIS — H6123 Impacted cerumen, bilateral: Secondary | ICD-10-CM | POA: Diagnosis not present

## 2020-09-05 ENCOUNTER — Other Ambulatory Visit: Payer: Self-pay | Admitting: Cardiovascular Disease

## 2020-09-07 DIAGNOSIS — B349 Viral infection, unspecified: Secondary | ICD-10-CM | POA: Diagnosis not present

## 2020-09-08 ENCOUNTER — Ambulatory Visit (INDEPENDENT_AMBULATORY_CARE_PROVIDER_SITE_OTHER): Payer: Medicare Other | Admitting: *Deleted

## 2020-09-08 DIAGNOSIS — K529 Noninfective gastroenteritis and colitis, unspecified: Secondary | ICD-10-CM | POA: Diagnosis not present

## 2020-09-08 DIAGNOSIS — I480 Paroxysmal atrial fibrillation: Secondary | ICD-10-CM | POA: Diagnosis not present

## 2020-09-08 DIAGNOSIS — I4891 Unspecified atrial fibrillation: Secondary | ICD-10-CM | POA: Diagnosis not present

## 2020-09-08 DIAGNOSIS — Z5181 Encounter for therapeutic drug level monitoring: Secondary | ICD-10-CM

## 2020-09-08 DIAGNOSIS — E7849 Other hyperlipidemia: Secondary | ICD-10-CM | POA: Diagnosis not present

## 2020-09-08 DIAGNOSIS — I10 Essential (primary) hypertension: Secondary | ICD-10-CM | POA: Diagnosis not present

## 2020-09-08 LAB — POCT INR
INR: 2.4 (ref 2.0–3.0)
INR: 2.4 (ref 2.0–3.0)

## 2020-09-08 NOTE — Patient Instructions (Signed)
Continue 1 tablet daily except 1/2 tablet on Mondays and Thursdays Continue greens Recheck in 6 weeks 

## 2020-09-10 DIAGNOSIS — N1831 Chronic kidney disease, stage 3a: Secondary | ICD-10-CM | POA: Diagnosis not present

## 2020-09-10 DIAGNOSIS — I129 Hypertensive chronic kidney disease with stage 1 through stage 4 chronic kidney disease, or unspecified chronic kidney disease: Secondary | ICD-10-CM | POA: Diagnosis not present

## 2020-09-10 DIAGNOSIS — D696 Thrombocytopenia, unspecified: Secondary | ICD-10-CM | POA: Diagnosis not present

## 2020-09-10 DIAGNOSIS — R809 Proteinuria, unspecified: Secondary | ICD-10-CM | POA: Diagnosis not present

## 2020-09-16 DIAGNOSIS — I952 Hypotension due to drugs: Secondary | ICD-10-CM | POA: Diagnosis not present

## 2020-09-16 DIAGNOSIS — R809 Proteinuria, unspecified: Secondary | ICD-10-CM | POA: Diagnosis not present

## 2020-09-16 DIAGNOSIS — D696 Thrombocytopenia, unspecified: Secondary | ICD-10-CM | POA: Diagnosis not present

## 2020-09-16 DIAGNOSIS — E870 Hyperosmolality and hypernatremia: Secondary | ICD-10-CM | POA: Diagnosis not present

## 2020-09-16 DIAGNOSIS — N1831 Chronic kidney disease, stage 3a: Secondary | ICD-10-CM | POA: Diagnosis not present

## 2020-09-22 DIAGNOSIS — J069 Acute upper respiratory infection, unspecified: Secondary | ICD-10-CM | POA: Diagnosis not present

## 2020-10-20 ENCOUNTER — Other Ambulatory Visit: Payer: Self-pay

## 2020-10-20 ENCOUNTER — Ambulatory Visit (INDEPENDENT_AMBULATORY_CARE_PROVIDER_SITE_OTHER): Payer: Medicare Other | Admitting: *Deleted

## 2020-10-20 DIAGNOSIS — Z5181 Encounter for therapeutic drug level monitoring: Secondary | ICD-10-CM

## 2020-10-20 DIAGNOSIS — I4891 Unspecified atrial fibrillation: Secondary | ICD-10-CM | POA: Diagnosis not present

## 2020-10-20 LAB — POCT INR: INR: 2.7 (ref 2.0–3.0)

## 2020-10-20 NOTE — Patient Instructions (Signed)
Continue 1 tablet daily except 1/2 tablet on Mondays and Thursdays Continue greens Recheck in 6 weeks 

## 2020-10-27 ENCOUNTER — Other Ambulatory Visit: Payer: Self-pay | Admitting: Podiatry

## 2020-10-27 ENCOUNTER — Other Ambulatory Visit: Payer: Self-pay

## 2020-10-27 ENCOUNTER — Ambulatory Visit: Payer: Medicare Other

## 2020-10-27 ENCOUNTER — Ambulatory Visit (INDEPENDENT_AMBULATORY_CARE_PROVIDER_SITE_OTHER): Payer: Medicare Other | Admitting: Podiatry

## 2020-10-27 DIAGNOSIS — M778 Other enthesopathies, not elsewhere classified: Secondary | ICD-10-CM

## 2020-10-27 DIAGNOSIS — M722 Plantar fascial fibromatosis: Secondary | ICD-10-CM

## 2020-10-27 DIAGNOSIS — G5793 Unspecified mononeuropathy of bilateral lower limbs: Secondary | ICD-10-CM | POA: Diagnosis not present

## 2020-10-27 NOTE — Progress Notes (Signed)
Subjective:  Patient ID: Alex Maddox, male    DOB: 09-28-1951,  MRN: [id number] HPI Chief Complaint  Patient presents with  . Numbness    Numbness and tinlging at BL toes (1-10) -pt denies injury x couple mo; very min. Pain; 5/10 -w/ frequent falls - pt denies redness -w/ swelling Tx: OTC pain spray     70 y.o. male presents with the above complaint.   ROS: Denies fever chills nausea vomiting muscle aches pains calf pain back pain chest pain shortness of breath.  Does have a history of arthritis of the back and disc disease.  Past Medical History:  Diagnosis Date  . Abnormal echocardiogram    stress myoview 10/29/2010  . Arthritis    " IN MY BACK "  . Dyslipidemia   . Dysrhythmia    Atrial fibrillation  . Hypertension   . Paroxysmal atrial fibrillation (HCC)   . Presence of permanent cardiac pacemaker   . Sinus node dysfunction (HCC)   . Sleep apnea, obstructive    sleep study 10/09/2007  AHI 5.73/hr  REM AHI 11.90/hr   . 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   LEFT THUMB   . Stroke ([place])   . Syncope    2D Echocardiogram 09/27/2010 EF between 40 to 45%  . Tachycardia-bradycardia ([place])   . Transient ischemic attack (TIA) 06/07   Past Surgical History:  Procedure Laterality Date  . COLONOSCOPY    . COLONOSCOPY N/A 06/07/2017   Procedure: COLONOSCOPY;  Surgeon: Rogene Houston, MD;  [facility];  Service: Endoscopy;  Laterality: N/A;  9:30  . COLONOSCOPY N/A 09/20/2018   Procedure: COLONOSCOPY;  Surgeon: Rogene Houston, MD;  [facility];  Service: Endoscopy;  Laterality: N/A;  1030  . ELECTROCARDIOGRAM    . INSERT / REPLACE / REMOVE PACEMAKER  05/2011  . LOOP RECORDER IMPLANT  02/02/2011   Medtronic- CRM   . PACEMAKER INSERTION  05/30/2011   Medtronic Revo   last checked 11/07/2012  . POLYPECTOMY  09/20/2018   Procedure: POLYPECTOMY;  Surgeon: Rogene Houston, MD;  [facility];  Service: Endoscopy;;  colon    Current  Outpatient Medications:  .  carvedilol (COREG) 25 MG tablet, TAKE ONE TABLET (25MG  TOTAL) BY MOUTH TWO TIMES DAILY WITH A MEAL, Disp: 180 tablet, Rfl: 2 .  diazepam (VALIUM) 2 MG tablet, Take 2 mg by mouth every 8 (eight) hours as needed for anxiety. , Disp: , Rfl:  .  finasteride (PROSCAR) 5 MG tablet, Take 5 mg by mouth daily., Disp: , Rfl:  .  Flaxseed Oil OIL, Take 1,300 mg by mouth daily., Disp: , Rfl:  .  irbesartan (AVAPRO) 300 MG tablet, TAKE ONE (1) TABLET BY MOUTH EVERY DAY, Disp: 90 tablet, Rfl: 1 .  Multiple Vitamin (MULTIVITAMIN WITH MINERALS) TABS tablet, Take 1 tablet by mouth daily. Adult Multivitamin 50+, Disp: , Rfl:  .  Omega-3 Fatty Acids (FISH OIL) 1200 MG CAPS, Take 1,200 mg by mouth daily., Disp: , Rfl:  .  Polyethyl Glycol-Propyl Glycol (LUBRICANT EYE DROPS) 0.4-0.3 % SOLN, Place 1 drop into both eyes 3 (three) times daily as needed (for dry eyes.)., Disp: , Rfl:  .  rosuvastatin (CRESTOR) 20 MG tablet, TAKE ONE TABLET (20MG  TOTAL) BY MOUTH DAILY, Disp: 90 tablet, Rfl: 3 .  Saw Palmetto 450 MG CAPS, Take 900 mg by mouth daily., Disp: , Rfl:  .  warfarin (COUMADIN) 5 MG tablet, TAKE ONE  TABLET BY MOUTH EVERY DAY AS DIRECTED BY COUMADIN CLINIC, Disp: 90 tablet, Rfl: 1  No Known Allergies Review of Systems Objective:  There were no vitals filed for this visit.  General: Well developed, nourished, in no acute distress, alert and oriented x3   Dermatological: Skin is warm, dry and supple bilateral. Nails x 10 are well maintained; remaining integument appears unremarkable at this time. There are no open sores, no preulcerative lesions, no rash or signs of infection present.  Vascular: Dorsalis Pedis artery and Posterior Tibial artery pedal pulses are 2/4 bilateral with immedate capillary fill time. Pedal hair growth present. No varicosities and no lower extremity edema present bilateral.   Neruologic: Grossly intact via light touch bilateral. Vibratory intact via tuning fork  bilateral. Protective threshold with Semmes Wienstein monofilament profoundly diminished to all pedal sites bilateral. Patellar and Achilles deep tendon reflexes 2+ bilateral. No Babinski or clonus noted bilateral.   Musculoskeletal: No gross boney pedal deformities bilateral. No pain, crepitus, or limitation noted with foot and ankle range of motion bilateral. Muscular strength 5/5 in all groups tested bilateral.  Gait: Unassisted, Nonantalgic.    Radiographs:  Radiographs taken today do not demonstrate any type of acute abnormalities.  Osseously mature individual no osteopenia noted.  Assessment & Plan:   Assessment: Profound neuropathy.  Plan: Request a nerve conduction velocity exam with an EMG.  Consider neurological consult.     Marene Gilliam T. [place], [place]

## 2020-10-28 ENCOUNTER — Encounter: Payer: Self-pay | Admitting: Neurology

## 2020-11-07 DIAGNOSIS — I10 Essential (primary) hypertension: Secondary | ICD-10-CM | POA: Diagnosis not present

## 2020-11-07 DIAGNOSIS — E7849 Other hyperlipidemia: Secondary | ICD-10-CM | POA: Diagnosis not present

## 2020-11-07 DIAGNOSIS — I480 Paroxysmal atrial fibrillation: Secondary | ICD-10-CM | POA: Diagnosis not present

## 2020-11-11 ENCOUNTER — Ambulatory Visit: Payer: Medicare Other | Admitting: Neurology

## 2020-11-11 ENCOUNTER — Other Ambulatory Visit: Payer: Self-pay

## 2020-11-11 DIAGNOSIS — G5793 Unspecified mononeuropathy of bilateral lower limbs: Secondary | ICD-10-CM

## 2020-11-11 DIAGNOSIS — R202 Paresthesia of skin: Secondary | ICD-10-CM

## 2020-11-11 NOTE — Procedures (Signed)
[facility]  [address], [place]  [place], [place] [address] Tel: [phone] Fax:  [phone] Test Date:  11/11/2020  Patient: Alex Maddox DOB: 02/07/1951 Physician: Narda Amber, DO  Sex: Male Height: 6\' 1"  Ref Phys: Max T. [place], [place]  ID#: [id number]   Technician:    Patient Complaints: This is a 70 year old man referred for evaluation of bilateral feet paresthesias.  NCV & EMG Findings: Extensive electrodiagnostic testing of the right lower extremity and additional studies of the left shows:  1. Bilateral sural and superficial peroneal sensory responses are within normal limits. 2. Bilateral peroneal and tibial motor responses are within normal limits. 3. Bilateral tibial H reflex studies are within normal limits. 4. There is no evidence of active or chronic motor axonal loss changes affecting any of the tested muscles.  Proximal and deep muscles were not tested as the patient is on anticoagulation therapy.   Impression: This is a normal study of the lower extremities.  In particular, there is no evidence of a sensorimotor polyneuropathy or lumbosacral radiculopathy.   ___________________________ Narda Amber, DO    Nerve Conduction Studies Anti Sensory Summary Table   Stim Site NR Peak (ms) Norm Peak (ms) P-T Amp (V) Norm P-T Amp  Left Sup Peroneal Anti Sensory (Ant Lat Mall)  33C  12 cm    2.9 <4.6 13.0 >3  Right Sup Peroneal Anti Sensory (Ant Lat Mall)  33C  12 cm    2.3 <4.6 14.8 >3  Left Sural Anti Sensory (Lat Mall)  33C  Calf    3.0 <4.6 8.5 >3  Right Sural Anti Sensory (Lat Mall)  33C  Calf    2.8 <4.6 9.6 >3   Motor Summary Table   Stim Site NR Onset (ms) Norm Onset (ms) O-P Amp (mV) Norm O-P Amp Site1 Site2 Delta-0 (ms) Dist (cm) Vel (m/s) Norm Vel (m/s)  Left Peroneal Motor (Ext Dig Brev)  33C  Ankle    3.7 <6.0 4.0 >2.5 B Fib Ankle 8.4 37.0 44 >40  B Fib    12.1  3.6  Poplt B Fib 1.7 8.0 47 >40  Poplt    13.8  3.4          Right Peroneal Motor (Ext Dig Brev)  33C  Ankle    3.7 <6.0 4.4 >2.5 B Fib Ankle 7.8 38.0 49 >40  B Fib    11.5  4.4  Poplt B Fib 1.6 9.0 56 >40  Poplt    13.1  4.4         Left Tibial Motor (Abd Hall Brev)  33C  Ankle    4.3 <6.0 6.7 >4 Knee Ankle 10.2 45.0 44 >40  Knee    14.5  5.1         Right Tibial Motor (Abd Hall Brev)  33C  Ankle    3.5 <6.0 8.8 >4 Knee Ankle 10.3 46.0 45 >40  Knee    13.8  5.4          H Reflex Studies   NR H-Lat (ms) Lat Norm (ms) L-R H-Lat (ms)  Left Tibial (Gastroc)  33C     34.42 <35 0.00  Right Tibial (Gastroc)  33C     34.42 <35 0.00   EMG   Side Muscle Ins Act Fibs Psw Fasc Number Recrt Dur Dur. Amp Amp. Poly Poly. Comment  Right AntTibialis Nml Nml Nml Nml Nml Nml Nml Nml Nml Nml Nml Nml N/A  Right Gastroc  Nml Nml Nml Nml Nml Nml Nml Nml Nml Nml Nml Nml N/A  Right RectFemoris Nml Nml Nml Nml Nml Nml Nml Nml Nml Nml Nml Nml N/A  Left AntTibialis Nml Nml Nml Nml Nml Nml Nml Nml Nml Nml Nml Nml N/A  Left Gastroc Nml Nml Nml Nml Nml Nml Nml Nml Nml Nml Nml Nml N/A  Left RectFemoris Nml Nml Nml Nml Nml Nml Nml Nml Nml Nml Nml Nml N/A      Waveforms:

## 2020-11-17 NOTE — Progress Notes (Signed)
He needs to follow up with Neurology for dx and tx of profound neuropathy to feet. His test was negative but obviously something is causing it.

## 2020-11-24 ENCOUNTER — Other Ambulatory Visit: Payer: Self-pay

## 2020-11-24 ENCOUNTER — Ambulatory Visit (INDEPENDENT_AMBULATORY_CARE_PROVIDER_SITE_OTHER): Payer: Medicare Other | Admitting: Orthotics

## 2020-11-24 ENCOUNTER — Telehealth: Payer: Self-pay | Admitting: *Deleted

## 2020-11-24 DIAGNOSIS — G5793 Unspecified mononeuropathy of bilateral lower limbs: Secondary | ICD-10-CM

## 2020-11-24 DIAGNOSIS — M722 Plantar fascial fibromatosis: Secondary | ICD-10-CM

## 2020-11-24 DIAGNOSIS — M778 Other enthesopathies, not elsewhere classified: Secondary | ICD-10-CM

## 2020-11-24 NOTE — Telephone Encounter (Signed)
Patient has an appointment for a neurology consult on March 3rd with Dr. Posey Pronto

## 2020-11-24 NOTE — Progress Notes (Signed)
Patient picked up f/o and was pleased with fit, comfort, and function.  Worked well with footwear.  Told of rbeak in period and how to report any issues.  

## 2020-12-01 ENCOUNTER — Ambulatory Visit (INDEPENDENT_AMBULATORY_CARE_PROVIDER_SITE_OTHER): Payer: Medicare Other | Admitting: *Deleted

## 2020-12-01 ENCOUNTER — Telehealth: Payer: Self-pay | Admitting: Podiatry

## 2020-12-01 DIAGNOSIS — I4891 Unspecified atrial fibrillation: Secondary | ICD-10-CM | POA: Diagnosis not present

## 2020-12-01 DIAGNOSIS — Z5181 Encounter for therapeutic drug level monitoring: Secondary | ICD-10-CM | POA: Diagnosis not present

## 2020-12-01 LAB — POCT INR: INR: 2.3 (ref 2.0–3.0)

## 2020-12-01 NOTE — Telephone Encounter (Signed)
Neurology test was negative. Referred from Dr. Milinda Pointer to get more testing completed at neurology if possible.

## 2020-12-01 NOTE — Patient Instructions (Signed)
Continue 1 tablet daily except 1/2 tablet on Mondays and Thursdays Continue greens Recheck in 6 weeks

## 2020-12-02 ENCOUNTER — Telehealth: Payer: Self-pay | Admitting: Podiatry

## 2020-12-02 DIAGNOSIS — G5793 Unspecified mononeuropathy of bilateral lower limbs: Secondary | ICD-10-CM

## 2020-12-02 NOTE — Telephone Encounter (Signed)
Patient was seen at [facility] for EMG testing by Dr. Morrie Sheldon and this patient needs some further testing due to him not having neuropathy. Wife would to know why his feet still are numb.Would you be able to send another referral so that he can see a neurologist?

## 2020-12-02 NOTE — Telephone Encounter (Signed)
Yes I have asked that he be seen by the neurologist.  His feet are profoundly numb and the nerve conduction velocity test was negative.  So I would like for the neurologist to evaluate him and see what other testing may need to be done.

## 2020-12-03 ENCOUNTER — Encounter: Payer: Self-pay | Admitting: Neurology

## 2020-12-03 NOTE — Telephone Encounter (Addendum)
Sent over referral for neurology to evaluate with further testing

## 2020-12-03 NOTE — Addendum Note (Signed)
Addended by: Rip Harbour on: 12/03/2020 08:25 AM   Modules accepted: Orders

## 2020-12-09 ENCOUNTER — Encounter: Payer: Medicare Other | Admitting: Neurology

## 2020-12-22 ENCOUNTER — Telehealth: Payer: Self-pay | Admitting: Emergency Medicine

## 2020-12-22 ENCOUNTER — Encounter: Payer: Self-pay | Admitting: *Deleted

## 2020-12-22 ENCOUNTER — Ambulatory Visit (INDEPENDENT_AMBULATORY_CARE_PROVIDER_SITE_OTHER): Payer: Medicare Other

## 2020-12-22 DIAGNOSIS — I495 Sick sinus syndrome: Secondary | ICD-10-CM

## 2020-12-22 DIAGNOSIS — I480 Paroxysmal atrial fibrillation: Secondary | ICD-10-CM

## 2020-12-22 DIAGNOSIS — Z7901 Long term (current) use of anticoagulants: Secondary | ICD-10-CM

## 2020-12-22 DIAGNOSIS — I10 Essential (primary) hypertension: Secondary | ICD-10-CM

## 2020-12-22 LAB — CUP PACEART REMOTE DEVICE CHECK
Battery Voltage: 2.79 V
Brady Statistic AP VP Percent: 1.29 %
Brady Statistic AP VS Percent: 6.15 %
Brady Statistic AS VP Percent: 42.08 %
Brady Statistic AS VS Percent: 50.49 %
Brady Statistic RA Percent Paced: 6.42 %
Brady Statistic RV Percent Paced: 42.69 %
Date Time Interrogation Session: 20220314210934
Implantable Lead Implant Date: 20120820
Implantable Lead Implant Date: 20120820
Implantable Lead Location: 753859
Implantable Lead Location: 753860
Implantable Pulse Generator Implant Date: 20120820
Lead Channel Impedance Value: 448 Ohm
Lead Channel Impedance Value: 448 Ohm
Lead Channel Sensing Intrinsic Amplitude: 6.38 mV
Lead Channel Sensing Intrinsic Amplitude: 6.908 mV
Lead Channel Setting Pacing Amplitude: 2.5 V
Lead Channel Setting Pacing Pulse Width: 0.6 ms
Lead Channel Setting Sensing Sensitivity: 0.9 mV

## 2020-12-22 NOTE — Telephone Encounter (Signed)
Monday did not work, but can do Wednesday 03/23 at 1330h

## 2020-12-22 NOTE — Telephone Encounter (Signed)
Trying to get him on schedule for Monday 03/21. He is not pacemaker dependent.

## 2020-12-22 NOTE — Telephone Encounter (Signed)
Patient contacted that pacemaker at [facility] since 09/17/21. Patient had not had transmission since 08/29/20. Patient had hand held monitor piece that was not working and Medtronic sent new one that arrived yesterday. Will contact Dr Sallyanne Kuster to get patient on schedule for gen change ASAP.

## 2020-12-22 NOTE — Telephone Encounter (Signed)
Spoke with the patient and the wife about the coming up procedure. Instructions will be mailed.    Generator Change Procedure Instructions  You are scheduled for a Generator Change (battery change) on  12/30/20  with Dr. Sallyanne Kuster.  1. Please arrive at the [facility], Entrance "A"  at [facility] at  11:30 am on the day of your procedure. (The address is [address])  2. DIET: You may have a light, early breakfast the morning of your procedure. NOTHING TO EAT AFTER 8:00 AM.  3. LABS: Your provider would like for you to return on 12/24/20 to have the following labs drawn: BMET, CBC and PT INR. You do not need an appointment for the lab. Once in our office lobby there is a podium where you can sign in and ring the doorbell to alert [name] that you are here. The lab is open from 8:00 am to 4:30 pm; closed for lunch from 12:45pm-1:45pm. You do not need to be fasting.   You will need to have the coronavirus test completed prior to your procedure. An appointment has been made at 9 am on 12/28/20. This is a Drive Up Visit at [facility]. Please tell them that you are there for procedure testing. Stay in your car and someone will be with you shortly. Please make sure to have all other labs completed before this test because you will need to stay quarantined until your procedure.   4. MEDICATIONS:  Hold the warfarin 3 days prior (Sunday, Monday and Tuesday)  5.  Plan for an overnight stay.  Bring your insurance cards and a list of you medications.  6.  Wash your chest and neck with surgical scrub the evening before and the morning of your procedure.  Rinse well. Please review the surgical scrub instruction sheet given to you.   7. Your chest will need to be shaved prior to this procedure (if needed). We ask that you do this yourself at home 1 to 2 days before or if uncomfortable/unable to do yourself, then it will be performed by the hospital staff the day of.  * Special note:   Every effort is made to have your procedure done on time.  Occasionally there are emergencies that present themselves at the hospital that may cause delays.  Please be patient if a delay does occur.                                                                                                           * If you have any questions after you get home, please call Lattie Haw, RN at [phone].    [place] - Preparing For Surgery  Before surgery, you can play an important role. Because skin is not sterile, your skin needs to be as free of germs as possible. You can reduce the number of germs on your skin by washing with CHG (chlorahexidine gluconate) Soap before surgery.  CHG is an antiseptic cleaner which kills germs and bonds with the skin to continue killing  germs even after washing.   Please do not use if you have an allergy to CHG or antibacterial soaps.  If your skin becomes reddened/irritated stop using the CHG.   Do not shave (including legs and underarms) for at least 48 hours prior to first CHG shower.  It is OK to shave your face.  Please follow these instructions carefully:  1.  Shower the night before surgery and the morning of surgery with CHG.  2.  If you choose to wash your hair, wash your hair first as usual with your normal shampoo.  3.  After you shampoo, rinse your hair and body thoroughly to remove the shampoo.  4.  Use CHG as you would any other liquid soap.  You can apply CHG directly to the skin and wash gently with a clean washcloth. 5.  Apply the CHG Soap to your body ONLY FROM THE NECK DOWN.  Do not use on open wounds or open sores.  Avoid contact with your eyes, ears, mouth and genitals (private parts).    6.  Wash thoroughly, paying special attention to the area where your surgery will be performed.  7.  Thoroughly rinse your body with warm water from the neck down.   8.  DO NOT shower/wash with your normal soap after using and rinsing off the CHG soap.  9.  Pat  yourself dry with a clean towel.   10.  Wear clean pajamas.   11.  Place clean sheets on your bed the night of your first shower and do not sleep with pets.  Day of Surgery: Do not apply any deodorants/lotions.  Please wear clean clothes to the hospital/surgery center.

## 2020-12-24 DIAGNOSIS — I480 Paroxysmal atrial fibrillation: Secondary | ICD-10-CM | POA: Diagnosis not present

## 2020-12-24 DIAGNOSIS — I10 Essential (primary) hypertension: Secondary | ICD-10-CM | POA: Diagnosis not present

## 2020-12-24 DIAGNOSIS — Z7901 Long term (current) use of anticoagulants: Secondary | ICD-10-CM | POA: Diagnosis not present

## 2020-12-25 ENCOUNTER — Telehealth: Payer: Self-pay | Admitting: Cardiovascular Disease

## 2020-12-25 LAB — BASIC METABOLIC PANEL
BUN/Creatinine Ratio: 11 (ref 10–24)
BUN: 15 mg/dL (ref 8–27)
CO2: 24 mmol/L (ref 20–29)
Calcium: 9.7 mg/dL (ref 8.6–10.2)
Chloride: 104 mmol/L (ref 96–106)
Creatinine, Ser: 1.36 mg/dL — ABNORMAL HIGH (ref 0.76–1.27)
Glucose: 108 mg/dL — ABNORMAL HIGH (ref 65–99)
Potassium: 3.7 mmol/L (ref 3.5–5.2)
Sodium: 142 mmol/L (ref 134–144)
eGFR: 57 mL/min/{1.73_m2} — ABNORMAL LOW (ref >59)

## 2020-12-25 LAB — CBC
Hematocrit: 45.7 % (ref 37.5–51.0)
Hemoglobin: 15.6 g/dL (ref 13.0–17.7)
MCH: 31.6 pg (ref 26.6–33.0)
MCHC: 34.1 g/dL (ref 31.5–35.7)
MCV: 93 fL (ref 79–97)
Platelets: 133 10*3/uL — ABNORMAL LOW (ref 150–450)
RBC: 4.94 x10E6/uL (ref 4.14–5.80)
RDW: 12.8 % (ref 11.6–15.4)
WBC: 6.3 10*3/uL (ref 3.4–10.8)

## 2020-12-25 LAB — PROTIME-INR
INR: 2.8 — ABNORMAL HIGH (ref 0.9–1.2)
Prothrombin Time: 27.9 s — ABNORMAL HIGH (ref 9.1–12.0)

## 2020-12-25 NOTE — Telephone Encounter (Signed)
    Pt's wife calling, she would like to speak with Lattie Haw, she said she called [facility] and was told that pt is not scheduled for covid test there and pt should go to [facility] to get covid test

## 2020-12-25 NOTE — Telephone Encounter (Signed)
Spoke with pt wife, aware he is scheduled and they go into the main entrance to the hospital, tell them he is there for covid testing and they will tell him where to go inside the hospital.

## 2020-12-27 ENCOUNTER — Other Ambulatory Visit: Payer: Self-pay | Admitting: *Deleted

## 2020-12-27 DIAGNOSIS — I495 Sick sinus syndrome: Secondary | ICD-10-CM

## 2020-12-28 ENCOUNTER — Other Ambulatory Visit (HOSPITAL_COMMUNITY)
Admission: RE | Admit: 2020-12-28 | Discharge: 2020-12-28 | Disposition: A | Discharge status: Home / Self Care | Payer: Medicare Other | Source: Ambulatory Visit | Attending: Cardiovascular Disease | Admitting: Cardiovascular Disease

## 2020-12-28 ENCOUNTER — Other Ambulatory Visit: Payer: Self-pay

## 2020-12-28 DIAGNOSIS — Z01812 Encounter for preprocedural laboratory examination: Secondary | ICD-10-CM | POA: Diagnosis not present

## 2020-12-28 DIAGNOSIS — Z20822 Contact with and (suspected) exposure to covid-19: Secondary | ICD-10-CM | POA: Diagnosis not present

## 2020-12-28 NOTE — Progress Notes (Signed)
I had to renter patient's Covid order, the original order was canceled. I forgot to put anything in the comments box.  I put the order in again, released it, printed the requisition and took specimen to the lab. I didn't call patient's Dr.'s office since more than likely, they put the first order in. I'm hoping they'll see this note.

## 2020-12-29 LAB — SARS CORONAVIRUS 2 (TAT 6-24 HRS): SARS Coronavirus 2: NEGATIVE

## 2020-12-30 ENCOUNTER — Ambulatory Visit (HOSPITAL_COMMUNITY)
Admission: RE | Disposition: A | Discharge status: Home / Self Care | Payer: Self-pay | Source: Home / Self Care | Attending: Cardiovascular Disease

## 2020-12-30 ENCOUNTER — Ambulatory Visit (HOSPITAL_COMMUNITY)
Admission: RE | Admit: 2020-12-30 | Discharge: 2020-12-30 | Disposition: A | Discharge status: Home / Self Care | Payer: Medicare Other | Attending: Cardiovascular Disease | Admitting: Cardiovascular Disease

## 2020-12-30 ENCOUNTER — Telehealth: Payer: Self-pay | Admitting: *Deleted

## 2020-12-30 DIAGNOSIS — I714 Abdominal aortic aneurysm, without rupture: Secondary | ICD-10-CM | POA: Insufficient documentation

## 2020-12-30 DIAGNOSIS — Z79899 Other long term (current) drug therapy: Secondary | ICD-10-CM | POA: Insufficient documentation

## 2020-12-30 DIAGNOSIS — Z8249 Family history of ischemic heart disease and other diseases of the circulatory system: Secondary | ICD-10-CM | POA: Diagnosis not present

## 2020-12-30 DIAGNOSIS — I10 Essential (primary) hypertension: Secondary | ICD-10-CM | POA: Insufficient documentation

## 2020-12-30 DIAGNOSIS — I480 Paroxysmal atrial fibrillation: Secondary | ICD-10-CM | POA: Diagnosis not present

## 2020-12-30 DIAGNOSIS — Z45010 Encounter for checking and testing of cardiac pacemaker pulse generator [battery]: Secondary | ICD-10-CM | POA: Diagnosis not present

## 2020-12-30 DIAGNOSIS — R55 Syncope and collapse: Secondary | ICD-10-CM | POA: Insufficient documentation

## 2020-12-30 DIAGNOSIS — I495 Sick sinus syndrome: Secondary | ICD-10-CM | POA: Diagnosis not present

## 2020-12-30 DIAGNOSIS — I712 Thoracic aortic aneurysm, without rupture: Secondary | ICD-10-CM | POA: Diagnosis not present

## 2020-12-30 DIAGNOSIS — Z7901 Long term (current) use of anticoagulants: Secondary | ICD-10-CM | POA: Diagnosis not present

## 2020-12-30 DIAGNOSIS — E785 Hyperlipidemia, unspecified: Secondary | ICD-10-CM | POA: Diagnosis not present

## 2020-12-30 HISTORY — PX: PPM GENERATOR CHANGEOUT: EP1233

## 2020-12-30 LAB — PROTIME-INR
INR: 1.6 — ABNORMAL HIGH (ref 0.8–1.2)
Prothrombin Time: 18.3 seconds — ABNORMAL HIGH (ref 11.4–15.2)

## 2020-12-30 SURGERY — PPM GENERATOR CHANGEOUT

## 2020-12-30 MED ORDER — ONDANSETRON HCL 4 MG/2ML IJ SOLN: 4.0000 mg | Freq: Four times a day (QID) | INTRAMUSCULAR | Status: DC | PRN

## 2020-12-30 MED ORDER — SODIUM CHLORIDE 0.9% FLUSH: 3.0000 mL | INTRAVENOUS | Status: DC | PRN

## 2020-12-30 MED ORDER — LIDOCAINE HCL (PF) 1 % IJ SOLN
INTRAMUSCULAR | Status: AC
  Filled 2020-12-30: qty 60

## 2020-12-30 MED ORDER — SODIUM CHLORIDE 0.9 % IV SOLN
INTRAVENOUS | Status: AC
  Filled 2020-12-30: qty 2

## 2020-12-30 MED ORDER — SODIUM CHLORIDE 0.9 % IV SOLN
80.0000 mg | INTRAVENOUS | Status: AC
  Administered 2020-12-30: 80 mg

## 2020-12-30 MED ORDER — CEFAZOLIN SODIUM-DEXTROSE 2-4 GM/100ML-% IV SOLN
2.0000 g | INTRAVENOUS | Status: AC
  Administered 2020-12-30: 2 g via INTRAVENOUS

## 2020-12-30 MED ORDER — SODIUM CHLORIDE 0.9 % IV SOLN: INTRAVENOUS | Status: DC

## 2020-12-30 MED ORDER — LIDOCAINE HCL (PF) 1 % IJ SOLN
INTRAMUSCULAR | Status: DC | PRN
  Administered 2020-12-30: 30 mL

## 2020-12-30 MED ORDER — SODIUM CHLORIDE 0.9% FLUSH: 3.0000 mL | Freq: Two times a day (BID) | INTRAVENOUS | Status: DC

## 2020-12-30 MED ORDER — CEFAZOLIN SODIUM-DEXTROSE 2-4 GM/100ML-% IV SOLN
INTRAVENOUS | Status: AC
  Filled 2020-12-30: qty 100

## 2020-12-30 MED ORDER — ACETAMINOPHEN 325 MG PO TABS
325.0000 mg | ORAL_TABLET | ORAL | Status: DC | PRN
  Filled 2020-12-30: qty 2

## 2020-12-30 MED ORDER — MIDAZOLAM HCL 5 MG/5ML IJ SOLN
INTRAMUSCULAR | Status: AC
  Filled 2020-12-30: qty 5

## 2020-12-30 MED ORDER — CHLORHEXIDINE GLUCONATE 4 % EX LIQD
4.0000 "application " | Freq: Once | CUTANEOUS | Status: DC
  Filled 2020-12-30: qty 60

## 2020-12-30 MED ORDER — SODIUM CHLORIDE 0.9 % IV SOLN: 250.0000 mL | INTRAVENOUS | Status: DC | PRN

## 2020-12-30 MED ORDER — FENTANYL CITRATE (PF) 100 MCG/2ML IJ SOLN
INTRAMUSCULAR | Status: AC
  Filled 2020-12-30: qty 2

## 2020-12-30 SURGICAL SUPPLY — 5 items
CABLE SURGICAL S-101-97-12 (CABLE) ×2 IMPLANT
ELECT DEFIB PAD ADLT CADENCE (PAD) ×1 IMPLANT
IPG PACE AZUR XT DR MRI W1DR01 (Pacemaker) IMPLANT
PACE AZURE XT DR MRI W1DR01 (Pacemaker) ×2 IMPLANT
TRAY PACEMAKER INSERTION (PACKS) ×2 IMPLANT

## 2020-12-30 NOTE — H&P (Signed)
Cardiology Admission History and Physical:   Patient ID: Alex Maddox MRN: [id number]; DOB: November 25, 1950   Admission date: 12/30/2020  PCP:  [facility], [place]  Cardiologist:  No primary care provider on file. Haygen Zebrowski Advanced Practice Provider:  No care team member to display Electrophysiologist:  None        Chief Complaint: Pacemaker generator at end of service  Patient Profile:   Alex Maddox is a 70 y.o. male with tachycardia-bradycardia syndrome, paroxysmal atrial fibrillation, neurocardiogenic syncope, history of left ventricular systolic dysfunction without clinical heart failure, thoracic and abdominal aortic aneurysm, essential hypertension, hyperlipidemia whose pacemaker has reached end of service.  History of Present Illness:   Alex Maddox has no active cardiovascular complaints.  He has not had recent syncope.  He denies problems with edema, orthopnea, PND, palpitations and is generally unaware of his episodes of atrial fibrillation.  He is compliant with warfarin anticoagulation and prothrombin time monitoring and denies falls, injuries or serious bleeding problems.  His pacemaker generator reached ERI in December.  He is here for generator change out.  He is not pacemaker dependent.  He typically has about 50% atrial pacing and very infrequent need for ventricular pacing.  He has had a slowly increasing burden of atrial fibrillation over the years, now up to 10-15%.  Although he has occasional rapid ventricular rates, for the most part ventricular rate control is good.  Most recent evaluation of his aortic aneurysm as well as by CT angiography 05/24/2019: Ascending aorta 4.2 cm, proximal arch 3.9 cm, normal descending aorta to the level of the infrarenal AAA measuring 2.8 x 3.0 cm.  Incidental note of right renal artery 40-50% ostial stenosis due to calcified plaque.  He has a history of neurally mediated syncope with both  cardioinhibitory and vasodepressor components and received a dual-chamber Medtronic Revo MRI conditional permanent pacemaker in August 2012. He has a history of paroxysmal atrial fibrillation and a history of TIA at age 2. He had a traumatic right occipital intracerebral hemorrhage while on warfarin anticoagulation in November 2016. He also has thoracic ascending aortic aneurysm, infrarenal AAA, systemic hypertension and hyperlipidemia. Normal nuclear stress test perfusion pattern in 2012, EF 43%; similar EF by echocardiography. Echo images are very difficult and follow-up echo in 2015 could not quantify EF. He has never had clinical heart failure. He takes a statin for hyperlipidemia as well as valsartan and carvedilol for both the arrhythmia, the HTN and the cardiomyopathy. He has ascending aortic enlargement (4.2 cm) and fusiform ectasia of the infrarenal abdominal aorta (max diam 3.0 cm) and CT evidence of coronary calcification.  Past Medical History:  Diagnosis Date  . Abnormal echocardiogram    stress myoview 10/29/2010  . Arthritis    " IN MY BACK "  . Dyslipidemia   . Dysrhythmia    Atrial fibrillation  . Hypertension   . Paroxysmal atrial fibrillation (HCC)   . Presence of permanent cardiac pacemaker   . Sinus node dysfunction (HCC)   . Sleep apnea, obstructive    sleep study 10/09/2007  AHI 5.73/hr  REM AHI 11.90/hr   . 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   LEFT THUMB   . Stroke ([place])   . Syncope    2D Echocardiogram 09/27/2010 EF between 40 to 45%  . Tachycardia-bradycardia ([place])   . Transient ischemic attack (TIA) 06/07    Past Surgical History:  Procedure Laterality Date  . COLONOSCOPY    .  COLONOSCOPY N/A 06/07/2017   Procedure: COLONOSCOPY;  Surgeon: Rogene Houston, MD;  [facility];  Service: Endoscopy;  Laterality: N/A;  9:30  . COLONOSCOPY N/A 09/20/2018   Procedure: COLONOSCOPY;  Surgeon: Rogene Houston, MD;  Location: [facility] SU[facility];   Service: Endoscopy;  Laterality: N/A;  1030  . ELECTROCARDIOGRAM    . INSERT / REPLACE / REMOVE PACEMAKER  05/2011  . LOOP RECORDER IMPLANT  02/02/2011   Medtronic- CRM   . PACEMAKER INSERTION  05/30/2011   Medtronic Revo   last checked 11/07/2012  . POLYPECTOMY  09/20/2018   Procedure: POLYPECTOMY;  Surgeon: Rogene Houston, MD;  Location: [facility];  Service: Endoscopy;;  colon     Medications Prior to Admission: Prior to Admission medications   Medication Sig Start Date End Date Taking? Authorizing Provider  carvedilol (COREG) 25 MG tablet TAKE ONE TABLET (25MG  TOTAL) BY MOUTH TWO TIMES DAILY WITH A MEAL Patient taking differently: Take 25 mg by mouth 2 (two) times daily with a meal. 08/10/20  Yes Hochrein, Jeneen Rinks, MD  diazepam (VALIUM) 2 MG tablet Take 2 mg by mouth every 8 (eight) hours as needed for anxiety.    Yes [provider]  finasteride (PROSCAR) 5 MG tablet Take 5 mg by mouth daily.   Yes [provider]  Flaxseed Oil OIL Take 1,000 mg by mouth daily.   Yes [provider]  irbesartan (AVAPRO) 300 MG tablet TAKE ONE (1) TABLET BY MOUTH EVERY DAY Patient taking differently: Take 300 mg by mouth daily. 08/24/20  Yes Alisabeth Selkirk, MD  Misc Natural Products (NEURIVA) CAPS Take 1 capsule by mouth daily. For memory   Yes [provider]  Multiple Vitamin (MULTIVITAMIN WITH MINERALS) TABS tablet Take 1 tablet by mouth daily. Adult Multivitamin 50+   Yes [provider]  Omega-3 Fatty Acids (FISH OIL) 1000 MG CAPS Take 1,000 mg by mouth daily.   Yes [provider]  Polyethyl Glycol-Propyl Glycol 0.4-0.3 % SOLN Place 1 drop into both eyes 3 (three) times daily as needed (for dry eyes.).   Yes [provider]  rosuvastatin (CRESTOR) 20 MG tablet TAKE ONE TABLET (20MG  TOTAL) BY MOUTH DAILY Patient taking differently: Take 20 mg by mouth daily. 08/04/20  Yes Justiss Gerbino, MD  Saw Palmetto 450 MG CAPS Take 900 mg by  mouth daily.   Yes [provider]  trospium (SANCTURA) 20 MG tablet Take 20 mg by mouth daily.   Yes [provider]  warfarin (COUMADIN) 5 MG tablet TAKE ONE TABLET BY MOUTH EVERY DAY AS DIRECTED BY COUMADIN CLINIC Patient taking differently: Take 2.5-5 mg by mouth See admin instructions. 2.5 mg Mon and Thurs; 5 mg all other days 09/07/20  Yes Ettore Trebilcock, MD     Allergies:   No Known Allergies  Social History:   Social History   Socioeconomic History  . Marital status: Married    Spouse name: Not on file  . Number of children: 0  . Years of education: Not on file  . Highest education level: Not on file  Occupational History  . Not on file  Tobacco Use  . Smoking status: Never Smoker  . Smokeless tobacco: Never Used  Vaping Use  . Vaping Use: Never used  Substance and Sexual Activity  . Alcohol use: Yes    Comment: occasionally  . Drug use: No  . Sexual activity: Not on file  Other Topics Concern  . 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   Social Determinants of Health   Financial Resource Strain: Not on file  Food Insecurity: Not on file  Transportation Needs: Not on file  Physical Activity: Not on file  Stress: Not on file  Social Connections: Not on file  Intimate Partner Violence: Not on file    Family History:   The patient's family history includes Heart attack (age of onset: 49) in his father; Hypertension in his brother and mother. There is no history of Colon cancer.    ROS:  Please see the history of present illness.  All other ROS reviewed and negative.     Physical Exam/Data:   Vitals:   12/30/20 1141  BP: (!) 113/95  Pulse: 64  Temp: 97.6 F (36.4 C)  TempSrc: Oral  SpO2: 100%  Weight: 81.6 kg  Height: 6' (1.829 m)   No intake or output data in the 24 hours ending 12/30/20 1211 Last 3 Weights 12/30/2020 02/17/2020 08/09/2019  Weight (lbs) 180 lb 196 lb 9.6 oz 194 lb 3.2  oz  Weight (kg) 81.647 kg 89.177 kg 88.089 kg     Body mass index is 24.41 kg/m.  General:  Well nourished, well developed, in no acute distress.  Healthy left subclavian pacemaker site HEENT: normal Lymph: no adenopathy Neck: no JVD Endocrine:  No thryomegaly Vascular: No carotid bruits; FA pulses 2+ bilaterally without bruits  Cardiac:  normal S1, widely split S2; RRR; no murmur  Lungs:  clear to auscultation bilaterally, no wheezing, rhonchi or rales  Abd: soft, nontender, no hepatomegaly  Ext: no edema Musculoskeletal:  No deformities, BUE and BLE strength normal and equal Skin: warm and dry  Neuro:  CNs 2-12 intact, no focal abnormalities noted Psych:  Normal affect    EKG:  The ECG that was done  was personally reviewed and demonstrates asynchronous VVI pacing at 65 bpm with pseudofusion with sinus rhythm at similar rate, RBBB during sinus beats Relevant CV Studies:   Laboratory Data:  High Sensitivity Troponin:  No results for input(s): TROPONINIHS in the last 720 hours.    Chemistry Recent Labs  Lab 12/24/20 1606  NA 142  K 3.7  CL 104  CO2 24  GLUCOSE 108*  BUN 15  CREATININE 1.36*  CALCIUM 9.7    No results for input(s): PROT, ALBUMIN, AST, ALT, ALKPHOS, BILITOT in the last 168 hours. Hematology Recent Labs  Lab 12/24/20 1606  WBC 6.3  RBC 4.94  HGB 15.6  HCT 45.7  MCV 93  MCH 31.6  MCHC 34.1  RDW 12.8  PLT 133*   BNPNo results for input(s): BNP, PROBNP in the last 168 hours.  DDimer No results for input(s): DDIMER in the last 168 hours.   Radiology/Studies:  No results found.   Assessment and Plan:   1. Pacemaker battery depletion: Pacemaker generator change of procedure discussed in detail.  He understands and agrees to proceed. 2. Paroxysmal atrial fibrillation: Burden has increased steadily over the last few years now, 10-15% of the time, both with good ventricular rate control and completely asymptomatic.  CHA2DS2-VASc 6 (age, TIA 2,  hypertension, vascular disease, LV dysfunction) anticoagulant. 3. Tachycardia-bradycardia syndrome: Not pacemaker dependent.  Roughly 50% atrial pacing. 4. Neurocardiogenic syncope: Frequency of syncopal events has diminished since pacemaker implantation, but he has had at least one episode of vasodepressive near-syncope in the last couple of years.  Avoid potent vasodilators, preferentially his beta-blockers for  rate control and hypertension treatment. 5. Warfarin: INR is virtually always in therapeutic range.  Has a remote history of TIA, although it was never clear whether this was related to atrial fibrillation. 6. Left ventricular systolic dysfunction: Most recent estimation of LVEF 43% by nuclear stress testing in 2012, similar by echo 2015.  Has never had manifestations of heart failure.  He is on ARB and beta-blocker therapy.  It is probably time for [name] to reassess LV function even if he is asymptomatic. 7. Aneurysm of the ascending aorta and AAA: Both aneurysms were stable in size by CT last performed August 2020, will repeat on a every other year basis.  Will be due for repeat scan later this year. 8. HTN: Usually adequate control.  Suspect the current recorded diastolic blood pressure is artifactual.  Will reassess throughout his procedure. 9. HLP: No known PAD or CAD.  Most recent LDL cholesterol 96, ideally less than 70.  Continues to have a borderline low HDL cholesterol.  Encourage physical exercise.  Note mildly elevated creatinine, unsure if this is intimately related to volume status or reduced cardiac output from asynchronous ventricular pacing.  Reevaluate in a few weeks, also recheck his lipid profile.  This procedure has been fully reviewed with the patient and written informed consent has been obtained.   For questions or updates, please contact [place] Please consult [email or web address] for contact info under     Signed, Sanda Klein, MD  12/30/2020 12:11 PM

## 2020-12-30 NOTE — Telephone Encounter (Signed)
-----   Message from Sanda Klein, MD sent at 12/30/2020  3:27 PM EDT ----- Alex Maddox Re! Can we please get him in a little sooner for his INR? I had him hold warfarin for 3 days for a pacemaker procedure and his INR was 1.6 today. Will restart warfarin tonight.  Hello, Alex Maddox! Alex Maddox needs a CT angio of chest + abdomen and pelvis for aneurysms of the ascending aorta and abdominal aorta. Please schedule in May or June, before his f/u visit w me in July. Please order a lipid profile to be drawn at same time as his pre-CT BMET, also.   Thanking all Lisas!

## 2020-12-30 NOTE — Op Note (Signed)
Procedure report  Procedure performed:  1. Dual chamber pacemaker generator changeout   Reason for procedure:  1. Device generator at elective replacement interval  2. Tachycardia-bradycardia sd. 3. Neurocardiogenic syncope Procedure performed by:  Sanda Klein, MD  Complications:  None  Estimated blood loss:  <5 mL  Medications administered during procedure:  Ancef 2 g intravenously, lidocaine 1% 30 mL locally Device details:   New Generator Medtronic Azure XT DR,  model number [id number], serial number  [id number] Right atrial lead (chronic) Medtronic, model number [id number], serial number [id number] (implanted 05/30/2011) Right ventricular lead (chronic)  Medtronic, model number [id number], serial number [id number] (implanted 05/30/2011)  Explanted generator Medtronic Revo model number [id number] (implanted 05/30/2011)  Procedure details:  After the risks and benefits of the procedure were discussed the patient provided informed consent. She was brought to the cardiac catheter lab in the fasting state. The patient was prepped and draped in usual sterile fashion. Local anesthesia with 1% lidocaine was administered to to the left infraclavicular area. A 5-6cm horizontal incision was made parallel with and 2-3 cm caudal to the right clavicle, in the area of an old scar. Using minimal electrocautery and mostly sharp and blunt dissection the prepectoral pocket was opened carefully to avoid injury to the loops of chronic leads. Extensive dissection was not necessary. The device was explanted. The pocket was carefully inspected for hemostasis and flushed with copious amounts of antibiotic solution.  The leads were disconnected from the old generator and connected to the new generator , with appropriate pacing noted.  Telemetry testing of the lead parameters showed stable values.   The entire system was then carefully inserted in the pocket with care been taking that the leads and device assumed a comfortable  position without pressure on the incision. Great care was taken that the leads be located deep to the generator. The pocket was then closed in layers using 2 layers of 2-0 Vicryl, one layer of 3-0 Vicryl and cutaneous steristrips after which a sterile dressing was applied.   At the end of the procedure the following lead parameters were encountered:   Right atrial lead sensed P waves 8.1 mV, impedance 456 ohms, threshold 0.75 at 0.6 ms pulse width.  Right ventricular lead sensed R waves  7.4 mV, impedance 437 ohms, threshold 1.25 at 0.6 ms pulse width.  Sanda Klein, MD, [facility] [phone] office [phone] pager

## 2020-12-30 NOTE — Telephone Encounter (Signed)
Called the pt since we received a message that the pt needs his INR checked sooner per Dr. Sallyanne Kuster; there was no answer so left a message for the pt to call back.

## 2020-12-30 NOTE — Discharge Instructions (Signed)

## 2020-12-30 NOTE — Progress Notes (Signed)
Remote pacemaker transmission.   

## 2020-12-31 ENCOUNTER — Encounter (HOSPITAL_COMMUNITY): Payer: Self-pay | Admitting: Cardiovascular Disease

## 2020-12-31 MED FILL — Midazolam HCl Inj 5 MG/5ML (Base Equivalent): INTRAMUSCULAR | Qty: 5 | Status: AC

## 2020-12-31 MED FILL — Fentanyl Citrate Preservative Free (PF) Inj 100 MCG/2ML: INTRAMUSCULAR | Qty: 2 | Status: AC

## 2020-12-31 NOTE — Telephone Encounter (Signed)
Wife called back and able to set an Anticoagulation Appt for the pt to come to an appt on Monday, 01/04/21 in [place].

## 2021-01-04 ENCOUNTER — Other Ambulatory Visit: Payer: Self-pay

## 2021-01-04 ENCOUNTER — Ambulatory Visit (INDEPENDENT_AMBULATORY_CARE_PROVIDER_SITE_OTHER): Payer: Medicare Other | Admitting: *Deleted

## 2021-01-04 DIAGNOSIS — I4891 Unspecified atrial fibrillation: Secondary | ICD-10-CM

## 2021-01-04 DIAGNOSIS — Z5181 Encounter for therapeutic drug level monitoring: Secondary | ICD-10-CM | POA: Diagnosis not present

## 2021-01-04 LAB — POCT INR: INR: 2 (ref 2.0–3.0)

## 2021-01-04 NOTE — Patient Instructions (Signed)
Continue 1 tablet daily except 1/2 tablet on Mondays and Thursdays Continue greens Recheck in 4 weeks  Leaving on cruise 5/2

## 2021-01-06 DIAGNOSIS — I480 Paroxysmal atrial fibrillation: Secondary | ICD-10-CM | POA: Diagnosis not present

## 2021-01-06 DIAGNOSIS — I10 Essential (primary) hypertension: Secondary | ICD-10-CM | POA: Diagnosis not present

## 2021-01-06 DIAGNOSIS — E7849 Other hyperlipidemia: Secondary | ICD-10-CM | POA: Diagnosis not present

## 2021-01-08 DIAGNOSIS — N1831 Chronic kidney disease, stage 3a: Secondary | ICD-10-CM | POA: Diagnosis not present

## 2021-01-08 DIAGNOSIS — R809 Proteinuria, unspecified: Secondary | ICD-10-CM | POA: Diagnosis not present

## 2021-01-08 DIAGNOSIS — E870 Hyperosmolality and hypernatremia: Secondary | ICD-10-CM | POA: Diagnosis not present

## 2021-01-08 DIAGNOSIS — I952 Hypotension due to drugs: Secondary | ICD-10-CM | POA: Diagnosis not present

## 2021-01-08 DIAGNOSIS — D696 Thrombocytopenia, unspecified: Secondary | ICD-10-CM | POA: Diagnosis not present

## 2021-01-11 ENCOUNTER — Telehealth: Payer: Self-pay | Admitting: Cardiovascular Disease

## 2021-01-11 DIAGNOSIS — I10 Essential (primary) hypertension: Secondary | ICD-10-CM

## 2021-01-11 MED ORDER — AMLODIPINE BESYLATE 5 MG PO TABS: 5.0000 mg | ORAL_TABLET | Freq: Every day | ORAL | 3 refills | Status: DC

## 2021-01-11 NOTE — Telephone Encounter (Signed)
Spoke with pt wife, aware of dr croirotu's recommendations. New script sent to the pharmacy

## 2021-01-11 NOTE — Telephone Encounter (Signed)
Please add amlodipine 5 mg daily. Continue carvedilol and irbesartan. Please schedule for renal artery duplex US for possible renal artery stenosis/renovascular hypertension.

## 2021-01-11 NOTE — Telephone Encounter (Signed)
  1. Has your device fired? No   2. Is you device beeping? No   3. Are you experiencing draining or swelling at device site? No   4. Are you calling to see if we received your device transmission? No   5. Have you passed out? No  Is calling to inform office wife's phone is not compatible to send in transmission readings due to battery change. Is being sent a new monitor but they are on back order.     Please route to [place]

## 2021-01-11 NOTE — Addendum Note (Signed)
Addended by: Cristopher Estimable on: 01/11/2021 02:14 PM   Modules accepted: Orders

## 2021-01-11 NOTE — Telephone Encounter (Signed)
Spoke with pt, he is calling concerned because of how his bp is running. He reports it started prior to the battery change with his pacer but is getting worse. Medications confirmed.he also reports he passed out last week. He reports EMS checked him out and everything was okay. He wants to know what to do about the elevated bp. Aware will forward to dr croitoru to review and advise.

## 2021-01-11 NOTE — Telephone Encounter (Signed)
Spoke with pt wife, renal dopplers scheduled.

## 2021-01-11 NOTE — Telephone Encounter (Signed)
Pt c/o BP issue: STAT if pt c/o blurred vision, one-sided weakness or slurred speech  1. What are your last 5 BP readings?  01/11/21 158/102  01/10/21 169/110 159/103 01/09/21  143/90 160/98  2. Are you having any other symptoms (ex. Dizziness, headache, blurred vision, passed out)? Not feeling well, occasional dizziness, passed out Friday 01/01/21 after pacemaker from BP plummeting   3. What is your BP issue? BP has been running high    Pt c/o Syncope: STAT if syncope occurred within 30 minutes and pt complains of lightheadedness High Priority if episode of passing out, completely, today or in last 24 hours   1. Did you pass out today? No   2. When is the last time you passed out? 01/01/21  3. Has this occurred multiple times? Occurred 10-12 months prior   4. Did you have any symptoms prior to passing out? Felt it coming, didn't feel good, started sweating profusely. Passed out while in wheelchair, laid him on a bed & pulled knees to chest in 5-6 minutes he came to.

## 2021-01-13 NOTE — Telephone Encounter (Signed)
I left a message letting the patient know we got his message about the company sending him a new monitor.

## 2021-01-14 ENCOUNTER — Other Ambulatory Visit: Payer: Self-pay

## 2021-01-14 ENCOUNTER — Ambulatory Visit (INDEPENDENT_AMBULATORY_CARE_PROVIDER_SITE_OTHER): Payer: Medicare Other | Admitting: Emergency Medicine

## 2021-01-14 DIAGNOSIS — I495 Sick sinus syndrome: Secondary | ICD-10-CM

## 2021-01-14 NOTE — Progress Notes (Signed)
Wound check appointment. Steri-strips removed. Wound without redness or edema. Incision edges approximated, wound well healed. Normal device function. Thresholds, sensing, and impedances consistent with implant measurements. On interrigation noted device programmed at 3.5V at implant however today reduced output to 2.0/2.5V for appropriate safety margins in chronic lead. AT/AF burden 15.3% 12/30/20. On [place]. Max A/V rate 600/154 during episodes. Histogram distribution appropriate for patient and level of activity. Patient enrolled in remote monitoring however awaiting transmitter from [employer]. 91 day post gen change appointment with Dr. Sallyanne Kuster 04/19/21.

## 2021-01-15 ENCOUNTER — Ambulatory Visit (HOSPITAL_COMMUNITY)
Admission: RE | Admit: 2021-01-15 | Discharge: 2021-01-15 | Disposition: A | Discharge status: Home / Self Care | Payer: Medicare Other | Source: Ambulatory Visit | Attending: Cardiovascular Disease | Admitting: Cardiovascular Disease

## 2021-01-15 DIAGNOSIS — D696 Thrombocytopenia, unspecified: Secondary | ICD-10-CM | POA: Diagnosis not present

## 2021-01-15 DIAGNOSIS — E870 Hyperosmolality and hypernatremia: Secondary | ICD-10-CM | POA: Diagnosis not present

## 2021-01-15 DIAGNOSIS — N182 Chronic kidney disease, stage 2 (mild): Secondary | ICD-10-CM | POA: Diagnosis not present

## 2021-01-15 DIAGNOSIS — I129 Hypertensive chronic kidney disease with stage 1 through stage 4 chronic kidney disease, or unspecified chronic kidney disease: Secondary | ICD-10-CM | POA: Diagnosis not present

## 2021-01-15 DIAGNOSIS — I10 Essential (primary) hypertension: Secondary | ICD-10-CM

## 2021-01-15 DIAGNOSIS — R809 Proteinuria, unspecified: Secondary | ICD-10-CM | POA: Diagnosis not present

## 2021-01-15 LAB — CUP PACEART INCLINIC DEVICE CHECK
Battery Remaining Longevity: 16
Battery Voltage: 3.22 V
Brady Statistic AP VP Percent: 5.95 %
Brady Statistic AP VS Percent: 9.08 %
Brady Statistic AS VP Percent: 0.27 %
Brady Statistic AS VS Percent: 84.76 %
Brady Statistic RA Percent Paced: 12.84 %
Brady Statistic RV Percent Paced: 6.43 %
Date Time Interrogation Session: 20220407165112
Implantable Lead Implant Date: 20120820
Implantable Lead Implant Date: 20120820
Implantable Lead Location: 753859
Implantable Lead Location: 753860
Implantable Pulse Generator Implant Date: 20220323
Lead Channel Impedance Value: 342 Ohm
Lead Channel Impedance Value: 361 Ohm
Lead Channel Impedance Value: 437 Ohm
Lead Channel Impedance Value: 456 Ohm
Lead Channel Pacing Threshold Amplitude: 0.75 V
Lead Channel Pacing Threshold Amplitude: 1.375 V
Lead Channel Pacing Threshold Pulse Width: 0.4 ms
Lead Channel Pacing Threshold Pulse Width: 0.4 ms
Lead Channel Sensing Intrinsic Amplitude: 7.5 mV
Lead Channel Sensing Intrinsic Amplitude: 7.875 mV
Lead Channel Setting Pacing Amplitude: 2 V
Lead Channel Setting Pacing Amplitude: 2.5 V
Lead Channel Setting Pacing Pulse Width: 0.6 ms
Lead Channel Setting Sensing Sensitivity: 0.9 mV

## 2021-01-18 ENCOUNTER — Other Ambulatory Visit: Payer: Self-pay | Admitting: *Deleted

## 2021-01-18 DIAGNOSIS — I10 Essential (primary) hypertension: Secondary | ICD-10-CM

## 2021-01-18 DIAGNOSIS — I712 Thoracic aortic aneurysm, without rupture, unspecified: Secondary | ICD-10-CM

## 2021-01-18 DIAGNOSIS — I7121 Aneurysm of the ascending aorta, without rupture: Secondary | ICD-10-CM

## 2021-01-20 ENCOUNTER — Encounter (HOSPITAL_COMMUNITY): Payer: Medicare Other

## 2021-01-20 DIAGNOSIS — R350 Frequency of micturition: Secondary | ICD-10-CM | POA: Diagnosis not present

## 2021-01-21 ENCOUNTER — Telehealth: Payer: Self-pay | Admitting: Cardiovascular Disease

## 2021-01-21 NOTE — Telephone Encounter (Signed)
Spoke with Mrs. Alex Maddox regarding the scheduled 01/27/21 at 4:30 pm at [facility] time is 4:00 pm 1st floor registration desk---liquids only 4 hours prior to exam.  Mrs. Alex Maddox voiced her understanding.

## 2021-01-27 ENCOUNTER — Ambulatory Visit (HOSPITAL_COMMUNITY)
Admission: RE | Admit: 2021-01-27 | Discharge: 2021-01-27 | Disposition: A | Discharge status: Home / Self Care | Payer: Medicare Other | Source: Ambulatory Visit | Attending: Cardiovascular Disease | Admitting: Cardiovascular Disease

## 2021-01-27 ENCOUNTER — Other Ambulatory Visit: Payer: Self-pay

## 2021-01-27 DIAGNOSIS — I712 Thoracic aortic aneurysm, without rupture, unspecified: Secondary | ICD-10-CM

## 2021-01-27 DIAGNOSIS — I714 Abdominal aortic aneurysm, without rupture: Secondary | ICD-10-CM | POA: Diagnosis not present

## 2021-01-27 DIAGNOSIS — I7121 Aneurysm of the ascending aorta, without rupture: Secondary | ICD-10-CM

## 2021-01-27 DIAGNOSIS — J9811 Atelectasis: Secondary | ICD-10-CM | POA: Diagnosis not present

## 2021-01-27 LAB — POCT I-STAT CREATININE: Creatinine, Ser: 1.2 mg/dL (ref 0.61–1.24)

## 2021-01-27 MED ORDER — IOHEXOL 350 MG/ML SOLN
100.0000 mL | Freq: Once | INTRAVENOUS | Status: AC | PRN
  Administered 2021-01-27: 100 mL via INTRAVENOUS

## 2021-02-01 ENCOUNTER — Ambulatory Visit (INDEPENDENT_AMBULATORY_CARE_PROVIDER_SITE_OTHER): Payer: Medicare Other | Admitting: *Deleted

## 2021-02-01 DIAGNOSIS — H6122 Impacted cerumen, left ear: Secondary | ICD-10-CM | POA: Diagnosis not present

## 2021-02-01 DIAGNOSIS — H6121 Impacted cerumen, right ear: Secondary | ICD-10-CM | POA: Diagnosis not present

## 2021-02-01 DIAGNOSIS — Z5181 Encounter for therapeutic drug level monitoring: Secondary | ICD-10-CM

## 2021-02-01 DIAGNOSIS — I4891 Unspecified atrial fibrillation: Secondary | ICD-10-CM

## 2021-02-01 LAB — POCT INR: INR: 3 (ref 2.0–3.0)

## 2021-02-01 NOTE — Patient Instructions (Signed)
Continue 1 tablet daily except 1/2 tablet on Mondays and Thursdays Continue greens Recheck in 6 weeks  Leaving on cruise 5/2

## 2021-02-06 DIAGNOSIS — E7849 Other hyperlipidemia: Secondary | ICD-10-CM | POA: Diagnosis not present

## 2021-02-06 DIAGNOSIS — I10 Essential (primary) hypertension: Secondary | ICD-10-CM | POA: Diagnosis not present

## 2021-02-06 DIAGNOSIS — I480 Paroxysmal atrial fibrillation: Secondary | ICD-10-CM | POA: Diagnosis not present

## 2021-02-18 ENCOUNTER — Other Ambulatory Visit: Payer: Self-pay | Admitting: Cardiovascular Disease

## 2021-02-19 ENCOUNTER — Ambulatory Visit: Payer: Medicare Other | Admitting: Neurology

## 2021-02-22 ENCOUNTER — Ambulatory Visit: Payer: Medicare Other | Admitting: Neurology

## 2021-02-22 ENCOUNTER — Encounter: Payer: Self-pay | Admitting: Neurology

## 2021-02-22 ENCOUNTER — Other Ambulatory Visit: Payer: Self-pay

## 2021-02-22 VITALS — BP 120/75 | HR 65 | Ht 72.0 in | Wt 202.0 lb

## 2021-02-22 DIAGNOSIS — M48062 Spinal stenosis, lumbar region with neurogenic claudication: Secondary | ICD-10-CM

## 2021-02-22 DIAGNOSIS — M4807 Spinal stenosis, lumbosacral region: Secondary | ICD-10-CM

## 2021-02-22 DIAGNOSIS — R292 Abnormal reflex: Secondary | ICD-10-CM

## 2021-02-22 DIAGNOSIS — R202 Paresthesia of skin: Secondary | ICD-10-CM

## 2021-02-22 NOTE — Progress Notes (Signed)
[facility] Clinic Note - Initial Visit   Date: 02/22/21  CHICO CAWOOD MRN: [id number] DOB: 18-Feb-1951   Dear Dr. Milinda Pointer:  Thank you for your kind referral of Alex Maddox for consultation of feet numbness. Although his history is well known to you, please allow [name] to reiterate it for the purpose of our medical record. The patient was accompanied to the clinic by wife who also provides collateral information.     History of Present Illness: Alex Maddox is a 70 y.o. right-handed male with atrial fibrillation, OSA, TIA, hypertension, and hyperlipidemia presenting for evaluation of bilateral feet numbness.  Starting around 2021, he began having numbness involving the toes.  Symptoms are intermittent occurring throughout the week.  It is worse when he is walking and active. Symptoms are better at rest.  He usually walks about 2 miles per day and is able to finish his walk, but develops numbness and sometimes heaviness in the legs.  No falls.  He walks unassisted.  NCS/EMG of the legs from February 2022 was normal.   Out-side paper records, electronic medical record, and images have been reviewed where available and summarized as:  NCS/EMG of the legs 11/11/2020:  Normal  Lab Results  Component Value Date   TSH 0.464 08/13/2014    Past Medical History:  Diagnosis Date  . Abnormal echocardiogram    stress myoview 10/29/2010  . Arthritis    " IN MY BACK "  . Dyslipidemia   . Dysrhythmia    Atrial fibrillation  . Hypertension   . Paroxysmal atrial fibrillation (HCC)   . Presence of permanent cardiac pacemaker   . Sinus node dysfunction (HCC)   . Sleep apnea, obstructive    sleep study 10/09/2007  AHI 5.73/hr  REM AHI 11.90/hr   . 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   LEFT THUMB   . Stroke ([place])   . Syncope    2D Echocardiogram 09/27/2010 EF between 40 to 45%  . Tachycardia-bradycardia ([place])   . Transient ischemic attack (TIA) 06/07     Past Surgical History:  Procedure Laterality Date  . COLONOSCOPY    . COLONOSCOPY N/A 06/07/2017   Procedure: COLONOSCOPY;  Surgeon: Rogene Houston, MD;  Location: [facility];  Service: Endoscopy;  Laterality: N/A;  9:30  . COLONOSCOPY N/A 09/20/2018   Procedure: COLONOSCOPY;  Surgeon: Rogene Houston, MD;  Location: [facility];  Service: Endoscopy;  Laterality: N/A;  1030  . ELECTROCARDIOGRAM    . INSERT / REPLACE / REMOVE PACEMAKER  05/2011  . LOOP RECORDER IMPLANT  02/02/2011   Medtronic- CRM   . PACEMAKER INSERTION  05/30/2011   Medtronic Revo   last checked 11/07/2012  . POLYPECTOMY  09/20/2018   Procedure: POLYPECTOMY;  Surgeon: Rogene Houston, MD;  Location: [facility];  Service: Endoscopy;;  colon  . PPM GENERATOR CHANGEOUT N/A 12/30/2020   Procedure: PPM GENERATOR CHANGEOUT;  Surgeon: Sanda Klein, MD;  Location: [place] [facility];  Service: Cardiovascular;  Laterality: N/A;     Medications:  Outpatient Encounter Medications as of 02/22/2021  Medication Sig  . amLODipine (NORVASC) 5 MG tablet Take 1 tablet (5 mg total) by mouth daily.  . carvedilol (COREG) 25 MG tablet TAKE ONE TABLET (25MG  TOTAL) BY MOUTH TWO TIMES DAILY WITH A MEAL (Patient taking differently: Take 25 mg by mouth 2 (two) times daily with a meal.)  . diazepam (VALIUM) 2 MG tablet Take 2  mg by mouth every 8 (eight) hours as needed for anxiety.   . finasteride (PROSCAR) 5 MG tablet Take 5 mg by mouth daily.  . Flaxseed Oil OIL Take 1,000 mg by mouth daily.  . irbesartan (AVAPRO) 300 MG tablet Take 1 tablet (300 mg total) by mouth daily.  . Misc Natural Products (NEURIVA) CAPS Take 1 capsule by mouth daily. For memory  . Multiple Vitamin (MULTIVITAMIN WITH MINERALS) TABS tablet Take 1 tablet by mouth daily. Adult Multivitamin 50+  . Omega-3 Fatty Acids (FISH OIL) 1000 MG CAPS Take 1,000 mg by mouth daily.  Vladimir Faster Glycol-Propyl Glycol 0.4-0.3 % SOLN Place 1 drop into both eyes 3 (three)  times daily as needed (for dry eyes.).  Marland Kitchen rosuvastatin (CRESTOR) 20 MG tablet TAKE ONE TABLET (20MG  TOTAL) BY MOUTH DAILY (Patient taking differently: Take 20 mg by mouth daily.)  . Saw Palmetto 450 MG CAPS Take 900 mg by mouth daily.  . trospium (SANCTURA) 20 MG tablet Take 20 mg by mouth daily.  Marland Kitchen warfarin (COUMADIN) 5 MG tablet TAKE ONE TABLET BY MOUTH EVERY DAY AS DIRECTED BY COUMADIN CLINIC (Patient taking differently: Take 2.5-5 mg by mouth See admin instructions. 2.5 mg Mon and Thurs; 5 mg all other days)   No facility-administered encounter medications on file as of 02/22/2021.    Allergies: No Known Allergies  Family History: Family History  Problem Relation Age of Onset  . Hypertension Mother   . Heart attack Father 80       deceased from heart attack  . Hypertension Brother   . Colon cancer Neg Hx     Social History: Social History   Tobacco Use  . Smoking status: Never Smoker  . Smokeless tobacco: Never Used  Vaping Use  . Vaping Use: Never used  Substance Use Topics  . Alcohol use: Yes    Comment: occasionally wine  . 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   Right Handed    Lives in a two story home    Vital Signs:  BP 120/75   Pulse 65   Ht 6' (1.829 m)   Wt 202 lb (91.6 kg)   SpO2 99%   BMI 27.40 kg/m   Neurological Exam: MENTAL STATUS including orientation to time, place, person, recent and remote memory, attention span and concentration, language, and fund of knowledge is normal.  Speech is not dysarthric.  CRANIAL NERVES: II:  No visual field defects.  Unremarkable fundi.   III-IV-VI: Pupils equal round and reactive to light.  Normal conjugate, extra-ocular eye movements in all directions of gaze.  No nystagmus.  No ptosis.   V:  Normal facial sensation.    VII:  Normal facial symmetry and movements.   VIII:  Normal hearing and vestibular function.   IX-X:  Normal palatal  movement.   XI:  Normal shoulder shrug and head rotation.   XII:  Normal tongue strength and range of motion, no deviation or fasciculation.  MOTOR:  No atrophy, fasciculations or abnormal movements.  No pronator drift.   Upper Extremity:  Right  Left  Deltoid  5/5   5/5   Biceps  5/5   5/5   Triceps  5/5   5/5   Infraspinatus 5/5  5/5  Medial pectoralis 5/5  5/5  Wrist extensors  5/5   5/5   Wrist flexors  5/5   5/5   Finger  extensors  5/5   5/5   Finger flexors  5/5   5/5   Dorsal interossei  5/5   5/5   Abductor pollicis  5/5   5/5   Tone (Ashworth scale)  0  0   Lower Extremity:  Right  Left  Hip flexors  5/5   5/5   Hip extensors  5/5   5/5   Adductor 5/5  5/5  Abductor 5/5  5/5  Knee flexors  5/5   5/5   Knee extensors  5/5   5/5   Dorsiflexors  5/5   5/5   Plantarflexors  5/5   5/5   Toe extensors  5/5   5/5   Toe flexors  5/5   5/5   Tone (Ashworth scale)  0  0   MSRs:  Right        Left                  brachioradialis 2+  2+  biceps 2+  2+  triceps 2+  2+  patellar 3+  3+  ankle jerk 2+  2+  Hoffman no  no  plantar response down  down   SENSORY:  Vibration is trace at the ankles, worse on the left.  Temperature and pin prick is reduced over the feet.   Romberg's sign absent.   COORDINATION/GAIT: Normal finger-to- nose-finger.  Intact rapid alternating movements bilaterally.  Able to rise from a chair without using arms.  Gait narrow based and stable. Tandem and stressed gait intact.    IMPRESSION: Bilateral feet numbness, possibly due to lumbar canal stenosis.  Exam shows hyperreflexia at the ankle and reduced sensation distally.  Prior NCS/EMG of the legs was normal and did not demonstrate neuropathy.  Next step is imaging of the lumbar spine to look for compressive pathology.    Further recommendations pending results.  Thank you for allowing me to participate in patient's care.  If I can answer any additional questions, I would be pleased to do so.     Sincerely,    Caretha Rumbaugh K. Posey Pronto, DO

## 2021-02-22 NOTE — Patient Instructions (Signed)
MRI lumbar spine without contrast

## 2021-02-27 NOTE — Addendum Note (Signed)
Encounter addended by: Annie Paras on: 02/27/2021 1:53 PM  Actions taken: Letter saved

## 2021-03-01 ENCOUNTER — Encounter: Payer: Medicare Other | Admitting: Cardiovascular Disease

## 2021-03-02 ENCOUNTER — Telehealth: Payer: Self-pay | Admitting: Cardiovascular Disease

## 2021-03-02 NOTE — Telephone Encounter (Signed)
Pt c/o medication issue:  1. Name of Medication: amLODipine (NORVASC) 5 MG tablet  2. How are you currently taking this medication (dosage and times per day)? Not currently taking  3. Are you having a reaction (difficulty breathing--STAT)? Yes   4. What is your medication issue? Wants to know how he should take medication due to taking it as prescribed causing hypertension     Pt c/o BP issue: STAT if pt c/o blurred vision, one-sided weakness or slurred speech  1. What are your last 5 BP readings?  112/76 today  118/81 yesterday 106/76 02/28/21 83/55 02/23/21 quit amlodipine next day   2. Are you having any other symptoms (ex. Dizziness, headache, blurred vision, passed out)? Dizzy & weak  3. What is your BP issue? Was prescribed amlodipine again due to hypertension, but he then began experiencing hypotension so stopped medication. States the day he stopped he was dizzy and weak, but BP has been better since stopping. Didn't know if a lower dose should be prescribed to prevent hypertension from reoccurring or if he should begin taking it every other day. States he will be home for another hour, but then will be leaving. Requesting a detailed VM be left if he does not answer.

## 2021-03-02 NOTE — Telephone Encounter (Signed)
Amlodipine is not a great choice for "as needed" BP control since it is very slow acting and long lasting. How about we try taking the amlodipine at half the previous dose (take 2.5 mg), but take it daily, please.

## 2021-03-02 NOTE — Telephone Encounter (Signed)
Spoke with pt, he reports he has not taken the amlodipine for a couple days and his bp is running okay. He thinks he maybe drinking enough fluids. He also reports he has passed out 3 times since his battery in his device was changed. He is not able to send a transmission because his phone is not working and he is waiting for another monitor to come in the mail. He wants to know if he can take the amlodipine as needed if his bp gets high. Will make dr croitoru aware

## 2021-03-03 MED ORDER — AMLODIPINE BESYLATE 5 MG PO TABS: 2.5000 mg | ORAL_TABLET | Freq: Every day | ORAL | 3 refills | Status: DC

## 2021-03-03 NOTE — Telephone Encounter (Signed)
Left detailed message for patient of dr croitoru's recommendations.

## 2021-03-03 NOTE — Telephone Encounter (Signed)
Left a message for the patient to call back.  

## 2021-03-09 DIAGNOSIS — E7849 Other hyperlipidemia: Secondary | ICD-10-CM | POA: Diagnosis not present

## 2021-03-09 DIAGNOSIS — I480 Paroxysmal atrial fibrillation: Secondary | ICD-10-CM | POA: Diagnosis not present

## 2021-03-09 DIAGNOSIS — I10 Essential (primary) hypertension: Secondary | ICD-10-CM | POA: Diagnosis not present

## 2021-03-12 DIAGNOSIS — N182 Chronic kidney disease, stage 2 (mild): Secondary | ICD-10-CM | POA: Diagnosis not present

## 2021-03-12 DIAGNOSIS — R809 Proteinuria, unspecified: Secondary | ICD-10-CM | POA: Diagnosis not present

## 2021-03-12 DIAGNOSIS — I129 Hypertensive chronic kidney disease with stage 1 through stage 4 chronic kidney disease, or unspecified chronic kidney disease: Secondary | ICD-10-CM | POA: Diagnosis not present

## 2021-03-12 DIAGNOSIS — E870 Hyperosmolality and hypernatremia: Secondary | ICD-10-CM | POA: Diagnosis not present

## 2021-03-12 DIAGNOSIS — D696 Thrombocytopenia, unspecified: Secondary | ICD-10-CM | POA: Diagnosis not present

## 2021-03-15 ENCOUNTER — Ambulatory Visit (INDEPENDENT_AMBULATORY_CARE_PROVIDER_SITE_OTHER): Payer: Medicare Other | Admitting: *Deleted

## 2021-03-15 DIAGNOSIS — I4891 Unspecified atrial fibrillation: Secondary | ICD-10-CM | POA: Diagnosis not present

## 2021-03-15 DIAGNOSIS — Z5181 Encounter for therapeutic drug level monitoring: Secondary | ICD-10-CM | POA: Diagnosis not present

## 2021-03-15 LAB — POCT INR: INR: 3.5 — AB (ref 2.0–3.0)

## 2021-03-15 NOTE — Patient Instructions (Signed)
Hold warfarin tonight then resume 1 tablet daily except 1/2 tablet on Mondays and Thursdays Continue greens Recheck in 6 weeks

## 2021-03-19 ENCOUNTER — Telehealth: Payer: Self-pay

## 2021-03-19 DIAGNOSIS — R809 Proteinuria, unspecified: Secondary | ICD-10-CM | POA: Diagnosis not present

## 2021-03-19 DIAGNOSIS — N1831 Chronic kidney disease, stage 3a: Secondary | ICD-10-CM | POA: Diagnosis not present

## 2021-03-19 DIAGNOSIS — I129 Hypertensive chronic kidney disease with stage 1 through stage 4 chronic kidney disease, or unspecified chronic kidney disease: Secondary | ICD-10-CM | POA: Diagnosis not present

## 2021-03-19 DIAGNOSIS — E870 Hyperosmolality and hypernatremia: Secondary | ICD-10-CM | POA: Diagnosis not present

## 2021-03-19 DIAGNOSIS — D696 Thrombocytopenia, unspecified: Secondary | ICD-10-CM | POA: Diagnosis not present

## 2021-03-19 NOTE — Telephone Encounter (Signed)
Patients MRI does not need a PA. Patient can call [facility] to schedule. [phone]

## 2021-03-23 NOTE — Telephone Encounter (Signed)
Called patient and left a message on the answering machine per DPR providing patient with the phone number to [facility] to schedule his MRI. Informed patient to call us if he had any questions or concerns.

## 2021-03-29 NOTE — Progress Notes (Signed)
Called patient in regards to MRI appointment on 03/30/2021 at 1300. Left instructions for patient to arrive between 09-1229.

## 2021-03-30 ENCOUNTER — Ambulatory Visit (HOSPITAL_COMMUNITY)
Admission: RE | Admit: 2021-03-30 | Discharge: 2021-03-30 | Disposition: A | Discharge status: Home / Self Care | Payer: Medicare Other | Source: Ambulatory Visit | Attending: Neurology | Admitting: Neurology

## 2021-03-30 ENCOUNTER — Other Ambulatory Visit: Payer: Self-pay

## 2021-03-30 DIAGNOSIS — M5127 Other intervertebral disc displacement, lumbosacral region: Secondary | ICD-10-CM | POA: Diagnosis not present

## 2021-03-30 DIAGNOSIS — M5126 Other intervertebral disc displacement, lumbar region: Secondary | ICD-10-CM | POA: Diagnosis not present

## 2021-03-30 DIAGNOSIS — M4807 Spinal stenosis, lumbosacral region: Secondary | ICD-10-CM | POA: Diagnosis not present

## 2021-03-30 DIAGNOSIS — M48061 Spinal stenosis, lumbar region without neurogenic claudication: Secondary | ICD-10-CM | POA: Diagnosis not present

## 2021-03-30 DIAGNOSIS — I714 Abdominal aortic aneurysm, without rupture: Secondary | ICD-10-CM | POA: Diagnosis not present

## 2021-03-30 NOTE — Progress Notes (Signed)
Per order, Changed device settings for MRI to  DOO at 75 bpm  Will program device back to pre-MRI settings after completion of exam, and send transmission.

## 2021-04-01 ENCOUNTER — Ambulatory Visit (INDEPENDENT_AMBULATORY_CARE_PROVIDER_SITE_OTHER): Payer: Medicare Other

## 2021-04-01 DIAGNOSIS — I495 Sick sinus syndrome: Secondary | ICD-10-CM | POA: Diagnosis not present

## 2021-04-01 LAB — CUP PACEART REMOTE DEVICE CHECK
Battery Remaining Longevity: 171 mo
Battery Voltage: 3.2 V
Brady Statistic AP VP Percent: 3.45 %
Brady Statistic AP VS Percent: 14.39 %
Brady Statistic AS VP Percent: 0.22 %
Brady Statistic AS VS Percent: 81.99 %
Brady Statistic RA Percent Paced: 13.3 %
Brady Statistic RV Percent Paced: 6.37 %
Date Time Interrogation Session: 20220621124814
Implantable Lead Implant Date: 20120820
Implantable Lead Implant Date: 20120820
Implantable Lead Location: 753859
Implantable Lead Location: 753860
Implantable Pulse Generator Implant Date: 20220323
Lead Channel Impedance Value: 323 Ohm
Lead Channel Impedance Value: 342 Ohm
Lead Channel Impedance Value: 418 Ohm
Lead Channel Impedance Value: 437 Ohm
Lead Channel Pacing Threshold Amplitude: 0.75 V
Lead Channel Pacing Threshold Amplitude: 1.625 V
Lead Channel Pacing Threshold Pulse Width: 0.4 ms
Lead Channel Pacing Threshold Pulse Width: 0.4 ms
Lead Channel Sensing Intrinsic Amplitude: 5.75 mV
Lead Channel Sensing Intrinsic Amplitude: 5.75 mV
Lead Channel Sensing Intrinsic Amplitude: 7.625 mV
Lead Channel Sensing Intrinsic Amplitude: 7.625 mV
Lead Channel Setting Pacing Amplitude: 3.5 V
Lead Channel Setting Pacing Amplitude: 3.5 V
Lead Channel Setting Pacing Pulse Width: 0.6 ms
Lead Channel Setting Sensing Sensitivity: 0.9 mV

## 2021-04-08 ENCOUNTER — Other Ambulatory Visit: Payer: Self-pay | Admitting: Cardiovascular Disease

## 2021-04-08 ENCOUNTER — Telehealth: Payer: Self-pay

## 2021-04-08 NOTE — Telephone Encounter (Signed)
Called patient and spoke to patients wife. Patients wife will have patient give our office a call back once he is done driving.

## 2021-04-08 NOTE — Telephone Encounter (Signed)
-----   Message from Alda Berthold, DO sent at 04/03/2021  4:14 PM EDT ----- Please inform pt that his MRI lumbar spine shows mild age-related changes, no specific areas of narrowing to explain his feet numbness.  Please have him schedule a follow-up visit in 1-2 months and we can decide if additional testing is indicated, based on how he is doing. Thanks

## 2021-04-09 NOTE — Telephone Encounter (Signed)
Patient called and was informed of his test results and recommendations. Patient stated he will call the front desk to schedule his f/u appt.  Patient had no further questions or concerns.

## 2021-04-19 ENCOUNTER — Ambulatory Visit (INDEPENDENT_AMBULATORY_CARE_PROVIDER_SITE_OTHER): Payer: Medicare Other | Admitting: Cardiovascular Disease

## 2021-04-19 ENCOUNTER — Encounter: Payer: Self-pay | Admitting: Cardiovascular Disease

## 2021-04-19 ENCOUNTER — Other Ambulatory Visit: Payer: Self-pay

## 2021-04-19 VITALS — BP 97/63 | HR 83 | Ht 72.0 in | Wt 200.2 lb

## 2021-04-19 DIAGNOSIS — Z7901 Long term (current) use of anticoagulants: Secondary | ICD-10-CM

## 2021-04-19 DIAGNOSIS — I712 Thoracic aortic aneurysm, without rupture: Secondary | ICD-10-CM | POA: Diagnosis not present

## 2021-04-19 DIAGNOSIS — I10 Essential (primary) hypertension: Secondary | ICD-10-CM

## 2021-04-19 DIAGNOSIS — R55 Syncope and collapse: Secondary | ICD-10-CM | POA: Diagnosis not present

## 2021-04-19 DIAGNOSIS — Z950 Presence of cardiac pacemaker: Secondary | ICD-10-CM

## 2021-04-19 DIAGNOSIS — I7121 Aneurysm of the ascending aorta, without rupture: Secondary | ICD-10-CM

## 2021-04-19 DIAGNOSIS — I519 Heart disease, unspecified: Secondary | ICD-10-CM

## 2021-04-19 DIAGNOSIS — I2510 Atherosclerotic heart disease of native coronary artery without angina pectoris: Secondary | ICD-10-CM | POA: Diagnosis not present

## 2021-04-19 DIAGNOSIS — I480 Paroxysmal atrial fibrillation: Secondary | ICD-10-CM

## 2021-04-19 DIAGNOSIS — I714 Abdominal aortic aneurysm, without rupture, unspecified: Secondary | ICD-10-CM

## 2021-04-19 DIAGNOSIS — I4891 Unspecified atrial fibrillation: Secondary | ICD-10-CM

## 2021-04-19 DIAGNOSIS — I495 Sick sinus syndrome: Secondary | ICD-10-CM

## 2021-04-19 DIAGNOSIS — E785 Hyperlipidemia, unspecified: Secondary | ICD-10-CM

## 2021-04-19 LAB — PACEMAKER DEVICE OBSERVATION

## 2021-04-19 NOTE — Patient Instructions (Addendum)
Medication Instructions:  STOP the Amlodipine  *If you need a refill on your cardiac medications before your next appointment, please call your pharmacy*   Lab Work: Your provider would like for you to return in a few weeks to have the following labs drawn: fasting Lipid, A1C, and CMET. You do not need an appointment for the lab. Once in our office lobby there is a podium where you can sign in and ring the doorbell to alert [name] that you are here. The lab is open from 8:00 am to 4:30 pm; closed for lunch from 12:45pm-1:45pm.  If you have labs (blood work) drawn today and your tests are completely normal, you will receive your results only by: [place] (if you have MyChart) OR A paper copy in the mail If you have any lab test that is abnormal or we need to change your treatment, we will call you to review the results.   Testing/Procedures: Your physician has requested that you have an abdominal aorta duplex in July 2023. During this test, an ultrasound is used to evaluate the aorta. Allow 30 minutes for this exam. Do not eat after midnight the day before and avoid carbonated beverages. This will take place at [address], [address].   Follow-Up: At [facility], you and your health needs are our priority.  As part of our continuing mission to provide you with exceptional heart care, we have created designated Provider Care Teams.  These Care Teams include your primary Cardiologist (physician) and Advanced Practice Providers (APPs -  Physician Assistants and Nurse Practitioners) who all work together to provide you with the care you need, when you need it.  We recommend signing up for the patient portal called "MyChart".  Sign up information is provided on this After Visit Summary.  MyChart is used to connect with patients for Virtual Visits (Telemedicine).  Patients are able to view lab/test results, encounter notes, upcoming appointments, etc.  Non-urgent messages can be sent to  your provider as well.   To learn more about what you can do with MyChart, go to [email or web address].    Your next appointment:   3-4 months  The format for your next appointment:   In Person  Provider:   Sanda Klein, MD

## 2021-04-19 NOTE — Progress Notes (Signed)
Patient ID: Alex Maddox, male   DOB: May 09, 1951, 70 y.o.   MRN: [id number]    Cardiology Office Note    Date:  04/20/2021   ID:  Alex Maddox 12/21/1950, MRN [id number]  PCP:  Redmond School, MD  Cardiologist:   Sanda Klein, MD   Chief Complaint  Patient presents with   Loss of Consciousness    History of Present Illness:  Alex Maddox is a 70 y.o. male returning for routine f/u for paroxysmal atrial fibrillation, decreased LVEF without clinical heart failure, thoracic and abdominal aortic aneurysm, neurally mediated syncope, tachycardia-bradycardia syndrome and pacemaker.  Alex Maddox has had 4 syncopal events in the last 4 months all of them with a pattern consistent with his previous history of vasovagal syncope.  2 episodes happened while he was in church, 1 while he was standing at a funeral service and once when he was actually in a wheelchair at home show exhibit.  On each occasion, he improved after lying flat for a couple of minutes.  He did not injure himself with any of these syncopal events.  Each time he had a typical prodromal symptoms of flushing, weakness and blurry vision.  Otherwise he generally feels well and denies exertional angina or dyspnea, palpitations, dizziness, lower extremity edema, orthopnea, PND, claudication.  His pacemaker site has healed well (generator change out March 2022).  He has an MRI conditional Medtronic Azure device with 5086 leads.  Device at beginning of service with estimated generator longevity of 14 years.  He has only 17% atrial pacing and 7% ventricular pacing.  He has had a steadily increasing burden of atrial fibrillation which is now roughly 29% of the time.  Rate control on the average is good, although he has occasional episodes of RVR.  Activity steady at about 3 hours a day.  Sudden bradycardia response was made more aggressive today.  Changed it to a rate drop of 20 bpm with an intervention at 100 bpm for 2 minutes.  He is  still waiting to receive his new Medtronic monitor for home transmissions.  His dietary habits remain poor.  Has a chronically low HDL cholesterol and borderline hemoglobin A1c, despite the fact that he is relatively lean (he is now officially overweight though, after gaining a few pounds with a BMI of 27).  He has a history of neurally mediated syncope with both cardioinhibitory and vasodepressor components and received a dual-chamber Medtronic MRI conditional permanent pacemaker in August 2012. He has a history of paroxysmal atrial fibrillation and a history of TIA at age 32. He had a traumatic right occipital intracerebral hemorrhage while on warfarin anticoagulation in November 2016. He also has thoracic ascending aortic aneurysm, infrarenal AAA, systemic hypertension and hyperlipidemia. Normal nuclear stress test perfusion pattern in 2012, EF 43%; similar EF by echocardiography. Echo images are very difficult and follow-up echo in 2015 could not quantify EF. He has never had clinical heart failure. He takes a statin for hyperlipidemia as well as valsartan and carvedilol for both the arrhythmia, the HTN and the cardiomyopathy. He has ascending aortic enlargement (4.2 cm) and fusiform ectasia of the infrarenal abdominal aorta (max diam 3.0 cm) and CT evidence of coronary calcification.  Past Medical History:  Diagnosis Date   Abnormal echocardiogram    stress myoview 10/29/2010   Arthritis    " IN MY BACK "   Dyslipidemia    Dysrhythmia    Atrial fibrillation   Hypertension    Paroxysmal atrial  fibrillation ([place])    Presence of permanent cardiac pacemaker    Sinus node dysfunction (HCC)    Sleep apnea, obstructive    sleep study 10/09/2007  AHI 5.73/hr  REM AHI 11.90/hr    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   LEFT THUMB    Stroke [facility])    Syncope    2D Echocardiogram 09/27/2010 EF between 40 to 45%   Tachycardia-bradycardia ([place])    Transient ischemic attack (TIA) 06/07     Past Surgical History:  Procedure Laterality Date   COLONOSCOPY     COLONOSCOPY N/A 06/07/2017   Procedure: COLONOSCOPY;  Surgeon: Rogene Houston, MD;  [facility];  Service: Endoscopy;  Laterality: N/A;  9:30   COLONOSCOPY N/A 09/20/2018   Procedure: COLONOSCOPY;  Surgeon: Rogene Houston, MD;  Location: [facility];  Service: Endoscopy;  Laterality: N/A;  [place] / REPLACE / REMOVE PACEMAKER  05/2011   LOOP RECORDER IMPLANT  02/02/2011   Medtronic- CRM    PACEMAKER INSERTION  05/30/2011   Medtronic Revo   last checked 11/07/2012   POLYPECTOMY  09/20/2018   Procedure: POLYPECTOMY;  Surgeon: Rogene Houston, MD;  Location: [facility];  Service: Endoscopy;;  colon   PPM GENERATOR CHANGEOUT N/A 12/30/2020   Procedure: PPM GENERATOR CHANGEOUT;  Surgeon: Sanda Klein, MD;  Location: [facility];  Service: Cardiovascular;  Laterality: N/A;    Outpatient Medications Prior to Visit  Medication Sig Dispense Refill   carvedilol (COREG) 25 MG tablet TAKE ONE TABLET (25MG  TOTAL) BY MOUTH TWO TIMES DAILY WITH A MEAL 180 tablet 2   diazepam (VALIUM) 2 MG tablet Take 2 mg by mouth every 8 (eight) hours as needed for anxiety.      finasteride (PROSCAR) 5 MG tablet Take 5 mg by mouth daily.     Flaxseed Oil OIL Take 1,000 mg by mouth daily.     irbesartan (AVAPRO) 300 MG tablet Take 1 tablet (300 mg total) by mouth daily. 90 tablet 3   Misc Natural Products (NEURIVA) CAPS Take 1 capsule by mouth daily. For memory     Multiple Vitamin (MULTIVITAMIN WITH MINERALS) TABS tablet Take 1 tablet by mouth daily. Adult Multivitamin 50+     Omega-3 Fatty Acids (FISH OIL) 1000 MG CAPS Take 1,000 mg by mouth daily.     Polyethyl Glycol-Propyl Glycol 0.4-0.3 % SOLN Place 1 drop into both eyes 3 (three) times daily as needed (for dry eyes.).     rosuvastatin (CRESTOR) 20 MG tablet TAKE ONE TABLET (20MG  TOTAL) BY MOUTH DAILY 90 tablet 3   Saw Palmetto 450  MG CAPS Take 900 mg by mouth daily.     trospium (SANCTURA) 20 MG tablet Take 20 mg by mouth daily.     warfarin (COUMADIN) 5 MG tablet TAKE ONE TABLET BY MOUTH EVERY DAY AS DIRECTED BY COUMADIN CLINIC 90 tablet 1   amLODipine (NORVASC) 5 MG tablet Take 5 mg by mouth daily.     amLODipine (NORVASC) 5 MG tablet Take 0.5 tablets (2.5 mg total) by mouth daily. (Patient not taking: Reported on 04/19/2021) 90 tablet 3   No facility-administered medications prior to visit.     Allergies:   Patient has no known allergies.   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   Right Handed    Lives in a two story home   Social Determinants of Health   Financial Resource Strain: Not on file  Food Insecurity: Not on file  Transportation Needs: Not on file  Physical Activity: Not on file  Stress: Not on file  Social Connections: Not on file     Family History:  The patient's family history includes Heart attack (age of onset: 13) in his father; Hypertension in his brother and mother.   ROS:   Please see the history of present illness.    ROS All other systems are reviewed and are negative.   PHYSICAL EXAM:   VS:  BP 97/63 (BP Location: Left Arm, Patient Position: Sitting, Cuff Size: Normal)   Pulse 83   Ht 6' (1.829 m)   Wt 200 lb 3.2 oz (90.8 kg)   SpO2 99%   BMI 27.15 kg/m      General: Alert, oriented x3, no distress, mildly overweight Head: no evidence of  trauma, PERRL, EOMI, no exophtalmos or lid lag, no myxedema, no xanthelasma; normal ears, nose and oropharynx Neck: normal jugular venous pulsations and no hepatojugular reflux; brisk carotid pulses without delay and no carotid bruits Chest: clear to auscultation, no signs of consolidation by percussion or palpation, normal fremitus, symmetrical and full respiratory excursions Cardiovascular: normal position and quality of the apical impulse, regular rhythm, normal first and second heart sounds, no murmurs, rubs or gallops Abdomen: no tenderness or distention, no masses by palpation, no abnormal pulsatility or arterial bruits, normal bowel sounds, no hepatosplenomegaly Extremities: no clubbing, cyanosis or edema; 2+ radial, ulnar and brachial pulses bilaterally; 2+ right femoral, posterior tibial and dorsalis pedis pulses; 2+ left femoral, posterior tibial and dorsalis pedis pulses; no subclavian or femoral bruits Neurological: grossly nonfocal Psych: Normal mood and affect Healthy left subclavian pacemaker site   Wt Readings from Last 3 Encounters:  04/19/21 200 lb 3.2 oz (90.8 kg)  02/22/21 202 lb (91.6 kg)  12/30/20 180 lb (81.6 kg)      Studies/Labs Reviewed:   EKG:  EKG is not ordered today.  ECG from 12/30/2020 shows mostly sinus rhythm with right bundle branch block, but also shows a few ventricular paced beats.  Today's intracardiac electrogram during pacemaker check shows atrial fibrillation with controlled ventricular response, mostly ventricular sensed beats, rare ventricular pacing.  CTA 05/24/2019: 1. Stable mild aneurysmal disease of the ascending thoracic aorta with maximal diameter of approximately 4.2 cm. 2. Stable coronary atherosclerosis. 3. Stable 5 mm right upper lobe and 6 mm right middle lobe pulmonary nodules. 4. Stable to minimally larger ectasia/mild aneurysmal disease of the distal abdominal aorta measuring 2.8 x 3.0 cm. 5. Non critical and stable appearing  roughly 40% stenosis of the proximal SMA trunk and 40-50% stenosis of the proximal right renal artery. 6. Small left inguinal hernia containing fat.  BMET    Component Value Date/Time   NA 142 12/24/2020 1606  K 3.7 12/24/2020 1606   CL 104 12/24/2020 1606   CO2 24 12/24/2020 1606   GLUCOSE 108 (H) 12/24/2020 1606   GLUCOSE 88 07/05/2018 0533   BUN 15 12/24/2020 1606   CREATININE 1.20 01/27/2021 1621   CREATININE 1.19 01/16/2017 0846   CALCIUM 9.7 12/24/2020 1606   GFRNONAA 65 08/09/2019 0912   GFRAA 75 08/09/2019 0912   Lipid Panel     Component Value Date/Time   CHOL 155 08/09/2019 0912   TRIG 106 08/09/2019 0912   HDL 39 (L) 08/09/2019 0912   CHOLHDL 4.0 08/09/2019 0912   LDLCALC 96 08/09/2019 0912   LABVLDL 20 08/09/2019 0912     ASSESSMENT:    1. Essential hypertension   2. Paroxysmal atrial fibrillation (HCC)   3. Tachycardia-bradycardia syndrome ([place])   4. Long term current use of anticoagulant therapy   5. Neurocardiogenic syncope   6. Pacemaker   7. Ascending aortic aneurysm ([place])   8. AAA (abdominal aortic aneurysm) without rupture ([place])   9. Coronary artery calcification seen on CT scan   10. Left ventricular dysfunction   11. Dyslipidemia (high LDL; low HDL)      PLAN:  In order of problems listed above:  PAF: The burden of arrhythmia is high, but rate control is fair and he is completely asymptomatic with it (in atrial fibrillation in the clinic today, completely unaware of it).  Since he is asymptomatic, would like to avoid antiarrhythmics, especially since he has mildly depressed left ventricular systolic function, which would limit our choices of antiarrhythmics.  He is appropriately anticoagulated. CHADS Vasc 5 (TIA 2, HTN, vascular disease, LV dysfunction). Tachy-brady sd: Infrequent need for pacing. Warfarin: No bleeding complications or falls.  Has a remote history of TIA. Neurally mediated syncope: Recent near syncopal event.  Stop the  amlodipine altogether.  Tolerate systolic blood pressure in the 40-1 50 range.  Continue irbesartan and carvedilol.  Made the sudden bradycardia response on his pacemaker more aggressive.   PPM: Decreased the threshold for sudden bradycardia response to a 20 beat drop and increase the intervention to 100 bpm. Asc Ao aneurysm: Asymptomatic.  Periodic CT angiography has not shown any evidence of increase in size (ascending aorta diameter 4.2 in 2018, 4.2 in 2020, 4.1 in 2022).  We will increase the interval between CT angiography to 3-5 years. AAA: Conversely, CT angiography of his abdominal aorta shows a slow steady increase in aneurysm size from 2.8 in 2018-3.0 in 2020, now 3.4 in April 2022.  We will recheck this with ultrasonography next year.  Ideally would like his blood pressure to be quite low to prevent aneurysm growth, but this has been associated with unacceptable increase in the frequency of syncope. HTN: Blood pressure is too low.  Stop amlodipine. Coronary Ca: No symptoms of CAD.  He had a normal nuclear stress test in the past (2012).  The focus is on risk factor management.  On statin. LV dysfunction: Clinically euvolemic, NYHA functional class I. EF around 43% based on previous echo and nuclear stress test, but he has never had clinical manifestations of heart failure.  He is on maximum doses of ARB and carvedilol.  Avoid diuretics due to his history of recurrent vasovagal syncope. HLP: LDL was within target range less than 70, but HDL is chronically low.  This will only worsen if he continues to gain weight.  Encourage more physical activity (walking) and reducing unhealthy foods in his diet such as sweets, starches with high  glycemic index, saturated fat.  Medication Adjustments/Labs and Tests Ordered: Current medicines are reviewed at length with the patient today.  Concerns regarding medicines are outlined above.  Medication changes, Labs and Tests ordered today are listed in the Patient  Instructions below. Patient Instructions  Medication Instructions:  STOP the Amlodipine  *If you need a refill on your cardiac medications before your next appointment, please call your pharmacy*   Lab Work: Your provider would like for you to return in a few weeks to have the following labs drawn: fasting Lipid, A1C, and CMET. You do not need an appointment for the lab. Once in our office lobby there is a podium where you can sign in and ring the doorbell to alert [name] that you are here. The lab is open from 8:00 am to 4:30 pm; closed for lunch from 12:45pm-1:45pm.  If you have labs (blood work) drawn today and your tests are completely normal, you will receive your results only by: [place] (if you have MyChart) OR A paper copy in the mail If you have any lab test that is abnormal or we need to change your treatment, we will call you to review the results.   Testing/Procedures: None ordered   Follow-Up: At [facility], you and your health needs are our priority.  As part of our continuing mission to provide you with exceptional heart care, we have created designated Provider Care Teams.  These Care Teams include your primary Cardiologist (physician) and Advanced Practice Providers (APPs -  Physician Assistants and Nurse Practitioners) who all work together to provide you with the care you need, when you need it.  We recommend signing up for the patient portal called "MyChart".  Sign up information is provided on this After Visit Summary.  MyChart is used to connect with patients for Virtual Visits (Telemedicine).  Patients are able to view lab/test results, encounter notes, upcoming appointments, etc.  Non-urgent messages can be sent to your provider as well.   To learn more about what you can do with MyChart, go to [email or web address].    Your next appointment:   3-4 months  The format for your next appointment:   In Person  Provider:   Sanda Klein, MD       Signed, Sanda Klein, MD  04/20/2021 10:59 AM    [facility] [address], [place], [place]  [address] Phone: [phone]; Fax: [phone]

## 2021-04-20 ENCOUNTER — Encounter: Payer: Self-pay | Admitting: Cardiovascular Disease

## 2021-04-20 NOTE — Progress Notes (Signed)
Remote pacemaker transmission.   

## 2021-04-21 ENCOUNTER — Ambulatory Visit: Payer: Medicare Other | Admitting: *Deleted

## 2021-04-21 DIAGNOSIS — I4891 Unspecified atrial fibrillation: Secondary | ICD-10-CM | POA: Diagnosis not present

## 2021-04-21 DIAGNOSIS — Z5181 Encounter for therapeutic drug level monitoring: Secondary | ICD-10-CM

## 2021-04-21 LAB — POCT INR: INR: 3.4 — AB (ref 2.0–3.0)

## 2021-04-23 DIAGNOSIS — B355 Tinea imbricata: Secondary | ICD-10-CM | POA: Diagnosis not present

## 2021-05-03 DIAGNOSIS — E7489 Other specified disorders of carbohydrate metabolism: Secondary | ICD-10-CM | POA: Diagnosis not present

## 2021-05-03 DIAGNOSIS — E782 Mixed hyperlipidemia: Secondary | ICD-10-CM | POA: Diagnosis not present

## 2021-05-03 DIAGNOSIS — Z79899 Other long term (current) drug therapy: Secondary | ICD-10-CM | POA: Diagnosis not present

## 2021-05-03 DIAGNOSIS — R7309 Other abnormal glucose: Secondary | ICD-10-CM | POA: Diagnosis not present

## 2021-05-03 DIAGNOSIS — I10 Essential (primary) hypertension: Secondary | ICD-10-CM | POA: Diagnosis not present

## 2021-05-03 DIAGNOSIS — I4891 Unspecified atrial fibrillation: Secondary | ICD-10-CM | POA: Diagnosis not present

## 2021-05-03 DIAGNOSIS — N182 Chronic kidney disease, stage 2 (mild): Secondary | ICD-10-CM | POA: Diagnosis not present

## 2021-05-03 DIAGNOSIS — J329 Chronic sinusitis, unspecified: Secondary | ICD-10-CM | POA: Diagnosis not present

## 2021-05-04 DIAGNOSIS — Z79899 Other long term (current) drug therapy: Secondary | ICD-10-CM | POA: Diagnosis not present

## 2021-05-04 DIAGNOSIS — N182 Chronic kidney disease, stage 2 (mild): Secondary | ICD-10-CM | POA: Diagnosis not present

## 2021-05-04 DIAGNOSIS — I4891 Unspecified atrial fibrillation: Secondary | ICD-10-CM | POA: Diagnosis not present

## 2021-05-04 DIAGNOSIS — E7489 Other specified disorders of carbohydrate metabolism: Secondary | ICD-10-CM | POA: Diagnosis not present

## 2021-05-04 DIAGNOSIS — I10 Essential (primary) hypertension: Secondary | ICD-10-CM | POA: Diagnosis not present

## 2021-05-05 LAB — COMPREHENSIVE METABOLIC PANEL
ALT: 32 IU/L (ref 0–44)
AST: 21 IU/L (ref 0–40)
Albumin/Globulin Ratio: 2.1 (ref 1.2–2.2)
Albumin: 4.4 g/dL (ref 3.8–4.8)
Alkaline Phosphatase: 68 IU/L (ref 44–121)
BUN/Creatinine Ratio: 18 (ref 10–24)
BUN: 23 mg/dL (ref 8–27)
Bilirubin Total: 0.5 mg/dL (ref 0.0–1.2)
CO2: 23 mmol/L (ref 20–29)
Calcium: 9.8 mg/dL (ref 8.6–10.2)
Chloride: 103 mmol/L (ref 96–106)
Creatinine, Ser: 1.25 mg/dL (ref 0.76–1.27)
Globulin, Total: 2.1 g/dL (ref 1.5–4.5)
Glucose: 135 mg/dL — ABNORMAL HIGH (ref 65–99)
Potassium: 4.2 mmol/L (ref 3.5–5.2)
Sodium: 141 mmol/L (ref 134–144)
Total Protein: 6.5 g/dL (ref 6.0–8.5)
eGFR: 63 mL/min/{1.73_m2} (ref >59)

## 2021-05-05 LAB — LIPID PANEL
Chol/HDL Ratio: 3.3 ratio (ref 0.0–5.0)
Cholesterol, Total: 123 mg/dL (ref 100–199)
HDL: 37 mg/dL — ABNORMAL LOW (ref >39)
LDL Chol Calc (NIH): 71 mg/dL (ref 0–99)
Triglycerides: 71 mg/dL (ref 0–149)
VLDL Cholesterol Cal: 15 mg/dL (ref 5–40)

## 2021-05-05 LAB — HEMOGLOBIN A1C
Est. average glucose Bld gHb Est-mCnc: 131 mg/dL
Hgb A1c MFr Bld: 6.2 % — ABNORMAL HIGH (ref 4.8–5.6)

## 2021-05-09 DIAGNOSIS — E782 Mixed hyperlipidemia: Secondary | ICD-10-CM | POA: Diagnosis not present

## 2021-05-09 DIAGNOSIS — I480 Paroxysmal atrial fibrillation: Secondary | ICD-10-CM | POA: Diagnosis not present

## 2021-05-09 DIAGNOSIS — I10 Essential (primary) hypertension: Secondary | ICD-10-CM | POA: Diagnosis not present

## 2021-05-10 ENCOUNTER — Other Ambulatory Visit: Payer: Self-pay | Admitting: Cardiology

## 2021-05-14 ENCOUNTER — Encounter: Payer: Self-pay | Admitting: Neurology

## 2021-05-14 ENCOUNTER — Ambulatory Visit: Payer: Medicare Other | Admitting: Neurology

## 2021-05-14 ENCOUNTER — Other Ambulatory Visit: Payer: Self-pay

## 2021-05-14 VITALS — BP 114/73 | HR 72 | Ht 72.0 in | Wt 196.0 lb

## 2021-05-14 DIAGNOSIS — R202 Paresthesia of skin: Secondary | ICD-10-CM

## 2021-05-14 DIAGNOSIS — R292 Abnormal reflex: Secondary | ICD-10-CM | POA: Diagnosis not present

## 2021-05-14 NOTE — Patient Instructions (Signed)
If your symptoms get worse, please call my office  Return to clinic in 6 months

## 2021-05-14 NOTE — Progress Notes (Signed)
Follow-up Visit   Date: 05/14/21   Alex Maddox MRN: [id number] DOB: [birth year]   Interim History: Alex Maddox is a 70 y.o. right-handed Caucasian male with atrial fibrillation, OSA, TIA, hypertension, and hyperlipidemia returning to the clinic for follow-up of bilateral feet pain.  The patient was accompanied to the clinic by wife who also provides collateral information.    History of present illness: Starting around 2021, he began having numbness involving the toes.  Symptoms are intermittent occurring throughout the week.  It is worse when he is walking and active. Symptoms are better at rest.  He usually walks about 2 miles per day and is able to finish his walk, but develops numbness and sometimes heaviness in the legs.  No falls.  He walks unassisted.  NCS/EMG of the legs from February 2022 was normal.  UPDATE 05/14/2021:  He is here for follow-up visit.  He reports that his feet have been bothering him much less lately.  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  No weakness, imbalance, or falls. MRI lumbar spine did not show any nerve impingement to explain his symptoms.    Medications:  Current Outpatient Medications on File Prior to Visit  Medication Sig Dispense Refill   carvedilol (COREG) 25 MG tablet TAKE ONE TABLET ('25MG'$  TOTAL) BY MOUTH TWO TIMES DAILY WITH A MEAL 180 tablet 2   diazepam (VALIUM) 2 MG tablet Take 2 mg by mouth every 8 (eight) hours as needed for anxiety.      finasteride (PROSCAR) 5 MG tablet Take 5 mg by mouth daily.     Flaxseed Oil OIL Take 1,000 mg by mouth daily.     irbesartan (AVAPRO) 300 MG tablet Take 1 tablet (300 mg total) by mouth daily. 90 tablet 3   Misc Natural Products (NEURIVA) CAPS Take 1 capsule by mouth daily. For memory     Multiple Vitamin (MULTIVITAMIN WITH MINERALS) TABS tablet Take 1 tablet by mouth daily. Adult Multivitamin  50+     Omega-3 Fatty Acids (FISH OIL) 1000 MG CAPS Take 1,000 mg by mouth daily.     Polyethyl Glycol-Propyl Glycol 0.4-0.3 % SOLN Place 1 drop into both eyes 3 (three) times daily as needed (for dry eyes.).     rosuvastatin (CRESTOR) 20 MG tablet TAKE ONE TABLET ('20MG'$  TOTAL) BY MOUTH DAILY 90 tablet 3   Saw Palmetto 450 MG CAPS Take 900 mg by mouth daily.     trospium (SANCTURA) 20 MG tablet Take 20 mg by mouth daily.     warfarin (COUMADIN) 5 MG tablet TAKE ONE TABLET BY MOUTH EVERY DAY AS DIRECTED BY COUMADIN CLINIC 90 tablet 1   No current facility-administered medications on file prior to visit.    Allergies: No Known Allergies  Vital Signs:  BP 114/73   Pulse 72   Ht 6' (1.829 m)   Wt 196 lb (88.9 kg)   SpO2 98%   BMI 26.58 kg/m   Neurological Exam: MENTAL STATUS including orientation to time, place, person, recent and remote memory, attention span and concentration, language, and fund of knowledge is normal.  Speech is not dysarthric.  CRANIAL NERVES:  No visual field defects.  Pupils equal round and reactive to light.  Normal conjugate, extra-ocular eye movements in all directions of gaze.  No ptosis.    MOTOR:  Motor strength  is 5/5 in all extremities.  No atrophy, fasciculations or abnormal movements.  No pronator drift.  Tone is normal.    MSRs:                                           Right        Left brachioradialis 2+  2+  biceps 2+  2+  triceps 2+  2+  patellar 3+  3+  ankle jerk 2+  2+   SENSORY:  Intact to vibration and temperature throughout.  COORDINATION/GAIT:   Gait narrow based and stable.   Data: MRI lumbar spine wo contrast 03/31/2021: 1. Mild lateral recess and foraminal narrowing bilaterally at L4-5 without definite nerve root encroachment. 2. Mild disc bulging at L2-3 and L5-S1. 3. No significant spinal stenosis. 4. Grossly stable 3.3 cm abdominal aortic aneurysm compared with CTA of 2 months ago. Please refer to that report for follow-up  recommendations.  NCS/EMG of the legs 11/11/2020: This is a normal study of the lower extremities.  In particular, there is no evidence of a sensorimotor polyneuropathy or lumbosacral radiculopathy.  IMPRESSION/PLAN: Bilateral feet paresthesias, exertional.  Prior testing has included NCS/EMG of the legs (no neuropathy) and MRI lumbar spine which shows lumbar spondylosis without nerve impingement. He does have brisk patella reflex and if symptoms get worse, consider additional spine imaging.  I also discussed screening ABI of the feet, since symptoms are worse with activity.  Given that his symptoms are overall improved, it was mutually decided to follow him.  He will return in 6 months, or sooner as needed.   Thank you for allowing me to participate in patient's care.  If I can answer any additional questions, I would be pleased to do so.    Sincerely,    Amber Guthridge K. Posey Pronto, DO

## 2021-05-19 ENCOUNTER — Other Ambulatory Visit: Payer: Self-pay

## 2021-05-19 ENCOUNTER — Ambulatory Visit: Payer: Medicare Other | Admitting: *Deleted

## 2021-05-19 DIAGNOSIS — E0590 Thyrotoxicosis, unspecified without thyrotoxic crisis or storm: Secondary | ICD-10-CM | POA: Diagnosis not present

## 2021-05-19 DIAGNOSIS — Z5181 Encounter for therapeutic drug level monitoring: Secondary | ICD-10-CM | POA: Diagnosis not present

## 2021-05-19 DIAGNOSIS — I4891 Unspecified atrial fibrillation: Secondary | ICD-10-CM

## 2021-05-19 DIAGNOSIS — D72829 Elevated white blood cell count, unspecified: Secondary | ICD-10-CM | POA: Diagnosis not present

## 2021-05-19 DIAGNOSIS — D696 Thrombocytopenia, unspecified: Secondary | ICD-10-CM | POA: Diagnosis not present

## 2021-05-19 LAB — POCT INR: INR: 3 (ref 2.0–3.0)

## 2021-05-19 NOTE — Patient Instructions (Signed)
Continue warfarin 1 tablet daily except 1/2 tablet on Mondays, Wednesdays and Fridays Continue greens Recheck in 4 weeks

## 2021-06-17 ENCOUNTER — Ambulatory Visit: Payer: Medicare Other | Admitting: *Deleted

## 2021-06-17 ENCOUNTER — Other Ambulatory Visit: Payer: Self-pay

## 2021-06-17 DIAGNOSIS — I4891 Unspecified atrial fibrillation: Secondary | ICD-10-CM | POA: Diagnosis not present

## 2021-06-17 DIAGNOSIS — Z5181 Encounter for therapeutic drug level monitoring: Secondary | ICD-10-CM

## 2021-06-17 DIAGNOSIS — N1831 Chronic kidney disease, stage 3a: Secondary | ICD-10-CM | POA: Diagnosis not present

## 2021-06-17 DIAGNOSIS — D696 Thrombocytopenia, unspecified: Secondary | ICD-10-CM | POA: Diagnosis not present

## 2021-06-17 DIAGNOSIS — R809 Proteinuria, unspecified: Secondary | ICD-10-CM | POA: Diagnosis not present

## 2021-06-17 DIAGNOSIS — I129 Hypertensive chronic kidney disease with stage 1 through stage 4 chronic kidney disease, or unspecified chronic kidney disease: Secondary | ICD-10-CM | POA: Diagnosis not present

## 2021-06-17 LAB — POCT INR: INR: 2.4 (ref 2.0–3.0)

## 2021-06-17 NOTE — Patient Instructions (Signed)
Continue warfarin 1 tablet daily except 1/2 tablet on Mondays, Wednesdays and Fridays Continue greens Recheck in 6 weeks

## 2021-06-23 DIAGNOSIS — I129 Hypertensive chronic kidney disease with stage 1 through stage 4 chronic kidney disease, or unspecified chronic kidney disease: Secondary | ICD-10-CM | POA: Diagnosis not present

## 2021-06-23 DIAGNOSIS — R809 Proteinuria, unspecified: Secondary | ICD-10-CM | POA: Diagnosis not present

## 2021-06-23 DIAGNOSIS — D696 Thrombocytopenia, unspecified: Secondary | ICD-10-CM | POA: Diagnosis not present

## 2021-06-23 DIAGNOSIS — E870 Hyperosmolality and hypernatremia: Secondary | ICD-10-CM | POA: Diagnosis not present

## 2021-06-23 DIAGNOSIS — N1831 Chronic kidney disease, stage 3a: Secondary | ICD-10-CM | POA: Diagnosis not present

## 2021-07-01 ENCOUNTER — Ambulatory Visit (INDEPENDENT_AMBULATORY_CARE_PROVIDER_SITE_OTHER): Payer: Medicare Other

## 2021-07-01 DIAGNOSIS — I495 Sick sinus syndrome: Secondary | ICD-10-CM | POA: Diagnosis not present

## 2021-07-03 LAB — CUP PACEART REMOTE DEVICE CHECK
Battery Remaining Longevity: 169 mo
Battery Voltage: 3.18 V
Brady Statistic AP VP Percent: 6.32 %
Brady Statistic AP VS Percent: 17.53 %
Brady Statistic AS VP Percent: 0.13 %
Brady Statistic AS VS Percent: 76.06 %
Brady Statistic RA Percent Paced: 20.37 %
Brady Statistic RV Percent Paced: 7.13 %
Date Time Interrogation Session: 20220921105537
Implantable Lead Implant Date: 20120820
Implantable Lead Implant Date: 20120820
Implantable Lead Location: 753859
Implantable Lead Location: 753860
Implantable Pulse Generator Implant Date: 20220323
Lead Channel Impedance Value: 342 Ohm
Lead Channel Impedance Value: 342 Ohm
Lead Channel Impedance Value: 437 Ohm
Lead Channel Impedance Value: 437 Ohm
Lead Channel Pacing Threshold Amplitude: 0.875 V
Lead Channel Pacing Threshold Amplitude: 1.5 V
Lead Channel Pacing Threshold Pulse Width: 0.4 ms
Lead Channel Pacing Threshold Pulse Width: 0.4 ms
Lead Channel Sensing Intrinsic Amplitude: 4.75 mV
Lead Channel Sensing Intrinsic Amplitude: 4.75 mV
Lead Channel Sensing Intrinsic Amplitude: 6.5 mV
Lead Channel Sensing Intrinsic Amplitude: 6.5 mV
Lead Channel Setting Pacing Amplitude: 1.75 V
Lead Channel Setting Pacing Amplitude: 3 V
Lead Channel Setting Pacing Pulse Width: 0.4 ms
Lead Channel Setting Sensing Sensitivity: 0.9 mV

## 2021-07-05 DIAGNOSIS — H6122 Impacted cerumen, left ear: Secondary | ICD-10-CM | POA: Diagnosis not present

## 2021-07-05 DIAGNOSIS — H6121 Impacted cerumen, right ear: Secondary | ICD-10-CM | POA: Diagnosis not present

## 2021-07-08 NOTE — Progress Notes (Signed)
Remote pacemaker transmission.   

## 2021-07-12 ENCOUNTER — Ambulatory Visit: Payer: Medicare Other | Admitting: Cardiovascular Disease

## 2021-07-28 ENCOUNTER — Ambulatory Visit (INDEPENDENT_AMBULATORY_CARE_PROVIDER_SITE_OTHER): Payer: Medicare Other | Admitting: *Deleted

## 2021-07-28 DIAGNOSIS — Z5181 Encounter for therapeutic drug level monitoring: Secondary | ICD-10-CM

## 2021-07-28 DIAGNOSIS — I4891 Unspecified atrial fibrillation: Secondary | ICD-10-CM | POA: Diagnosis not present

## 2021-07-28 LAB — POCT INR: INR: 2.4 (ref 2.0–3.0)

## 2021-07-28 NOTE — Patient Instructions (Signed)
Continue warfarin 1 tablet daily except 1/2 tablet on Mondays, Wednesdays and Fridays Continue greens Recheck in 6 weeks

## 2021-08-04 ENCOUNTER — Other Ambulatory Visit: Payer: Self-pay | Admitting: Cardiovascular Disease

## 2021-08-06 DIAGNOSIS — D2239 Melanocytic nevi of other parts of face: Secondary | ICD-10-CM | POA: Diagnosis not present

## 2021-08-06 DIAGNOSIS — D225 Melanocytic nevi of trunk: Secondary | ICD-10-CM | POA: Diagnosis not present

## 2021-08-06 DIAGNOSIS — L578 Other skin changes due to chronic exposure to nonionizing radiation: Secondary | ICD-10-CM | POA: Diagnosis not present

## 2021-08-06 DIAGNOSIS — L821 Other seborrheic keratosis: Secondary | ICD-10-CM | POA: Diagnosis not present

## 2021-08-06 DIAGNOSIS — L570 Actinic keratosis: Secondary | ICD-10-CM | POA: Diagnosis not present

## 2021-08-06 DIAGNOSIS — L820 Inflamed seborrheic keratosis: Secondary | ICD-10-CM | POA: Diagnosis not present

## 2021-08-24 ENCOUNTER — Other Ambulatory Visit: Payer: Self-pay

## 2021-08-24 ENCOUNTER — Encounter: Payer: Self-pay | Admitting: Cardiovascular Disease

## 2021-08-24 ENCOUNTER — Ambulatory Visit (INDEPENDENT_AMBULATORY_CARE_PROVIDER_SITE_OTHER): Payer: Medicare Other | Admitting: Cardiovascular Disease

## 2021-08-24 VITALS — BP 140/82 | HR 64 | Ht 72.0 in | Wt 202.8 lb

## 2021-08-24 DIAGNOSIS — I480 Paroxysmal atrial fibrillation: Secondary | ICD-10-CM | POA: Diagnosis not present

## 2021-08-24 DIAGNOSIS — G4710 Hypersomnia, unspecified: Secondary | ICD-10-CM

## 2021-08-24 DIAGNOSIS — I10 Essential (primary) hypertension: Secondary | ICD-10-CM | POA: Diagnosis not present

## 2021-08-24 DIAGNOSIS — I495 Sick sinus syndrome: Secondary | ICD-10-CM | POA: Diagnosis not present

## 2021-08-24 DIAGNOSIS — R55 Syncope and collapse: Secondary | ICD-10-CM | POA: Diagnosis not present

## 2021-08-24 DIAGNOSIS — I519 Heart disease, unspecified: Secondary | ICD-10-CM

## 2021-08-24 DIAGNOSIS — Z7901 Long term (current) use of anticoagulants: Secondary | ICD-10-CM | POA: Diagnosis not present

## 2021-08-24 DIAGNOSIS — I7121 Aneurysm of the ascending aorta, without rupture: Secondary | ICD-10-CM | POA: Diagnosis not present

## 2021-08-24 DIAGNOSIS — E785 Hyperlipidemia, unspecified: Secondary | ICD-10-CM

## 2021-08-24 DIAGNOSIS — I2510 Atherosclerotic heart disease of native coronary artery without angina pectoris: Secondary | ICD-10-CM | POA: Diagnosis not present

## 2021-08-24 DIAGNOSIS — I7143 Infrarenal abdominal aortic aneurysm, without rupture: Secondary | ICD-10-CM | POA: Diagnosis not present

## 2021-08-24 MED ORDER — CARVEDILOL 25 MG PO TABS: 50.0000 mg | ORAL_TABLET | Freq: Two times a day (BID) | ORAL | 5 refills | Status: DC

## 2021-08-24 NOTE — Progress Notes (Signed)
Patient ID: Alex Maddox, male   DOB: 01-Oct-1951, 70 y.o.   MRN: [id number]    Cardiology Office Note    Date:  08/24/2021   ID:  Alex Maddox, DOB 1951-09-11, MRN [id number]  PCP:  Redmond School, MD  Cardiologist:   Sanda Klein, MD   No chief complaint on file.   History of Present Illness:  Alex Maddox is a 70 y.o. male returning for routine f/u for paroxysmal atrial fibrillation, decreased LVEF without clinical heart failure, thoracic and abdominal aortic aneurysm, neurally mediated syncope, tachycardia-bradycardia syndrome and pacemaker (Medtronic Azure with 5086 leads).  Since the adjustments we made in his pacemaker settings we made the sudden bradycardia response more aggressive) he has no longer had any episodes of vasovagal syncope and denies any episodes of presyncope.  He is physically active and denies exertional dyspnea or angina.  He does not have orthopnea, PND, edema, claudication and has not had any new focal neurological deficits.  His wife is worried that he falls asleep "at the drop of a pin".  He immediately falls asleep when he sits down somewhere.  He cannot watch a movie.  He fell asleep during bingo.  He is a moderate snorer.  He had a remote sleep study that showed borderline findings for sleep apnea.  He has gained weight since then.  Has a high score on a Epworth Sleepiness Scale today.  Started back on his amlodipine 5 mg daily because his blood pressure was high.  Interrogation of his pacemaker shows normal device function.  Anticipated generator longevity is 13.7 years.  He continues to have a high burden of paroxysmal atrial fibrillation (18%, similar to his old device) and ventricular rate control remains mediocre with frequent heart rates in the 110-130s when in atrial fibrillation.  3 episodes of "high ventricular rate" represent paroxysmal atrial tachycardia with 1: 1 conduction.  Otherwise has 17% atrial pacing and 7% ventricular pacing.    He has  a history of neurally mediated syncope with both cardioinhibitory and vasodepressor components and received a dual-chamber Medtronic MRI conditional permanent pacemaker in August 2012. He has a history of paroxysmal atrial fibrillation and a history of TIA at age 69. He had a traumatic right occipital intracerebral hemorrhage while on warfarin anticoagulation in November 2016. He also has thoracic ascending aortic aneurysm, infrarenal AAA, systemic hypertension and hyperlipidemia. Normal nuclear stress test perfusion pattern in 2012, EF 43%; similar EF by echocardiography. Echo images are very difficult and follow-up echo in 2015 could not quantify EF. He has never had clinical heart failure. He takes a statin for hyperlipidemia as well as valsartan and carvedilol for both the arrhythmia, the HTN and the cardiomyopathy. He has ascending aortic enlargement (4.2 cm) and fusiform ectasia of the infrarenal abdominal aorta (max diam 3.0 cm) and CT evidence of coronary calcification.  Past Medical History:  Diagnosis Date   Abnormal echocardiogram    stress myoview 10/29/2010   Arthritis    " IN MY BACK "   Dyslipidemia    Dysrhythmia    Atrial fibrillation   Hypertension    Paroxysmal atrial fibrillation (HCC)    Presence of permanent cardiac pacemaker    Sinus node dysfunction (HCC)    Sleep apnea, obstructive    sleep study 10/09/2007  AHI 5.73/hr  REM AHI 11.90/hr    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   LEFT THUMB    Stroke [facility])    Syncope  2D Echocardiogram 09/27/2010 EF between 40 to 45%   Tachycardia-bradycardia (HCC)    Transient ischemic attack (TIA) 06/07    Past Surgical History:  Procedure Laterality Date   COLONOSCOPY     COLONOSCOPY N/A 06/07/2017   Procedure: COLONOSCOPY;  Surgeon: Rogene Houston, MD;  [facility];  Service: Endoscopy;  Laterality: N/A;  9:30   COLONOSCOPY N/A 09/20/2018   Procedure: COLONOSCOPY;  Surgeon: Rogene Houston, MD;   [facility];  Service: Endoscopy;  Laterality: N/A;  [place] / REPLACE / REMOVE PACEMAKER  05/2011   LOOP RECORDER IMPLANT  02/02/2011   Medtronic- CRM    PACEMAKER INSERTION  05/30/2011   Medtronic Revo   last checked 11/07/2012   POLYPECTOMY  09/20/2018   Procedure: POLYPECTOMY;  Surgeon: Rogene Houston, MD;  Location: [facility];  Service: Endoscopy;;  colon   PPM GENERATOR CHANGEOUT N/A 12/30/2020   Procedure: PPM GENERATOR CHANGEOUT;  Surgeon: Sanda Klein, MD;  Location: [place] [facility];  Service: Cardiovascular;  Laterality: N/A;    Outpatient Medications Prior to Visit  Medication Sig Dispense Refill   diazepam (VALIUM) 2 MG tablet Take 2 mg by mouth every 8 (eight) hours as needed for anxiety.      finasteride (PROSCAR) 5 MG tablet Take 5 mg by mouth daily.     Flaxseed Oil OIL Take 1,000 mg by mouth daily.     irbesartan (AVAPRO) 300 MG tablet Take 1 tablet (300 mg total) by mouth daily. 90 tablet 3   Misc Natural Products (NEURIVA) CAPS Take 1 capsule by mouth daily. For memory     Multiple Vitamin (MULTIVITAMIN WITH MINERALS) TABS tablet Take 1 tablet by mouth daily. Adult Multivitamin 50+     Omega-3 Fatty Acids (FISH OIL) 1000 MG CAPS Take 1,000 mg by mouth daily.     Polyethyl Glycol-Propyl Glycol 0.4-0.3 % SOLN Place 1 drop into both eyes 3 (three) times daily as needed (for dry eyes.).     rosuvastatin (CRESTOR) 20 MG tablet TAKE ONE TABLET (20MG  TOTAL) BY MOUTH DAILY 90 tablet 3   Saw Palmetto 450 MG CAPS Take 900 mg by mouth daily.     trospium (SANCTURA) 20 MG tablet Take 20 mg by mouth daily.     warfarin (COUMADIN) 5 MG tablet TAKE ONE TABLET BY MOUTH EVERY DAY AS DIRECTED BY COUMADIN CLINIC 90 tablet 1   carvedilol (COREG) 25 MG tablet TAKE ONE TABLET (25MG  TOTAL) BY MOUTH TWO TIMES DAILY WITH A MEAL 180 tablet 2   No facility-administered medications prior to visit.     Allergies:   Patient has no known  allergies.   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   Right Handed    Lives in a two  story home   Social Determinants of Health   Financial Resource Strain: Not on file  Food Insecurity: Not on file  Transportation Needs: Not on file  Physical Activity: Not on file  Stress: Not on file  Social Connections: Not on file     Family History:  The patient's family history includes Heart attack (age of onset: 53) in his father; Hypertension in his brother and mother.   ROS:   Please see the history of present illness.    ROS All other systems are reviewed and are negative.   PHYSICAL EXAM:   VS:  BP 140/82   Pulse 64   Ht 6' (1.829 m)   Wt 202 lb 12.8 oz (92 kg)   SpO2 98%   BMI 27.50 kg/m       General: Alert, oriented x3, no distress, overweight.  Well-healed right subclavian pacemaker site Head: no evidence of trauma, PERRL, EOMI, no exophtalmos or lid lag, no myxedema, no xanthelasma; normal ears, nose and oropharynx Neck: normal jugular venous pulsations and no hepatojugular reflux; brisk carotid pulses without delay and no carotid bruits Chest: clear to auscultation, no signs of consolidation by percussion or palpation, normal fremitus, symmetrical and full respiratory excursions Cardiovascular: normal position and quality of the apical impulse, regular rhythm, normal first and second heart sounds, no murmurs, rubs or gallops Abdomen: no  tenderness or distention, no masses by palpation, no abnormal pulsatility or arterial bruits, normal bowel sounds, no hepatosplenomegaly Extremities: no clubbing, cyanosis or edema; 2+ radial, ulnar and brachial pulses bilaterally; 2+ right femoral, posterior tibial and dorsalis pedis pulses; 2+ left femoral, posterior tibial and dorsalis pedis pulses; no subclavian or femoral bruits Neurological: grossly nonfocal Psych: Normal mood and affect    Wt Readings from Last 3 Encounters:  08/24/21 202 lb 12.8 oz (92 kg)  05/14/21 196 lb (88.9 kg)  04/19/21 200 lb 3.2 oz (90.8 kg)      Studies/Labs Reviewed:   EKG:  EKG is not ordered today.  The intracardiac electrogram shows atrial sensed (sinus), ventricular sensed rhythm at about 70 bpm.  ECG from 12/30/2020 shows mostly sinus rhythm with right bundle branch block, but also shows a few ventricular paced beats.  Today's intracardiac electrogram during pacemaker check shows atrial fibrillation with controlled ventricular response, mostly ventricular sensed beats, rare ventricular pacing.  CTA 05/24/2019: 1. Stable mild aneurysmal disease of the ascending thoracic aorta with maximal diameter of approximately 4.2 cm. 2. Stable coronary atherosclerosis. 3. Stable 5 mm right upper lobe and 6 mm right middle lobe pulmonary nodules. 4. Stable to minimally larger ectasia/mild aneurysmal disease of the distal abdominal aorta measuring 2.8 x 3.0 cm. 5. Non critical and stable appearing roughly 40% stenosis of the proximal SMA trunk and 40-50% stenosis of the proximal right renal artery. 6. Small left inguinal hernia containing fat.  BMET    Component Value Date/Time   NA 141 05/04/2021 0801   K 4.2 05/04/2021 0801   CL 103 05/04/2021 0801   CO2 23 05/04/2021 0801   GLUCOSE 135 (H) 05/04/2021 0801   GLUCOSE 88 07/05/2018 0533   BUN 23 05/04/2021 0801   CREATININE 1.25 05/04/2021 0801   CREATININE 1.19 01/16/2017 0846   CALCIUM 9.8  05/04/2021 0801   GFRNONAA 65 08/09/2019 0912   GFRAA 75 08/09/2019 0912   Lipid Panel     Component Value Date/Time   CHOL 123 05/04/2021 0801   TRIG 71 05/04/2021 0801   HDL 37 (L) 05/04/2021  0801   CHOLHDL 3.3 05/04/2021 0801   LDLCALC 71 05/04/2021 0801   LABVLDL 15 05/04/2021 0801     ASSESSMENT:    1. Paroxysmal atrial fibrillation (HCC)   2. Tachycardia-bradycardia syndrome ([place])   3. Long term current use of anticoagulant therapy   4. Neurocardiogenic syncope   5. Aneurysm of ascending aorta without rupture   6. Infrarenal abdominal aortic aneurysm (AAA) without rupture   7. Essential hypertension   8. Coronary artery calcification seen on CT scan   9. Left ventricular dysfunction   10. Dyslipidemia (high LDL; low HDL)   11. Hypersomnolence      PLAN:  In order of problems listed above:  PAF: The burden of arrhythmia remains relatively high, but is always been completely asymptomatic.  Increase carvedilol to 50 mg twice daily a since he needs more medication for ventricular rate control.  Since he is asymptomatic, would like to avoid antiarrhythmics, especially since he has mildly depressed left ventricular systolic function, which would limit our choices of antiarrhythmics.  He is appropriately anticoagulated. CHADS Vasc 5 (TIA 2, HTN, vascular disease, LV dysfunction). Tachy-brady sd: very infrequent need for pacing, Warfarin: [place] with INR monitoring.  Denies bleeding complications or falls..  Has a remote history of TIA. Neurally mediated syncope: Improved after we made the sudden bradycardia response on his pacemaker more aggressive.  Generally we should tolerate systolic blood pressures in the 140-150 mmHg range since he is prone to syncope.  Prefer the use of beta-blockers over vasodilators (like amlodipine) and especially would want to avoid diuretics.   PPM: Normal device function.  Remote downloads every 3 months. Asc Ao aneurysm: Asymptomatic.  Periodic CT  angiography has not shown any evidence of increase in size (ascending aorta diameter 4.2 in 2018, 4.2 in 2020, 4.1 in 2022).  We will increase the interval between CT angiography to 3-5 years. AAA: Conversely, CT angiography of his abdominal aorta shows a slow steady increase in aneurysm size from 2.8 in 2018-3.0 in 2020, now 3.4 in April 2022.  We will recheck this with ultrasonography next year.  Ideally would like his blood pressure to be quite low to prevent aneurysm growth, but this has been associated with unacceptable increase in the frequency of syncope. HTN: Increase carvedilol to 50 mg twice daily.  Stop amlodipine.  Allow systolic blood pressure in the 140-150 range. Coronary Ca: He denies exertional angina or exertional dyspnea.  He had a normal nuclear stress test in the past (2012).  The focus is on risk factor management.  On statin. LV dysfunction: Clinically euvolemic without diuretics,  NYHA functional class I. EF around 43% based on previous echo and nuclear stress test, but he has never had clinical manifestations of heart failure.  He is on maximum doses of ARB and carvedilol.  Avoid diuretics due to his history of recurrent vasovagal syncope. HLP: LDL is at target on statin, but HDL is chronically low.  This will only worsen if he continues to gain weight.  Encourage more physical activity (walking) and reducing unhealthy foods in his diet such as sweets, starches with high glycemic index, saturated fat. Hypersomnolence: Strong suspicion for obstructive sleep apnea which could be promoting both his atrial fibrillation and high blood pressure.  Scheduled for sleep study.  Medication Adjustments/Labs and Tests Ordered: Current medicines are reviewed at length with the patient today.  Concerns regarding medicines are outlined above.  Medication changes, Labs and Tests ordered today are listed in the Patient Instructions  below. Patient Instructions  Medication Instructions:  INCREASE  the Carvedilol to 50 mg twice daily STOP the Amlodipine  *If you need a refill on your cardiac medications before your next appointment, please call your pharmacy*   Lab Work: None ordered If you have labs (blood work) drawn today and your tests are completely normal, you will receive your results only by: [place] (if you have MyChart) OR A paper copy in the mail If you have any lab test that is abnormal or we need to change your treatment, we will call you to review the results.   Testing/Procedures: Your physician has recommended that you have a sleep study. This test records several body functions during sleep, including: brain activity, eye movement, oxygen and carbon dioxide blood levels, heart rate and rhythm, breathing rate and rhythm, the flow of air through your mouth and nose, snoring, body muscle movements, and chest and belly movement.  Follow-Up: At [facility], you and your health needs are our priority.  As part of our continuing mission to provide you with exceptional heart care, we have created designated Provider Care Teams.  These Care Teams include your primary Cardiologist (physician) and Advanced Practice Providers (APPs -  Physician Assistants and Nurse Practitioners) who all work together to provide you with the care you need, when you need it.  We recommend signing up for the patient portal called "MyChart".  Sign up information is provided on this After Visit Summary.  MyChart is used to connect with patients for Virtual Visits (Telemedicine).  Patients are able to view lab/test results, encounter notes, upcoming appointments, etc.  Non-urgent messages can be sent to your provider as well.   To learn more about what you can do with MyChart, go to [email or web address].    Your next appointment:   6 month(s)  The format for your next appointment:   In Person  Provider:   Sanda Klein, MD  Dr. Sallyanne Kuster would like you to check your blood pressure  daily for the next 2 weeks.  Keep a journal of these daily blood pressure and heart rate readings and call our office or send a message through [place] with the results. Thank you!  It is best to check your BP 1-2 hours after taking your medications to see the medications effectiveness on your BP.    Here are some tips that our clinical pharmacists share for home BP monitoring:          Rest 10 minutes before taking your blood pressure.          Don't smoke or drink caffeinated beverages for at least 30 minutes before.          Take your blood pressure before (not after) you eat.          Sit comfortably with your back supported and both feet on the floor (don't cross your legs).          Elevate your arm to heart level on a table or a desk.          Use the proper sized cuff. It should fit smoothly and snugly around your bare upper arm. There should be enough room to slip a fingertip under the cuff. The bottom edge of the cuff should be 1 inch above the crease of the elbow.      Signed, Sanda Klein, MD  08/24/2021 12:47 PM    [address], [place], [place]  [address] Phone: [phone]; Fax: [phone]

## 2021-08-24 NOTE — Patient Instructions (Addendum)
Medication Instructions:  INCREASE the Carvedilol to 50 mg twice daily STOP the Amlodipine  *If you need a refill on your cardiac medications before your next appointment, please call your pharmacy*   Lab Work: None ordered If you have labs (blood work) drawn today and your tests are completely normal, you will receive your results only by: [place] (if you have MyChart) OR A paper copy in the mail If you have any lab test that is abnormal or we need to change your treatment, we will call you to review the results.   Testing/Procedures: Your physician has recommended that you have a sleep study. This test records several body functions during sleep, including: brain activity, eye movement, oxygen and carbon dioxide blood levels, heart rate and rhythm, breathing rate and rhythm, the flow of air through your mouth and nose, snoring, body muscle movements, and chest and belly movement.  Follow-Up: At [facility], you and your health needs are our priority.  As part of our continuing mission to provide you with exceptional heart care, we have created designated Provider Care Teams.  These Care Teams include your primary Cardiologist (physician) and Advanced Practice Providers (APPs -  Physician Assistants and Nurse Practitioners) who all work together to provide you with the care you need, when you need it.  We recommend signing up for the patient portal called "MyChart".  Sign up information is provided on this After Visit Summary.  MyChart is used to connect with patients for Virtual Visits (Telemedicine).  Patients are able to view lab/test results, encounter notes, upcoming appointments, etc.  Non-urgent messages can be sent to your provider as well.   To learn more about what you can do with MyChart, go to [email or web address].    Your next appointment:   6 month(s)  The format for your next appointment:   In Person  Provider:   Sanda Klein, MD  Dr. Sallyanne Kuster would  like you to check your blood pressure daily for the next 2 weeks.  Keep a journal of these daily blood pressure and heart rate readings and call our office or send a message through [place] with the results. Thank you!  It is best to check your BP 1-2 hours after taking your medications to see the medications effectiveness on your BP.    Here are some tips that our clinical pharmacists share for home BP monitoring:          Rest 10 minutes before taking your blood pressure.          Don't smoke or drink caffeinated beverages for at least 30 minutes before.          Take your blood pressure before (not after) you eat.          Sit comfortably with your back supported and both feet on the floor (don't cross your legs).          Elevate your arm to heart level on a table or a desk.          Use the proper sized cuff. It should fit smoothly and snugly around your bare upper arm. There should be enough room to slip a fingertip under the cuff. The bottom edge of the cuff should be 1 inch above the crease of the elbow.

## 2021-08-27 ENCOUNTER — Telehealth: Payer: Self-pay | Admitting: Cardiovascular Disease

## 2021-08-27 ENCOUNTER — Other Ambulatory Visit: Payer: Self-pay | Admitting: Cardiovascular Disease

## 2021-08-27 ENCOUNTER — Telehealth: Payer: Self-pay | Admitting: *Deleted

## 2021-08-27 DIAGNOSIS — I10 Essential (primary) hypertension: Secondary | ICD-10-CM

## 2021-08-27 DIAGNOSIS — R400 Somnolence: Secondary | ICD-10-CM

## 2021-08-27 DIAGNOSIS — I480 Paroxysmal atrial fibrillation: Secondary | ICD-10-CM

## 2021-08-27 NOTE — Telephone Encounter (Signed)
Spoke with wife, ok per DPR patient not home Blood pressure readings below   11/15  113/75 HR 67  123/88 HR 66 11/16  131/82 HR 61 11/17  128/94 HR 60  113/91 HR 100  11/18  130/92 HR 60  137/92 HR 60 30 minutes ago  Last night when his HR was 100 he could feel his heart pounding  Patient not available to speak with   Will forward to Dr Sallyanne Kuster for review

## 2021-08-27 NOTE — Telephone Encounter (Signed)
-----   Message from [name], RN sent at 08/24/2021 10:28 AM EST ----- The patient will need a split night sleep study-thank you

## 2021-08-27 NOTE — Telephone Encounter (Signed)
Left sleep study appointment details on home voice mail.

## 2021-08-27 NOTE — Telephone Encounter (Signed)
Pt c/o medication issue:  1. Name of Medication: carvedilol (COREG) 25 MG tablet  2. How are you currently taking this medication (dosage and times per day)?  Take 2 tablets (50 mg total) by mouth 2 (two) times daily with a meal.  3. Are you having a reaction (difficulty breathing--STAT)? no  4. What is your medication issue? Pt says that he hasnt seen a change since taking this medication and would like to be advised further on how to take it.

## 2021-08-30 NOTE — Telephone Encounter (Signed)
August 27, 2021 Sanda Klein, MD to Me  [name], RN    5:18 PM The increase in carvedilol was to serve 2 purposes:  1. Better rate control during Afib episodes. It will take until his next pacemaker download to see how much that is helping).  2. To lower the BP to a safe range without increasing his risk of passing out from vagal syncope.   Although that diastolic BP is a little higher than perfect, I am OK with it. Would continue the carvedilol 50 mg twice a day.  Thanks!   Discussed with wife  Yesterday after church patient stood up and was talking to someone he became pale "almost passed out" and needed to sit down. Got him down brought his knees up and recovered quickly  Unable to check blood pressure at that time, when home from church 127/87 HR 66  Yesterday morning about an hour after morning medications blood pressure 141/104 HR 71, 7:15 pm blood pressure 127/87 HR 66  This am about an hour after medications 155/106 HR 60  Advised continue current regimen and would forward to Dr Keene Breath, will call back if any further recommendations

## 2021-08-30 NOTE — Telephone Encounter (Signed)
Patient's wife has been made aware of Dr. Victorino December recommendations. She will keep track of his blood pressure and call back with any concerns.

## 2021-09-08 ENCOUNTER — Ambulatory Visit: Payer: Medicare Other | Admitting: *Deleted

## 2021-09-08 DIAGNOSIS — Z5181 Encounter for therapeutic drug level monitoring: Secondary | ICD-10-CM

## 2021-09-08 DIAGNOSIS — I4891 Unspecified atrial fibrillation: Secondary | ICD-10-CM

## 2021-09-08 LAB — POCT INR: INR: 2.6 (ref 2.0–3.0)

## 2021-09-08 NOTE — Patient Instructions (Signed)
Continue warfarin 1 tablet daily except 1/2 tablet on Mondays, Wednesdays and Fridays Continue greens Recheck in 6 weeks

## 2021-09-10 ENCOUNTER — Ambulatory Visit: Payer: Medicare Other | Admitting: Neurology

## 2021-09-10 ENCOUNTER — Encounter: Payer: Self-pay | Admitting: Cardiovascular Disease

## 2021-09-24 ENCOUNTER — Encounter: Payer: Medicare Other | Admitting: Cardiovascular Disease

## 2021-09-27 ENCOUNTER — Telehealth: Payer: Self-pay

## 2021-09-27 NOTE — Telephone Encounter (Signed)
Letter has been sent to patient instructing them to call us if they are still interested in completing their sleep study. If we have not received a response from the patient within 30 days of this notice, the order will be cancelled and they will need to discuss the need for a sleep study at their next office visit.  ° °

## 2021-09-30 ENCOUNTER — Ambulatory Visit (INDEPENDENT_AMBULATORY_CARE_PROVIDER_SITE_OTHER): Payer: Medicare Other

## 2021-09-30 DIAGNOSIS — I495 Sick sinus syndrome: Secondary | ICD-10-CM

## 2021-09-30 LAB — CUP PACEART REMOTE DEVICE CHECK
Battery Remaining Longevity: 166 mo
Battery Voltage: 3.14 V
Brady Statistic AP VP Percent: 6.73 %
Brady Statistic AP VS Percent: 18.27 %
Brady Statistic AS VP Percent: 0.16 %
Brady Statistic AS VS Percent: 74.88 %
Brady Statistic RA Percent Paced: 21.54 %
Brady Statistic RV Percent Paced: 7.5 %
Date Time Interrogation Session: 20221222050921
Implantable Lead Implant Date: 20120820
Implantable Lead Implant Date: 20120820
Implantable Lead Location: 753859
Implantable Lead Location: 753860
Implantable Pulse Generator Implant Date: 20220323
Lead Channel Impedance Value: 342 Ohm
Lead Channel Impedance Value: 361 Ohm
Lead Channel Impedance Value: 399 Ohm
Lead Channel Impedance Value: 437 Ohm
Lead Channel Pacing Threshold Amplitude: 0.875 V
Lead Channel Pacing Threshold Amplitude: 1.25 V
Lead Channel Pacing Threshold Pulse Width: 0.4 ms
Lead Channel Pacing Threshold Pulse Width: 0.4 ms
Lead Channel Sensing Intrinsic Amplitude: 6.5 mV
Lead Channel Sensing Intrinsic Amplitude: 6.5 mV
Lead Channel Sensing Intrinsic Amplitude: 7 mV
Lead Channel Sensing Intrinsic Amplitude: 7 mV
Lead Channel Setting Pacing Amplitude: 1.75 V
Lead Channel Setting Pacing Amplitude: 2.75 V
Lead Channel Setting Pacing Pulse Width: 0.4 ms
Lead Channel Setting Sensing Sensitivity: 0.9 mV

## 2021-10-01 ENCOUNTER — Ambulatory Visit
Admission: EM | Admit: 2021-10-01 | Discharge: 2021-10-01 | Disposition: A | Discharge status: Home / Self Care | Payer: Medicare Other | Attending: Physician Assistant | Admitting: Physician Assistant

## 2021-10-01 ENCOUNTER — Ambulatory Visit (INDEPENDENT_AMBULATORY_CARE_PROVIDER_SITE_OTHER): Payer: Medicare Other

## 2021-10-01 ENCOUNTER — Other Ambulatory Visit: Payer: Self-pay

## 2021-10-01 DIAGNOSIS — M722 Plantar fascial fibromatosis: Secondary | ICD-10-CM | POA: Diagnosis not present

## 2021-10-01 DIAGNOSIS — S9031XA Contusion of right foot, initial encounter: Secondary | ICD-10-CM

## 2021-10-01 NOTE — ED Provider Notes (Signed)
[facility]    CSN: [id number] Arrival date & time: 10/01/21  1156      History   Chief Complaint Chief Complaint  Patient presents with   Foot Pain    Right foot pain    HPI Alex Maddox is a 70 y.o. male.   Pt reports he has planar fascitis in his feet.  He is followed by Dr. Milinda Pointer in [place].  Pt reports he jumped over a creek and now has worsening discomfort   The history is provided by the patient. No language interpreter was used.  Foot Pain This is a new problem. The problem occurs constantly. The problem has been gradually worsening. Nothing aggravates the symptoms. He has tried nothing for the symptoms. The treatment provided no relief.   Past Medical History:  Diagnosis Date   Abnormal echocardiogram    stress myoview 10/29/2010   Arthritis    " IN MY BACK "   Dyslipidemia    Dysrhythmia    Atrial fibrillation   Hypertension    Paroxysmal atrial fibrillation (HCC)    Presence of permanent cardiac pacemaker    Sinus node dysfunction (HCC)    Sleep apnea, obstructive    sleep study 10/09/2007  AHI 5.73/hr  REM AHI 11.90/hr    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   LEFT THUMB    Stroke [facility])    Syncope    2D Echocardiogram 09/27/2010 EF between 40 to 45%   Tachycardia-bradycardia ([place])    Transient ischemic attack (TIA) 06/07    Patient Active Problem List   Diagnosis Date Noted   Pacemaker battery depletion 12/30/2020   Tachycardia-bradycardia syndrome ([place]) 12/30/2020   History of colonic polyps 07/10/2018   Sepsis ([place]) 06/29/2018   Fever in adult >> 103 06/29/2018   Acute Toxic metabolic encephalopathy due to infection 06/29/2018   Suspected CNS infection 06/29/2018   Thrombocytopenia ([place]) 09/25/2017   Guaiac + stool 05/15/2017   Left ventricular dysfunction 12/30/2015   Coronary artery calcification seen on CT scan 09/08/2014   Ascending aortic aneurysm 09/08/2014   AAA (abdominal aortic aneurysm) without rupture  09/08/2014   History of TIA (transient ischemic attack) 09/08/2014   Rib fractures    Head injury    Fall    ICH (intracerebral hemorrhage) ([place])    HLD (hyperlipidemia)    Essential hypertension    Syncope 08/13/2014   Intracranial hemorrhage ([place]) 08/13/2014   Anticoagulated on Coumadin    Left rib fracture 08/12/2014   Encounter for therapeutic drug monitoring 11/07/2013   Neurocardiogenic syncope 08/26/2013   Pacemaker - Medtronic REVO dual-chamber implanted August 2012 08/26/2013   HTN (hypertension) 08/26/2013   Hyperlipidemia 08/26/2013   Erectile dysfunction 08/26/2013   Atrial fibrillation ([place]) 12/14/2012   Long term current use of anticoagulant therapy 12/14/2012    Past Surgical History:  Procedure Laterality Date   COLONOSCOPY     COLONOSCOPY N/A 06/07/2017   Procedure: COLONOSCOPY;  Surgeon: Rogene Houston, MD;  [facility];  Service: Endoscopy;  Laterality: N/A;  9:30   COLONOSCOPY N/A 09/20/2018   Procedure: COLONOSCOPY;  Surgeon: Rogene Houston, MD;  [facility];  Service: Endoscopy;  Laterality: N/A;  [place] / REPLACE / REMOVE PACEMAKER  05/2011   LOOP RECORDER IMPLANT  02/02/2011   Medtronic- CRM    PACEMAKER INSERTION  05/30/2011   Medtronic Revo   last checked 11/07/2012  POLYPECTOMY  09/20/2018   Procedure: POLYPECTOMY;  Surgeon: Rogene Houston, MD;  Location: [facility];  Service: Endoscopy;;  colon   PPM GENERATOR CHANGEOUT N/A 12/30/2020   Procedure: PPM GENERATOR CHANGEOUT;  Surgeon: Sanda Klein, MD;  Location: [facility];  Service: Cardiovascular;  Laterality: N/A;       Home Medications    Prior to Admission medications   Medication Sig Start Date End Date Taking? Authorizing Provider  carvedilol (COREG) 25 MG tablet Take 2 tablets (50 mg total) by mouth 2 (two) times daily with a meal. 08/24/21   Croitoru, Mihai, MD  diazepam (VALIUM) 2 MG tablet Take 2 mg by mouth  every 8 (eight) hours as needed for anxiety.     [provider]  finasteride (PROSCAR) 5 MG tablet Take 5 mg by mouth daily.    [provider]  Flaxseed Oil OIL Take 1,000 mg by mouth daily.    [provider]  irbesartan (AVAPRO) 300 MG tablet Take 1 tablet (300 mg total) by mouth daily. 02/19/21   Croitoru, Mihai, MD  Misc Natural Products (NEURIVA) CAPS Take 1 capsule by mouth daily. For memory    [provider]  Multiple Vitamin (MULTIVITAMIN WITH MINERALS) TABS tablet Take 1 tablet by mouth daily. Adult Multivitamin 50+    [provider]  Omega-3 Fatty Acids (FISH OIL) 1000 MG CAPS Take 1,000 mg by mouth daily.    [provider]  Polyethyl Glycol-Propyl Glycol 0.4-0.3 % SOLN Place 1 drop into both eyes 3 (three) times daily as needed (for dry eyes.).    [provider]  rosuvastatin (CRESTOR) 20 MG tablet TAKE ONE TABLET (20MG  TOTAL) BY MOUTH DAILY 08/04/21   Croitoru, Mihai, MD  Saw Palmetto 450 MG CAPS Take 900 mg by mouth daily.    [provider]  trospium (SANCTURA) 20 MG tablet Take 20 mg by mouth daily.    [provider]  warfarin (COUMADIN) 5 MG tablet TAKE ONE TABLET BY MOUTH EVERY DAY AS DIRECTED BY COUMADIN CLINIC 04/08/21   Croitoru, Mihai, MD    Family History Family History  Problem Relation Age of Onset   Hypertension Mother    Heart attack Father 86       deceased from heart attack   Hypertension Brother    Colon cancer Neg Hx     Social History Social History   Tobacco Use   Smoking status: Never   Smokeless tobacco: Never  Vaping Use   Vaping Use: Never used  Substance Use Topics   Alcohol use: Yes    Comment: occasionally wine   Drug use: No     Allergies   Patient has no known allergies.   Review of Systems Review of Systems  Musculoskeletal:  Positive for myalgias. Negative for joint swelling.  Skin:  Negative for color change.  All other systems reviewed and  are negative.   Physical Exam Triage Vital Signs ED Triage Vitals  Enc Vitals Group     BP 10/01/21 1235 94/60     Pulse Rate 10/01/21 1235 72     Resp 10/01/21 1235 18     Temp 10/01/21 1235 97.8 F (36.6 C)     Temp Source 10/01/21 1235 Oral     SpO2 10/01/21 1235 98 %     Weight --      Height --      Head Circumference --      Peak Flow --  Pain Score 10/01/21 1232 9     Pain Loc --      Pain Edu? --      Excl. in [place]? --    No data found.  Updated Vital Signs BP 94/60 (BP Location: Right Arm)    Pulse 72    Temp 97.8 F (36.6 C) (Oral)    Resp 18    SpO2 98%   Visual Acuity Right Eye Distance:   Left Eye Distance:   Bilateral Distance:    Right Eye Near:   Left Eye Near:    Bilateral Near:     Physical Exam Vitals reviewed.  Constitutional:      Appearance: Normal appearance.  Musculoskeletal:        General: Swelling and tenderness present.     Comments: Tender bottom of foot,  pain with range of motion   Skin:    General: Skin is warm.  Neurological:     General: No focal deficit present.     Mental Status: He is alert.  Psychiatric:        Mood and Affect: Mood normal.     UC Treatments / Results  Labs (all labs ordered are listed, but only abnormal results are displayed) Labs Reviewed - No data to display  EKG   Radiology DG Foot Complete Right  Result Date: 10/01/2021 CLINICAL DATA:  Plantar fasciitis EXAM: RIGHT FOOT COMPLETE - 3+ VIEW COMPARISON:  None. FINDINGS: No fracture or dislocation is seen. The joint spaces are preserved. Visualized soft tissues are within normal limits. Small plantar calcaneal enthesophyte. IMPRESSION: Negative. Electronically Signed   By: Julian Hy M.D.   On: 10/01/2021 13:15   CUP PACEART REMOTE DEVICE CHECK  Result Date: 09/30/2021 Scheduled remote reviewed. Normal device function.  Known PAF, on OAC, AF burden is 14.1% of the time Next remote 91 days. Kathy Breach, RN, CCDS, CV Remote  Solutions   Procedures Procedures (including critical care time)  Medications Ordered in UC Medications - No data to display  Initial Impression / Assessment and Plan / UC Course  I have reviewed the triage vital signs and the nursing notes.  Pertinent labs & imaging results that were available during my care of the patient were reviewed by me and considered in my medical decision making (see chart for details).     MDM:  Xray  no fracture.  Pt advised to follow up with his podiatrist fpr recheck  Final Clinical Impressions(s) / UC Diagnoses   Final diagnoses:  Contusion of right foot, initial encounter   Discharge Instructions   None    ED Prescriptions   None    PDMP not reviewed this encounter. An After Visit Summary was printed and given to the patient.    Fransico Meadow, [place] 10/08/21 [phone]

## 2021-10-01 NOTE — Discharge Instructions (Signed)
Wear your boot the podiatrist put you in  Return if any problems.

## 2021-10-01 NOTE — ED Triage Notes (Signed)
Patient states he has planter fascitis in his right foot.   Patient states he jumped across the creek and he thinks that is when he hurt his foot.

## 2021-10-12 NOTE — Progress Notes (Signed)
Remote pacemaker transmission.   

## 2021-10-14 ENCOUNTER — Other Ambulatory Visit: Payer: Self-pay

## 2021-10-14 ENCOUNTER — Encounter: Payer: Self-pay | Admitting: Podiatry

## 2021-10-14 ENCOUNTER — Ambulatory Visit: Payer: Medicare Other | Admitting: Podiatry

## 2021-10-14 DIAGNOSIS — M722 Plantar fascial fibromatosis: Secondary | ICD-10-CM

## 2021-10-14 DIAGNOSIS — M76821 Posterior tibial tendinitis, right leg: Secondary | ICD-10-CM

## 2021-10-14 MED ORDER — TRIAMCINOLONE ACETONIDE 40 MG/ML IJ SUSP
20.0000 mg | Freq: Once | INTRAMUSCULAR | Status: AC
  Administered 2021-10-14: 20 mg

## 2021-10-14 NOTE — Progress Notes (Signed)
He presents today states that his right foot is bothering him after he jumped over a creek while hunting.  Objective: Vital signs are stable he is alert oriented x3 pain on palpation MucoClear tubercle of the right heel.  No pain on medial lateral compression of the calcaneus he does have some tenderness on palpation posterior tibial tendon.  No Achilles pain.  Assessment: Planter fasciitis right.  Plan: Injected the area today Kenalog local anesthetic.

## 2021-10-20 ENCOUNTER — Other Ambulatory Visit: Payer: Self-pay

## 2021-10-20 ENCOUNTER — Ambulatory Visit: Payer: Medicare Other | Admitting: *Deleted

## 2021-10-20 DIAGNOSIS — I4891 Unspecified atrial fibrillation: Secondary | ICD-10-CM | POA: Diagnosis not present

## 2021-10-20 DIAGNOSIS — Z5181 Encounter for therapeutic drug level monitoring: Secondary | ICD-10-CM | POA: Diagnosis not present

## 2021-10-20 LAB — POCT INR: INR: 2.3 (ref 2.0–3.0)

## 2021-10-20 NOTE — Patient Instructions (Signed)
Continue warfarin 1 tablet daily except 1/2 tablet on Mondays, Wednesdays and Fridays Continue greens Recheck in 6 weeks

## 2021-10-25 ENCOUNTER — Ambulatory Visit: Payer: Medicare Other | Attending: Cardiovascular Disease | Admitting: Cardiovascular Disease

## 2021-10-25 ENCOUNTER — Other Ambulatory Visit: Payer: Self-pay

## 2021-10-25 DIAGNOSIS — R0683 Snoring: Secondary | ICD-10-CM | POA: Diagnosis not present

## 2021-10-25 DIAGNOSIS — I480 Paroxysmal atrial fibrillation: Secondary | ICD-10-CM

## 2021-10-25 DIAGNOSIS — G4733 Obstructive sleep apnea (adult) (pediatric): Secondary | ICD-10-CM | POA: Insufficient documentation

## 2021-10-25 DIAGNOSIS — R400 Somnolence: Secondary | ICD-10-CM

## 2021-10-25 DIAGNOSIS — I10 Essential (primary) hypertension: Secondary | ICD-10-CM | POA: Insufficient documentation

## 2021-10-25 DIAGNOSIS — I493 Ventricular premature depolarization: Secondary | ICD-10-CM | POA: Insufficient documentation

## 2021-10-26 DIAGNOSIS — E870 Hyperosmolality and hypernatremia: Secondary | ICD-10-CM | POA: Diagnosis not present

## 2021-10-26 DIAGNOSIS — N1831 Chronic kidney disease, stage 3a: Secondary | ICD-10-CM | POA: Diagnosis not present

## 2021-10-26 DIAGNOSIS — I129 Hypertensive chronic kidney disease with stage 1 through stage 4 chronic kidney disease, or unspecified chronic kidney disease: Secondary | ICD-10-CM | POA: Diagnosis not present

## 2021-10-26 DIAGNOSIS — D696 Thrombocytopenia, unspecified: Secondary | ICD-10-CM | POA: Diagnosis not present

## 2021-11-02 DIAGNOSIS — N182 Chronic kidney disease, stage 2 (mild): Secondary | ICD-10-CM | POA: Diagnosis not present

## 2021-11-02 DIAGNOSIS — R809 Proteinuria, unspecified: Secondary | ICD-10-CM | POA: Diagnosis not present

## 2021-11-02 DIAGNOSIS — I5032 Chronic diastolic (congestive) heart failure: Secondary | ICD-10-CM | POA: Diagnosis not present

## 2021-11-02 DIAGNOSIS — D696 Thrombocytopenia, unspecified: Secondary | ICD-10-CM | POA: Diagnosis not present

## 2021-11-14 ENCOUNTER — Encounter: Payer: Self-pay | Admitting: Cardiovascular Disease

## 2021-11-14 NOTE — Procedures (Signed)
[facility]               Patient Name: Alex Maddox, Alex Maddox Date: 10/25/2021 Gender: Male D.O.B: Jun 09, 1951 Age (years): 33 Referring Provider: Dani Gobble Croitoru Height (inches): 72 Interpreting Physician: Shelva Majestic MD, ABSM Weight (lbs): 198 RPSGT: Rosebud Poles BMI: 27 MRN: [id number] Neck Size: 17.00  CLINICAL INFORMATION Sleep Study Type: NPSG  Indication for sleep study: snoring, fatige, AF, non-restorative sleep, hypersomnolence  Epworth Sleepiness Score: 15  SLEEP STUDY TECHNIQUE As per the AASM Manual for the Scoring of Sleep and Associated Events v2.3 (April 2016) with a hypopnea requiring 4% desaturations.  The channels recorded and monitored were frontal, central and occipital EEG, electrooculogram (EOG), submentalis EMG (chin), nasal and oral airflow, thoracic and abdominal wall motion, anterior tibialis EMG, snore microphone, electrocardiogram, and pulse oximetry.  MEDICATIONS carvedilol (COREG) 25 MG tablet diazepam (VALIUM) 2 MG tablet finasteride (PROSCAR) 5 MG tablet Flaxseed Oil OIL irbesartan (AVAPRO) 300 MG tablet Misc Natural Products (NEURIVA) CAPS Multiple Vitamin (MULTIVITAMIN WITH MINERALS) TABS tablet Omega-3 Fatty Acids (FISH OIL) 1000 MG CAPS Polyethyl Glycol-Propyl Glycol 0.4-0.3 % SOLN rosuvastatin (CRESTOR) 20 MG tablet Saw Palmetto 450 MG CAPS trospium (SANCTURA) 20 MG tablet warfarin (COUMADIN) 5 MG tablet  Medications self-administered by patient taken the night of the study : N/A  SLEEP ARCHITECTURE The study was initiated at 10:01:35 PM and ended at 6:04:32 AM.  Sleep onset time was 50.2 minutes and the sleep efficiency was 78.3%. The total sleep time was 378.2 minutes.  Stage REM latency was 99.0 minutes.  The patient spent 1.72% of the night in stage N1 sleep, 31.65% in stage N2 sleep, 37.81% in stage N3 and 28.8% in REM.  Alpha intrusion was absent.  Supine sleep was 30.30%.  RESPIRATORY  PARAMETERS The overall apnea/hypopnea index (AHI) was 10.9 per hour. The respiratory disturbance index (RDI) was 11.1/h. There were 16 total apneas, including 16 obstructive, 0 central and 0 mixed apneas. There were 53 hypopneas and 1 RERAs.  The AHI during Stage REM sleep was 22.0 per hour.  AHI while supine was 28.3 per hour.  The mean oxygen saturation was 94.04%. The minimum SpO2 during sleep was 82.00%.  Loud snoring was noted during this study.  CARDIAC DATA The 2 lead EKG demonstrated sinus rhythm. The mean heart rate was 62.10 beats per minute. Other EKG findings include: PVCs.  LEG MOVEMENT DATA The total PLMS were 1557 with a resulting PLMS index of 247.01. Associated arousal with leg movement index was 3.5 .  IMPRESSIONS - Mild obstructive sleep apnea overall (AHI10.9/h; RDI 11.1/h); however, sleep apnea was moderate during REM sleep (AHI 22.0/h and with supine sleep (AHI 28.3/h). - Moderate oxygen desaturation to a nadir of 82%. - The patient snored with loud snoring volume. - EKG findings include PVCs. - Severe periodic limb movements of sleep occurred during the study. No significant associated arousals.  DIAGNOSIS - Obstructive Sleep Apnea (G47.33)  RECOMMENDATIONS - In this patient with significant cardiovascular comorbidities recommend an in-lab therapeutic CPAP titration to determine optimal pressure required to alleviate sleep disordered breathing. If unable to schedule an in-lab titration, initiate Auto-PAP with EPR of 3 at 6 - 18 cm of water. - Effort should be made to optimize nasal and oropharyngeal patency. - Positional therapy avoiding supine position during sleep. - Avoid alcohol, sedatives  and other CNS depressants that may worsen sleep apnea and disrupt normal sleep architecture. - Sleep hygiene should be reviewed to assess factors that may improve sleep quality. - Weight management and regular exercise should be initiated or continued if  appropriate.  [Electronically signed] 11/14/2021 07:18 PM  Shelva Majestic MD, [facility], [place], American Board of Sleep Medicine   NPI: [id number] [place] PH: [phone]   FX: [phone] [place]

## 2021-11-15 ENCOUNTER — Ambulatory Visit: Payer: Medicare Other | Admitting: Neurology

## 2021-11-17 ENCOUNTER — Other Ambulatory Visit: Payer: Self-pay | Admitting: Cardiovascular Disease

## 2021-11-17 ENCOUNTER — Telehealth: Payer: Self-pay | Admitting: *Deleted

## 2021-11-17 DIAGNOSIS — G4733 Obstructive sleep apnea (adult) (pediatric): Secondary | ICD-10-CM

## 2021-11-17 NOTE — Telephone Encounter (Signed)
Left message with the patient's wife to have him to return a call to me. (  Need to discuss sleep study results)

## 2021-11-23 ENCOUNTER — Other Ambulatory Visit: Payer: Self-pay

## 2021-11-23 ENCOUNTER — Ambulatory Visit: Payer: Medicare Other | Admitting: Podiatry

## 2021-11-23 ENCOUNTER — Encounter: Payer: Self-pay | Admitting: Podiatry

## 2021-11-23 DIAGNOSIS — M722 Plantar fascial fibromatosis: Secondary | ICD-10-CM

## 2021-11-23 NOTE — Progress Notes (Signed)
He presents today for follow-up of his Planter fasciitis states that is 100% and better.  Objective: Vital signs stable alert and oriented x3 there is no pain on palpation mid continue typical of the right foot.  No pain on palpation of the heel.  Assessment: Resolving plantar fasciitis.  Plan: Follow-up with me on an as-needed basis.

## 2021-11-26 DIAGNOSIS — M1991 Primary osteoarthritis, unspecified site: Secondary | ICD-10-CM | POA: Diagnosis not present

## 2021-11-26 DIAGNOSIS — H6122 Impacted cerumen, left ear: Secondary | ICD-10-CM | POA: Diagnosis not present

## 2021-11-26 DIAGNOSIS — I10 Essential (primary) hypertension: Secondary | ICD-10-CM | POA: Diagnosis not present

## 2021-11-26 DIAGNOSIS — H6121 Impacted cerumen, right ear: Secondary | ICD-10-CM | POA: Diagnosis not present

## 2021-11-29 ENCOUNTER — Other Ambulatory Visit: Payer: Self-pay | Admitting: Cardiovascular Disease

## 2021-11-30 NOTE — Telephone Encounter (Signed)
Left message to return a call to me. 

## 2021-12-01 ENCOUNTER — Ambulatory Visit: Payer: Medicare Other | Admitting: *Deleted

## 2021-12-01 DIAGNOSIS — I4891 Unspecified atrial fibrillation: Secondary | ICD-10-CM | POA: Diagnosis not present

## 2021-12-01 DIAGNOSIS — Z5181 Encounter for therapeutic drug level monitoring: Secondary | ICD-10-CM

## 2021-12-01 LAB — POCT INR: INR: 1.8 — AB (ref 2.0–3.0)

## 2021-12-01 NOTE — Patient Instructions (Signed)
Take warfarin 1 tablet tonight then resume 1 tablet daily except 1/2 tablet on Mondays, Wednesdays and Fridays Continue greens Recheck in 4 weeks

## 2021-12-10 ENCOUNTER — Telehealth: Payer: Self-pay | Admitting: *Deleted

## 2021-12-10 NOTE — Telephone Encounter (Signed)
My chart message sent to the patient per Sharee Pimple at the sleep lab , his appointment needs to be changed. ?

## 2021-12-21 ENCOUNTER — Ambulatory Visit: Payer: Medicare Other | Attending: Cardiovascular Disease | Admitting: Cardiovascular Disease

## 2021-12-21 ENCOUNTER — Other Ambulatory Visit: Payer: Self-pay

## 2021-12-21 DIAGNOSIS — G4733 Obstructive sleep apnea (adult) (pediatric): Secondary | ICD-10-CM | POA: Diagnosis not present

## 2021-12-21 DIAGNOSIS — H9202 Otalgia, left ear: Secondary | ICD-10-CM | POA: Diagnosis not present

## 2021-12-21 DIAGNOSIS — I480 Paroxysmal atrial fibrillation: Secondary | ICD-10-CM | POA: Diagnosis not present

## 2021-12-21 DIAGNOSIS — L03317 Cellulitis of buttock: Secondary | ICD-10-CM | POA: Diagnosis not present

## 2021-12-21 DIAGNOSIS — H9201 Otalgia, right ear: Secondary | ICD-10-CM | POA: Diagnosis not present

## 2021-12-30 ENCOUNTER — Ambulatory Visit: Payer: Medicare Other | Admitting: *Deleted

## 2021-12-30 ENCOUNTER — Ambulatory Visit (INDEPENDENT_AMBULATORY_CARE_PROVIDER_SITE_OTHER): Payer: Medicare Other

## 2021-12-30 DIAGNOSIS — I495 Sick sinus syndrome: Secondary | ICD-10-CM | POA: Diagnosis not present

## 2021-12-30 DIAGNOSIS — Z5181 Encounter for therapeutic drug level monitoring: Secondary | ICD-10-CM | POA: Diagnosis not present

## 2021-12-30 DIAGNOSIS — I4891 Unspecified atrial fibrillation: Secondary | ICD-10-CM

## 2021-12-30 LAB — POCT INR: INR: 2.4 (ref 2.0–3.0)

## 2021-12-30 NOTE — Patient Instructions (Addendum)
Description   ?Continue taking 1 tablet daily except 1/2 tablet on Mondays, Wednesdays and Fridays ? ?Recheck in 1 week (Pt should finish doxy on 3/26) ?  ?  ?

## 2021-12-31 LAB — CUP PACEART REMOTE DEVICE CHECK
Battery Remaining Longevity: 157 mo
Battery Voltage: 3.09 V
Brady Statistic AP VP Percent: 8.14 %
Brady Statistic AP VS Percent: 20.05 %
Brady Statistic AS VP Percent: 0.23 %
Brady Statistic AS VS Percent: 71.64 %
Brady Statistic RA Percent Paced: 22.34 %
Brady Statistic RV Percent Paced: 8.94 %
Date Time Interrogation Session: 20230323010300
Implantable Lead Implant Date: 20120820
Implantable Lead Implant Date: 20120820
Implantable Lead Location: 753859
Implantable Lead Location: 753860
Implantable Pulse Generator Implant Date: 20220323
Lead Channel Impedance Value: 342 Ohm
Lead Channel Impedance Value: 342 Ohm
Lead Channel Impedance Value: 380 Ohm
Lead Channel Impedance Value: 437 Ohm
Lead Channel Pacing Threshold Amplitude: 0.625 V
Lead Channel Pacing Threshold Amplitude: 1.75 V
Lead Channel Pacing Threshold Pulse Width: 0.4 ms
Lead Channel Pacing Threshold Pulse Width: 0.4 ms
Lead Channel Sensing Intrinsic Amplitude: 3.875 mV
Lead Channel Sensing Intrinsic Amplitude: 3.875 mV
Lead Channel Sensing Intrinsic Amplitude: 7.25 mV
Lead Channel Sensing Intrinsic Amplitude: 7.25 mV
Lead Channel Setting Pacing Amplitude: 1.5 V
Lead Channel Setting Pacing Amplitude: 4 V
Lead Channel Setting Pacing Pulse Width: 0.4 ms
Lead Channel Setting Sensing Sensitivity: 0.9 mV

## 2022-01-04 ENCOUNTER — Encounter: Payer: Self-pay | Admitting: Cardiovascular Disease

## 2022-01-04 NOTE — Procedures (Signed)
?   ? ? ?                  [place]     ? ? ? ?Patient Name: Alex Maddox, Alex Maddox ?Study Date: 12/21/2021 ?Gender: Male ?D.O.B: [birth year] ?Age (years): 37 ?Referring Provider: Dani Gobble Croitoru ?Height (inches): 72 ?Interpreting Physician: Shelva Majestic MD, ABSM ?Weight (lbs): 198 ?RPSGT: Rosebud Poles ?BMI: 27 ?MRN: [id number] ?Neck Size: 17.00 ? ?CLINICAL INFORMATION ?The patient is referred for a CPAP titration to treat sleep apnea. ? ?Date of NPSG: 10/25/2021:  AHI10.9/h; RDI 11.2/h; REM AHI 22.0/h; Supine AHI 28.3/h. ? ?SLEEP STUDY TECHNIQUE ?As per the AASM Manual for the Scoring of Sleep and Associated Events v2.3 (April 2016) with a hypopnea requiring 4% desaturations. ? ?The channels recorded and monitored were frontal, central and occipital EEG, electrooculogram (EOG), submentalis EMG (chin), nasal and oral airflow, thoracic and abdominal wall motion, anterior tibialis EMG, snore microphone, electrocardiogram, and pulse oximetry. Continuous positive airway pressure (CPAP) was initiated at the beginning of the study and titrated to treat sleep-disordered breathing. ? ?MEDICATIONS ?carvedilol (COREG) 25 MG tablet ?diazepam (VALIUM) 2 MG tablet ?finasteride (PROSCAR) 5 MG tablet ?Flaxseed Oil OIL ?irbesartan (AVAPRO) 300 MG tablet ?Misc Natural Products (NEURIVA) CAPS ?Multiple Vitamin (MULTIVITAMIN WITH MINERALS) TABS tablet ?Omega-3 Fatty Acids (FISH OIL) 1000 MG CAPS ?Polyethyl Glycol-Propyl Glycol 0.4-0.3 % SOLN ?rosuvastatin (CRESTOR) 20 MG tablet ?Saw Palmetto 450 MG CAPS ?trospium (SANCTURA) 20 MG tablet ?warfarin (COUMADIN) 5 MG tablet  ?Medications self-administered by patient taken the night of the study : N/A ? ?TECHNICIAN COMMENTS ?Comments added by technician: CPAP therapy started at 4 CWP and increased to 10 CWP, due to events and snoring episodes. Patient tolerated CPAP very well. Optimal pressure obtained during REM- supine stage. Although, hypopneic-like events noticed but was not associated with  4% desats. EPR of 3, 2, and 1 was attempted, causing increase in apnea and hypopnea events at times. EPR d/c'd after an increase in events. PLMS increased at times. EKG = Tachycardia and PVC's . **Please note the EKG print outs ?Comments added by scorer: N/A ? ?RESPIRATORY PARAMETERS ?Optimal PAP Pressure (cm): 10 AHI at Optimal Pressure (/hr): 0.6 ?Overall Minimal O2 (%): 88.00 Supine % at Optimal Pressure (%): 18 ?Minimal O2 at Optimal Pressure (%): 93.0  ? ?SLEEP ARCHITECTURE ?The study was initiated at 10:28:53 PM and ended at 5:27:20 AM. ? ?Sleep onset time was 15.3 minutes and the sleep efficiency was 92.5%. The total sleep time was 387.2 minutes. ? ?The patient spent 1.29% of the night in stage N1 sleep, 60.74% in stage N2 sleep, 24.28% in stage N3 and 13.7% in REM.Stage REM latency was 142.0 minutes ? ?Wake after sleep onset was 16.0. Alpha intrusion was absent. Supine sleep was 35.75%. ? ?CARDIAC DATA ?The 2 lead EKG demonstrated sinus rhythm, pacemaker generated. The mean heart rate was 66.04 beats per minute. Other EKG findings include: PVCs. ? ?LEG MOVEMENT DATA ?The total Periodic Limb Movements of Sleep (PLMS) were 302. The PLMS index was 46.80. A PLMS index of <15 is considered normal in adults. ? ?IMPRESSIONS ?- CPAP was initiated at 4 cm and was titrated to optimal PAP pressure 10 cm of water. (AHI 0; RDI 0.6/h; O2 nadir 93%). ?- Mild oxygen desaturations were observed during this titration to a nadir of 88% at 7 cm of water. ?- The patient snored with moderate snoring volume during this titration study. Mild snoring was still present at 10 cm. ?- 2-lead EKG demonstrated: Atrial  fibrillation and the start of the study which converted to sinus rhyhm. ?- Moderate periodic limb movements were observed during this study. Arousals associated with PLMs were rare. ? ?DIAGNOSIS ?- Obstructive Sleep Apnea (G47.33) ? ?RECOMMENDATIONS ?- Recommend an initial trial of CPAP Auto therapy at 9 - 15 cm H2O with  heated humidification. A Small-Medium size Fisher[employer]Paykel Full Face Evora Full mask was used for the titration. ?- Effort should be made to optimize nasal and oropharyngeal patency. ?- Avoid alcohol, sedatives and other CNS depressants that may worsen sleep apnea and disrupt normal sleep architecture. ?- Sleep hygiene should be reviewed to assess factors that may improve sleep quality. ?- Weight management and regular exercise should be initiated or continued. ?- Recommend a download and sleep clinic evaluation after 4 weeks of therapy ? ? ?[Electronically signed] 01/04/2022 02:58 PM ? ?Shelva Majestic MD, [facility],  ABSM ?[place], [place] Board of Sleep Medicine ? ? ?NPI: [id number] ? ?[place] ?PH: [phone]   FX: [phone] ?ACCREDITED BY THE AMERICAN ACADEMY OF SLEEP MEDICINE ? ?

## 2022-01-06 ENCOUNTER — Ambulatory Visit: Payer: Medicare Other | Admitting: *Deleted

## 2022-01-06 DIAGNOSIS — I4891 Unspecified atrial fibrillation: Secondary | ICD-10-CM | POA: Diagnosis not present

## 2022-01-06 DIAGNOSIS — Z5181 Encounter for therapeutic drug level monitoring: Secondary | ICD-10-CM | POA: Diagnosis not present

## 2022-01-06 LAB — POCT INR: INR: 2.8 (ref 2.0–3.0)

## 2022-01-06 NOTE — Patient Instructions (Signed)
Continue taking 1 tablet daily except 1/2 tablet on Mondays, Wednesdays and Fridays ?Recheck in 6 weeks ?

## 2022-01-07 NOTE — Progress Notes (Signed)
Remote pacemaker transmission.   

## 2022-01-11 ENCOUNTER — Telehealth: Payer: Self-pay | Admitting: *Deleted

## 2022-01-11 NOTE — Telephone Encounter (Signed)
Message left CPAP titration has been completed. CPAP machine will be ordered. Order has been sent to [place] since the patient lives in [place]. ?

## 2022-01-13 DIAGNOSIS — G4733 Obstructive sleep apnea (adult) (pediatric): Secondary | ICD-10-CM | POA: Diagnosis not present

## 2022-01-19 ENCOUNTER — Telehealth: Payer: Self-pay | Admitting: Cardiovascular Disease

## 2022-01-19 NOTE — Telephone Encounter (Signed)
What problem are you experiencing? Having issues breathing while using CPAP ? ?Who is your medical equipment company? [employer] ? ? ?Please route to the sleep study assistant.  ?

## 2022-01-21 ENCOUNTER — Ambulatory Visit (INDEPENDENT_AMBULATORY_CARE_PROVIDER_SITE_OTHER): Payer: Medicare Other | Admitting: Cardiovascular Disease

## 2022-01-21 ENCOUNTER — Encounter: Payer: Self-pay | Admitting: Cardiovascular Disease

## 2022-01-21 VITALS — BP 113/85 | HR 147 | Ht 72.0 in | Wt 213.8 lb

## 2022-01-21 DIAGNOSIS — R55 Syncope and collapse: Secondary | ICD-10-CM | POA: Diagnosis not present

## 2022-01-21 DIAGNOSIS — I5041 Acute combined systolic (congestive) and diastolic (congestive) heart failure: Secondary | ICD-10-CM | POA: Diagnosis not present

## 2022-01-21 DIAGNOSIS — I480 Paroxysmal atrial fibrillation: Secondary | ICD-10-CM | POA: Diagnosis not present

## 2022-01-21 DIAGNOSIS — G4733 Obstructive sleep apnea (adult) (pediatric): Secondary | ICD-10-CM

## 2022-01-21 DIAGNOSIS — Z950 Presence of cardiac pacemaker: Secondary | ICD-10-CM | POA: Diagnosis not present

## 2022-01-21 DIAGNOSIS — I7143 Infrarenal abdominal aortic aneurysm, without rupture: Secondary | ICD-10-CM | POA: Diagnosis not present

## 2022-01-21 DIAGNOSIS — I483 Typical atrial flutter: Secondary | ICD-10-CM

## 2022-01-21 DIAGNOSIS — I10 Essential (primary) hypertension: Secondary | ICD-10-CM

## 2022-01-21 DIAGNOSIS — I495 Sick sinus syndrome: Secondary | ICD-10-CM

## 2022-01-21 DIAGNOSIS — E785 Hyperlipidemia, unspecified: Secondary | ICD-10-CM | POA: Diagnosis not present

## 2022-01-21 DIAGNOSIS — D6869 Other thrombophilia: Secondary | ICD-10-CM

## 2022-01-21 DIAGNOSIS — R140 Abdominal distension (gaseous): Secondary | ICD-10-CM | POA: Diagnosis not present

## 2022-01-21 DIAGNOSIS — I2510 Atherosclerotic heart disease of native coronary artery without angina pectoris: Secondary | ICD-10-CM

## 2022-01-21 DIAGNOSIS — I7121 Aneurysm of the ascending aorta, without rupture: Secondary | ICD-10-CM

## 2022-01-21 DIAGNOSIS — R1900 Intra-abdominal and pelvic swelling, mass and lump, unspecified site: Secondary | ICD-10-CM | POA: Diagnosis not present

## 2022-01-21 MED ORDER — FUROSEMIDE 40 MG PO TABS: ORAL_TABLET | ORAL | 0 refills | Status: DC

## 2022-01-21 MED ORDER — POTASSIUM CHLORIDE ER 20 MEQ PO TBCR: EXTENDED_RELEASE_TABLET | ORAL | 0 refills | Status: DC

## 2022-01-21 NOTE — Patient Instructions (Addendum)
Medication Instructions:  ?FUROSEMIDE: Take one 40 mg tablet daily until your weight reaches 200 lbs or less, then stop.  ? ?POTASSIUM: Take one 20 mEq tablet daily with your furosemide until your weight reaches 200 lbs or less, then stop.  ? ?*If you need a refill on your cardiac medications before your next appointment, please call your pharmacy* ? ? ?Lab Work: ?None ordered ? ?If you have labs (blood work) drawn today and your tests are completely normal, you will receive your results only by: ?MyChart Message (if you have MyChart) OR ?A paper copy in the mail ?If you have any lab test that is abnormal or we need to change your treatment, we will call you to review the results. ? ? ?Testing/Procedures: ?None ordered ? ? ?Follow-Up: ?At [facility], you and your health needs are our priority.  As part of our continuing mission to provide you with exceptional heart care, we have created designated Provider Care Teams.  These Care Teams include your primary Cardiologist (physician) and Advanced Practice Providers (APPs -  Physician Assistants and Nurse Practitioners) who all work together to provide you with the care you need, when you need it. ? ?We recommend signing up for the patient portal called "MyChart".  Sign up information is provided on this After Visit Summary.  MyChart is used to connect with patients for Virtual Visits (Telemedicine).  Patients are able to view lab/test results, encounter notes, upcoming appointments, etc.  Non-urgent messages can be sent to your provider as well.   ?To learn more about what you can do with MyChart, go to [email or web address].   ? ?Your next appointment:   ?Keep follow up as scheduled ? ? ?

## 2022-01-23 ENCOUNTER — Encounter: Payer: Self-pay | Admitting: Cardiovascular Disease

## 2022-01-23 DIAGNOSIS — I483 Typical atrial flutter: Secondary | ICD-10-CM | POA: Insufficient documentation

## 2022-01-23 DIAGNOSIS — G4733 Obstructive sleep apnea (adult) (pediatric): Secondary | ICD-10-CM | POA: Insufficient documentation

## 2022-01-23 DIAGNOSIS — I5041 Acute combined systolic (congestive) and diastolic (congestive) heart failure: Secondary | ICD-10-CM | POA: Insufficient documentation

## 2022-01-23 NOTE — Progress Notes (Signed)
Patient ID: Alex Maddox, male   DOB: [birth year], 71 y.o.   MRN: [id number] ?  ? ?Cardiology Office Note   ? ?Date:  01/23/2022  ? ?ID:  Alex Maddox, DOB [birth year], MRN [id number] ? ?PCP:  Redmond School, MD  ?Cardiologist:   Sanda Klein, MD  ? ?Chief Complaint  ?Patient presents with  ? Leg Swelling  ? ? ?History of Present Illness:  ?Alex Maddox is a 71 y.o. male with frequent paroxysmal atrial fibrillation, decreased LVEF without previous evidence of clinical heart failure, thoracic and abdominal aortic aneurysm, neurally mediated syncope, tachycardia-bradycardia syndrome and pacemaker (Medtronic Azure with 5086 leads). ? ?He presents with approximately a week of fatigue, weight gain, leg edema and breathlessness.  Usually quite active, he has had to slow down; sounds like he is describing NYHA functional class II exertional dyspnea.  He weighs about 15 pounds more than usual and again has occurred rapidly.  He is not aware of palpitations, but when he checks his blood pressure the heart rate has been around 150.  I think he is experiencing some mild orthopnea based on his report as well. ? ?He has not had any episodes of dizziness or syncope.  He does not have claudication or focal neurological complaints.  He denies chest pain at rest or with activity. ? ?Since the adjustments we made in his pacemaker settings we made the sudden bradycardia response more aggressive) he has no longer had any episodes of vasovagal syncope and denies any episodes of presyncope.  He is physically active and denies exertional dyspnea or angina.  He does not have orthopnea, PND, edema, claudication and has not had any new focal neurological deficits. ? ?He had an abnormal sleep study and underwent CPAP titration last month.  He has tried to wear the CPAP mask, so far without much success. ? ?His blood pressure is better after we started him on amlodipine. ? ?His EKG shows typical counterclockwise right atrial flutter with 2: 1  AV block and a ventricular rate of 150 bpm.  He has a right bundle branch block.  He does not have any ischemic ECG changes. ? ?His current pacemaker was a generator change performed in 2022 and current expected generator longevity is over 13 years.  Lead parameters are normal.  Pacemaker interrogation shows that he has been in this atrial flutter rhythm with high ventricular rates for about a week.  Pacemaker function is otherwise normal.  He has about 20% atrial pacing, 27% burden of atrial fibrillation (up from his usual 15-17%)and only 8% ventricular pacing.  Heart rate histograms show a high burden of RVR. ? ?Attempted overdrive atrial pacing in the clinic today.  Although there was clear entrainment of the atrial mechanism the atrial flutter persisted.  Gradually decreasing cycle lengths were used.  After burst pacing at 180 ms the rhythm converted to atrial fibrillation and the ventricular rate dropped from 150 to the 80s. ? ?He has a history of neurally mediated syncope with both cardioinhibitory and vasodepressor components and received a dual-chamber Medtronic MRI conditional permanent pacemaker in August 2012. He has a history of paroxysmal atrial fibrillation and a history of TIA at age 16. He had a traumatic right occipital intracerebral hemorrhage while on warfarin anticoagulation in November 2016. He also has thoracic ascending aortic aneurysm, infrarenal AAA, systemic hypertension and hyperlipidemia. Normal nuclear stress test perfusion pattern in 2012, EF 43%; similar EF by echocardiography. Echo images are very difficult and follow-up echo in 2015  could not quantify EF. He has never had clinical heart failure. He takes a statin for hyperlipidemia as well as valsartan and carvedilol for both the arrhythmia, the HTN and the cardiomyopathy. He has ascending aortic enlargement (4.2 cm) and fusiform ectasia of the infrarenal abdominal aorta (max diam 3.0 cm) and CT evidence of coronary  calcification. ? ?Past Medical History:  ?Diagnosis Date  ? Abnormal echocardiogram   ? stress myoview 10/29/2010  ? Arthritis   ? " IN MY BACK "  ? Dyslipidemia   ? Dysrhythmia   ? Atrial fibrillation  ? Hypertension   ? Paroxysmal atrial fibrillation (HCC)   ? Presence of permanent cardiac pacemaker   ? Sinus node dysfunction (HCC)   ? Sleep apnea, obstructive   ? sleep study 10/09/2007  AHI 5.73/hr  REM AHI 11.90/hr   ? 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 LEFT THUMB   ? Stroke [facility])   ? Syncope   ? 2D Echocardiogram 09/27/2010 EF between 40 to 45%  ? Tachycardia-bradycardia [facility])   ? Transient ischemic attack (TIA) 06/07  ? ? ?Past Surgical History:  ?Procedure Laterality Date  ? COLONOSCOPY    ? COLONOSCOPY N/A 06/07/2017  ? Procedure: COLONOSCOPY;  Surgeon: Rogene Houston, MD;  [facility];  Service: Endoscopy;  Laterality: N/A;  9:30  ? COLONOSCOPY N/A 09/20/2018  ? Procedure: COLONOSCOPY;  Surgeon: Rogene Houston, MD;  [facility];  Service: Endoscopy;  Laterality: N/A;  1030  ? ELECTROCARDIOGRAM    ? INSERT / REPLACE / REMOVE PACEMAKER  05/2011  ? LOOP RECORDER IMPLANT  02/02/2011  ? Medtronic- CRM   ? PACEMAKER INSERTION  05/30/2011  ? Medtronic Revo   last checked 11/07/2012  ? POLYPECTOMY  09/20/2018  ? Procedure: POLYPECTOMY;  Surgeon: Rogene Houston, MD;  [facility];  Service: Endoscopy;;  colon  ? PPM GENERATOR CHANGEOUT N/A 12/30/2020  ? Procedure: PPM GENERATOR CHANGEOUT;  Surgeon: Sanda Klein, MD;  Location: [place] CV LAB;  Service: Cardiovascular;  Laterality: N/A;  ? ? ?Outpatient Medications Prior to Visit  ?Medication Sig Dispense Refill  ? carvedilol (COREG) 25 MG tablet Take 2 tablets (50 mg total) by mouth 2 (two) times daily with a meal. 120 tablet 5  ? diazepam (VALIUM) 2 MG tablet Take 2 mg by mouth every 8 (eight) hours as needed for anxiety.     ? finasteride (PROSCAR) 5 MG tablet Take 5 mg by mouth daily.    ? Flaxseed Oil OIL  Take 1,000 mg by mouth daily.    ? irbesartan (AVAPRO) 300 MG tablet Take 1 tablet (300 mg total) by mouth daily. 90 tablet 3  ? Misc Natural Products (NEURIVA) CAPS Take 1 capsule by mouth daily. For memory    ? Multiple Vitamin (MULTIVITAMIN WITH MINERALS) TABS tablet Take 1 tablet by mouth daily. Adult Multivitamin 50+    ? Omega-3 Fatty Acids (FISH OIL) 1000 MG CAPS Take 1,000 mg by mouth daily.    ? Polyethyl Glycol-Propyl Glycol 0.4-0.3 % SOLN Place 1 drop into both eyes 3 (three) times daily as needed (for dry eyes.).    ? rosuvastatin (CRESTOR) 20 MG tablet TAKE ONE TABLET ('20MG'$  TOTAL) BY MOUTH DAILY 90 tablet 3  ? Saw Palmetto 450 MG CAPS Take 900 mg by mouth daily.    ? trospium (SANCTURA) 20 MG tablet Take 20 mg by mouth daily.    ? warfarin (COUMADIN) 5 MG  tablet TAKE ONE TABLET BY MOUTH EVERY DAY AS DIRECTED BY COUMADIN CLINIC 90 tablet 1  ? ?No facility-administered medications prior to visit.  ?  ? ?Allergies:   Patient has no known allergies.  ? ?Social History  ? ?Socioeconomic History  ? Marital status: Married  ?  Spouse name: Not on file  ? Number of children: 0  ? Years of education: Not on file  ? Highest education level: Not on file  ?Occupational History  ? Not on file  ?Tobacco Use  ? Smoking status: Never  ? Smokeless tobacco: Never  ?Vaping Use  ? Vaping Use: Never used  ?Substance and Sexual Activity  ? Alcohol use: Yes  ?  Comment: occasionally wine  ? Drug use: No  ? Sexual activity: Not on file  ?Other Topics Concern  ? Not on file  ?Social History Narrative  ? 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  ? Right Handed   ? Lives in a two story home  ? ?Social Determinants of Health  ? ?Financial Resource Strain: Not on file  ?Food Insecurity: Not on file  ?Transportation Needs: Not on file  ?Physical Activity: Not on file  ?Stress: Not on file  ?Social Connections: Not on file  ?  ? ?Family History:  The patient's family history includes Heart attack (age of onset: 2) in his  father; Hypertension in his brother and mother.  ? ?ROS:   ?Please see the history of present illness.    ?ROS ?All other systems are reviewed and are negative. ? ? ?PHYSICAL EXAM:   ?VS:  BP 113/85 (BP Location: Left Arm)

## 2022-01-24 ENCOUNTER — Ambulatory Visit (HOSPITAL_BASED_OUTPATIENT_CLINIC_OR_DEPARTMENT_OTHER)
Admission: RE | Admit: 2022-01-24 | Discharge: 2022-01-24 | Disposition: A | Discharge status: Home / Self Care | Payer: Medicare Other | Source: Ambulatory Visit | Attending: Physician Assistant | Admitting: Physician Assistant

## 2022-01-24 ENCOUNTER — Inpatient Hospital Stay (HOSPITAL_COMMUNITY)
Admission: RE | Admit: 2022-01-24 | Discharge: 2022-01-27 | DRG: 308 | Disposition: A | Discharge status: Home / Self Care | Payer: Medicare Other | Source: Ambulatory Visit | Attending: Cardiovascular Disease | Admitting: Cardiovascular Disease

## 2022-01-24 ENCOUNTER — Other Ambulatory Visit: Payer: Self-pay

## 2022-01-24 ENCOUNTER — Encounter (INDEPENDENT_AMBULATORY_CARE_PROVIDER_SITE_OTHER): Payer: Self-pay | Admitting: *Deleted

## 2022-01-24 ENCOUNTER — Telehealth: Payer: Self-pay | Admitting: Cardiovascular Disease

## 2022-01-24 VITALS — Ht 72.0 in | Wt 209.6 lb

## 2022-01-24 DIAGNOSIS — I480 Paroxysmal atrial fibrillation: Secondary | ICD-10-CM

## 2022-01-24 DIAGNOSIS — Z8673 Personal history of transient ischemic attack (TIA), and cerebral infarction without residual deficits: Secondary | ICD-10-CM

## 2022-01-24 DIAGNOSIS — I4510 Unspecified right bundle-branch block: Secondary | ICD-10-CM | POA: Diagnosis not present

## 2022-01-24 DIAGNOSIS — Z7901 Long term (current) use of anticoagulants: Secondary | ICD-10-CM

## 2022-01-24 DIAGNOSIS — I4892 Unspecified atrial flutter: Secondary | ICD-10-CM | POA: Diagnosis not present

## 2022-01-24 DIAGNOSIS — I429 Cardiomyopathy, unspecified: Secondary | ICD-10-CM | POA: Diagnosis present

## 2022-01-24 DIAGNOSIS — I7121 Aneurysm of the ascending aorta, without rupture: Secondary | ICD-10-CM | POA: Diagnosis present

## 2022-01-24 DIAGNOSIS — I5043 Acute on chronic combined systolic (congestive) and diastolic (congestive) heart failure: Secondary | ICD-10-CM | POA: Diagnosis not present

## 2022-01-24 DIAGNOSIS — Z87898 Personal history of other specified conditions: Secondary | ICD-10-CM | POA: Diagnosis not present

## 2022-01-24 DIAGNOSIS — I5033 Acute on chronic diastolic (congestive) heart failure: Secondary | ICD-10-CM | POA: Diagnosis present

## 2022-01-24 DIAGNOSIS — I7143 Infrarenal abdominal aortic aneurysm, without rupture: Secondary | ICD-10-CM | POA: Diagnosis not present

## 2022-01-24 DIAGNOSIS — I441 Atrioventricular block, second degree: Secondary | ICD-10-CM | POA: Diagnosis present

## 2022-01-24 DIAGNOSIS — Z8249 Family history of ischemic heart disease and other diseases of the circulatory system: Secondary | ICD-10-CM

## 2022-01-24 DIAGNOSIS — Z79899 Other long term (current) drug therapy: Secondary | ICD-10-CM

## 2022-01-24 DIAGNOSIS — Z950 Presence of cardiac pacemaker: Secondary | ICD-10-CM

## 2022-01-24 DIAGNOSIS — I2510 Atherosclerotic heart disease of native coronary artery without angina pectoris: Secondary | ICD-10-CM | POA: Diagnosis not present

## 2022-01-24 DIAGNOSIS — I110 Hypertensive heart disease with heart failure: Secondary | ICD-10-CM | POA: Diagnosis not present

## 2022-01-24 DIAGNOSIS — G4733 Obstructive sleep apnea (adult) (pediatric): Secondary | ICD-10-CM | POA: Diagnosis present

## 2022-01-24 DIAGNOSIS — D6869 Other thrombophilia: Secondary | ICD-10-CM | POA: Insufficient documentation

## 2022-01-24 DIAGNOSIS — I483 Typical atrial flutter: Secondary | ICD-10-CM | POA: Diagnosis not present

## 2022-01-24 DIAGNOSIS — E785 Hyperlipidemia, unspecified: Secondary | ICD-10-CM | POA: Diagnosis not present

## 2022-01-24 DIAGNOSIS — Z85828 Personal history of other malignant neoplasm of skin: Secondary | ICD-10-CM

## 2022-01-24 DIAGNOSIS — I4819 Other persistent atrial fibrillation: Secondary | ICD-10-CM | POA: Diagnosis not present

## 2022-01-24 DIAGNOSIS — I495 Sick sinus syndrome: Secondary | ICD-10-CM | POA: Diagnosis present

## 2022-01-24 LAB — BASIC METABOLIC PANEL
Anion gap: 7 (ref 5–15)
BUN: 22 mg/dL (ref 8–23)
CO2: 27 mmol/L (ref 22–32)
Calcium: 9.6 mg/dL (ref 8.9–10.3)
Chloride: 104 mmol/L (ref 98–111)
Creatinine, Ser: 1.54 mg/dL — ABNORMAL HIGH (ref 0.61–1.24)
GFR, Estimated: 49 mL/min — ABNORMAL LOW (ref >60)
Glucose, Bld: 92 mg/dL (ref 70–99)
Potassium: 3.9 mmol/L (ref 3.5–5.1)
Sodium: 138 mmol/L (ref 135–145)

## 2022-01-24 LAB — PROTIME-INR
INR: 3.1 — ABNORMAL HIGH (ref 0.8–1.2)
Prothrombin Time: 31.8 seconds — ABNORMAL HIGH (ref 11.4–15.2)

## 2022-01-24 LAB — MAGNESIUM: Magnesium: 2.2 mg/dL (ref 1.7–2.4)

## 2022-01-24 MED ORDER — FUROSEMIDE 10 MG/ML IJ SOLN
40.0000 mg | Freq: Once | INTRAMUSCULAR | Status: AC
  Administered 2022-01-24: 40 mg via INTRAVENOUS
  Filled 2022-01-24: qty 4

## 2022-01-24 MED ORDER — POTASSIUM CHLORIDE ER 20 MEQ PO TBCR: 20.0000 meq | EXTENDED_RELEASE_TABLET | Freq: Every day | ORAL | Status: DC

## 2022-01-24 MED ORDER — WARFARIN SODIUM 2.5 MG PO TABS
2.5000 mg | ORAL_TABLET | Freq: Once | ORAL | Status: AC
  Administered 2022-01-24: 2.5 mg via ORAL
  Filled 2022-01-24: qty 1

## 2022-01-24 MED ORDER — FLAXSEED OIL OIL: 1000.0000 mg | TOPICAL_OIL | Freq: Every day | Status: DC

## 2022-01-24 MED ORDER — SODIUM CHLORIDE 0.9% FLUSH
3.0000 mL | Freq: Two times a day (BID) | INTRAVENOUS | Status: DC
  Administered 2022-01-24 – 2022-01-26 (×3): 3 mL via INTRAVENOUS

## 2022-01-24 MED ORDER — ROSUVASTATIN CALCIUM 20 MG PO TABS
20.0000 mg | ORAL_TABLET | Freq: Every day | ORAL | Status: DC
  Administered 2022-01-24 – 2022-01-26 (×3): 20 mg via ORAL
  Filled 2022-01-24 (×3): qty 1

## 2022-01-24 MED ORDER — POTASSIUM CHLORIDE CRYS ER 20 MEQ PO TBCR
40.0000 meq | EXTENDED_RELEASE_TABLET | Freq: Once | ORAL | Status: AC
  Administered 2022-01-24: 40 meq via ORAL
  Filled 2022-01-24: qty 2

## 2022-01-24 MED ORDER — IRBESARTAN 150 MG PO TABS
300.0000 mg | ORAL_TABLET | Freq: Every day | ORAL | Status: DC
  Administered 2022-01-25 – 2022-01-27 (×3): 300 mg via ORAL
  Filled 2022-01-24 (×3): qty 2

## 2022-01-24 MED ORDER — DARIFENACIN HYDROBROMIDE ER 7.5 MG PO TB24
7.5000 mg | ORAL_TABLET | Freq: Every day | ORAL | Status: DC
Start: 2022-01-24 — End: 2022-01-27
  Administered 2022-01-24 – 2022-01-27 (×4): 7.5 mg via ORAL
  Filled 2022-01-24 (×4): qty 1

## 2022-01-24 MED ORDER — POTASSIUM CHLORIDE CRYS ER 20 MEQ PO TBCR
20.0000 meq | EXTENDED_RELEASE_TABLET | Freq: Every day | ORAL | Status: DC
  Administered 2022-01-25 – 2022-01-27 (×3): 20 meq via ORAL
  Filled 2022-01-24 (×3): qty 1

## 2022-01-24 MED ORDER — DOFETILIDE 500 MCG PO CAPS
500.0000 ug | ORAL_CAPSULE | Freq: Two times a day (BID) | ORAL | Status: DC
  Administered 2022-01-24: 500 ug via ORAL
  Filled 2022-01-24: qty 1

## 2022-01-24 MED ORDER — SODIUM CHLORIDE 0.9 % IV SOLN: 250.0000 mL | INTRAVENOUS | Status: DC | PRN

## 2022-01-24 MED ORDER — WARFARIN - PHARMACIST DOSING INPATIENT: Freq: Every day | Status: DC

## 2022-01-24 MED ORDER — CARVEDILOL 25 MG PO TABS
50.0000 mg | ORAL_TABLET | Freq: Two times a day (BID) | ORAL | Status: DC
  Administered 2022-01-25 – 2022-01-27 (×5): 50 mg via ORAL
  Filled 2022-01-24 (×5): qty 2

## 2022-01-24 MED ORDER — FINASTERIDE 5 MG PO TABS
5.0000 mg | ORAL_TABLET | Freq: Every day | ORAL | Status: DC
  Administered 2022-01-25 – 2022-01-27 (×3): 5 mg via ORAL
  Filled 2022-01-24 (×3): qty 1

## 2022-01-24 MED ORDER — METOPROLOL TARTRATE 5 MG/5ML IV SOLN
5.0000 mg | Freq: Once | INTRAVENOUS | Status: AC
  Administered 2022-01-24: 5 mg via INTRAVENOUS
  Filled 2022-01-24: qty 5

## 2022-01-24 MED ORDER — SODIUM CHLORIDE 0.9% FLUSH: 3.0000 mL | INTRAVENOUS | Status: DC | PRN

## 2022-01-24 MED ORDER — FUROSEMIDE 40 MG PO TABS
40.0000 mg | ORAL_TABLET | Freq: Every day | ORAL | Status: DC
  Administered 2022-01-25 – 2022-01-27 (×3): 40 mg via ORAL
  Filled 2022-01-24 (×3): qty 1

## 2022-01-24 NOTE — Progress Notes (Signed)
Pharmacy: Dofetilide (Tikosyn) - Initial Consult ?Assessment and Electrolyte Replacement ? ?Pharmacy consulted to assist in monitoring and replacing electrolytes in this 71 y.o. male admitted on 01/24/2022 undergoing dofetilide initiation. First dofetilide dose: '500mg'$  BID ? ?Assessment: ? ?Patient Exclusion Criteria: If any screening criteria checked as "Yes", then  patient  should NOT receive dofetilide until criteria item is corrected.  ?If ?Yes? please indicate correction plan. ? ?YES  NO Patient  Exclusion Criteria Correction Plan  ? ?'[x]'$   ?'[]'$   ?Baseline QTc interval is greater than or equal to 440 msec. ?IF above YES box checked dofetilide contraindicated unless patient has ICD; then may proceed if QTc 500-550 msec or with known ventricular conduction abnormalities may proceed with QTc 550-600 msec. ?QTc =  473 Very difficult to determine qtc accurately due to aflutter per Dr. Sallyanne Kuster. He has a PPM  ? ?'[]'$   ?'[x]'$   ?Patient is known or suspected to have a digoxin level greater than 2 ng/ml: ?No results found for: DIGOXIN ?   ? ?'[]'$   ?'[x]'$   ?Creatinine clearance less than 20 ml/min (calculated using Cockcroft-Gault, actual body weight and serum creatinine): ?Estimated Creatinine Clearance: 54.2 mL/min (A) (by C-G formula based on SCr of 1.54 mg/dL (H)). ?   ? ?'[]'$   ?'[x]'$  Patient has received drugs known to prolong the QT intervals within the last 48 hours (phenothiazines, tricyclics or tetracyclic antidepressants, erythromycin, H-1 antihistamines, cisapride, fluoroquinolones, azithromycin). Drugs not listed above may have an, as yet, undetected potential to prolong the QT interval, updated information on QT prolonging agents is available at this website:QT prolonging agents or www.crediblemeds.org   ? ?'[]'$   ?'[x]'$   ?Patient received a dose of hydrochlorothiazide (Oretic) alone or in any combination including triamterene (Dyazide, Maxzide) in the last 48 hours.   ? ?'[]'$   ?'[x]'$  Patient received a medication known to increase  dofetilide plasma concentrations prior to initial dofetilide dose:  ?Trimethoprim (Primsol, Proloprim) in the last 36 hours ?Verapamil (Calan, Verelan) in the last 36 hours or a sustained release dose in the last 72 hours ?Megestrol (Megace) in the last 5 days  ?Cimetidine (Tagamet) in the last 6 hours ?Ketoconazole (Nizoral) in the last 24 hours ?Itraconazole (Sporanox) in the last 48 hours  ?Prochlorperazine (Compazine) in the last 36 hours ?   ? ?'[]'$   ?'[x]'$   ?Patient is known to have a history of torsades de pointes; congenital or acquired long QT syndromes.   ? ?'[]'$   ?'[x]'$   ?Patient has received a Class 1 antiarrhythmic with less than 2 half-lives since last dose. ?(Disopyramide, Quinidine, Procainamide, Lidocaine, Mexiletine, Flecainide, Propafenone)   ? ?'[]'$   ?'[x]'$   ?Patient has received amiodarone therapy in the past 3 months or amiodarone level is greater than 0.3 ng/ml.   ? ?Patient has been appropriately anticoagulated with warfarin. ? ?Labs: ?   ?Component Value Date/Time  ? K 3.9 01/24/2022 1427  ? MG 2.2 01/24/2022 1427  ?  ? ?Plan: ?Potassium: ?K 3.8-3.9:  Hold Tikosyn initiation and give KCl 40 mEq po x1 then begin Tikosyn at least 2hr after KCl dose - do not need to recheck K  ? ?Magnesium: ?Mg >2: Appropriate to initiate Tikosyn, no replacement needed   ? ? ?Onnie Boer, PharmD, BCIDP, AAHIVP, CPP ?Infectious Disease Pharmacist ?01/24/2022 6:45 PM ? ? ? ?

## 2022-01-24 NOTE — Progress Notes (Signed)
Cardiac monitor afib/aflutter 143. Given IV lopressor as ordered. No change noted. BP stable  ?Dr. Sallyanne Kuster into see patient to obtain 12 lead ECG. Patients HR decreased and noted to be in afib. 12 lead obtained. Patient resting quietly. Report to [employer] RN ?

## 2022-01-24 NOTE — Telephone Encounter (Signed)
?  Per MyChart scheduling message this morning: ? ?still complains of trouble breathing, some deep coughing. Sunday night blood pressure 141/92 heart rate 144. Sunday morn 141/114, heart rate 147. Our scales shows down to 210 lbs. still going to bathroom, concerned about high heart rate. please advise if this should lower when more weight drops. Mon morn BP 111/97 rate 142 ?

## 2022-01-24 NOTE — H&P (Signed)
?   ?Chief Complaint  ?Patient presents with  ? Leg Swelling, dyspnea, atrial flutter  ?  ?  ?History of Present Illness:  ?Alex Maddox is a 71 y.o. male with frequent paroxysmal atrial fibrillation, decreased LVEF without previous evidence of clinical heart failure, thoracic and abdominal aortic aneurysm, neurally mediated syncope, tachycardia-bradycardia syndrome and pacemaker (Medtronic Azure with 5086 leads). ?  ?He presented to clinic on Friday, April 14 with approximately a week of fatigue, weight gain, leg edema and breathlessness.  Usually quite active, he has had to slow down; sounds like he is describing NYHA functional class II exertional dyspnea.  He weighs about 15 pounds more than usual and again has occurred rapidly.  He is not aware of palpitations, but when he checks his blood pressure the heart rate has been around 150.  I think he is experiencing some mild orthopnea based on his report as well. ?  ?He has not had any episodes of dizziness or syncope.  He does not have claudication or focal neurological complaints.  He denies chest pain at rest or with activity. ?  ?Since the adjustments we made in his pacemaker settings we made the sudden bradycardia response more aggressive) he has no longer had any episodes of vasovagal syncope and denies any episodes of presyncope.  He is physically active and denies exertional dyspnea or angina.  He does not have orthopnea, PND, edema, claudication and has not had any new focal neurological deficits. ?  ?He had an abnormal sleep study and underwent CPAP titration last month.  He has tried to wear the CPAP mask, so far without much success. ?  ?His blood pressure is better after we started him on amlodipine. ?  ?His EKG shows typical counterclockwise right atrial flutter with 2: 1 AV block and a ventricular rate of 150 bpm.  He has a right bundle branch block.  He does not have any ischemic ECG changes. ?  ?His current pacemaker was a generator change  performed in 2022 and current expected generator longevity is over 13 years.  Lead parameters are normal.  Pacemaker interrogation shows that he has been in this atrial flutter rhythm with high ventricular rates for about a week.  Pacemaker function is otherwise normal.  He has about 20% atrial pacing, 27% burden of atrial fibrillation (up from his usual 15-17%)and only 8% ventricular pacing.  Heart rate histograms show a high burden of RVR. ?  ?Attempted overdrive atrial pacing in the clinic on Friday, April 14.  Although there was clear entrainment of the atrial mechanism the atrial flutter persisted.  Gradually decreasing cycle lengths were used.  After burst pacing at 180 ms the rhythm converted to atrial fibrillation and the ventricular rate dropped from 150 to the 80s.  He felt better on Friday night and early on Saturday the 15th, but later that afternoon and throughout Sunday he felt poorly again.  His heart rate returned to about 150 bpm and when he was seen in the atrial fibrillation clinic today at 2 PM was in atrial flutter with 2: 1 AV block.  Again we made an attempt to overdrive pace unsuccessfully, with conversion to atrial fibrillation with controlled ventricular response in the 80s. ? ?After prescribing diuretics his abdominal bloating improved and his weight has decreased by about 5 to 6 pounds to 209 pounds today, back towards his baseline of about 198 pounds. ? ? ?He has a history of neurally m, April 15,ediated syncope with both cardioinhibitory  and vasodepressor components and received a dual-chamber Medtronic MRI conditional permanent pacemaker in August 2012. He has a history of paroxysmal atrial fibrillation and a history of TIA at age 21. He had a traumatic right occipital intracerebral hemorrhage while on warfarin anticoagulation in November 2016. He also has thoracic ascending aortic aneurysm, infrarenal AAA, systemic hypertension and hyperlipidemia. Normal nuclear stress test perfusion  pattern in 2012, EF 43%; similar EF by echocardiography. Echo images are very difficult and follow-up echo in 2015 could not quantify EF. He has never had clinical heart failure. He takes a statin for hyperlipidemia as well as valsartan and carvedilol for both the arrhythmia, the HTN and the cardiomyopathy. He has ascending aortic enlargement (4.2 cm) and fusiform ectasia of the infrarenal abdominal aorta (max diam 3.0 cm) and CT evidence of coronary calcification. ?  ?    ?Past Medical History:  ?Diagnosis Date  ? Abnormal echocardiogram    ?  stress myoview 10/29/2010  ? Arthritis    ?  " IN MY BACK "  ? Dyslipidemia    ? Dysrhythmia    ?  Atrial fibrillation  ? Hypertension    ? Paroxysmal atrial fibrillation (HCC)    ? Presence of permanent cardiac pacemaker    ? Sinus node dysfunction (HCC)    ? Sleep apnea, obstructive    ?  sleep study 10/09/2007  AHI 5.73/hr  REM AHI 11.90/hr   ? 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  LEFT THUMB   ? Stroke [facility])    ? Syncope    ?  2D Echocardiogram 09/27/2010 EF between 40 to 45%  ? Tachycardia-bradycardia [facility])    ? Transient ischemic attack (TIA) 06/07  ?  ?  ?     ?Past Surgical History:  ?Procedure Laterality Date  ? COLONOSCOPY      ? COLONOSCOPY N/A 06/07/2017  ?  Procedure: COLONOSCOPY;  Surgeon: Rogene Houston, MD;  [facility];  Service: Endoscopy;  Laterality: N/A;  9:30  ? COLONOSCOPY N/A 09/20/2018  ?  Procedure: COLONOSCOPY;  Surgeon: Rogene Houston, MD;  [facility];  Service: Endoscopy;  Laterality: N/A;  1030  ? ELECTROCARDIOGRAM      ? INSERT / REPLACE / REMOVE PACEMAKER   05/2011  ? LOOP RECORDER IMPLANT   02/02/2011  ?  Medtronic- CRM   ? PACEMAKER INSERTION   05/30/2011  ?  Medtronic Revo   last checked 11/07/2012  ? POLYPECTOMY   09/20/2018  ?  Procedure: POLYPECTOMY;  Surgeon: Rogene Houston, MD;  [facility];  Service: Endoscopy;;  colon  ? PPM GENERATOR CHANGEOUT N/A 12/30/2020  ?  Procedure: PPM  GENERATOR CHANGEOUT;  Surgeon: Sanda Klein, MD;  Location: [place] [facility] LAB;  Service: Cardiovascular;  Laterality: N/A;  ?  ?  ?      ?Outpatient Medications Prior to Visit  ?Medication Sig Dispense Refill  ? carvedilol (COREG) 25 MG tablet Take 2 tablets (50 mg total) by mouth 2 (two) times daily with a meal. 120 tablet 5  ? diazepam (VALIUM) 2 MG tablet Take 2 mg by mouth every 8 (eight) hours as needed for anxiety.       ? finasteride (PROSCAR) 5 MG tablet Take 5 mg by mouth daily.      ? Flaxseed Oil OIL Take 1,000 mg by mouth daily.      ? irbesartan (AVAPRO) 300 MG tablet Take 1 tablet (300 mg  total) by mouth daily. 90 tablet 3  ? Misc Natural Products (NEURIVA) CAPS Take 1 capsule by mouth daily. For memory      ? Multiple Vitamin (MULTIVITAMIN WITH MINERALS) TABS tablet Take 1 tablet by mouth daily. Adult Multivitamin 50+      ? Omega-3 Fatty Acids (FISH OIL) 1000 MG CAPS Take 1,000 mg by mouth daily.      ? Polyethyl Glycol-Propyl Glycol 0.4-0.3 % SOLN Place 1 drop into both eyes 3 (three) times daily as needed (for dry eyes.).      ? rosuvastatin (CRESTOR) 20 MG tablet TAKE ONE TABLET ('20MG'$  TOTAL) BY MOUTH DAILY 90 tablet 3  ? Saw Palmetto 450 MG CAPS Take 900 mg by mouth daily.      ? trospium (SANCTURA) 20 MG tablet Take 20 mg by mouth daily.      ? warfarin (COUMADIN) 5 MG tablet TAKE ONE TABLET BY MOUTH EVERY DAY AS DIRECTED BY COUMADIN CLINIC 90 tablet 1  ?  ?No facility-administered medications prior to visit.  ?  ?  ?Allergies:   Patient has no known allergies.  ?  ?Social History  ?  ?     ?Socioeconomic History  ? Marital status: Married  ?    Spouse name: Not on file  ? Number of children: 0  ? Years of education: Not on file  ? Highest education level: Not on file  ?Occupational History  ? Not on file  ?Tobacco Use  ? Smoking status: Never  ? Smokeless tobacco: Never  ?Vaping Use  ? Vaping Use: Never used  ?Substance and Sexual Activity  ? Alcohol use: Yes  ?    Comment: occasionally wine   ? Drug use: No  ? Sexual activity: Not on file  ?Other Topics Concern  ? Not on file  ?Social History Narrative  ?  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  ?  Right Handed   ?  Lives in a two story hom

## 2022-01-24 NOTE — Telephone Encounter (Signed)
Please make an appointment for him for the A-fib clinic today or tomorrow. ?We will try to overdrive pace again.  It is possible that we may need to admit him to start dofetilide or amiodarone and get an EP consult. ? ?

## 2022-01-24 NOTE — Telephone Encounter (Signed)
Patient has been called and and notified of atrial fibrillation appointment at 2 pm. Instructions given and parking deck code.  ?

## 2022-01-24 NOTE — Telephone Encounter (Signed)
Returned the call to the patient and his wife. She stated that his feet are still swollen but the abdomen swelling has gone down. Current weight on their home scale is 210 lbs. ? ?She stated that yesterday, he speech was slow and he couldn't think straight. This has resolved as of today.  ? ?His blood pressure last night was 141/92 and heart rate was 144.  ? ?This morning it was 111/97 and heart rate was 142. This was before his medications.  ? ?He has complaints of mild complaints of shortness of breath and a non productive cough. He denies chest pain.  ? ?They will send a MyChart message with current blood pressure. They could not get the machine to work while on the phone.  ?

## 2022-01-24 NOTE — Addendum Note (Signed)
Encounter addended by: Sanda Klein, MD on: 01/24/2022 2:47 PM ? Actions taken: Clinical Note Signed, Charge Capture section accepted

## 2022-01-24 NOTE — Progress Notes (Signed)
ANTICOAGULATION CONSULT NOTE - Initial Consult ? ?Pharmacy Consult for coumadin ?Indication: atrial fibrillation ? ?No Known Allergies ? ?Patient Measurements: ?Height: 6' (182.9 cm) ?Weight: 95.3 kg (210 lb) ?IBW/kg (Calculated) : 77.6 ?Heparin Dosing Weight:  ? ?Vital Signs: ?Temp: 97.9 ?F (36.6 ?C) (04/17 1751) ?Temp Source: Oral (04/17 1751) ?BP: 132/97 (04/17 1843) ?Pulse Rate: 142 (04/17 1751) ? ?Labs: ?Recent Labs  ?  01/24/22 ?1427  ?LABPROT 31.8*  ?INR 3.1*  ?CREATININE 1.54*  ? ? ?Estimated Creatinine Clearance: 54.2 mL/min (A) (by C-G formula based on SCr of 1.54 mg/dL (H)). ? ? ?Medical History: ?Past Medical History:  ?Diagnosis Date  ? Abnormal echocardiogram   ? stress myoview 10/29/2010  ? Arthritis   ? " IN MY BACK "  ? Dyslipidemia   ? Dysrhythmia   ? Atrial fibrillation  ? Hypertension   ? Paroxysmal atrial fibrillation (HCC)   ? Presence of permanent cardiac pacemaker   ? Sinus node dysfunction (HCC)   ? Sleep apnea, obstructive   ? sleep study 10/09/2007  AHI 5.73/hr  REM AHI 11.90/hr   ? 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 LEFT THUMB   ? Stroke [facility])   ? Syncope   ? 2D Echocardiogram 09/27/2010 EF between 40 to 45%  ? Tachycardia-bradycardia [facility])   ? Transient ischemic attack (TIA) 06/07  ? ? ?Medications:  ?Medications Prior to Admission  ?Medication Sig Dispense Refill Last Dose  ? carvedilol (COREG) 25 MG tablet Take 2 tablets (50 mg total) by mouth 2 (two) times daily with a meal. 120 tablet 5 01/24/2022 at 0830  ? Cyanocobalamin (B-12 PO) Take 1 capsule by mouth daily.   01/24/2022  ? diazepam (VALIUM) 2 MG tablet Take 2 mg by mouth every 8 (eight) hours as needed for anxiety.    01/24/2022  ? finasteride (PROSCAR) 5 MG tablet Take 5 mg by mouth daily.   01/24/2022  ? Flaxseed Oil OIL Take 1,000 mg by mouth daily.   01/24/2022  ? furosemide (LASIX) 40 MG tablet Take one tablet daily until your weight reaches 200 lbs or less, then stop. (Patient taking differently: Take 40 mg by  mouth daily. Take one tablet daily until your weight reaches 200 lbs or less, then stop.) 30 tablet 0 01/24/2022  ? irbesartan (AVAPRO) 300 MG tablet Take 1 tablet (300 mg total) by mouth daily. 90 tablet 3 01/24/2022  ? Misc Natural Products (NEURIVA) CAPS Take 1 capsule by mouth daily. For memory   01/23/2022  ? Multiple Vitamin (MULTIVITAMIN WITH MINERALS) TABS tablet Take 1 tablet by mouth daily. Adult Multivitamin 50+   01/24/2022  ? Omega-3 Fatty Acids (FISH OIL) 1000 MG CAPS Take 1,000 mg by mouth daily.   01/24/2022  ? Polyethyl Glycol-Propyl Glycol 0.4-0.3 % SOLN Place 1 drop into both eyes 3 (three) times daily as needed (for dry eyes.).   Past Week  ? potassium chloride 20 MEQ TBCR Take one tablet daily with your furosemide until your weight reaches 200 lbs or less, then stop. (Patient taking differently: Take 20 mEq by mouth daily. Take one tablet daily with your furosemide until your weight reaches 200 lbs or less, then stop.) 30 tablet 0 01/24/2022  ? rosuvastatin (CRESTOR) 20 MG tablet TAKE ONE TABLET ('20MG'$  TOTAL) BY MOUTH DAILY (Patient taking differently: Take 20 mg by mouth daily.) 90 tablet 3 01/23/2022  ? Saw Palmetto 450 MG CAPS Take 900 mg by mouth daily.   01/24/2022  ? trospium (  SANCTURA) 20 MG tablet Take 20 mg by mouth daily.   01/23/2022  ? warfarin (COUMADIN) 5 MG tablet TAKE ONE TABLET BY MOUTH EVERY DAY AS DIRECTED BY COUMADIN CLINIC (Patient taking differently: Take 2.5-5 mg by mouth daily. Take 1/2 tablet (2.5 mg) on Mon, Wed, and Fri. Then take 1 tablet (5 mg) on Tues, Thurs, Sat, and Sun or as directed by Coumadin Clinic) 90 tablet 1 01/23/2022 at 2100  ? ?Scheduled:  ? [START ON 01/25/2022] carvedilol  50 mg Oral BID WC  ? darifenacin  7.5 mg Oral Daily  ? dofetilide  500 mcg Oral BID  ? [START ON 01/25/2022] finasteride  5 mg Oral Daily  ? [START ON 01/25/2022] furosemide  40 mg Oral Daily  ? [START ON 01/25/2022] irbesartan  300 mg Oral Daily  ? [START ON 01/25/2022] potassium chloride  20 mEq  Oral Daily  ? potassium chloride  40 mEq Oral Once  ? rosuvastatin  20 mg Oral QHS  ? sodium chloride flush  3 mL Intravenous Q12H  ? ?Infusions:  ? sodium chloride    ? ? ?Assessment: ?Pt with a hx of PAF who is being admitted for Tikosyn initiation. INR 3.1 today. Will give his standard home dose today and f/u with INR in AM.  ? ?Goal of Therapy:  ?INR 2-3 ?Monitor platelets by anticoagulation protocol: Yes ?  ?Plan:  ?Coumadin 2.'5mg'$  PO x1 ?Daily INR ? ?Onnie Boer, PharmD, BCIDP, AAHIVP, CPP ?Infectious Disease Pharmacist ?01/24/2022 7:07 PM ? ? ? ? ?

## 2022-01-24 NOTE — Progress Notes (Signed)
Patient's heart rate continues to be 140s sustained, unable to finish a complete sentence without dyspnea, lungs with bibasilar rales, sats 94% on room air, pt reports nonproductive cough, BLE +1 edema noted as well.  Pt states no change in how he is feeling or breathing at present, reports he has been feeling this way at home for a while.  Him and his wife state he is still has 12 pounds of fluid on him.  Pt has voided once in urinal and emptied by wife.  Notified Dr. Garey Ham and orders received for lasix 40 mg IV x 1.  Medication given per orders and pt has voided 450 ml thus far.  Will cont to monitor pt closely.   ?

## 2022-01-24 NOTE — Progress Notes (Signed)
After initial improvement in symptoms with conversion to atrial fibrillation and slower ventricular rates, Alex Maddox went back into atrial flutter with 2: 1 AV block and ventricular rates of 150 bpm sometime on Saturday.  He has been feeling poorly again. ?Attempted overdrive pacing in the A-fib clinic.  Original flutter cycle length is around 210 ms.  Attempts at overdrive pacing at 117 ms, 190 ms, 180 ms were unsuccessful despite obvious entrainment of the atrial mechanism.  After burst overdrive pacing at 356 ms the rhythm converted to atrial fibrillation and the ventricular response promptly dropped to 80-90 bpm. ?He is already on very high beta-blocker dose (carvedilol 50 mg twice daily). ?Anticipate that he may reorganize to atrial flutter and again become symptomatic with heart failure exacerbation.  Briefly discussed the situation with Dr. Caryl Comes.  Discussed options for dofetilide versus amiodarone versus atrial fibrillation and flutter ablation down the road.   ?Will admit for dofetilide loading. ?

## 2022-01-24 NOTE — Progress Notes (Signed)
?   01/24/22 1751  ?Assess: MEWS Score  ?Temp 97.9 ?F (36.6 ?C)  ?BP (!) 124/98  ?Pulse Rate (!) 142  ?ECG Heart Rate (!) 142  ?Resp (!) 26  ?Level of Consciousness Alert  ?SpO2 97 %  ?O2 Device Room Air  ?Assess: MEWS Score  ?MEWS Temp 0  ?MEWS Systolic 0  ?MEWS Pulse 3  ?MEWS RR 2  ?MEWS LOC 0  ?MEWS Score 5  ?MEWS Score Color Red  ?Assess: if the MEWS score is Yellow or Red  ?Were vital signs taken at a resting state? Yes  ?Focused Assessment No change from prior assessment  ?Early Detection of Sepsis Score *See Row Information* Low  ?MEWS guidelines implemented *See Row Information* Yes  ?Treat  ?MEWS Interventions Other (Comment) ?(notified MD of arrival)  ?Pain Scale 0-10  ?Pain Score 0  ?Patients Stated Pain Goal 0  ?Take Vital Signs  ?Increase Vital Sign Frequency  Red: Q 1hr X 4 then Q 4hr X 4, if remains red, continue Q 4hrs  ?Escalate  ?MEWS: Escalate Red: discuss with charge nurse/RN and provider, consider discussing with RRT  ?Notify: Charge Nurse/RN  ?Name of Charge Nurse/RN Notified Abrahm Mancia  ?Date Charge Nurse/RN Notified 01/24/22  ?Time Charge Nurse/RN Notified 1754  ?Notify: Provider  ?Provider Name/Title Cecilie Kicks NP  ?Date Provider Notified 01/24/22  ?Time Provider Notified 1757  ?Notification Type Page  ?Notification Reason Other (Comment) ?(red MEWS score)  ?Document  ?Patient Outcome Other (Comment) ?(new admission to floor at this time)  ?Progress note created (see row info) Yes  ? ? ?

## 2022-01-24 NOTE — Progress Notes (Signed)
? ? ?Primary Care Physician: Sharilyn Sites, MD ?Primary Cardiologist: Dr Sallyanne Kuster ?Primary Electrophysiologist: none ?Referring Physician: Dr Sallyanne Kuster  ? ? ?Alex Maddox is a 71 y.o. male with a history of OSA, coronary calcifications, CHF, thoracic and abdominal aortic aneurysm, neurally mediated syncope, tachycardia-bradycardia syndrome and pacemaker, atrial fibrillation, atrial flutter who presents for consultation in the [facility]. Patient is on warfarin for a CHADS2VASC score of 4. He was seen by Dr Sallyanne Kuster 01/21/22 in rapid atrial flutter. Overdrive pacing with his device converted him to rate controlled afib. Patient called [facility] with heart rates back up to 140s with some SOB and lower extremity edema.  ? ?Today, he denies symptoms of palpitations, chest pain, shortness of breath, orthopnea, PND, lower extremity edema, dizziness, presyncope, syncope, snoring, daytime somnolence, bleeding, or neurologic sequela. The patient is tolerating medications without difficulties and is otherwise without complaint today.  ? ? ?Atrial Fibrillation Risk Factors: ? ?he does have symptoms or diagnosis of sleep apnea. ?he is working on starting CPAP. ?he does not have a history of rheumatic fever. ? ? ?he has a BMI of Body mass index is 28.43 kg/m?Marland KitchenMarland Kitchen ?Filed Weights  ? 01/24/22 1357  ?Weight: 95.1 kg  ? ? ?Family History  ?Problem Relation Age of Onset  ? Hypertension Mother   ? Heart attack Father 69  ?     deceased from heart attack  ? Hypertension Brother   ? Colon cancer Neg Hx   ? ? ? ?Atrial Fibrillation Management history: ? ?Previous antiarrhythmic drugs: none ?Previous cardioversions: none ?Previous ablations: none ?CHADS2VASC score: 4 ?Anticoagulation history: warfarin  ? ? ?Past Medical History:  ?Diagnosis Date  ? Abnormal echocardiogram   ? stress myoview 10/29/2010  ? Arthritis   ? " IN MY BACK "  ? Dyslipidemia   ? Dysrhythmia   ? Atrial fibrillation  ? Hypertension   ?  Paroxysmal atrial fibrillation (HCC)   ? Presence of permanent cardiac pacemaker   ? Sinus node dysfunction (HCC)   ? Sleep apnea, obstructive   ? sleep study 10/09/2007  AHI 5.73/hr  REM AHI 11.90/hr   ? 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 LEFT THUMB   ? Stroke [facility])   ? Syncope   ? 2D Echocardiogram 09/27/2010 EF between 40 to 45%  ? Tachycardia-bradycardia [facility])   ? Transient ischemic attack (TIA) 06/07  ? ?Past Surgical History:  ?Procedure Laterality Date  ? COLONOSCOPY    ? COLONOSCOPY N/A 06/07/2017  ? Procedure: COLONOSCOPY;  Surgeon: Rogene Houston, MD;  [facility];  Service: Endoscopy;  Laterality: N/A;  9:30  ? COLONOSCOPY N/A 09/20/2018  ? Procedure: COLONOSCOPY;  Surgeon: Rogene Houston, MD;  [facility];  Service: Endoscopy;  Laterality: N/A;  1030  ? ELECTROCARDIOGRAM    ? INSERT / REPLACE / REMOVE PACEMAKER  05/2011  ? LOOP RECORDER IMPLANT  02/02/2011  ? Medtronic- CRM   ? PACEMAKER INSERTION  05/30/2011  ? Medtronic Revo   last checked 11/07/2012  ? POLYPECTOMY  09/20/2018  ? Procedure: POLYPECTOMY;  Surgeon: Rogene Houston, MD;  [facility];  Service: Endoscopy;;  colon  ? PPM GENERATOR CHANGEOUT N/A 12/30/2020  ? Procedure: PPM GENERATOR CHANGEOUT;  Surgeon: Sanda Klein, MD;  Location: [place] CV LAB;  Service: Cardiovascular;  Laterality: N/A;  ? ? ?Current Outpatient Medications  ?Medication Sig Dispense Refill  ? carvedilol (COREG) 25 MG tablet Take 2  tablets (50 mg total) by mouth 2 (two) times daily with a meal. 120 tablet 5  ? diazepam (VALIUM) 2 MG tablet Take 2 mg by mouth every 8 (eight) hours as needed for anxiety.     ? finasteride (PROSCAR) 5 MG tablet Take 5 mg by mouth daily.    ? Flaxseed Oil OIL Take 1,000 mg by mouth daily.    ? furosemide (LASIX) 40 MG tablet Take one tablet daily until your weight reaches 200 lbs or less, then stop. 30 tablet 0  ? irbesartan (AVAPRO) 300 MG tablet Take 1 tablet (300 mg total) by mouth  daily. 90 tablet 3  ? Misc Natural Products (NEURIVA) CAPS Take 1 capsule by mouth daily. For memory    ? Multiple Vitamin (MULTIVITAMIN WITH MINERALS) TABS tablet Take 1 tablet by mouth daily. Adult Multivitamin 50+    ? Omega-3 Fatty Acids (FISH OIL) 1000 MG CAPS Take 1,000 mg by mouth daily.    ? Polyethyl Glycol-Propyl Glycol 0.4-0.3 % SOLN Place 1 drop into both eyes 3 (three) times daily as needed (for dry eyes.).    ? potassium chloride 20 MEQ TBCR Take one tablet daily with your furosemide until your weight reaches 200 lbs or less, then stop. 30 tablet 0  ? rosuvastatin (CRESTOR) 20 MG tablet TAKE ONE TABLET ('20MG'$  TOTAL) BY MOUTH DAILY 90 tablet 3  ? Saw Palmetto 450 MG CAPS Take 900 mg by mouth daily.    ? trospium (SANCTURA) 20 MG tablet Take 20 mg by mouth daily.    ? warfarin (COUMADIN) 5 MG tablet TAKE ONE TABLET BY MOUTH EVERY DAY AS DIRECTED BY COUMADIN CLINIC 90 tablet 1  ? ?No current facility-administered medications for this encounter.  ? ? ?No Known Allergies ? ?Social History  ? ?Socioeconomic History  ? Marital status: Married  ?  Spouse name: Not on file  ? Number of children: 0  ? Years of education: Not on file  ? Highest education level: Not on file  ?Occupational History  ? Not on file  ?Tobacco Use  ? Smoking status: Never  ? Smokeless tobacco: Never  ?Vaping Use  ? Vaping Use: Never used  ?Substance and Sexual Activity  ? Alcohol use: Yes  ?  Comment: occasionally wine  ? Drug use: No  ? Sexual activity: Not on file  ?Other Topics Concern  ? Not on file  ?Social History Narrative  ? 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  ? Right Handed   ? Lives in a two story home  ? ?Social Determinants of Health  ? ?Financial Resource Strain: Not on file  ?Food Insecurity: Not on file  ?Transportation Needs: Not on file  ?Physical Activity: Not on file  ?Stress: Not on file  ?Social Connections: Not on file  ?Intimate Partner Violence: Not on file  ? ? ? ?ROS- All systems are reviewed and  negative except as per the HPI above. ? ?Physical Exam: ?Vitals:  ? 01/24/22 1357  ?Weight: 95.1 kg  ?Height: 6' (1.829 m)  ? ? ?GEN- The patient is a well appearing male, alert and oriented x 3 today.   ?Head- normocephalic, atraumatic ?Eyes-  Sclera clear, conjunctiva pink ?Ears- hearing intact ?Oropharynx- clear ?Neck- supple  ?Lungs- Clear to ausculation bilaterally, normal work of breathing ?Heart- irregular rate and rhythm, no murmurs, rubs or gallops  ?GI- soft, NT, ND, + BS ?Extremities- no clubbing, cyanosis, or edema ?MS- no significant deformity or atrophy ?Skin- no rash  or lesion ?Psych- euthymic mood, full affect ?Neuro- strength and sensation are intact ? ?Wt Readings from Last 3 Encounters:  ?01/24/22 95.1 kg  ?01/21/22 97 kg  ?08/24/21 92 kg  ? ? ?EKG today demonstrates  ?Atrial flutter with 2:1 block ?Vent. rate 142 BPM ?PR interval 132 ms ?QRS duration 148 ms ?QT/QTcB 286/439 ms ? ? ?Epic records are reviewed at length today ? ?CHA2DS2-VASc Score = 4  ?The patient's score is based upon: ?CHF History: 1 ?HTN History: 1 ?Diabetes History: 0 ?Stroke History: 0 ?Vascular Disease History: 1 ?Age Score: 1 ?Gender Score: 0 ?    ? ? ?ASSESSMENT AND PLAN: ?1. Paroxysmal Atrial Fibrillation/atrial flutter ?The patient's CHA2DS2-VASc score is 4, indicating a 4.8% annual risk of stroke.   ?Patient with recurrent rapid atrial flutter. Typically rate controlled when in afib. ?Seen with Dr Sallyanne Kuster. Burst pacing attempted which did convert him to rate controlled afib. ?Patient to be direct admitted to cardiology service and EP consulted for dofetilide loading. Could consider ablation for long term rhythm control.   ?Check bmet/mag/INR today ?Continue warfarin ?Continue carvedilol 50 mg BID ? ?2. Secondary Hypercoagulable State (ICD10:  D68.69) ?The patient is at significant risk for stroke/thromboembolism based upon his CHA2DS2-VASc Score of 4.  Continue Warfarin (Coumadin).  ? ?3. Tachybradycardia syndrome ?S/p  PPM, followed by Dr Sallyanne Kuster  ? ?4. Obstructive sleep apnea ?The importance of adequate treatment of sleep apnea was discussed today in order to improve our ability to maintain sinus rhythm long term. ?Followe

## 2022-01-25 ENCOUNTER — Other Ambulatory Visit: Payer: Self-pay

## 2022-01-25 ENCOUNTER — Inpatient Hospital Stay (HOSPITAL_COMMUNITY): Payer: Medicare Other

## 2022-01-25 ENCOUNTER — Other Ambulatory Visit (HOSPITAL_COMMUNITY): Payer: Self-pay

## 2022-01-25 ENCOUNTER — Encounter (HOSPITAL_COMMUNITY): Payer: Self-pay | Admitting: Cardiovascular Disease

## 2022-01-25 DIAGNOSIS — I4892 Unspecified atrial flutter: Secondary | ICD-10-CM

## 2022-01-25 DIAGNOSIS — I483 Typical atrial flutter: Secondary | ICD-10-CM | POA: Diagnosis not present

## 2022-01-25 LAB — ECHOCARDIOGRAM COMPLETE
Area-P 1/2: 3.39 cm2
Height: 72 in
MV M vel: 4.88 m/s
MV Peak grad: 95.3 mmHg
Radius: 0.6 cm
S' Lateral: 3 cm
Weight: 3238.4 oz

## 2022-01-25 LAB — BASIC METABOLIC PANEL
Anion gap: 10 (ref 5–15)
BUN: 23 mg/dL (ref 8–23)
CO2: 25 mmol/L (ref 22–32)
Calcium: 9.1 mg/dL (ref 8.9–10.3)
Chloride: 103 mmol/L (ref 98–111)
Creatinine, Ser: 1.59 mg/dL — ABNORMAL HIGH (ref 0.61–1.24)
GFR, Estimated: 47 mL/min — ABNORMAL LOW (ref >60)
Glucose, Bld: 125 mg/dL — ABNORMAL HIGH (ref 70–99)
Potassium: 3.4 mmol/L — ABNORMAL LOW (ref 3.5–5.1)
Sodium: 138 mmol/L (ref 135–145)

## 2022-01-25 LAB — PROTIME-INR
INR: 3 — ABNORMAL HIGH (ref 0.8–1.2)
Prothrombin Time: 30.8 seconds — ABNORMAL HIGH (ref 11.4–15.2)

## 2022-01-25 LAB — HIV ANTIBODY (ROUTINE TESTING W REFLEX): HIV Screen 4th Generation wRfx: NONREACTIVE

## 2022-01-25 LAB — MAGNESIUM: Magnesium: 2.2 mg/dL (ref 1.7–2.4)

## 2022-01-25 MED ORDER — DOFETILIDE 250 MCG PO CAPS
250.0000 ug | ORAL_CAPSULE | Freq: Two times a day (BID) | ORAL | Status: DC
  Administered 2022-01-25 – 2022-01-27 (×5): 250 ug via ORAL
  Filled 2022-01-25 (×5): qty 1

## 2022-01-25 MED ORDER — WARFARIN SODIUM 5 MG PO TABS
5.0000 mg | ORAL_TABLET | Freq: Once | ORAL | Status: AC
  Administered 2022-01-25: 5 mg via ORAL
  Filled 2022-01-25: qty 1

## 2022-01-25 MED ORDER — POTASSIUM CHLORIDE CRYS ER 20 MEQ PO TBCR
40.0000 meq | EXTENDED_RELEASE_TABLET | ORAL | Status: AC
  Administered 2022-01-25 (×2): 40 meq via ORAL
  Filled 2022-01-25 (×2): qty 2

## 2022-01-25 NOTE — Progress Notes (Addendum)
Full note/Consult pending.  ? ?QT borderline long. Will decrease tikosyn to 250 mcg per Dr. Lovena Le.  ? ?K <3.5 plan for aggressive supp.  Mg stable.  ? ?On coumadin for Masthope.  ? ? ?Legrand Como [address]" Chalmers Cater, PA-C  ?01/25/2022 7:16 AM  ?

## 2022-01-25 NOTE — Progress Notes (Signed)
Morning EKG reviewed   ? ?Shows remains in NSR at 69 bpm with borderline QTc at ~480-490 ms when measured manually and corrected for QRS ? ?Continue  Tikosyn 250 mcg BID for now.  ? ?Pt will not require DCCV  ? ?Shirley Friar, PA-C  ?Pager: [phone]  ?01/25/2022 11:17 AM  ? ?

## 2022-01-25 NOTE — Progress Notes (Signed)
ANTICOAGULATION CONSULT NOTE ? ?Pharmacy Consult for coumadin ?Indication: atrial fibrillation ? ?No Known Allergies ? ?Patient Measurements: ?Height: 6' (182.9 cm) ?Weight: 95.3 kg (210 lb) ?IBW/kg (Calculated) : 77.6 ?Heparin Dosing Weight:  ? ?Vital Signs: ?Temp: 98.1 ?F (36.7 ?C) (04/18 0448) ?Temp Source: Oral (04/18 0448) ?BP: 123/91 (04/18 0448) ?Pulse Rate: 70 (04/18 0448) ? ?Labs: ?Recent Labs  ?  01/24/22 ?1427 01/25/22 ?0412  ?LABPROT 31.8*  --   ?INR 3.1*  --   ?CREATININE 1.54* 1.59*  ? ? ? ?Estimated Creatinine Clearance: 52.5 mL/min (A) (by C-G formula based on SCr of 1.59 mg/dL (H)). ? ? ?Medical History: ?Past Medical History:  ?Diagnosis Date  ? Abnormal echocardiogram   ? stress myoview 10/29/2010  ? Arthritis   ? " IN MY BACK "  ? Dyslipidemia   ? Dysrhythmia   ? Atrial fibrillation  ? Hypertension   ? Paroxysmal atrial fibrillation (HCC)   ? Presence of permanent cardiac pacemaker   ? Sinus node dysfunction (HCC)   ? Sleep apnea, obstructive   ? sleep study 10/09/2007  AHI 5.73/hr  REM AHI 11.90/hr   ? 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 LEFT THUMB   ? Stroke [facility])   ? Syncope   ? 2D Echocardiogram 09/27/2010 EF between 40 to 45%  ? Tachycardia-bradycardia [facility])   ? Transient ischemic attack (TIA) 06/07  ? ? ?Medications:  ?Medications Prior to Admission  ?Medication Sig Dispense Refill Last Dose  ? carvedilol (COREG) 25 MG tablet Take 2 tablets (50 mg total) by mouth 2 (two) times daily with a meal. 120 tablet 5 01/24/2022 at 0830  ? Cyanocobalamin (B-12 PO) Take 1 capsule by mouth daily.   01/24/2022  ? diazepam (VALIUM) 2 MG tablet Take 2 mg by mouth every 8 (eight) hours as needed for anxiety.    01/24/2022  ? finasteride (PROSCAR) 5 MG tablet Take 5 mg by mouth daily.   01/24/2022  ? Flaxseed Oil OIL Take 1,000 mg by mouth daily.   01/24/2022  ? furosemide (LASIX) 40 MG tablet Take one tablet daily until your weight reaches 200 lbs or less, then stop. (Patient taking differently:  Take 40 mg by mouth daily. Take one tablet daily until your weight reaches 200 lbs or less, then stop.) 30 tablet 0 01/24/2022  ? irbesartan (AVAPRO) 300 MG tablet Take 1 tablet (300 mg total) by mouth daily. 90 tablet 3 01/24/2022  ? Misc Natural Products (NEURIVA) CAPS Take 1 capsule by mouth daily. For memory   01/23/2022  ? Multiple Vitamin (MULTIVITAMIN WITH MINERALS) TABS tablet Take 1 tablet by mouth daily. Adult Multivitamin 50+   01/24/2022  ? Omega-3 Fatty Acids (FISH OIL) 1000 MG CAPS Take 1,000 mg by mouth daily.   01/24/2022  ? Polyethyl Glycol-Propyl Glycol 0.4-0.3 % SOLN Place 1 drop into both eyes 3 (three) times daily as needed (for dry eyes.).   Past Week  ? potassium chloride 20 MEQ TBCR Take one tablet daily with your furosemide until your weight reaches 200 lbs or less, then stop. (Patient taking differently: Take 20 mEq by mouth daily. Take one tablet daily with your furosemide until your weight reaches 200 lbs or less, then stop.) 30 tablet 0 01/24/2022  ? rosuvastatin (CRESTOR) 20 MG tablet TAKE ONE TABLET ('20MG'$  TOTAL) BY MOUTH DAILY (Patient taking differently: Take 20 mg by mouth daily.) 90 tablet 3 01/23/2022  ? Saw Palmetto 450 MG CAPS Take 900 mg by mouth  daily.   01/24/2022  ? trospium (SANCTURA) 20 MG tablet Take 20 mg by mouth daily.   01/23/2022  ? warfarin (COUMADIN) 5 MG tablet TAKE ONE TABLET BY MOUTH EVERY DAY AS DIRECTED BY COUMADIN CLINIC (Patient taking differently: Take 2.5-5 mg by mouth daily. Take 1/2 tablet (2.5 mg) on Mon, Wed, and Fri. Then take 1 tablet (5 mg) on Tues, Thurs, Sat, and Sun or as directed by Coumadin Clinic) 90 tablet 1 01/23/2022 at 2100  ? ?Scheduled:  ? carvedilol  50 mg Oral BID WC  ? darifenacin  7.5 mg Oral Daily  ? dofetilide  500 mcg Oral BID  ? finasteride  5 mg Oral Daily  ? furosemide  40 mg Oral Daily  ? irbesartan  300 mg Oral Daily  ? potassium chloride  20 mEq Oral Daily  ? potassium chloride  40 mEq Oral Q4H  ? rosuvastatin  20 mg Oral QHS  ? sodium  chloride flush  3 mL Intravenous Q12H  ? Warfarin - Pharmacist Dosing Inpatient   Does not apply [id number]  ? ?Infusions:  ? sodium chloride    ? ? ?Assessment: ?Pt with a hx of PAF on Coumadin who is being admitted for Tikosyn initiation. INR is 3 - therapeutic on upper end of range. Home dose 2.'5mg'$  MWF, '5mg'$  AODs.  ? ?Goal of Therapy:  ?INR 2-3 ?Monitor platelets by anticoagulation protocol: Yes ?  ?Plan:  ?Coumadin '5mg'$  x1 ?Daily INR ? ?Arrie Senate, PharmD, BCPS, BCCP ?Clinical Pharmacist ?[phone] ?Please check AMION for all [place] numbers ?01/25/2022 ? ? ? ? ?

## 2022-01-25 NOTE — Consult Note (Addendum)
? ?ELECTROPHYSIOLOGY CONSULT NOTE  ? ? ?Patient ID: Alex Maddox ?MRN: [id number], DOB/AGE: 1951-01-06 71 y.o. ? ?Admit date: 01/24/2022 ?Date of Consult: 01/25/2022 ? ?Primary Physician: Sharilyn Sites, MD ?Primary Cardiologist: Sanda Klein, MD  ?Electrophysiologist:  New to EP team, PPM followed by Dr. Sallyanne Kuster ? ?Referring Provider: Dr. Sallyanne Kuster ? ?Patient Profile: ?Alex Maddox is a 71 y.o. male with a history of OSA, coronary calcifications, CHF, thoracic and abdominal aortic aneurysm, neurally mediated syncope, tachycardia-bradycardia syndrome and pacemaker, atrial fibrillation and atrial flutter who is being seen today for the evaluation of Alex Maddox at the request of Dr. Sallyanne Kuster. ? ?HPI:  ?Alex Maddox is a 71 y.o. male with medical history as above. He was seen by Dr Sallyanne Kuster 01/21/22 in rapid atrial flutter. Overdrive pacing with his device converted him to rate controlled afib. Patient called [facility] with heart rates back up to 140s with some SOB and lower extremity edema.  ? ?He was seen in AF clinic 4/17 and again attempted atrial overdrive pacing, which again converted to controlled AF. Pt was discussed with Dr. Caryl Comes who recommended Alex.  Pt was admitted from AF clinic with stable labs and INR.  ? ?Pt converted overnight with Alex dose, but QT slightly prolonged, so dose decreased for this am. He is without complaint at rest currently.  ? ?Past Medical History:  ?Diagnosis Date  ? Abnormal echocardiogram   ? stress myoview 10/29/2010  ? Arthritis   ? " IN MY BACK "  ? Dyslipidemia   ? Dysrhythmia   ? Atrial fibrillation  ? Hypertension   ? Paroxysmal atrial fibrillation (HCC)   ? Presence of permanent cardiac pacemaker   ? Sinus node dysfunction (HCC)   ? Sleep apnea, obstructive   ? sleep study 10/09/2007  AHI 5.73/hr  REM AHI 11.90/hr   ? 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 LEFT THUMB   ? Stroke [facility])   ? Syncope   ? 2D Echocardiogram 09/27/2010 EF between 40 to 45%  ?  Tachycardia-bradycardia [facility])   ? Transient ischemic attack (TIA) 06/07  ?  ? ?Surgical History:  ?Past Surgical History:  ?Procedure Laterality Date  ? COLONOSCOPY    ? COLONOSCOPY N/A 06/07/2017  ? Procedure: COLONOSCOPY;  Surgeon: Rogene Houston, MD;  [facility];  Service: Endoscopy;  Laterality: N/A;  9:30  ? COLONOSCOPY N/A 09/20/2018  ? Procedure: COLONOSCOPY;  Surgeon: Rogene Houston, MD;  [facility];  Service: Endoscopy;  Laterality: N/A;  1030  ? ELECTROCARDIOGRAM    ? INSERT / REPLACE / REMOVE PACEMAKER  05/2011  ? LOOP RECORDER IMPLANT  02/02/2011  ? Medtronic- CRM   ? PACEMAKER INSERTION  05/30/2011  ? Medtronic Revo   last checked 11/07/2012  ? POLYPECTOMY  09/20/2018  ? Procedure: POLYPECTOMY;  Surgeon: Rogene Houston, MD;  [facility];  Service: Endoscopy;;  colon  ? PPM GENERATOR CHANGEOUT N/A 12/30/2020  ? Procedure: PPM GENERATOR CHANGEOUT;  Surgeon: Sanda Klein, MD;  Location: [place] [facility] LAB;  Service: Cardiovascular;  Laterality: N/A;  ?  ? ?Medications Prior to Admission  ?Medication Sig Dispense Refill Last Dose  ? carvedilol (COREG) 25 MG tablet Take 2 tablets (50 mg total) by mouth 2 (two) times daily with a meal. 120 tablet 5 01/24/2022 at 0830  ? Cyanocobalamin (B-12 PO) Take 1 capsule by mouth daily.   01/24/2022  ? diazepam (VALIUM) 2 MG tablet Take 2  mg by mouth every 8 (eight) hours as needed for anxiety.    01/24/2022  ? finasteride (PROSCAR) 5 MG tablet Take 5 mg by mouth daily.   01/24/2022  ? Flaxseed Oil OIL Take 1,000 mg by mouth daily.   01/24/2022  ? furosemide (LASIX) 40 MG tablet Take one tablet daily until your weight reaches 200 lbs or less, then stop. (Patient taking differently: Take 40 mg by mouth daily. Take one tablet daily until your weight reaches 200 lbs or less, then stop.) 30 tablet 0 01/24/2022  ? irbesartan (AVAPRO) 300 MG tablet Take 1 tablet (300 mg total) by mouth daily. 90 tablet 3 01/24/2022  ? Misc Natural Products  (NEURIVA) CAPS Take 1 capsule by mouth daily. For memory   01/23/2022  ? Multiple Vitamin (MULTIVITAMIN WITH MINERALS) TABS tablet Take 1 tablet by mouth daily. Adult Multivitamin 50+   01/24/2022  ? Omega-3 Fatty Acids (FISH OIL) 1000 MG CAPS Take 1,000 mg by mouth daily.   01/24/2022  ? Polyethyl Glycol-Propyl Glycol 0.4-0.3 % SOLN Place 1 drop into both eyes 3 (three) times daily as needed (for dry eyes.).   Past Week  ? potassium chloride 20 MEQ TBCR Take one tablet daily with your furosemide until your weight reaches 200 lbs or less, then stop. (Patient taking differently: Take 20 mEq by mouth daily. Take one tablet daily with your furosemide until your weight reaches 200 lbs or less, then stop.) 30 tablet 0 01/24/2022  ? rosuvastatin (CRESTOR) 20 MG tablet TAKE ONE TABLET ('20MG'$  TOTAL) BY MOUTH DAILY (Patient taking differently: Take 20 mg by mouth daily.) 90 tablet 3 01/23/2022  ? Saw Palmetto 450 MG CAPS Take 900 mg by mouth daily.   01/24/2022  ? trospium (SANCTURA) 20 MG tablet Take 20 mg by mouth daily.   01/23/2022  ? warfarin (COUMADIN) 5 MG tablet TAKE ONE TABLET BY MOUTH EVERY DAY AS DIRECTED BY COUMADIN CLINIC (Patient taking differently: Take 2.5-5 mg by mouth daily. Take 1/2 tablet (2.5 mg) on Mon, Wed, and Fri. Then take 1 tablet (5 mg) on Tues, Thurs, Sat, and Sun or as directed by Coumadin Clinic) 90 tablet 1 01/23/2022 at 2100  ? ? ?Inpatient Medications:  ? carvedilol  50 mg Oral BID WC  ? darifenacin  7.5 mg Oral Daily  ? dofetilide  250 mcg Oral BID  ? finasteride  5 mg Oral Daily  ? furosemide  40 mg Oral Daily  ? irbesartan  300 mg Oral Daily  ? potassium chloride  20 mEq Oral Daily  ? potassium chloride  40 mEq Oral Q4H  ? rosuvastatin  20 mg Oral QHS  ? sodium chloride flush  3 mL Intravenous Q12H  ? warfarin  5 mg Oral ONCE-1600  ? Warfarin - Pharmacist Dosing Inpatient   Does not apply [id number]  ? ? ?Allergies: No Known Allergies ? ?Social History  ? ?Socioeconomic History  ? Marital status:  Married  ?  Spouse name: Not on file  ? Number of children: 0  ? Years of education: Not on file  ? Highest education level: Not on file  ?Occupational History  ? Not on file  ?Tobacco Use  ? Smoking status: Never  ? Smokeless tobacco: Never  ?Vaping Use  ? Vaping Use: Never used  ?Substance and Sexual Activity  ? Alcohol use: Yes  ?  Comment: occasionally wine  ? Drug use: No  ? Sexual activity: Not on file  ?Other Topics Concern  ?  Not on file  ?Social History Narrative  ? 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  ? Right Handed   ? Lives in a two story home  ? ?Social Determinants of Health  ? ?Financial Resource Strain: Not on file  ?Food Insecurity: Not on file  ?Transportation Needs: Not on file  ?Physical Activity: Not on file  ?Stress: Not on file  ?Social Connections: Not on file  ?Intimate Partner Violence: Not on file  ?  ? ?Family History  ?Problem Relation Age of Onset  ? Hypertension Mother   ? Heart attack Father 57  ?     deceased from heart attack  ? Hypertension Brother   ? Colon cancer Neg Hx   ?  ? ?Review of Systems: ?All other systems reviewed and are otherwise negative except as noted above. ? ?Physical Exam: ?Vitals:  ? 01/24/22 2200 01/25/22 0002 01/25/22 0448 01/25/22 0800  ?BP: (!) 128/109 (!) 130/94 (!) 123/91 110/90  ?Pulse: (!) 143 71 70 72  ?Resp: '20 20  20  '$ ?Temp: 98 ?F (36.7 ?C) 98 ?F (36.7 ?C) 98.1 ?F (36.7 ?C) 98.2 ?F (36.8 ?C)  ?TempSrc: Oral Oral Oral Oral  ?SpO2: 95% 96% 95% 95%  ?Weight:      ?Height:      ? ? ?GEN- The patient is well appearing, alert and oriented x 3 today.   ?HEENT: normocephalic, atraumatic; sclera clear, conjunctiva pink; hearing intact; oropharynx clear; neck supple ?Lungs- Clear to ausculation bilaterally, normal work of breathing.  No wheezes, rales, rhonchi ?Heart- Regular rate and rhythm, no murmurs, rubs or gallops ?GI- soft, non-tender, non-distended, bowel sounds present ?Extremities- no clubbing, cyanosis, or edema; DP/PT/radial pulses 2+  bilaterally ?MS- no significant deformity or atrophy ?Skin- warm and dry, no rash or lesion ?Psych- euthymic mood, full affect ?Neuro- strength and sensation are intact ? ?Labs: ?  ?Lab Results  ?Component Value

## 2022-01-25 NOTE — Progress Notes (Signed)
Pharmacy: Dofetilide (Tikosyn) - Follow Up ?Assessment and Electrolyte Replacement ? ?Pharmacy consulted to assist in monitoring and replacing electrolytes in this 71 y.o. male admitted on 01/24/2022 undergoing dofetilide initiation. First dofetilide dose: 01/24/22. ? ?Labs: ?   ?Component Value Date/Time  ? K 3.4 (L) 01/25/2022 0867  ? MG 2.2 01/25/2022 0412  ?  ? ?Plan: ?Potassium: ?K < 3.5:  Give KCl 40 mEq q4hr x2  ? ?Magnesium: ?Mg > 2: No additional supplementation needed ? ? ? ?Thank you for allowing pharmacy to participate in this patient's care  ? ?Arrie Senate, PharmD, BCPS, BCCP ?Clinical Pharmacist ?[phone] ?Please check AMION for all [place] numbers ?01/25/2022 ? ? ?

## 2022-01-25 NOTE — Care Management (Signed)
?  Transition of Care (TOC) Screening Note ? ? ?Patient Details  ?Name: Alex Maddox ?Date of Birth: 02-22-1951 ? ? ?Transition of Care (TOC) CM/SW Contact:    ?Graves-Bigelow, Ocie Cornfield, RN ?Phone Number: ?01/25/2022, 12:45 PM ? ? ? ?Transition of Care Department [facility]) has reviewed the patient. Patient presented for Tikosyn Load. Benefits check submitted and Case Manager will follow for cost and pharmacies of choice as the patient progresses.  ?

## 2022-01-25 NOTE — TOC Benefit Eligibility Note (Signed)
Patient Advocate Encounter ? ?Insurance verification completed.   ? ?The patient is currently admitted and upon discharge could be taking dofetilide (Tikosyn) 500 mcg. ? ?The current 30 day co-pay is, $47.00.  ? ?The patient is insured through [employer] Part D  ? ? ? ?Lyndel Safe, CPhT ?Pharmacy Patient Advocate Specialist ?[place] Patient Advocate Team ?Direct Number: [phone]  Fax: [phone] ? ? ? ? ? ?  ?

## 2022-01-25 NOTE — Progress Notes (Signed)
?  Echocardiogram ?2D Echocardiogram has been performed. ? Alex Maddox ?01/25/2022, 12:22 PM ?

## 2022-01-25 NOTE — Plan of Care (Signed)

## 2022-01-25 NOTE — Progress Notes (Signed)
Noted patient on telemetry AVP 80>NSR 72; obtaining EKG now for confirmation and QTC monitoring.  Pt has also voided another 475 ml.  Less dyspnea noted and sats 97% on room air.  Will continue to monitor closely. ?

## 2022-01-26 ENCOUNTER — Other Ambulatory Visit: Payer: Self-pay

## 2022-01-26 DIAGNOSIS — I483 Typical atrial flutter: Secondary | ICD-10-CM | POA: Diagnosis not present

## 2022-01-26 LAB — BASIC METABOLIC PANEL
Anion gap: 5 (ref 5–15)
BUN: 22 mg/dL (ref 8–23)
CO2: 24 mmol/L (ref 22–32)
Calcium: 9 mg/dL (ref 8.9–10.3)
Chloride: 108 mmol/L (ref 98–111)
Creatinine, Ser: 1.32 mg/dL — ABNORMAL HIGH (ref 0.61–1.24)
GFR, Estimated: 58 mL/min — ABNORMAL LOW (ref >60)
Glucose, Bld: 94 mg/dL (ref 70–99)
Potassium: 4.1 mmol/L (ref 3.5–5.1)
Sodium: 137 mmol/L (ref 135–145)

## 2022-01-26 LAB — MAGNESIUM: Magnesium: 2 mg/dL (ref 1.7–2.4)

## 2022-01-26 LAB — PROTIME-INR
INR: 3.2 — ABNORMAL HIGH (ref 0.8–1.2)
Prothrombin Time: 32.3 seconds — ABNORMAL HIGH (ref 11.4–15.2)

## 2022-01-26 MED ORDER — WARFARIN SODIUM 2 MG PO TABS
2.0000 mg | ORAL_TABLET | Freq: Once | ORAL | Status: AC
  Administered 2022-01-26: 2 mg via ORAL
  Filled 2022-01-26: qty 1

## 2022-01-26 NOTE — Progress Notes (Addendum)
? ?Electrophysiology Rounding Note ? ?Patient Name: Alex Maddox ?Date of Encounter: 01/26/2022 ? ?Primary Cardiologist: Sanda Klein, MD  ?Electrophysiologist: None  ? ? ?Subjective  ? ?Pt  remains in NSR  on Tikosyn 250 mcg BID  ? ?QTc from EKG last pm shows  borderline QTc  at ~480-490 when corrected for QRS.  ? ?The patient is doing well today.  At this time, the patient denies chest pain, shortness of breath, or any new concerns. ? ?Inpatient Medications  ?  ?Scheduled Meds: ? carvedilol  50 mg Oral BID WC  ? darifenacin  7.5 mg Oral Daily  ? dofetilide  250 mcg Oral BID  ? finasteride  5 mg Oral Daily  ? furosemide  40 mg Oral Daily  ? irbesartan  300 mg Oral Daily  ? potassium chloride  20 mEq Oral Daily  ? rosuvastatin  20 mg Oral QHS  ? sodium chloride flush  3 mL Intravenous Q12H  ? warfarin  2 mg Oral ONCE-1600  ? Warfarin - Pharmacist Dosing Inpatient   Does not apply [id number]  ? ?Continuous Infusions: ? sodium chloride    ? ?PRN Meds: ?sodium chloride, sodium chloride flush  ? ?Vital Signs  ?  ?Vitals:  ? 01/25/22 1706 01/25/22 1953 01/26/22 0005 01/26/22 0517  ?BP: 118/84 125/87 109/67 104/62  ?Pulse: 65 65 66 64  ?Resp:  20 17 (!) 22  ?Temp:  98.1 ?F (36.7 ?C) 98.1 ?F (36.7 ?C) 98.1 ?F (36.7 ?C)  ?TempSrc:  Oral Oral Oral  ?SpO2: 96% 100%    ?Weight:    93.4 kg  ?Height:      ? ? ?Intake/Output Summary (Last 24 hours) at 01/26/2022 0717 ?Last data filed at 01/25/2022 2030 ?Gross per 24 hour  ?Intake 480 ml  ?Output 555 ml  ?Net -75 ml  ? ?Filed Weights  ? 01/24/22 1751 01/25/22 1117 01/26/22 0517  ?Weight: 95.3 kg 91.8 kg 93.4 kg  ? ? ?Physical Exam  ?  ?GEN- The patient is well appearing, alert and oriented x 3 today.   ?Head- normocephalic, atraumatic ?Eyes-  Sclera clear, conjunctiva pink ?Ears- hearing intact ?Oropharynx- clear ?Neck- supple ?Lungs- Clear to ausculation bilaterally, normal work of breathing ?Heart- Regular rate and rhythm, no murmurs, rubs or gallops ?GI- soft, NT, ND, +  BS ?Extremities- no clubbing, cyanosis, or edema ?Skin- no rash or lesion ?Psych- euthymic mood, full affect ?Neuro- strength and sensation are intact ? ?Labs  ?  ?CBC ?No results for input(s): WBC, NEUTROABS, HGB, HCT, MCV, PLT in the last 72 hours. ?Basic Metabolic Panel ?Recent Labs  ?  01/25/22 ?0412 01/26/22 ?0254  ?NA 138 137  ?K 3.4* 4.1  ?CL 103 108  ?CO2 25 24  ?GLUCOSE 125* 94  ?BUN 23 22  ?CREATININE 1.59* 1.32*  ?CALCIUM 9.1 9.0  ?MG 2.2 2.0  ? ? ?Potassium  ?Date/Time Value Ref Range Status  ?01/26/2022 02:54 AM 4.1 3.5 - 5.1 mmol/L Final  ?  Comment:  ?  DELTA CHECK NOTED  ? ?Magnesium  ?Date/Time Value Ref Range Status  ?01/26/2022 02:54 AM 2.0 1.7 - 2.4 mg/dL Final  ?  Comment:  ?  Performed at [facility] [address], [place], [place] [address]  ? ? ?Telemetry  ?  ?A paced rhythm at 70 (personally reviewed) ? ?Radiology  ?  ?ECHOCARDIOGRAM COMPLETE ? ?Result Date: 01/25/2022 ?   ECHOCARDIOGRAM REPORT   Patient Name:   Alex Maddox Date of Exam:  01/25/2022 Medical Rec #:  [id number]     Height:       72.0 in Accession #:    [id number]    Weight:       202.4 lb Date of Birth:  [birth year]     BSA:          2.141 m? Patient Age:    71 years      BP:           119/83 mmHg Patient Gender: M             HR:           67 bpm. Exam Location:  Inpatient Procedure: 2D Echo, 3D Echo, Strain Analysis, Color Doppler and Cardiac Doppler Indications:    Atrial Flutter I48.92  History:        Patient has prior history of Echocardiogram examinations, most                 recent 08/14/2014. Risk Factors:Sleep Apnea. Thoracic and                 abdominal aortic aneurysm, neurally mediated syncope,                 tachycardia-bradycardia syndrome and pacemaker, atrial                 fibrillation and atrial flutter.  Sonographer:    Darlina Sicilian RDCS Referring Phys: [place]  1. Left ventricular ejection fraction by 3D volume is 45 %. The left ventricle has normal function. The left  ventricle demonstrates global hypokinesis. Indeterminate diastolic filling due to E-A fusion. The average left ventricular global longitudinal strain is -11.9 %. The global longitudinal strain is abnormal.  2. Right ventricular systolic function is mildly reduced. The right ventricular size is mildly enlarged. There is normal pulmonary artery systolic pressure. The estimated right ventricular systolic pressure is 67.3 mmHg.  3. The mitral valve is grossly normal. Mild mitral valve regurgitation. No evidence of mitral stenosis.  4. Tricuspid valve regurgitation is moderate.  5. The aortic valve is tricuspid. There is mild calcification of the aortic valve. There is mild thickening of the aortic valve. Aortic valve regurgitation is trivial. Aortic valve sclerosis is present, with no evidence of aortic valve stenosis.  6. There is mild dilatation of the ascending aorta, measuring 40 mm.  7. The inferior vena cava is dilated in size with >50% respiratory variability, suggesting right atrial pressure of 8 mmHg. FINDINGS  Left Ventricle: Left ventricular ejection fraction by 3D volume is 45 %. The left ventricle has normal function. The left ventricle demonstrates global hypokinesis. The average left ventricular global longitudinal strain is -11.9 %. The global longitudinal strain is abnormal. The left ventricular internal cavity size was normal in size. There is no left ventricular hypertrophy. Indeterminate diastolic filling due to E-A fusion. Right Ventricle: The right ventricular size is mildly enlarged. No increase in right ventricular wall thickness. Right ventricular systolic function is mildly reduced. There is normal pulmonary artery systolic pressure. The tricuspid regurgitant velocity  is 2.30 m/s, and with an assumed right atrial pressure of 8 mmHg, the estimated right ventricular systolic pressure is 41.9 mmHg. Left Atrium: Left atrial size was normal in size. Right Atrium: Right atrial size was normal in  size. Pericardium: Trivial pericardial effusion is present. Mitral Valve: The mitral valve is grossly normal. Mild mitral valve regurgitation. No evidence of mitral valve stenosis. Tricuspid Valve: The tricuspid valve  is grossly normal. Tricuspid valve regurgitation is moderate . No evidence of tricuspid stenosis. Aortic Valve: The aortic valve is tricuspid. There is mild calcification of the aortic valve. There is mild thickening of the aortic valve. Aortic valve regurgitation is trivial. Aortic valve sclerosis is present, with no evidence of aortic valve stenosis. Pulmonic Valve: The pulmonic valve was grossly normal. Pulmonic valve regurgitation is trivial. No evidence of pulmonic stenosis. Aorta: The aortic root is normal in size and structure. There is mild dilatation of the ascending aorta, measuring 40 mm. Venous: The inferior vena cava is dilated in size with greater than 50% respiratory variability, suggesting right atrial pressure of 8 mmHg. IAS/Shunts: The atrial septum is grossly normal. Additional Comments: A device lead is visualized in the right ventricle and right atrium.  LEFT VENTRICLE PLAX 2D LVIDd:         4.40 cm         Diastology LVIDs:         3.00 cm         LV e' medial:    5.74 cm/s LV PW:         1.00 cm         LV E/e' medial:  15.7 LV IVS:        1.10 cm         LV e' lateral:   7.28 cm/s LVOT diam:     2.30 cm         LV E/e' lateral: 12.4 LV SV:         44 LV SV Index:   21              2D LVOT Area:     4.15 cm?        Longitudinal                                Strain                                2D Strain GLS  -11.9 %                                Avg:                                 3D Volume EF                                LV 3D EF:    Left                                             ventricul                                             ar  ejection                                             fraction                                              by 3D                                             volume is                                             45 %.                                 3D Volume EF:                                3D EF:        45 %

## 2022-01-26 NOTE — Progress Notes (Signed)
ANTICOAGULATION CONSULT NOTE ? ?Pharmacy Consult for coumadin ?Indication: atrial fibrillation ? ?No Known Allergies ? ?Patient Measurements: ?Height: 6' (182.9 cm) ?Weight: 93.4 kg (206 lb) ?IBW/kg (Calculated) : 77.6 ?Heparin Dosing Weight:  ? ?Vital Signs: ?Temp: 98.1 ?F (36.7 ?C) (04/19 0517) ?Temp Source: Oral (04/19 0517) ?BP: 104/62 (04/19 0517) ?Pulse Rate: 64 (04/19 0517) ? ?Labs: ?Recent Labs  ?  01/24/22 ?1427 01/25/22 ?0412 01/25/22 ?0718 01/26/22 ?0254  ?LABPROT 31.8*  --  30.8* 32.3*  ?INR 3.1*  --  3.0* 3.2*  ?CREATININE 1.54* 1.59*  --  1.32*  ? ? ? ?Estimated Creatinine Clearance: 62.7 mL/min (A) (by C-G formula based on SCr of 1.32 mg/dL (H)). ? ? ?Medical History: ?Past Medical History:  ?Diagnosis Date  ? Abnormal echocardiogram   ? stress myoview 10/29/2010  ? Arthritis   ? " IN MY BACK "  ? Dyslipidemia   ? Dysrhythmia   ? Atrial fibrillation  ? Hypertension   ? Paroxysmal atrial fibrillation (HCC)   ? Presence of permanent cardiac pacemaker   ? Sinus node dysfunction (HCC)   ? Sleep apnea, obstructive   ? sleep study 10/09/2007  AHI 5.73/hr  REM AHI 11.90/hr   ? 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 LEFT THUMB   ? Stroke [facility])   ? Syncope   ? 2D Echocardiogram 09/27/2010 EF between 40 to 45%  ? Tachycardia-bradycardia [facility])   ? Transient ischemic attack (TIA) 06/07  ? ? ?Medications:  ?Medications Prior to Admission  ?Medication Sig Dispense Refill Last Dose  ? carvedilol (COREG) 25 MG tablet Take 2 tablets (50 mg total) by mouth 2 (two) times daily with a meal. 120 tablet 5 01/24/2022 at 0830  ? Cyanocobalamin (B-12 PO) Take 1 capsule by mouth daily.   01/24/2022  ? diazepam (VALIUM) 2 MG tablet Take 2 mg by mouth every 8 (eight) hours as needed for anxiety.    01/24/2022  ? finasteride (PROSCAR) 5 MG tablet Take 5 mg by mouth daily.   01/24/2022  ? Flaxseed Oil OIL Take 1,000 mg by mouth daily.   01/24/2022  ? furosemide (LASIX) 40 MG tablet Take one tablet daily until your weight  reaches 200 lbs or less, then stop. (Patient taking differently: Take 40 mg by mouth daily. Take one tablet daily until your weight reaches 200 lbs or less, then stop.) 30 tablet 0 01/24/2022  ? irbesartan (AVAPRO) 300 MG tablet Take 1 tablet (300 mg total) by mouth daily. 90 tablet 3 01/24/2022  ? Misc Natural Products (NEURIVA) CAPS Take 1 capsule by mouth daily. For memory   01/23/2022  ? Multiple Vitamin (MULTIVITAMIN WITH MINERALS) TABS tablet Take 1 tablet by mouth daily. Adult Multivitamin 50+   01/24/2022  ? Omega-3 Fatty Acids (FISH OIL) 1000 MG CAPS Take 1,000 mg by mouth daily.   01/24/2022  ? Polyethyl Glycol-Propyl Glycol 0.4-0.3 % SOLN Place 1 drop into both eyes 3 (three) times daily as needed (for dry eyes.).   Past Week  ? potassium chloride 20 MEQ TBCR Take one tablet daily with your furosemide until your weight reaches 200 lbs or less, then stop. (Patient taking differently: Take 20 mEq by mouth daily. Take one tablet daily with your furosemide until your weight reaches 200 lbs or less, then stop.) 30 tablet 0 01/24/2022  ? rosuvastatin (CRESTOR) 20 MG tablet TAKE ONE TABLET ('20MG'$  TOTAL) BY MOUTH DAILY (Patient taking differently: Take 20 mg by mouth daily.) 90 tablet 3 01/23/2022  ?  Saw Palmetto 450 MG CAPS Take 900 mg by mouth daily.   01/24/2022  ? trospium (SANCTURA) 20 MG tablet Take 20 mg by mouth daily.   01/23/2022  ? warfarin (COUMADIN) 5 MG tablet TAKE ONE TABLET BY MOUTH EVERY DAY AS DIRECTED BY COUMADIN CLINIC (Patient taking differently: Take 2.5-5 mg by mouth daily. Take 1/2 tablet (2.5 mg) on Mon, Wed, and Fri. Then take 1 tablet (5 mg) on Tues, Thurs, Sat, and Sun or as directed by Coumadin Clinic) 90 tablet 1 01/23/2022 at 2100  ? ?Scheduled:  ? carvedilol  50 mg Oral BID WC  ? darifenacin  7.5 mg Oral Daily  ? dofetilide  250 mcg Oral BID  ? finasteride  5 mg Oral Daily  ? furosemide  40 mg Oral Daily  ? irbesartan  300 mg Oral Daily  ? potassium chloride  20 mEq Oral Daily  ? rosuvastatin   20 mg Oral QHS  ? sodium chloride flush  3 mL Intravenous Q12H  ? Warfarin - Pharmacist Dosing Inpatient   Does not apply [id number]  ? ?Infusions:  ? sodium chloride    ? ? ?Assessment: ?Pt with a hx of PAF on Coumadin who is being admitted for Tikosyn initiation. INR is 3.2 - slightly supratherapeutic. Home dose 2.'5mg'$  MWF, '5mg'$  AODs.  ? ?Goal of Therapy:  ?INR 2-3 ?Monitor platelets by anticoagulation protocol: Yes ?  ?Plan:  ?Coumadin '2mg'$  x1 - reduced dose ?Daily INR ? ?Arrie Senate, PharmD, BCPS, BCCP ?Clinical Pharmacist ?[phone] ?Please check AMION for all [place] numbers ?01/26/2022 ? ? ? ? ?

## 2022-01-26 NOTE — Care Management (Signed)
01-26-22 1245 Case Manager spoke with the patient regarding Tikosyn cost- patient states cost is affordable. Patient wants the initial Rx to be filled via [facility] and Rx refills 30 day supply sent to [facility].  ?

## 2022-01-26 NOTE — Progress Notes (Addendum)
Paged for swelling in legs.  ? ?Pt has bilateral edema, with chronic intermittent history of same.  ? ?Pt also reports soreness behind R knee. Suspect from being sedentary and in bed here at hospital.  ? ?1+ edema 1/3 way to knee.  Pt had his lasix 1 hr prior. Offered to dose again this afternoon; Pt would like to hold off and follow response to home meds and ambulate.  ? ?Alex Maddox" Alex Cater, PA-C  ?01/26/2022   ?

## 2022-01-26 NOTE — Telephone Encounter (Signed)
Called the patient to see if I can help with his CPAP and breathing issues. He states that he is currently in the hospital for "fluid build up." They advised against using it while he is in the hospital. I told him to restart the CPAP therapy once he returns home if approval is given by his doctor. If he has any problems once he restarts it, to call the office. Patient and wife voiced understanding via speaker phone.

## 2022-01-26 NOTE — Progress Notes (Addendum)
Morning EKG reviewed   ? ?Shows remains in NSR at 60 bpm with borderline QTc at ~480 ms as reviewed by Dr. Lovena Le ? ?Continue  Tikosyn 250 mcg BID.  ? ?Plan for home tomorrow if QTc remains stable.  ? ?Shirley Friar, PA-C  ?Pager: [phone]  ?01/26/2022 10:53 AM  ? ?

## 2022-01-26 NOTE — Progress Notes (Signed)
Pharmacy: Dofetilide (Tikosyn) - Follow Up ?Assessment and Electrolyte Replacement ? ?Pharmacy consulted to assist in monitoring and replacing electrolytes in this 71 y.o. male admitted on 01/24/2022 undergoing dofetilide initiation. First dofetilide dose: 01/24/22. ? ?Labs: ?   ?Component Value Date/Time  ? K 4.1 01/26/2022 0254  ? MG 2.0 01/26/2022 0254  ?  ? ?Plan: ?Potassium: ?K >/= 4: No additional supplementation needed ? ?Magnesium: ?Mg > 2: No additional supplementation needed ? ? ? ?Thank you for allowing pharmacy to participate in this patient's care  ? ?Arrie Senate, PharmD, BCPS, BCCP ?Clinical Pharmacist ?[phone] ?Please check AMION for all [facility] numbers ?01/26/2022 ? ? ?

## 2022-01-27 ENCOUNTER — Other Ambulatory Visit: Payer: Self-pay

## 2022-01-27 ENCOUNTER — Other Ambulatory Visit (HOSPITAL_COMMUNITY): Payer: Self-pay

## 2022-01-27 LAB — BASIC METABOLIC PANEL
Anion gap: 4 — ABNORMAL LOW (ref 5–15)
BUN: 22 mg/dL (ref 8–23)
CO2: 25 mmol/L (ref 22–32)
Calcium: 8.9 mg/dL (ref 8.9–10.3)
Chloride: 106 mmol/L (ref 98–111)
Creatinine, Ser: 1.55 mg/dL — ABNORMAL HIGH (ref 0.61–1.24)
GFR, Estimated: 48 mL/min — ABNORMAL LOW (ref >60)
Glucose, Bld: 95 mg/dL (ref 70–99)
Potassium: 3.8 mmol/L (ref 3.5–5.1)
Sodium: 135 mmol/L (ref 135–145)

## 2022-01-27 LAB — PROTIME-INR
INR: 3.3 — ABNORMAL HIGH (ref 0.8–1.2)
Prothrombin Time: 33 seconds — ABNORMAL HIGH (ref 11.4–15.2)

## 2022-01-27 LAB — MAGNESIUM: Magnesium: 2 mg/dL (ref 1.7–2.4)

## 2022-01-27 MED ORDER — WARFARIN SODIUM 2.5 MG PO TABS: 2.5000 mg | ORAL_TABLET | Freq: Once | ORAL | Status: DC

## 2022-01-27 MED ORDER — POTASSIUM CHLORIDE CRYS ER 20 MEQ PO TBCR
40.0000 meq | EXTENDED_RELEASE_TABLET | Freq: Once | ORAL | Status: AC
  Administered 2022-01-27: 40 meq via ORAL
  Filled 2022-01-27: qty 2

## 2022-01-27 MED ORDER — MAGNESIUM SULFATE 2 GM/50ML IV SOLN
2.0000 g | Freq: Once | INTRAVENOUS | Status: AC
  Administered 2022-01-27: 2 g via INTRAVENOUS
  Filled 2022-01-27: qty 50

## 2022-01-27 MED ORDER — DOFETILIDE 250 MCG PO CAPS
250.0000 ug | ORAL_CAPSULE | Freq: Two times a day (BID) | ORAL | 6 refills | Status: DC
  Filled 2022-01-27: qty 60, 30d supply, fill #0

## 2022-01-27 NOTE — Progress Notes (Signed)
Paged for wide complex rhythm on tele.  ? ?Pt appears by tele to be having brief runs of atrial flutter resulting in V pacing with variable rates.   ? ?Device interrogation confirms same.  ? ?Reviewed with Dr. Lovena Le and pt remains stable for discharge.  ? ? ?Legrand Como [address] Chalmers Cater, PA-C  ?01/27/2022 1:57 PM  ?

## 2022-01-27 NOTE — Plan of Care (Signed)

## 2022-01-27 NOTE — Progress Notes (Signed)
Pharmacy: Dofetilide (Tikosyn) - Follow Up ?Assessment and Electrolyte Replacement ? ?Pharmacy consulted to assist in monitoring and replacing electrolytes in this 71 y.o. male admitted on 01/24/2022 undergoing dofetilide initiation. First dofetilide dose: 01/24/22. ? ?Labs: ?   ?Component Value Date/Time  ? K 3.8 01/27/2022 0444  ? MG 2.0 01/27/2022 0444  ?  ? ?Plan: ?Potassium: ?K 3.8-3.9:  Give KCl 40 mEq po x1  ? ?Magnesium: ?Mg 1.8-2: Give Mg 2 gm IV x1  ? ? ? ?Thank you for allowing pharmacy to participate in this patient's care  ? ?Arrie Senate, PharmD, BCPS, BCCP ?Clinical Pharmacist ?[phone] ?Please check AMION for all [facility] numbers ?01/27/2022 ? ? ?

## 2022-01-27 NOTE — Discharge Summary (Addendum)
? ? ? ?ELECTROPHYSIOLOGY PROCEDURE DISCHARGE SUMMARY  ? ? ?Patient ID: Alex Maddox,  ?MRN: [id number], DOB/AGE: 02-09-1951 71 y.o. ? ?Admit date: 01/24/2022 ?Discharge date: 01/27/2022 ? ?Primary Care Physician: Sharilyn Sites, MD  ?Primary Cardiologist: Sanda Klein, MD  ?Electrophysiologist: None  ? ?Primary Discharge Diagnosis:  ?1.Paroxysmal atrial fibrillation status post Tikosyn loading this admission ? ?Secondary Discharge Diagnosis:  ?2. Chronic systolic CHF ? ?No Known Allergies ? ? ?Procedures This Admission:  ?1.  Tikosyn loading ? ?Brief HPI: ?Alex Maddox is a 71 y.o. male with a past medical history as noted above.  They were referred to EP in the outpatient setting for treatment options of atrial fibrillation.  Risks, benefits, and alternatives to Tikosyn were reviewed with the patient who wished to proceed.   ? ?Hospital Course:  ?The patient was admitted and Tikosyn was initiated.  Renal function and electrolytes were followed during the hospitalization.  Their QTc remained stable.  He converted after medicine dosing and did not require cardioversion. They were monitored until discharge on telemetry which demonstrated NSR.  On the day of discharge, they were examined by Dr. Lovena Le  who considered them stable for discharge to home.  Follow-up has been arranged with the Atrial Fibrillation clinic in approximately 1 week and with  EP APP   in 4 weeks.  ? ?Physical Exam: ?Vitals:  ? 01/27/22 0022 01/27/22 0422 01/27/22 9470 01/27/22 9628  ?BP: 129/77 132/89 126/78 121/72  ?Pulse: 68 66 68   ?Resp: '15 19 18   '$ ?Temp: 97.9 ?F (36.6 ?C) 97.9 ?F (36.6 ?C) 97.8 ?F (36.6 ?C)   ?TempSrc: Oral Oral Oral   ?SpO2: 100% 100% 95%   ?Weight:  94.1 kg    ?Height:      ? ? ?GEN- The patient is well appearing, alert and oriented x 3 today.   ?HEENT: normocephalic, atraumatic; sclera clear, conjunctiva pink; hearing intact; oropharynx clear; neck supple, no JVP ?Lymph- no cervical lymphadenopathy ?Lungs- Clear to  ausculation bilaterally, normal work of breathing.  No wheezes, rales, rhonchi ?Heart- Regular rate and rhythm, no murmurs, rubs or gallops, PMI not laterally displaced ?GI- soft, non-tender, non-distended, bowel sounds present, no hepatosplenomegaly ?Extremities- no clubbing, cyanosis, or edema; DP/PT/radial pulses 2+ bilaterally ?MS- no significant deformity or atrophy ?Skin- warm and dry, no rash or lesion ?Psych- euthymic mood, full affect ?Neuro- strength and sensation are intact ? ? ?Labs: ?  ?Lab Results  ?Component Value Date  ? WBC 6.3 12/24/2020  ? HGB 15.6 12/24/2020  ? HCT 45.7 12/24/2020  ? MCV 93 12/24/2020  ? PLT 133 (L) 12/24/2020  ?  ?Recent Labs  ?Lab 01/27/22 ?3662  ?NA 135  ?K 3.8  ?CL 106  ?CO2 25  ?BUN 22  ?CREATININE 1.55*  ?CALCIUM 8.9  ?GLUCOSE 95  ? ? ? ?Discharge Medications:  ?Allergies as of 01/27/2022   ?No Known Allergies ?  ? ?  ?Medication List  ?  ? ?TAKE these medications   ? ?B-12 PO ?Take 1 capsule by mouth daily. ?  ?carvedilol 25 MG tablet ?Commonly known as: COREG ?Take 2 tablets (50 mg total) by mouth 2 (two) times daily with a meal. ?  ?diazepam 2 MG tablet ?Commonly known as: VALIUM ?Take 2 mg by mouth every 8 (eight) hours as needed for anxiety. ?  ?dofetilide 250 MCG capsule ?Commonly known as: TIKOSYN ?Take 1 capsule (250 mcg total) by mouth 2 (two) times daily. ?  ?finasteride 5 MG tablet ?Commonly known  as: PROSCAR ?Take 5 mg by mouth daily. ?  ?Fish Oil 1000 MG Caps ?Take 1,000 mg by mouth daily. ?  ?Flaxseed Oil Oil ?Take 1,000 mg by mouth daily. ?  ?furosemide 40 MG tablet ?Commonly known as: LASIX ?Take one tablet daily until your weight reaches 200 lbs or less, then stop. ?What changed:  ?how much to take ?how to take this ?when to take this ?  ?irbesartan 300 MG tablet ?Commonly known as: AVAPRO ?Take 1 tablet (300 mg total) by mouth daily. ?  ?multivitamin with minerals Tabs tablet ?Take 1 tablet by mouth daily. Adult Multivitamin 50+ ?  ?Neuriva Caps ?Take 1  capsule by mouth daily. For memory ?  ?Polyethyl Glycol-Propyl Glycol 0.4-0.3 % Soln ?Place 1 drop into both eyes 3 (three) times daily as needed (for dry eyes.). ?  ?Potassium Chloride ER 20 MEQ Tbcr ?Take one tablet daily with your furosemide until your weight reaches 200 lbs or less, then stop. ?What changed:  ?how much to take ?how to take this ?when to take this ?  ?rosuvastatin 20 MG tablet ?Commonly known as: CRESTOR ?TAKE ONE TABLET ('20MG'$  TOTAL) BY MOUTH DAILY ?What changed: See the new instructions. ?  ?Saw Palmetto [address] [place] ?Take 900 mg by mouth daily. ?  ?trospium 20 MG tablet ?Commonly known as: SANCTURA ?Take 20 mg by mouth daily. ?  ?warfarin 5 MG tablet ?Commonly known as: COUMADIN ?Take as directed. If you are unsure how to take this medication, talk to your nurse or doctor. ?Original instructions: TAKE ONE TABLET BY MOUTH EVERY DAY AS DIRECTED BY COUMADIN CLINIC ?What changed: See the new instructions. ?  ? ?  ? ? ?Disposition:  ? ? Follow-up Information   ? ? [facility] Follow up.   ?Specialty: Cardiology ?Why: on 4/27 at 3 pm for post tikosyn follow up ?Contact information: ?[address] ?[id number] mc ?[facility] ?[phone] ? ?  ?  ? ?  ?  ? ?  ? ? ?Duration of Discharge Encounter: Greater than 30 minutes including physician time. ? ?Signed, ?Shirley Friar, PA-C  ?01/27/2022 ?11:54 AM ? ?EP Attending ? ?Patient seen and examined. Agree with above. The patient is doing well though has had a little bit of breakthru atrial fib/flutter. His QT is acceptable. His electrolytes are stable. He will be discharged home with the usual followup. ? ?Salome Spotted ? ? ? ?

## 2022-01-27 NOTE — Progress Notes (Signed)
EKG from yesterday evening 01/26/2022 reviewed   ? ?Shows remains in NSR at with stable QTc at ~480 ms when measured manually ? ?Continue  Tikosyn 250 mcg BID.  ? ?K 3.8, Mg 2.0 Supp ordered for both.  ? ?Plan on home this afternoon if QTc remains stable.    ? ?Shirley Friar, PA-C  ?Pager: [phone]  ?01/27/2022 7:38 AM  ? ?

## 2022-01-27 NOTE — Progress Notes (Signed)
ANTICOAGULATION CONSULT NOTE ? ?Pharmacy Consult for coumadin ?Indication: atrial fibrillation ? ?No Known Allergies ? ?Patient Measurements: ?Height: 6' (182.9 cm) ?Weight: 93.4 kg (206 lb) ?IBW/kg (Calculated) : 77.6 ?Heparin Dosing Weight:  ? ?Vital Signs: ?Temp: 97.9 ?F (36.6 ?C) (04/20 0422) ?Temp Source: Oral (04/20 0422) ?BP: 132/89 (04/20 0422) ?Pulse Rate: 66 (04/20 0422) ? ?Labs: ?Recent Labs  ?  01/25/22 ?0412 01/25/22 ?0718 01/26/22 ?0254 01/27/22 ?0444  ?LABPROT  --  30.8* 32.3* 33.0*  ?INR  --  3.0* 3.2* 3.3*  ?CREATININE 1.59*  --  1.32* 1.55*  ? ? ? ?Estimated Creatinine Clearance: 53.4 mL/min (A) (by C-G formula based on SCr of 1.55 mg/dL (H)). ? ? ?Medical History: ?Past Medical History:  ?Diagnosis Date  ? Abnormal echocardiogram   ? stress myoview 10/29/2010  ? Arthritis   ? " IN MY BACK "  ? Dyslipidemia   ? Dysrhythmia   ? Atrial fibrillation  ? Hypertension   ? Paroxysmal atrial fibrillation (HCC)   ? Presence of permanent cardiac pacemaker   ? Sinus node dysfunction (HCC)   ? Sleep apnea, obstructive   ? sleep study 10/09/2007  AHI 5.73/hr  REM AHI 11.90/hr   ? 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 LEFT THUMB   ? Stroke [facility])   ? Syncope   ? 2D Echocardiogram 09/27/2010 EF between 40 to 45%  ? Tachycardia-bradycardia [facility])   ? Transient ischemic attack (TIA) 06/07  ? ? ?Medications:  ?Medications Prior to Admission  ?Medication Sig Dispense Refill Last Dose  ? carvedilol (COREG) 25 MG tablet Take 2 tablets (50 mg total) by mouth 2 (two) times daily with a meal. 120 tablet 5 01/24/2022 at 0830  ? Cyanocobalamin (B-12 PO) Take 1 capsule by mouth daily.   01/24/2022  ? diazepam (VALIUM) 2 MG tablet Take 2 mg by mouth every 8 (eight) hours as needed for anxiety.    01/24/2022  ? finasteride (PROSCAR) 5 MG tablet Take 5 mg by mouth daily.   01/24/2022  ? Flaxseed Oil OIL Take 1,000 mg by mouth daily.   01/24/2022  ? furosemide (LASIX) 40 MG tablet Take one tablet daily until your weight  reaches 200 lbs or less, then stop. (Patient taking differently: Take 40 mg by mouth daily. Take one tablet daily until your weight reaches 200 lbs or less, then stop.) 30 tablet 0 01/24/2022  ? irbesartan (AVAPRO) 300 MG tablet Take 1 tablet (300 mg total) by mouth daily. 90 tablet 3 01/24/2022  ? Misc Natural Products (NEURIVA) CAPS Take 1 capsule by mouth daily. For memory   01/23/2022  ? Multiple Vitamin (MULTIVITAMIN WITH MINERALS) TABS tablet Take 1 tablet by mouth daily. Adult Multivitamin 50+   01/24/2022  ? Omega-3 Fatty Acids (FISH OIL) 1000 MG CAPS Take 1,000 mg by mouth daily.   01/24/2022  ? Polyethyl Glycol-Propyl Glycol 0.4-0.3 % SOLN Place 1 drop into both eyes 3 (three) times daily as needed (for dry eyes.).   Past Week  ? potassium chloride 20 MEQ TBCR Take one tablet daily with your furosemide until your weight reaches 200 lbs or less, then stop. (Patient taking differently: Take 20 mEq by mouth daily. Take one tablet daily with your furosemide until your weight reaches 200 lbs or less, then stop.) 30 tablet 0 01/24/2022  ? rosuvastatin (CRESTOR) 20 MG tablet TAKE ONE TABLET ('20MG'$  TOTAL) BY MOUTH DAILY (Patient taking differently: Take 20 mg by mouth daily.) 90 tablet 3 01/23/2022  ?  Saw Palmetto 450 MG CAPS Take 900 mg by mouth daily.   01/24/2022  ? trospium (SANCTURA) 20 MG tablet Take 20 mg by mouth daily.   01/23/2022  ? warfarin (COUMADIN) 5 MG tablet TAKE ONE TABLET BY MOUTH EVERY DAY AS DIRECTED BY COUMADIN CLINIC (Patient taking differently: Take 2.5-5 mg by mouth daily. Take 1/2 tablet (2.5 mg) on Mon, Wed, and Fri. Then take 1 tablet (5 mg) on Tues, Thurs, Sat, and Sun or as directed by Coumadin Clinic) 90 tablet 1 01/23/2022 at 2100  ? ?Scheduled:  ? carvedilol  50 mg Oral BID WC  ? darifenacin  7.5 mg Oral Daily  ? dofetilide  250 mcg Oral BID  ? finasteride  5 mg Oral Daily  ? furosemide  40 mg Oral Daily  ? irbesartan  300 mg Oral Daily  ? potassium chloride  20 mEq Oral Daily  ? potassium  chloride  40 mEq Oral Once  ? rosuvastatin  20 mg Oral QHS  ? sodium chloride flush  3 mL Intravenous Q12H  ? Warfarin - Pharmacist Dosing Inpatient   Does not apply U2353  ? ?Infusions:  ? sodium chloride    ? magnesium sulfate bolus IVPB    ? ? ?Assessment: ?Pt with a hx of PAF on Coumadin who is being admitted for Tikosyn initiation. Home dose 2.'5mg'$  MWF, '5mg'$  AODs.  ? ?INR has remained slightly above goal this admit. Will give 50% reduced dose tonight. ? ?Goal of Therapy:  ?INR 2-3 ?Monitor platelets by anticoagulation protocol: Yes ?  ?Plan:  ?Coumadin 2.'5mg'$  x1 - reduced dose ?Daily INR ? ?Arrie Senate, PharmD, BCPS, BCCP ?Clinical Pharmacist ?[phone] ?Please check AMION for all [place] numbers ?01/27/2022 ? ? ? ? ?

## 2022-01-27 NOTE — Care Management Important Message (Signed)
Important Message ? ?Patient Details  ?Name: Alex Maddox ?MRN: [id number] ?Date of Birth: 1951-02-11 ? ? ?Medicare Important Message Given:  Yes ? ? ? ? ?Shelda Altes ?01/27/2022, 9:20 AM ?

## 2022-01-27 NOTE — Progress Notes (Signed)
Discharge instructions (including medications) discussed with and copy provided to patient/caregiver 

## 2022-01-31 DIAGNOSIS — I10 Essential (primary) hypertension: Secondary | ICD-10-CM | POA: Diagnosis not present

## 2022-01-31 DIAGNOSIS — I4891 Unspecified atrial fibrillation: Secondary | ICD-10-CM | POA: Diagnosis not present

## 2022-01-31 DIAGNOSIS — M1991 Primary osteoarthritis, unspecified site: Secondary | ICD-10-CM | POA: Diagnosis not present

## 2022-01-31 DIAGNOSIS — Z0001 Encounter for general adult medical examination with abnormal findings: Secondary | ICD-10-CM | POA: Diagnosis not present

## 2022-01-31 DIAGNOSIS — I5033 Acute on chronic diastolic (congestive) heart failure: Secondary | ICD-10-CM | POA: Diagnosis not present

## 2022-01-31 DIAGNOSIS — E559 Vitamin D deficiency, unspecified: Secondary | ICD-10-CM | POA: Diagnosis not present

## 2022-01-31 DIAGNOSIS — I480 Paroxysmal atrial fibrillation: Secondary | ICD-10-CM | POA: Diagnosis not present

## 2022-02-03 ENCOUNTER — Ambulatory Visit (HOSPITAL_COMMUNITY)
Admit: 2022-02-03 | Discharge: 2022-02-03 | Disposition: A | Discharge status: Home / Self Care | Payer: Medicare Other | Source: Ambulatory Visit | Attending: Nurse Practitioner | Admitting: Nurse Practitioner

## 2022-02-03 ENCOUNTER — Encounter (HOSPITAL_COMMUNITY): Payer: Self-pay | Admitting: Nurse Practitioner

## 2022-02-03 VITALS — BP 108/76 | HR 95 | Ht 72.0 in | Wt 197.2 lb

## 2022-02-03 DIAGNOSIS — I110 Hypertensive heart disease with heart failure: Secondary | ICD-10-CM | POA: Diagnosis not present

## 2022-02-03 DIAGNOSIS — Z950 Presence of cardiac pacemaker: Secondary | ICD-10-CM | POA: Insufficient documentation

## 2022-02-03 DIAGNOSIS — I5042 Chronic combined systolic (congestive) and diastolic (congestive) heart failure: Secondary | ICD-10-CM | POA: Diagnosis not present

## 2022-02-03 DIAGNOSIS — I4892 Unspecified atrial flutter: Secondary | ICD-10-CM | POA: Diagnosis not present

## 2022-02-03 DIAGNOSIS — Z7901 Long term (current) use of anticoagulants: Secondary | ICD-10-CM | POA: Insufficient documentation

## 2022-02-03 DIAGNOSIS — I712 Thoracic aortic aneurysm, without rupture, unspecified: Secondary | ICD-10-CM | POA: Insufficient documentation

## 2022-02-03 DIAGNOSIS — Z79899 Other long term (current) drug therapy: Secondary | ICD-10-CM | POA: Insufficient documentation

## 2022-02-03 DIAGNOSIS — G4733 Obstructive sleep apnea (adult) (pediatric): Secondary | ICD-10-CM | POA: Insufficient documentation

## 2022-02-03 DIAGNOSIS — I480 Paroxysmal atrial fibrillation: Secondary | ICD-10-CM | POA: Diagnosis not present

## 2022-02-03 DIAGNOSIS — D6869 Other thrombophilia: Secondary | ICD-10-CM | POA: Diagnosis not present

## 2022-02-03 DIAGNOSIS — I714 Abdominal aortic aneurysm, without rupture, unspecified: Secondary | ICD-10-CM | POA: Diagnosis not present

## 2022-02-03 DIAGNOSIS — I495 Sick sinus syndrome: Secondary | ICD-10-CM | POA: Insufficient documentation

## 2022-02-03 LAB — BASIC METABOLIC PANEL WITH GFR
Anion gap: 8 (ref 5–15)
BUN: 25 mg/dL — ABNORMAL HIGH (ref 8–23)
CO2: 28 mmol/L (ref 22–32)
Calcium: 10 mg/dL (ref 8.9–10.3)
Chloride: 103 mmol/L (ref 98–111)
Creatinine, Ser: 1.27 mg/dL — ABNORMAL HIGH (ref 0.61–1.24)
GFR, Estimated: >60 mL/min
Glucose, Bld: 97 mg/dL (ref 70–99)
Potassium: 3.9 mmol/L (ref 3.5–5.1)
Sodium: 139 mmol/L (ref 135–145)

## 2022-02-03 LAB — MAGNESIUM: Magnesium: 2.5 mg/dL — ABNORMAL HIGH (ref 1.7–2.4)

## 2022-02-03 MED ORDER — DOFETILIDE 250 MCG PO CAPS: 250.0000 ug | ORAL_CAPSULE | Freq: Two times a day (BID) | ORAL | 6 refills | Status: DC

## 2022-02-03 NOTE — Patient Instructions (Signed)
Heart-Healthy Eating Plan ?Heart-healthy meal planning includes: ?Eating less unhealthy fats. ?Eating more healthy fats. ?Making other changes in your diet. ?Talk with your doctor or a diet specialist (dietitian) to create an eating plan that is right for you. ? ?What are tips for following this plan? ?Cooking ?Avoid frying your food. Try to bake, boil, grill, or broil it instead. You can also reduce fat by: ?Removing the skin from poultry. ?Removing all visible fats from meats. ?Steaming vegetables in water or broth. ?Meal planning ? ?At meals, divide your plate into four equal parts: ?Fill one-half of your plate with vegetables and green salads. ?Fill one-fourth of your plate with whole grains. ?Fill one-fourth of your plate with lean protein foods. ?Eat 4-5 servings of vegetables per day. A serving of vegetables is: ?1 cup of raw or cooked vegetables. ?2 cups of raw leafy greens. ?Eat 4-5 servings of fruit per day. A serving of fruit is: ?1 medium whole fruit. ?? cup of dried fruit. ?? cup of fresh, frozen, or canned fruit. ?? cup of 100% fruit juice. ?Eat more foods that have soluble fiber. These are apples, broccoli, carrots, beans, peas, and barley. Try to get 20-30 g of fiber per day. ?Eat 4-5 servings of nuts, legumes, and seeds per week: ?1 serving of dried beans or legumes equals ? cup after being cooked. ?1 serving of nuts is ? cup. ?1 serving of seeds equals 1 tablespoon. ?General information ?Eat more home-cooked food. Eat less restaurant, buffet, and fast food. ?Limit or avoid alcohol. ?Limit foods that are high in starch and sugar. ?Avoid fried foods. ?Lose weight if you are overweight. ?Keep track of how much salt (sodium) you eat. This is important if you have high blood pressure. Ask your doctor to tell you more about this. ?Try to add vegetarian meals each week. ?Fats ?Choose healthy fats. These include olive oil and canola oil, flaxseeds, walnuts, almonds, and seeds. ?Eat more omega-3 fats. These  include salmon, mackerel, sardines, tuna, flaxseed oil, and ground flaxseeds. Try to eat fish at least 2 times each week. ?Check food labels. Avoid foods with trans fats or high amounts of saturated fat. ?Limit saturated fats. ?These are often found in animal products, such as meats, butter, and cream. ?These are also found in plant foods, such as palm oil, palm kernel oil, and coconut oil. ?Avoid foods with partially hydrogenated oils in them. These have trans fats. Examples are stick margarine, some tub margarines, cookies, crackers, and other baked goods. ?What foods can I eat? ?Fruits ?All fresh, canned (in natural juice), or frozen fruits. ?Vegetables ?Fresh or frozen vegetables (raw, steamed, roasted, or grilled). Green salads. ?Grains ?Most grains. Choose whole wheat and whole grains most of the time. Rice and pasta, including brown rice and pastas made with whole wheat. ?Meats and other proteins ?Lean, well-trimmed beef, veal, pork, and lamb. Chicken and [place] without skin. All fish and shellfish. Wild duck, rabbit, pheasant, and venison. Egg whites or low-cholesterol egg substitutes. Dried beans, peas, lentils, and tofu. Seeds and most nuts. ?Dairy ?Low-fat or nonfat cheeses, including ricotta and mozzarella. Skim or 1% milk that is liquid, powdered, or evaporated. Buttermilk that is made with low-fat milk. Nonfat or low-fat yogurt. ?Fats and oils ?Non-hydrogenated (trans-free) margarines. Vegetable oils, including soybean, sesame, sunflower, olive, peanut, safflower, corn, canola, and cottonseed. Salad dressings or mayonnaise made with a vegetable oil. ?Beverages ?Mineral water. Coffee and tea. Diet carbonated beverages. ?Sweets and desserts ?Sherbet, gelatin, and fruit ice. Small amounts  of dark chocolate. ?Limit all sweets and desserts. ?Seasonings and condiments ?All seasonings and condiments. ?The items listed above may not be a complete list of foods and drinks you can eat. Contact a dietitian for  more options. ?What foods should I avoid? ?Fruits ?Canned fruit in heavy syrup. Fruit in cream or butter sauce. Fried fruit. Limit coconut. ?Vegetables ?Vegetables cooked in cheese, cream, or butter sauce. Fried vegetables. ?Grains ?Breads that are made with saturated or trans fats, oils, or whole milk. Croissants. Sweet rolls. Donuts. High-fat crackers, such as cheese crackers. ?Meats and other proteins ?Fatty meats, such as hot dogs, ribs, sausage, bacon, rib-eye roast or steak. High-fat deli meats, such as salami and bologna. Caviar. Domestic duck and goose. Organ meats, such as liver. ?Dairy ?Cream, sour cream, cream cheese, and creamed cottage cheese. Whole-milk cheeses. Whole or 2% milk that is liquid, evaporated, or condensed. Whole buttermilk. Cream sauce or high-fat cheese sauce. Yogurt that is made from whole milk. ?Fats and oils ?Meat fat, or shortening. Cocoa butter, hydrogenated oils, palm oil, coconut oil, palm kernel oil. Solid fats and shortenings, including bacon fat, salt pork, lard, and butter. Nondairy cream substitutes. Salad dressings with cheese or sour cream. ?Beverages ?Regular sodas and juice drinks with added sugar. ?Sweets and desserts ?Frosting. Pudding. Cookies. Cakes. Pies. Milk chocolate or white chocolate. Buttered syrups. Full-fat ice cream or ice cream drinks. ?The items listed above may not be a complete list of foods and drinks to avoid. Contact a dietitian for more information. ?Summary ?Heart-healthy meal planning includes eating less unhealthy fats, eating more healthy fats, and making other changes in your diet. ?Eat a balanced diet. This includes fruits and vegetables, low-fat or nonfat dairy, lean protein, nuts and legumes, whole grains, and heart-healthy oils and fats. ?This information is not intended to replace advice given to you by your health care provider. Make sure you discuss any questions you have with your health care provider. ?Document Revised: 02/04/2021  Document Reviewed: 02/04/2021 ?Elsevier Patient Education ? [place]. ? ?

## 2022-02-03 NOTE — Progress Notes (Signed)
? ? ?Primary Care Physician: Sharilyn Sites, MD ?Primary Cardiologist: Dr Sallyanne Kuster ?Primary Electrophysiologist: none ?Referring Physician: Dr Sallyanne Kuster  ? ? ?Alex Maddox is a 71 y.o. male with a history of OSA, coronary calcifications, CHF, thoracic and abdominal aortic aneurysm, neurally mediated syncope, tachycardia-bradycardia syndrome and pacemaker, atrial fibrillation, atrial flutter who presents for consultation in the [facility]. Patient is on warfarin for a CHADS2VASC score of 4. He was seen by Dr Sallyanne Kuster 01/21/22 in rapid atrial flutter. Overdrive pacing with his device converted him to rate controlled afib. Patient called [facility] with heart rates back up to 140s with some SOB and lower extremity edema.  ? ?Pt is here 4/27 for f/u [facility] admit.ekg shows av paced rhythm. He feels improved. Qt is at 568 ms but is a paced rhythm and is comparable to paced  ekg's in hospital. Last EKG in hospital was manually corrected to 484 ms with  a qt at 560 ms . He has felt more fatigued sine laving hospital as he has been trying to get use to using cpap.  ? ?Today, he denies symptoms of palpitations, chest pain, shortness of breath, orthopnea, PND, lower extremity edema, dizziness, presyncope, syncope, snoring, daytime somnolence, bleeding, or neurologic sequela. The patient is tolerating medications without difficulties and is otherwise without complaint today.  ? ? ?Atrial Fibrillation Risk Factors: ? ?he does have symptoms or diagnosis of sleep apnea. ?he is working on starting CPAP. ?he does not have a history of rheumatic fever. ? ? ?he has a BMI of Body mass index is 28.14 kg/m?Marland KitchenMarland Kitchen ?There were no vitals filed for this visit. ? ? ?Family History  ?Problem Relation Age of Onset  ? Hypertension Mother   ? Heart attack Father 37  ?     deceased from heart attack  ? Hypertension Brother   ? Colon cancer Neg Hx   ? ? ? ?Atrial Fibrillation Management history: ? ?Previous antiarrhythmic  drugs: none ?Previous cardioversions: none ?Previous ablations: none ?CHADS2VASC score: 4 ?Anticoagulation history: warfarin  ? ? ?Past Medical History:  ?Diagnosis Date  ? Abnormal echocardiogram   ? stress myoview 10/29/2010  ? Arthritis   ? " IN MY BACK "  ? Dyslipidemia   ? Dysrhythmia   ? Atrial fibrillation  ? Hypertension   ? Paroxysmal atrial fibrillation (HCC)   ? Presence of permanent cardiac pacemaker   ? Sinus node dysfunction (HCC)   ? Sleep apnea, obstructive   ? sleep study 10/09/2007  AHI 5.73/hr  REM AHI 11.90/hr   ? 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 LEFT THUMB   ? Stroke [facility])   ? Syncope   ? 2D Echocardiogram 09/27/2010 EF between 40 to 45%  ? Tachycardia-bradycardia [facility])   ? Transient ischemic attack (TIA) 06/07  ? ?Past Surgical History:  ?Procedure Laterality Date  ? COLONOSCOPY    ? COLONOSCOPY N/A 06/07/2017  ? Procedure: COLONOSCOPY;  Surgeon: Rogene Houston, MD;  [facility];  Service: Endoscopy;  Laterality: N/A;  9:30  ? COLONOSCOPY N/A 09/20/2018  ? Procedure: COLONOSCOPY;  Surgeon: Rogene Houston, MD;  [facility];  Service: Endoscopy;  Laterality: N/A;  1030  ? ELECTROCARDIOGRAM    ? INSERT / REPLACE / REMOVE PACEMAKER  05/2011  ? LOOP RECORDER IMPLANT  02/02/2011  ? Medtronic- CRM   ? PACEMAKER INSERTION  05/30/2011  ? Medtronic Revo   last checked 11/07/2012  ? POLYPECTOMY  09/20/2018  ?  Procedure: POLYPECTOMY;  Surgeon: Rogene Houston, MD;  Location: [facility];  Service: Endoscopy;;  colon  ? PPM GENERATOR CHANGEOUT N/A 12/30/2020  ? Procedure: PPM GENERATOR CHANGEOUT;  Surgeon: Sanda Klein, MD;  Location: [facility];  Service: Cardiovascular;  Laterality: N/A;  ? ? ?Current Outpatient Medications  ?Medication Sig Dispense Refill  ? carvedilol (COREG) 25 MG tablet Take 2 tablets (50 mg total) by mouth 2 (two) times daily with a meal. 120 tablet 5  ? Cyanocobalamin (B-12 PO) Take 1 capsule by mouth daily.    ? diazepam  (VALIUM) 2 MG tablet Take 2 mg by mouth every 8 (eight) hours as needed for anxiety.     ? dofetilide (TIKOSYN) 250 MCG capsule Take 1 capsule (250 mcg total) by mouth 2 (two) times daily. 60 capsule 6  ? finasteride (PROSCAR) 5 MG tablet Take 5 mg by mouth daily.    ? Flaxseed Oil OIL Take 1,000 mg by mouth daily.    ? furosemide (LASIX) 40 MG tablet Take one tablet daily until your weight reaches 200 lbs or less, then stop. (Patient taking differently: Take 40 mg by mouth daily. Take one tablet daily until your weight reaches 200 lbs or less, then stop.) 30 tablet 0  ? irbesartan (AVAPRO) 300 MG tablet Take 1 tablet (300 mg total) by mouth daily. 90 tablet 3  ? Misc Natural Products (NEURIVA) CAPS Take 1 capsule by mouth daily. For memory    ? Multiple Vitamin (MULTIVITAMIN WITH MINERALS) TABS tablet Take 1 tablet by mouth daily. Adult Multivitamin 50+    ? Omega-3 Fatty Acids (FISH OIL) 1000 MG CAPS Take 1,000 mg by mouth daily.    ? Polyethyl Glycol-Propyl Glycol 0.4-0.3 % SOLN Place 1 drop into both eyes 3 (three) times daily as needed (for dry eyes.).    ? potassium chloride 20 MEQ TBCR Take one tablet daily with your furosemide until your weight reaches 200 lbs or less, then stop. (Patient taking differently: Take 20 mEq by mouth daily. Take one tablet daily with your furosemide until your weight reaches 200 lbs or less, then stop.) 30 tablet 0  ? rosuvastatin (CRESTOR) 20 MG tablet TAKE ONE TABLET ('20MG'$  TOTAL) BY MOUTH DAILY (Patient taking differently: Take 20 mg by mouth daily.) 90 tablet 3  ? Saw Palmetto 450 MG CAPS Take 900 mg by mouth daily.    ? trospium (SANCTURA) 20 MG tablet Take 20 mg by mouth daily.    ? warfarin (COUMADIN) 5 MG tablet TAKE ONE TABLET BY MOUTH EVERY DAY AS DIRECTED BY COUMADIN CLINIC (Patient taking differently: Take 2.5-5 mg by mouth daily. Take 1/2 tablet (2.5 mg) on Mon, Wed, and Fri. Then take 1 tablet (5 mg) on Tues, Thurs, Sat, and Sun or as directed by Coumadin Clinic) 90  tablet 1  ? ?No current facility-administered medications for this encounter.  ? ? ?No Known Allergies ? ?Social History  ? ?Socioeconomic History  ? Marital status: Married  ?  Spouse name: Not on file  ? Number of children: 0  ? Years of education: Not on file  ? Highest education level: Not on file  ?Occupational History  ? Not on file  ?Tobacco Use  ? Smoking status: Never  ? Smokeless tobacco: Never  ?Vaping Use  ? Vaping Use: Never used  ?Substance and Sexual Activity  ? Alcohol use: Yes  ?  Comment: occasionally wine  ? Drug use: No  ? Sexual activity: Not  on file  ?Other Topics Concern  ? Not on file  ?Social History Narrative  ? 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  ? Right Handed   ? Lives in a two story home  ? ?Social Determinants of Health  ? ?Financial Resource Strain: Not on file  ?Food Insecurity: Not on file  ?Transportation Needs: Not on file  ?Physical Activity: Not on file  ?Stress: Not on file  ?Social Connections: Not on file  ?Intimate Partner Violence: Not on file  ? ? ? ?ROS- All systems are reviewed and negative except as per the HPI above. ? ?Physical Exam: ?Vitals:  ? 02/03/22 1433  ?Height: 6' (1.829 m)  ? ? ?GEN- The patient is a well appearing male, alert and oriented x 3 today.   ?Head- normocephalic, atraumatic ?Eyes-  Sclera clear, conjunctiva pink ?Ears- hearing intact ?Oropharynx- clear ?Neck- supple  ?Lungs- Clear to ausculation bilaterally, normal work of breathing ?Heart- irregular rate and rhythm, no murmurs, rubs or gallops  ?GI- soft, NT, ND, + BS ?Extremities- no clubbing, cyanosis, or edema ?MS- no significant deformity or atrophy ?Skin- no rash or lesion ?Psych- euthymic mood, full affect ?Neuro- strength and sensation are intact ? ?Wt Readings from Last 3 Encounters:  ?01/27/22 94.1 kg  ?01/24/22 95.1 kg  ?01/21/22 97 kg  ? ? ?EKG today demonstrates  ?Vent. rate 95 BPM ?PR interval * ms ?QRS duration 130 ms ?QT/QTcB 452/568 ms( pacing contributing, looks  similar to qt's in hospital and corrected to 480 ms ) ?P-R-T axes 33 -71 93 ?AV dual-paced rhythm ?Abnormal ECG ?When compared with ECG of 27-Jan-2022 13:17, ?PREVIOUS ECG IS PRESENT ? ? ?Epic records are reviewed at

## 2022-02-12 DIAGNOSIS — G4733 Obstructive sleep apnea (adult) (pediatric): Secondary | ICD-10-CM | POA: Diagnosis not present

## 2022-02-16 ENCOUNTER — Ambulatory Visit: Payer: Medicare Other | Admitting: Cardiovascular Disease

## 2022-02-16 ENCOUNTER — Encounter: Payer: Self-pay | Admitting: Cardiovascular Disease

## 2022-02-16 VITALS — BP 124/86 | HR 61 | Ht 72.0 in | Wt 196.6 lb

## 2022-02-16 DIAGNOSIS — I10 Essential (primary) hypertension: Secondary | ICD-10-CM

## 2022-02-16 DIAGNOSIS — G4733 Obstructive sleep apnea (adult) (pediatric): Secondary | ICD-10-CM | POA: Diagnosis not present

## 2022-02-16 DIAGNOSIS — G4719 Other hypersomnia: Secondary | ICD-10-CM | POA: Diagnosis not present

## 2022-02-16 DIAGNOSIS — I480 Paroxysmal atrial fibrillation: Secondary | ICD-10-CM

## 2022-02-16 DIAGNOSIS — E785 Hyperlipidemia, unspecified: Secondary | ICD-10-CM

## 2022-02-16 DIAGNOSIS — Z950 Presence of cardiac pacemaker: Secondary | ICD-10-CM | POA: Diagnosis not present

## 2022-02-16 NOTE — Progress Notes (Signed)
Cardiology Office Note    Date:  02/26/2022   ID:  Jontez, Redfield [birth year], MRN [id number]  PCP:  Sharilyn Sites, MD  Cardiologist:  Shelva Majestic, MD (sleep); Dr. Sallyanne Kuster EP: Dr. Lovena Le  New sleep evaluation   History of Present Illness:  Alex Maddox is a 71 y.o. 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  He had a history of TIA at age 65 and had traumatic right occipital intracerebral hemorrhage while on warfarin anticoagulation in November 2016.  He is followed by Dr. Sallyanne Kuster and recently underwent Tikosyn loading by Dr. Lovena Le for his atrial fibrillation.    Due to concerns for obstructive sleep apnea with symptoms of snoring, increasing fatigability, nonrestorative sleep, hypersomnolence, and atrial fibrillation he was referred for a sleep study which was done at [facility] sleep disorder center on October 25, 2021.  He was found to have mild overall sleep apnea with an AHI of 10.9 and RDI of 11.1/h.  Sleep apnea was moderate during REM sleep with an AHI of 22.0 as well as with supine sleep with an AHI of 28.3.  He had significant oxygen desaturation to a nadir of 82% and loud snoring was noted throughout the study.  During the study he was noted to have PVCs.  With his cardiovascular comorbidities he underwent a CPAP titration study on December 21, 2021.  CPAP was initiated at 4 cm and titrated to 10 cm of water with AHI 0 and O2 nadir 93%.  Oxygen desaturated to a nadir of 88% at 7 cm of water.  Was initially recommended that he start CPAP therapy with an auto unit at a range of 9 to 15 cm of water particularly with his positional component.  His CPAP set up date was January 13, 2022 with [employer] as his DME  company.  He received a new ResMed air sense 11 AutoSet CPAP unit with set up date on January 13, 2022.  A download was obtained from April 10 through Feb 15, 2022.  Typically, he has been going to bed between 11 PM and midnight and waking up at 8 AM.  On his initial download, he did not meet compliance standards and had only 17 of 30 days of usage representing 57% and usage greater than 4 hours at only 50%.  Average use was 7 hours and 22 minutes.  At his pressure range of 9 to 15 cm of water AHI is 1.9.  However, the reason the patient was not compliant is because he was hospitalized for several days in April from April 17 through April 20 with his Tikosyn loading.  He has been having some mask issues and has been using an AirFit F10 mask.  He has continued to have significant daytime sleepiness and an Epworth Sleepiness Scale score was read endorsed in the office today and this endorsed at 21 as shown below:  Epworth Sleepiness Scale: Situation   Chance of Dozing/Sleeping (0 = never , 1 = slight chance , 2 = moderate chance , 3 = high chance )   sitting and reading 3   watching TV 3   sitting inactive in a public place 3   being a passenger in a motor vehicle for an hour or more 3   lying down in the afternoon  3   sitting and talking to someone 0   sitting quietly after lunch (no alcohol) 3   while stopped for a few minutes in traffic as the driver 3   Total Score  21    He denies any painful restless legs, bruxism, hypnagogic or hypnopompic hallucinations or cataplectic events.  He presents for his initial sleep evaluation.  Past Medical History:  Diagnosis Date   Abnormal echocardiogram    stress myoview 10/29/2010   Arthritis    " IN MY BACK "   Dyslipidemia    Dysrhythmia    Atrial fibrillation   Hypertension    Paroxysmal atrial fibrillation (HCC)    Presence of permanent cardiac pacemaker    Sinus node dysfunction (HCC)    Sleep apnea, obstructive    sleep study 10/09/2007  AHI  5.73/hr  REM AHI 11.90/hr    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   LEFT THUMB    Stroke [facility])    Syncope    2D Echocardiogram 09/27/2010 EF between 40 to 45%   Tachycardia-bradycardia ([place])    Transient ischemic attack (TIA) 06/07    Past Surgical History:  Procedure Laterality Date   COLONOSCOPY     COLONOSCOPY N/A 06/07/2017   Procedure: COLONOSCOPY;  Surgeon: Rogene Houston, MD;  Location: [facility];  Service: Endoscopy;  Laterality: N/A;  9:30   COLONOSCOPY N/A 09/20/2018   Procedure: COLONOSCOPY;  Surgeon: Rogene Houston, MD;  Location: [facility];  Service: Endoscopy;  Laterality: N/A;  [place] / REPLACE / REMOVE PACEMAKER  05/2011   LOOP RECORDER IMPLANT  02/02/2011   Medtronic- CRM    PACEMAKER INSERTION  05/30/2011   Medtronic Revo   last checked 11/07/2012   POLYPECTOMY  09/20/2018   Procedure: POLYPECTOMY;  Surgeon: Rogene Houston, MD;  Location: [facility];  Service: Endoscopy;;  colon   PPM GENERATOR CHANGEOUT N/A 12/30/2020   Procedure: PPM GENERATOR CHANGEOUT;  Surgeon: Sanda Klein, MD;  Location: [facility] [facility];  Service: Cardiovascular;  Laterality: N/A;    Current Medications: Outpatient Medications Prior to Visit  Medication Sig Dispense Refill   carvedilol (COREG) 25 MG tablet Take 2 tablets (50 mg total) by mouth 2 (two) times daily with a meal. 120 tablet 5   Cyanocobalamin (B-12 PO) Take 1 capsule by mouth daily.     diazepam (VALIUM) 2 MG tablet Take 2 mg by mouth every 8 (eight) hours as needed for anxiety.      dofetilide (TIKOSYN) 250 MCG capsule Take 1 capsule (250 mcg total) by mouth 2 (two) times daily. 60 capsule 6   finasteride (PROSCAR) 5 MG tablet Take 5 mg by mouth daily.     Misc Natural Products (NEURIVA) CAPS Take 1 capsule by mouth daily. For memory     Multiple Vitamin (MULTIVITAMIN WITH MINERALS) TABS tablet Take 1 tablet by mouth daily. Adult Multivitamin 50+     Polyethyl  Glycol-Propyl Glycol 0.4-0.3 % SOLN Place 1 drop into both eyes 3 (three) times daily as needed (for dry eyes.). (Patient not taking: Reported on 02/21/2022)     rosuvastatin (CRESTOR) 20 MG tablet TAKE ONE TABLET ($RemoveBef'20MG'PJMRqXujVg$  TOTAL) BY MOUTH DAILY (Patient taking differently: Take 20 mg by mouth daily.) 90 tablet 3   Saw Palmetto 450 MG CAPS Take 900 mg by mouth daily.     trospium (SANCTURA) 20 MG tablet Take 20 mg by mouth daily.  warfarin (COUMADIN) 5 MG tablet TAKE ONE TABLET BY MOUTH EVERY DAY AS DIRECTED BY COUMADIN CLINIC (Patient taking differently: Take 2.5-5 mg by mouth daily. Take 1/2 tablet (2.5 mg) on Mon, Wed, and Fri. Then take 1 tablet (5 mg) on Tues, Thurs, Sat, and Sun or as directed by Coumadin Clinic) 90 tablet 1   irbesartan (AVAPRO) 300 MG tablet Take 1 tablet (300 mg total) by mouth daily. 90 tablet 3   furosemide (LASIX) 40 MG tablet Take one tablet daily until your weight reaches 200 lbs or less, then stop. (Patient not taking: Reported on 02/16/2022) 30 tablet 0   potassium chloride 20 MEQ TBCR Take one tablet daily with your furosemide until your weight reaches 200 lbs or less, then stop. (Patient not taking: Reported on 02/16/2022) 30 tablet 0   No facility-administered medications prior to visit.     Allergies:   Patient has no known allergies.   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   Right Handed    Lives in a two story home   Social  Determinants of Health   Financial Resource Strain: Not on file  Food Insecurity: Not on file  Transportation Needs: Not on file  Physical Activity: Not on file  Stress: Not on file  Social Connections: Not on file     Family History:  The patient's family history includes Heart attack (age of onset: 70) in his father; Hypertension in his brother and mother.   ROS General: Negative; No fevers, chills, or night sweats;  HEENT: Negative; No changes in vision or hearing, sinus congestion, difficulty swallowing Pulmonary: Negative; No cough, wheezing, shortness of breath, hemoptysis Cardiovascular: History of PAF, status post recent Tikosyn load, status post permanent pacemaker, neurally mediated syncope, GI: Negative; No nausea, vomiting, diarrhea, or abdominal pain GU: Negative; No dysuria, hematuria, or difficulty voiding Musculoskeletal: Negative; no myalgias, joint pain, or weakness Hematologic/Oncology: Negative; no easy bruising, bleeding Endocrine: Negative; no heat/cold intolerance; no diabetes Neuro: History of TIA x2 Skin: Negative; No rashes or skin lesions Psychiatric: Negative; No behavioral problems, depression Sleep: See HPI Other comprehensive 14 point system review is negative.   PHYSICAL EXAM:   VS:  BP 124/86   Pulse 61   Ht 6' (1.829 m)   Wt 196 lb 9.6 oz (89.2 kg)   SpO2 99%   BMI 26.66 kg/m     Repeat blood pressure by me was 122/70  Wt Readings from Last 3 Encounters:  02/21/22 195 lb 3.2 oz (88.5 kg)  02/16/22 196 lb 9.6 oz (89.2 kg)  02/03/22 197 lb 3.2 oz (89.4 kg)    General: Alert, oriented, no distress.  Skin: normal turgor, no rashes, warm and dry HEENT: Normocephalic, atraumatic. Pupils equal round and reactive to light; sclera anicteric; extraocular muscles intact;  Nose without nasal septal hypertrophy  Mouth/Parynx benign; Mallinpatti scale 3 Neck: No JVD, no carotid bruits; normal carotid upstroke Lungs: clear to ausculatation and  percussion; no wheezing or rales Chest wall: without tenderness to palpitation Heart: PMI not displaced, RRR, s1 s2 normal, 1/6 systolic murmur, no diastolic murmur, no rubs, gallops, thrills, or heaves Abdomen: soft, nontender; no hepatosplenomehaly, BS+; abdominal aorta nontender and not dilated by palpation. Back: no CVA tenderness Pulses 2+ Musculoskeletal: full range of motion, normal strength, no joint deformities Extremities: Trace ankle edema,no clubbing cyanosis, Homan's sign negative  Neurologic: grossly nonfocal; Cranial nerves grossly wnl Psychologic: Normal mood and affect   Studies/Labs Reviewed:   ECG (independently read by me): Atrial paced at 61; RBBB  Recent Labs:    Latest Ref Rng & Units 02/03/2022    3:15 PM 01/27/2022    4:44 AM 01/26/2022    2:54 AM  BMP  Glucose 70 - 99 mg/dL 97   95   94    BUN 8 - 23 mg/dL $Remove'25   22   22    'SbAcbYv$ Creatinine 0.61 - 1.24 mg/dL 1.27   1.55   1.32    Sodium 135 - 145 mmol/L 139   135   137    Potassium 3.5 - 5.1 mmol/L 3.9   3.8   4.1    Chloride 98 - 111 mmol/L 103   106   108    CO2 22 - 32 mmol/L $RemoveB'28   25   24    'KlpvyjrY$ Calcium 8.9 - 10.3 mg/dL 10.0   8.9   9.0          Latest Ref Rng & Units 05/04/2021    8:01 AM 08/09/2019    9:12 AM 06/29/2018    5:56 AM  Hepatic Function  Total Protein 6.0 - 8.5 g/dL 6.5   6.8   7.0    Albumin 3.8 - 4.8 g/dL 4.4   4.3   3.8    AST 0 - 40 IU/L $Remov'21   21   25    'XuaNlW$ ALT 0 - 44 IU/L 32   29   36    Alk Phosphatase 44 - 121 IU/L 68   86   48    Total Bilirubin 0.0 - 1.2 mg/dL 0.5   0.7   1.6         Latest Ref Rng & Units 12/24/2020    4:06 PM 07/04/2018    4:40 AM 07/02/2018    5:59 AM  CBC  WBC 3.4 - 10.8 x10E3/uL 6.3   9.3   7.3    Hemoglobin 13.0 - 17.7 g/dL 15.6   15.3   15.3    Hematocrit 37.5 - 51.0 % 45.7   45.0   44.3    Platelets 150 - 450 x10E3/uL 133   150   145     Lab Results  Component Value Date   MCV 93 12/24/2020   MCV 93.6 07/04/2018   MCV 92.1 07/02/2018   Lab Results   Component Value Date   TSH 0.464 08/13/2014   Lab Results  Component Value Date   HGBA1C 6.2 (H) 05/04/2021     BNP No results found for: BNP  ProBNP No results found for: PROBNP   Lipid Panel     Component Value Date/Time   CHOL 123 05/04/2021 0801   TRIG 71 05/04/2021 0801   HDL 37 (L) 05/04/2021 0801   CHOLHDL 3.3 05/04/2021 0801   LDLCALC 71 05/04/2021 0801   LABVLDL  15 05/04/2021 0801     RADIOLOGY: SLEEP STUDY DOCUMENTS  Result Date: 02/24/2022 Ordered by an unspecified provider.    Additional studies/ records that were reviewed today include:   10/25/2021 CLINICAL INFORMATION Sleep Study Type: NPSG   Indication for sleep study: snoring, fatige, AF, non-restorative sleep, hypersomnolence   Epworth Sleepiness Score: 15   SLEEP STUDY TECHNIQUE As per the AASM Manual for the Scoring of Sleep and Associated Events v2.3 (April 2016) with a hypopnea requiring 4% desaturations.   The channels recorded and monitored were frontal, central and occipital EEG, electrooculogram (EOG), submentalis EMG (chin), nasal and oral airflow, thoracic and abdominal wall motion, anterior tibialis EMG, snore microphone, electrocardiogram, and pulse oximetry.   MEDICATIONS carvedilol (COREG) 25 MG tablet diazepam (VALIUM) 2 MG tablet finasteride (PROSCAR) 5 MG tablet Flaxseed Oil OIL irbesartan (AVAPRO) 300 MG tablet Misc Natural Products (NEURIVA) CAPS Multiple Vitamin (MULTIVITAMIN WITH MINERALS) TABS tablet Omega-3 Fatty Acids (FISH OIL) 1000 MG CAPS Polyethyl Glycol-Propyl Glycol 0.4-0.3 % SOLN rosuvastatin (CRESTOR) 20 MG tablet Saw Palmetto 450 MG CAPS trospium (SANCTURA) 20 MG tablet warfarin (COUMADIN) 5 MG tablet  Medications self-administered by patient taken the night of the study : N/A   SLEEP ARCHITECTURE The study was initiated at 10:01:35 PM and ended at 6:04:32 AM.   Sleep onset time was 50.2 minutes and the sleep efficiency was 78.3%. The total sleep  time was 378.2 minutes.   Stage REM latency was 99.0 minutes.   The patient spent 1.72% of the night in stage N1 sleep, 31.65% in stage N2 sleep, 37.81% in stage N3 and 28.8% in REM.   Alpha intrusion was absent.   Supine sleep was 30.30%.   RESPIRATORY PARAMETERS The overall apnea/hypopnea index (AHI) was 10.9 per hour. The respiratory disturbance index (RDI) was 11.1/h. There were 16 total apneas, including 16 obstructive, 0 central and 0 mixed apneas. There were 53 hypopneas and 1 RERAs.   The AHI during Stage REM sleep was 22.0 per hour.   AHI while supine was 28.3 per hour.   The mean oxygen saturation was 94.04%. The minimum SpO2 during sleep was 82.00%.   Loud snoring was noted during this study.   CARDIAC DATA The 2 lead EKG demonstrated sinus rhythm. The mean heart rate was 62.10 beats per minute. Other EKG findings include: PVCs.   LEG MOVEMENT DATA The total PLMS were 1557 with a resulting PLMS index of 247.01. Associated arousal with leg movement index was 3.5 .   IMPRESSIONS - Mild obstructive sleep apnea overall (AHI10.9/h; RDI 11.1/h); however, sleep apnea was moderate during REM sleep (AHI 22.0/h and with supine sleep (AHI 28.3/h). - Moderate oxygen desaturation to a nadir of 82%. - The patient snored with loud snoring volume. - EKG findings include PVCs. - Severe periodic limb movements of sleep occurred during the study. No significant associated arousals.   DIAGNOSIS - Obstructive Sleep Apnea (G47.33)   RECOMMENDATIONS - In this patient with significant cardiovascular comorbidities recommend an in-lab therapeutic CPAP titration to determine optimal pressure required to alleviate sleep disordered breathing. If unable to schedule an in-lab titration, initiate Auto-PAP with EPR of 3 at 6 - 18 cm of water. - Effort should be made to optimize nasal and oropharyngeal patency. - Positional therapy avoiding supine position during sleep. - Avoid alcohol, sedatives  and other CNS depressants that may worsen sleep apnea and disrupt normal sleep architecture. - Sleep hygiene should be reviewed to assess factors that may improve sleep quality. -  Weight management and regular exercise should be initiated or continued if appropriate.    12/21/2021 CLINICAL INFORMATION The patient is referred for a CPAP titration to treat sleep apnea.   Date of NPSG: 10/25/2021:  AHI10.9/h; RDI 11.2/h; REM AHI 22.0/h; Supine AHI 28.3/h.   SLEEP STUDY TECHNIQUE As per the AASM Manual for the Scoring of Sleep and Associated Events v2.3 (April 2016) with a hypopnea requiring 4% desaturations.   The channels recorded and monitored were frontal, central and occipital EEG, electrooculogram (EOG), submentalis EMG (chin), nasal and oral airflow, thoracic and abdominal wall motion, anterior tibialis EMG, snore microphone, electrocardiogram, and pulse oximetry. Continuous positive airway pressure (CPAP) was initiated at the beginning of the study and titrated to treat sleep-disordered breathing.   MEDICATIONS carvedilol (COREG) 25 MG tablet diazepam (VALIUM) 2 MG tablet finasteride (PROSCAR) 5 MG tablet Flaxseed Oil OIL irbesartan (AVAPRO) 300 MG tablet Misc Natural Products (NEURIVA) CAPS Multiple Vitamin (MULTIVITAMIN WITH MINERALS) TABS tablet Omega-3 Fatty Acids (FISH OIL) 1000 MG CAPS Polyethyl Glycol-Propyl Glycol 0.4-0.3 % SOLN rosuvastatin (CRESTOR) 20 MG tablet Saw Palmetto 450 MG CAPS trospium (SANCTURA) 20 MG tablet warfarin (COUMADIN) 5 MG tablet  Medications self-administered by patient taken the night of the study : N/A   TECHNICIAN COMMENTS Comments added by technician: CPAP therapy started at 4 CWP and increased to 10 CWP, due to events and snoring episodes. Patient tolerated CPAP very well. Optimal pressure obtained during REM- supine stage. Although, hypopneic-like events noticed but was not associated with 4% desats. EPR of 3, 2, and 1 was attempted, causing  increase in apnea and hypopnea events at times. EPR d/c'd after an increase in events. PLMS increased at times. EKG = Tachycardia and PVC's . **Please note the EKG print outs Comments added by scorer: N/A   RESPIRATORY PARAMETERS Optimal PAP Pressure (cm):  10        AHI at Optimal Pressure (/hr):            0.6 Overall Minimal O2 (%):         88.00   Supine % at Optimal Pressure (%):    18 Minimal O2 at Optimal Pressure (%): 93.0        SLEEP ARCHITECTURE The study was initiated at 10:28:53 PM and ended at 5:27:20 AM.   Sleep onset time was 15.3 minutes and the sleep efficiency was 92.5%. The total sleep time was 387.2 minutes.   The patient spent 1.29% of the night in stage N1 sleep, 60.74% in stage N2 sleep, 24.28% in stage N3 and 13.7% in REM.Stage REM latency was 142.0 minutes   Wake after sleep onset was 16.0. Alpha intrusion was absent. Supine sleep was 35.75%.   CARDIAC DATA The 2 lead EKG demonstrated sinus rhythm, pacemaker generated. The mean heart rate was 66.04 beats per minute. Other EKG findings include: PVCs.   LEG MOVEMENT DATA The total Periodic Limb Movements of Sleep (PLMS) were 302. The PLMS index was 46.80. A PLMS index of <15 is considered normal in adults.   IMPRESSIONS - CPAP was initiated at 4 cm and was titrated to optimal PAP pressure 10 cm of water. (AHI 0; RDI 0.6/h; O2 nadir 93%). - Mild oxygen desaturations were observed during this titration to a nadir of 88% at 7 cm of water. - The patient snored with moderate snoring volume during this titration study. Mild snoring was still present at 10 cm. - 2-lead EKG demonstrated: Atrial fibrillation and the start of the study which converted  to sinus rhyhm. - Moderate periodic limb movements were observed during this study. Arousals associated with PLMs were rare.   DIAGNOSIS - Obstructive Sleep Apnea (G47.33)   RECOMMENDATIONS - Recommend an initial trial of CPAP Auto therapy at 9 - 15 cm H2O with heated  humidification. A Small-Medium size Fisher&Paykel Full Face Evora Full mask was used for the titration. - Effort should be made to optimize nasal and oropharyngeal patency. - Avoid alcohol, sedatives and other CNS depressants that may worsen sleep apnea and disrupt normal sleep architecture. - Sleep hygiene should be reviewed to assess factors that may improve sleep quality. - Weight management and regular exercise should be initiated or continued. - Recommend a download and sleep clinic evaluation after 4 weeks of therapy    ASSESSMENT:    1. OSA (obstructive sleep apnea)   2. Excessive daytime sleepiness   3. Paroxysmal atrial fibrillation (HCC)   4. Pacemaker   5. Essential hypertension   6. Dyslipidemia (high LDL; low HDL)     PLAN:  Alex Maddox is a 71 year old gentleman who has significant cardiovascular comorbidities including a history of neurally mediated syncope with both cardioinhibitory and vasodepressor components, status post dual-chamber pacemaker, as well as a history of tachybradycardia syndrome, decreased LV function without previous evidence for heart failure, thoracic and abdominal aortic aneurysm, and coronary calcification.  He had symptoms of significant sleep apnea with excessive daytime sleepiness, snoring, nonrestorative sleep and with his atrial fibrillation he was found to have at least mild sleep apnea on his diagnostic evaluation overall but sleep apnea was moderate both with REM sleep with an AHI of 22.0/h and with supine sleep with an AHI of 28.3/h.  He underwent subsequent CPAP titration it was recommended that he initiate CPAP auto therapy at a range of 9 to 15 cm of water.  During his initial month of CPAP, he was hospitalized for several days resulting in his initial month download not meeting compliance standards.  However, he notes significant benefit since initiating therapy and I anticipate he will become compliant within the 90-day window.  Presently he  goes to bed between 11 PM and midnight and wakes up at 8 AM.  On his most recent download average use was 7 hours and 22 minutes.  I discussed with him optimal sleep duration at 7 and 9 hours.  He has been having some difficulty with mask leak which was contributing to some of his reduced compliance following his hospitalization.  He was using an F10 fullface mask.  I have recommended changing this to a ResMed AirFit F30 little I mask and have provided him with a sample mask in the office today.  I spent considerable time with him today discussing sleep apnea and its potential disruption of normal sleep architecture.  I discussed significant potential adverse cardiovascular consequences if left untreated particularly with reference to effects on blood pressure, nocturnal arrhythmias with increased risk for recurrent atrial fibrillation, effects on increased inflammation, insulin resistance, GERD, and potential for nocturnal hypoxemia contributing to both cardiac as well as cerebrovascular ischemia in the setting of atherosclerosis.  I also discussed the pathophysiology associated with increased nocturia if sleep apnea is untreated.  Presently, he does feel improved since initiating therapy.  He continues to have significant residual daytime sleepiness and his Epworth sleepiness scale score endorsed today was significantly elevated at 21.  He is unaware of any hypnagogic or hypnopompic hallucinations narcolepsy or cataplectic events.  We will obtain a new download within  the month for verification of optimal compliance.  I discussed with him the preponderance of rem sleep occurs in the second half of the night and the importance of using CPAP for the nights entirety.  Hopefully with optimal use and his new mask his daytime sleepiness will improve .  However, if he continues to have significant residual daytime sleepiness with therapeutic CPAP additional therapy may be necessary.  His blood pressure today is stable on  irbesartan 300 mg, carvedilol 50 mg twice a day, and furosemide.  He is maintaining sinus rhythm on Tikosyn.  He is on rosuvastatin for hyperlipidemia and is on warfarin for anticoagulation.  He will follow-up with Dr. Sallyanne Kuster.  As long as he meets compliance standards, I will see him in 1 year or sooner as needed.  Time spent: 40 minutes Medication Adjustments/Labs and Tests Ordered: Current medicines are reviewed at length with the patient today.  Concerns regarding medicines are outlined above.  Medication changes, Labs and Tests ordered today are listed in the Patient Instructions below. Patient Instructions  Medication Instructions:  Continue same medications *If you need a refill on your cardiac medications before your next appointment, please call your pharmacy*   Lab Work: None ordered   Testing/Procedures: None ordered   Follow-Up: At [facility], you and your health needs are our priority.  As part of our continuing mission to provide you with exceptional heart care, we have created designated Provider Care Teams.  These Care Teams include your primary Cardiologist (physician) and Advanced Practice Providers (APPs -  Physician Assistants and Nurse Practitioners) who all work together to provide you with the care you need, when you need it.  We recommend signing up for the patient portal called "MyChart".  Sign up information is provided on this After Visit Summary.  MyChart is used to connect with patients for Virtual Visits (Telemedicine).  Patients are able to view lab/test results, encounter notes, upcoming appointments, etc.  Non-urgent messages can be sent to your provider as well.   To learn more about what you can do with MyChart, go to [email or web address].    Your next appointment:  1 year    Call in Feb to schedule May appointment     The format for your next appointment: Office   Provider:  [place]         Signed, Shelva Majestic, MD  02/26/2022 1:48 PM    [place] [address] [place], [place], [place]  [address] Phone: [phone]

## 2022-02-16 NOTE — Patient Instructions (Signed)
Medication Instructions:  ?Continue same medications ?*If you need a refill on your cardiac medications before your next appointment, please call your pharmacy* ? ? ?Lab Work: ?None ordered ? ? ?Testing/Procedures: ?None ordered ? ? ?Follow-Up: ?At [facility], you and your health needs are our priority.  As part of our continuing mission to provide you with exceptional heart care, we have created designated Provider Care Teams.  These Care Teams include your primary Cardiologist (physician) and Advanced Practice Providers (APPs -  Physician Assistants and Nurse Practitioners) who all work together to provide you with the care you need, when you need it. ? ?We recommend signing up for the patient portal called "MyChart".  Sign up information is provided on this After Visit Summary.  MyChart is used to connect with patients for Virtual Visits (Telemedicine).  Patients are able to view lab/test results, encounter notes, upcoming appointments, etc.  Non-urgent messages can be sent to your provider as well.   ?To learn more about what you can do with MyChart, go to [email or web address].   ? ?Your next appointment:  1 year    Call in Feb to schedule May appointment  ?  ? ?The format for your next appointment: Office ? ? ?Provider:  [facility] ? ? ?Important Information About Sugar ? ? ? ? ? ? ?

## 2022-02-17 ENCOUNTER — Ambulatory Visit: Payer: Medicare Other | Admitting: *Deleted

## 2022-02-17 ENCOUNTER — Other Ambulatory Visit: Payer: Self-pay | Admitting: Cardiovascular Disease

## 2022-02-17 DIAGNOSIS — I4891 Unspecified atrial fibrillation: Secondary | ICD-10-CM

## 2022-02-17 DIAGNOSIS — Z5181 Encounter for therapeutic drug level monitoring: Secondary | ICD-10-CM | POA: Diagnosis not present

## 2022-02-17 LAB — POCT INR: INR: 2 (ref 2.0–3.0)

## 2022-02-17 NOTE — Patient Instructions (Signed)
Take warfarin 1/2 tablet tonight then continue 1 tablet daily except 1/2 tablet on Mondays, Wednesdays and Fridays.  Take at night. ?Recheck in 5 weeks ?

## 2022-02-21 ENCOUNTER — Encounter: Payer: Self-pay | Admitting: Cardiovascular Disease

## 2022-02-21 ENCOUNTER — Ambulatory Visit (INDEPENDENT_AMBULATORY_CARE_PROVIDER_SITE_OTHER): Payer: Medicare Other | Admitting: Cardiovascular Disease

## 2022-02-21 VITALS — BP 100/70 | HR 60 | Ht 72.0 in | Wt 195.2 lb

## 2022-02-21 DIAGNOSIS — G4733 Obstructive sleep apnea (adult) (pediatric): Secondary | ICD-10-CM

## 2022-02-21 DIAGNOSIS — Z5181 Encounter for therapeutic drug level monitoring: Secondary | ICD-10-CM | POA: Diagnosis not present

## 2022-02-21 DIAGNOSIS — R55 Syncope and collapse: Secondary | ICD-10-CM | POA: Diagnosis not present

## 2022-02-21 DIAGNOSIS — I7121 Aneurysm of the ascending aorta, without rupture: Secondary | ICD-10-CM | POA: Diagnosis not present

## 2022-02-21 DIAGNOSIS — I7143 Infrarenal abdominal aortic aneurysm, without rupture: Secondary | ICD-10-CM | POA: Diagnosis not present

## 2022-02-21 DIAGNOSIS — E785 Hyperlipidemia, unspecified: Secondary | ICD-10-CM

## 2022-02-21 DIAGNOSIS — I480 Paroxysmal atrial fibrillation: Secondary | ICD-10-CM | POA: Diagnosis not present

## 2022-02-21 DIAGNOSIS — I495 Sick sinus syndrome: Secondary | ICD-10-CM | POA: Diagnosis not present

## 2022-02-21 DIAGNOSIS — I10 Essential (primary) hypertension: Secondary | ICD-10-CM | POA: Diagnosis not present

## 2022-02-21 DIAGNOSIS — I5042 Chronic combined systolic (congestive) and diastolic (congestive) heart failure: Secondary | ICD-10-CM

## 2022-02-21 DIAGNOSIS — Z950 Presence of cardiac pacemaker: Secondary | ICD-10-CM | POA: Diagnosis not present

## 2022-02-21 DIAGNOSIS — Z79899 Other long term (current) drug therapy: Secondary | ICD-10-CM

## 2022-02-21 DIAGNOSIS — I2510 Atherosclerotic heart disease of native coronary artery without angina pectoris: Secondary | ICD-10-CM

## 2022-02-21 DIAGNOSIS — D6869 Other thrombophilia: Secondary | ICD-10-CM | POA: Diagnosis not present

## 2022-02-21 MED ORDER — IRBESARTAN 150 MG PO TABS: 150.0000 mg | ORAL_TABLET | Freq: Every day | ORAL | 1 refills | Status: DC

## 2022-02-21 NOTE — Progress Notes (Signed)
Patient ID: Alex Maddox, male   DOB: 08-28-1951, 71 y.o.   MRN: [id number] ?  ? ?Cardiology Office Note   ? ?Date:  02/23/2022  ? ?ID:  Alex Maddox, DOB [birth year], MRN [id number] ? ?PCP:  Alex Sites, MD  ?Cardiologist:   Sanda Klein, MD  ? ?Chief Complaint  ?Patient presents with  ? Atrial Fibrillation  ? ? ?History of Present Illness:  ?Alex Maddox is a 71 y.o. male with frequent paroxysmal atrial fibrillation, decreased LVEF without previous evidence of clinical heart failure, thoracic and abdominal aortic aneurysm, neurally mediated syncope, tachycardia-bradycardia syndrome and pacemaker (Medtronic Azure with 5086 leads). ? ?He developed acute decompensation of heart failure with mildly reduced ejection fraction after a protracted episode of persistent atrial tachyarrhythmia.  He had atrial flutter with 2: 1 AV block.  On 01/21/2022 overdrive pacing was unsuccessful but did convert into atrial fibrillation with better ventricular rate control.  However atrial flutter recurred and another attempt at overdrive pacing on 94/85/4627 again led to atrial fibrillation rather than conversion.  He was admitted to the hospital for dofetilide loading.  His QTc remained stable during the hospital stay and he converted with loading of the medication, not requiring electrical cardioversion. ? ?Since his discharge from the hospital on 01/27/2022 he has had recurrences of atrial fibrillation but the burden is much lower, now 8.9% and he has not had any persistent events or episodes of atrial flutter with rapid ventricular rates.  He continues to have fairly fast ventricular rates and during atrial fibrillation despite the fact that he is on carvedilol 50 mg twice a day. ? ?His blood pressure has been relatively low with systolic often less than 035.  He had 1 syncopal event when he stood up from a sitting position a couple of weeks ago.  He has a longstanding history of vasovagal syncope. ? ?Otherwise he feels that he is  greatly improved.  His stamina has almost increased back to his previous level.  He is very cheerful today.  He has not had to take any doses of furosemide which is prescribed for weight gain to 200 pounds or more.  His weight today is back to his usual baseline.  He does not have edema and denies orthopnea or PND.  His weight is back down to his usual baseline of in the mid 190s pound range.  He is not aware of palpitations (he has never been aware of his arrhythmia). ? ?He denies chest discomfort at rest or with activity.  He does not have intermittent claudication or any new focal neurological events.  He has not had any falls or bleeding problems.  He is compliant with warfarin anticoagulation and INR monitoring. ? ?Still struggling to make peace with the CPAP mask, but his wife has noticed that he has better energy if she wears it.  He saw Dr. Claiborne Billings in sleep clinic on May 10. ? ?His current pacemaker was a generator change performed in 2022 and current expected generator longevity is over 13 years.  Lead parameters are normal.  Atrial fibrillation burden is 8.9% (historically he usually has a burden of atrial fibrillation of 15-20% and this was up to 25% due to the recent episode of persistent atrial flutter).  Rarely requires ventricular pacing. Heart rate histograms show a moderate burden of RVR. ? ?QT interval was markedly prolonged at 568 ms but this was during ventricular paced rhythm with a QRS duration of 130 ms. ? ?He has a history  of neurally mediated syncope with both cardioinhibitory and vasodepressor components and received a dual-chamber Medtronic MRI conditional permanent pacemaker in August 2012. He has a history of paroxysmal atrial fibrillation and a history of TIA at age 89. He had a traumatic right occipital intracerebral hemorrhage while on warfarin anticoagulation in November 2016. He also has thoracic ascending aortic aneurysm, infrarenal AAA, systemic hypertension and hyperlipidemia.  Normal nuclear stress test perfusion pattern in 2012, EF 43%; similar EF by echocardiography. Echo images are very difficult and follow-up echo in 2015 could not quantify EF. He has never had clinical heart failure. He takes a statin for hyperlipidemia as well as valsartan and carvedilol for both the arrhythmia, the HTN and the cardiomyopathy. He has ascending aortic enlargement (4.2 cm) and fusiform ectasia of the infrarenal abdominal aorta (max diam 3.0 cm) and CT evidence of coronary calcification. ? ?He developed congestive heart failure during episode of protracted persistent typical atrial flutter with 2: 1 AV conduction in April 2023.  Overdrive pacing was unsuccessful at termination.  Was admitted for dofetilide loading with conversion to sinus/atrial paced rhythm and resolution of heart failure. ? ?Past Medical History:  ?Diagnosis Date  ? Abnormal echocardiogram   ? stress myoview 10/29/2010  ? Arthritis   ? " IN MY BACK "  ? Dyslipidemia   ? Dysrhythmia   ? Atrial fibrillation  ? Hypertension   ? Paroxysmal atrial fibrillation (HCC)   ? Presence of permanent cardiac pacemaker   ? Sinus node dysfunction (HCC)   ? Sleep apnea, obstructive   ? sleep study 10/09/2007  AHI 5.73/hr  REM AHI 11.90/hr   ? 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 LEFT THUMB   ? Stroke [facility])   ? Syncope   ? 2D Echocardiogram 09/27/2010 EF between 40 to 45%  ? Tachycardia-bradycardia [facility])   ? Transient ischemic attack (TIA) 06/07  ? ? ?Past Surgical History:  ?Procedure Laterality Date  ? COLONOSCOPY    ? COLONOSCOPY N/A 06/07/2017  ? Procedure: COLONOSCOPY;  Surgeon: Rogene Houston, MD;  [facility];  Service: Endoscopy;  Laterality: N/A;  9:30  ? COLONOSCOPY N/A 09/20/2018  ? Procedure: COLONOSCOPY;  Surgeon: Rogene Houston, MD;  [facility];  Service: Endoscopy;  Laterality: N/A;  1030  ? ELECTROCARDIOGRAM    ? INSERT / REPLACE / REMOVE PACEMAKER  05/2011  ? LOOP RECORDER IMPLANT  02/02/2011  ?  Medtronic- CRM   ? PACEMAKER INSERTION  05/30/2011  ? Medtronic Revo   last checked 11/07/2012  ? POLYPECTOMY  09/20/2018  ? Procedure: POLYPECTOMY;  Surgeon: Rogene Houston, MD;  [facility];  Service: Endoscopy;;  colon  ? PPM GENERATOR CHANGEOUT N/A 12/30/2020  ? Procedure: PPM GENERATOR CHANGEOUT;  Surgeon: Sanda Klein, MD;  Location: [place] CV LAB;  Service: Cardiovascular;  Laterality: N/A;  ? ? ?Outpatient Medications Prior to Visit  ?Medication Sig Dispense Refill  ? carvedilol (COREG) 25 MG tablet Take 2 tablets (50 mg total) by mouth 2 (two) times daily with a meal. 120 tablet 5  ? Cyanocobalamin (B-12 PO) Take 1 capsule by mouth daily.    ? diazepam (VALIUM) 2 MG tablet Take 2 mg by mouth every 8 (eight) hours as needed for anxiety.     ? dofetilide (TIKOSYN) 250 MCG capsule Take 1 capsule (250 mcg total) by mouth 2 (two) times daily. 60 capsule 6  ? finasteride (PROSCAR) 5 MG tablet Take 5 mg  by mouth daily.    ? Misc Natural Products (NEURIVA) CAPS Take 1 capsule by mouth daily. For memory    ? Multiple Vitamin (MULTIVITAMIN WITH MINERALS) TABS tablet Take 1 tablet by mouth daily. Adult Multivitamin 50+    ? rosuvastatin (CRESTOR) 20 MG tablet TAKE ONE TABLET ('20MG'$  TOTAL) BY MOUTH DAILY (Patient taking differently: Take 20 mg by mouth daily.) 90 tablet 3  ? Saw Palmetto 450 MG CAPS Take 900 mg by mouth daily.    ? trospium (SANCTURA) 20 MG tablet Take 20 mg by mouth daily.    ? warfarin (COUMADIN) 5 MG tablet TAKE ONE TABLET BY MOUTH EVERY DAY AS DIRECTED BY COUMADIN CLINIC (Patient taking differently: Take 2.5-5 mg by mouth daily. Take 1/2 tablet (2.5 mg) on Mon, Wed, and Fri. Then take 1 tablet (5 mg) on Tues, Thurs, Sat, and Sun or as directed by Coumadin Clinic) 90 tablet 1  ? irbesartan (AVAPRO) 300 MG tablet Take 1 tablet (300 mg total) by mouth daily. 90 tablet 0  ? furosemide (LASIX) 40 MG tablet Take one tablet daily until your weight reaches 200 lbs or less, then stop.  (Patient not taking: Reported on 02/16/2022) 30 tablet 0  ? Polyethyl Glycol-Propyl Glycol 0.4-0.3 % SOLN Place 1 drop into both eyes 3 (three) times daily as needed (for dry eyes.). (Patient not taking: Rep

## 2022-02-21 NOTE — Patient Instructions (Addendum)
Medication Instructions:  ?DECREASE the Irbesartan to 150 mg once daily ? ?*If you need a refill on your cardiac medications before your next appointment, please call your pharmacy* ? ? ?Lab Work: ?None ordered ?If you have labs (blood work) drawn today and your tests are completely normal, you will receive your results only by: ?MyChart Message (if you have MyChart) OR ?A paper copy in the mail ?If you have any lab test that is abnormal or we need to change your treatment, we will call you to review the results. ? ? ?Testing/Procedures: ?None ordered ? ? ?Follow-Up: ?At [facility], you and your health needs are our priority.  As part of our continuing mission to provide you with exceptional heart care, we have created designated Provider Care Teams.  These Care Teams include your primary Cardiologist (physician) and Advanced Practice Providers (APPs -  Physician Assistants and Nurse Practitioners) who all work together to provide you with the care you need, when you need it. ? ?We recommend signing up for the patient portal called "MyChart".  Sign up information is provided on this After Visit Summary.  MyChart is used to connect with patients for Virtual Visits (Telemedicine).  Patients are able to view lab/test results, encounter notes, upcoming appointments, etc.  Non-urgent messages can be sent to your provider as well.   ?To learn more about what you can do with MyChart, go to [email or web address].   ? ?Your next appointment:   ?6 month(s) ? ?The format for your next appointment:   ?In Person ? ?Provider:   ?Sanda Klein, MD { ? ? ?Your physician recommends that you weigh yourself everyday at the same time, on the same scale and with the same amount of clothing. Please keep a record of these weights and bring to your next appointment. ? ?

## 2022-02-23 ENCOUNTER — Other Ambulatory Visit (HOSPITAL_COMMUNITY): Payer: Self-pay

## 2022-02-23 ENCOUNTER — Encounter: Payer: Self-pay | Admitting: Cardiovascular Disease

## 2022-02-26 ENCOUNTER — Encounter: Payer: Self-pay | Admitting: Cardiovascular Disease

## 2022-03-02 ENCOUNTER — Ambulatory Visit: Payer: Medicare Other | Admitting: Internal Medicine

## 2022-03-09 DIAGNOSIS — E782 Mixed hyperlipidemia: Secondary | ICD-10-CM | POA: Diagnosis not present

## 2022-03-09 DIAGNOSIS — I10 Essential (primary) hypertension: Secondary | ICD-10-CM | POA: Diagnosis not present

## 2022-03-09 DIAGNOSIS — I480 Paroxysmal atrial fibrillation: Secondary | ICD-10-CM | POA: Diagnosis not present

## 2022-03-15 DIAGNOSIS — G4733 Obstructive sleep apnea (adult) (pediatric): Secondary | ICD-10-CM | POA: Diagnosis not present

## 2022-03-17 DIAGNOSIS — R350 Frequency of micturition: Secondary | ICD-10-CM | POA: Diagnosis not present

## 2022-03-24 ENCOUNTER — Ambulatory Visit: Payer: Medicare Other | Admitting: *Deleted

## 2022-03-24 DIAGNOSIS — Z5181 Encounter for therapeutic drug level monitoring: Secondary | ICD-10-CM

## 2022-03-24 DIAGNOSIS — I4891 Unspecified atrial fibrillation: Secondary | ICD-10-CM | POA: Diagnosis not present

## 2022-03-24 LAB — POCT INR: INR: 2.3 (ref 2.0–3.0)

## 2022-03-24 NOTE — Patient Instructions (Signed)
Continue 1 tablet daily except 1/2 tablet on Mondays, Wednesdays and Fridays.  Take at night. Recheck in 6 weeks 

## 2022-03-31 ENCOUNTER — Ambulatory Visit (INDEPENDENT_AMBULATORY_CARE_PROVIDER_SITE_OTHER): Payer: Medicare Other

## 2022-03-31 ENCOUNTER — Other Ambulatory Visit: Payer: Self-pay | Admitting: Cardiovascular Disease

## 2022-03-31 DIAGNOSIS — I495 Sick sinus syndrome: Secondary | ICD-10-CM | POA: Diagnosis not present

## 2022-04-03 LAB — CUP PACEART REMOTE DEVICE CHECK
Battery Remaining Longevity: 154 mo
Battery Voltage: 3.05 V
Brady Statistic AP VP Percent: 15.63 %
Brady Statistic AP VS Percent: 26.9 %
Brady Statistic AS VP Percent: 0.18 %
Brady Statistic AS VS Percent: 57.33 %
Brady Statistic RA Percent Paced: 39.26 %
Brady Statistic RV Percent Paced: 15.24 %
Date Time Interrogation Session: 20230622005559
Implantable Lead Implant Date: 20120820
Implantable Lead Implant Date: 20120820
Implantable Lead Location: 753859
Implantable Lead Location: 753860
Implantable Pulse Generator Implant Date: 20220323
Lead Channel Impedance Value: 361 Ohm
Lead Channel Impedance Value: 361 Ohm
Lead Channel Impedance Value: 456 Ohm
Lead Channel Impedance Value: 456 Ohm
Lead Channel Pacing Threshold Amplitude: 0.75 V
Lead Channel Pacing Threshold Amplitude: 1.375 V
Lead Channel Pacing Threshold Pulse Width: 0.4 ms
Lead Channel Pacing Threshold Pulse Width: 0.4 ms
Lead Channel Sensing Intrinsic Amplitude: 3.125 mV
Lead Channel Sensing Intrinsic Amplitude: 3.125 mV
Lead Channel Sensing Intrinsic Amplitude: 7.375 mV
Lead Channel Sensing Intrinsic Amplitude: 7.375 mV
Lead Channel Setting Pacing Amplitude: 1.5 V
Lead Channel Setting Pacing Amplitude: 3.75 V
Lead Channel Setting Pacing Pulse Width: 0.4 ms
Lead Channel Setting Sensing Sensitivity: 0.9 mV

## 2022-04-04 ENCOUNTER — Ambulatory Visit (INDEPENDENT_AMBULATORY_CARE_PROVIDER_SITE_OTHER): Payer: Medicare Other | Admitting: Gastroenterology

## 2022-04-07 NOTE — Progress Notes (Signed)
Remote pacemaker transmission.   

## 2022-04-14 DIAGNOSIS — G4733 Obstructive sleep apnea (adult) (pediatric): Secondary | ICD-10-CM | POA: Diagnosis not present

## 2022-04-20 ENCOUNTER — Ambulatory Visit (HOSPITAL_COMMUNITY)
Admission: RE | Admit: 2022-04-20 | Discharge: 2022-04-20 | Disposition: A | Discharge status: Home / Self Care | Payer: Medicare Other | Source: Ambulatory Visit | Attending: Cardiovascular Disease | Admitting: Cardiovascular Disease

## 2022-04-20 DIAGNOSIS — I714 Abdominal aortic aneurysm, without rupture, unspecified: Secondary | ICD-10-CM | POA: Insufficient documentation

## 2022-04-20 DIAGNOSIS — I7143 Infrarenal abdominal aortic aneurysm, without rupture: Secondary | ICD-10-CM | POA: Diagnosis not present

## 2022-04-21 ENCOUNTER — Other Ambulatory Visit: Payer: Self-pay

## 2022-04-21 DIAGNOSIS — I714 Abdominal aortic aneurysm, without rupture, unspecified: Secondary | ICD-10-CM

## 2022-04-27 DIAGNOSIS — I5032 Chronic diastolic (congestive) heart failure: Secondary | ICD-10-CM | POA: Diagnosis not present

## 2022-04-27 DIAGNOSIS — D696 Thrombocytopenia, unspecified: Secondary | ICD-10-CM | POA: Diagnosis not present

## 2022-04-27 DIAGNOSIS — N182 Chronic kidney disease, stage 2 (mild): Secondary | ICD-10-CM | POA: Diagnosis not present

## 2022-04-27 DIAGNOSIS — R809 Proteinuria, unspecified: Secondary | ICD-10-CM | POA: Diagnosis not present

## 2022-05-04 DIAGNOSIS — I129 Hypertensive chronic kidney disease with stage 1 through stage 4 chronic kidney disease, or unspecified chronic kidney disease: Secondary | ICD-10-CM | POA: Diagnosis not present

## 2022-05-04 DIAGNOSIS — D696 Thrombocytopenia, unspecified: Secondary | ICD-10-CM | POA: Diagnosis not present

## 2022-05-04 DIAGNOSIS — I5032 Chronic diastolic (congestive) heart failure: Secondary | ICD-10-CM | POA: Diagnosis not present

## 2022-05-04 DIAGNOSIS — R809 Proteinuria, unspecified: Secondary | ICD-10-CM | POA: Diagnosis not present

## 2022-05-04 DIAGNOSIS — N1831 Chronic kidney disease, stage 3a: Secondary | ICD-10-CM | POA: Diagnosis not present

## 2022-05-05 ENCOUNTER — Ambulatory Visit (INDEPENDENT_AMBULATORY_CARE_PROVIDER_SITE_OTHER): Payer: Medicare Other | Admitting: *Deleted

## 2022-05-05 DIAGNOSIS — I4891 Unspecified atrial fibrillation: Secondary | ICD-10-CM | POA: Diagnosis not present

## 2022-05-05 DIAGNOSIS — Z5181 Encounter for therapeutic drug level monitoring: Secondary | ICD-10-CM

## 2022-05-05 LAB — POCT INR: INR: 2.1 (ref 2.0–3.0)

## 2022-05-05 NOTE — Patient Instructions (Signed)
Description   Continue 1 tablet daily except 1/2 tablet on Mondays, Wednesdays and Fridays.  Take at night. Recheck in 6 weeks

## 2022-05-15 DIAGNOSIS — G4733 Obstructive sleep apnea (adult) (pediatric): Secondary | ICD-10-CM | POA: Diagnosis not present

## 2022-06-01 DIAGNOSIS — I10 Essential (primary) hypertension: Secondary | ICD-10-CM | POA: Diagnosis not present

## 2022-06-01 DIAGNOSIS — I5032 Chronic diastolic (congestive) heart failure: Secondary | ICD-10-CM | POA: Diagnosis not present

## 2022-06-01 DIAGNOSIS — L299 Pruritus, unspecified: Secondary | ICD-10-CM | POA: Diagnosis not present

## 2022-06-10 DIAGNOSIS — I10 Essential (primary) hypertension: Secondary | ICD-10-CM | POA: Diagnosis not present

## 2022-06-10 DIAGNOSIS — I700 Atherosclerosis of aorta: Secondary | ICD-10-CM | POA: Diagnosis not present

## 2022-06-10 DIAGNOSIS — B880 Other acariasis: Secondary | ICD-10-CM | POA: Diagnosis not present

## 2022-06-10 DIAGNOSIS — I5032 Chronic diastolic (congestive) heart failure: Secondary | ICD-10-CM | POA: Diagnosis not present

## 2022-06-10 DIAGNOSIS — W57XXXA Bitten or stung by nonvenomous insect and other nonvenomous arthropods, initial encounter: Secondary | ICD-10-CM | POA: Diagnosis not present

## 2022-06-10 DIAGNOSIS — I719 Aortic aneurysm of unspecified site, without rupture: Secondary | ICD-10-CM | POA: Diagnosis not present

## 2022-06-15 DIAGNOSIS — G4733 Obstructive sleep apnea (adult) (pediatric): Secondary | ICD-10-CM | POA: Diagnosis not present

## 2022-06-16 ENCOUNTER — Telehealth: Payer: Self-pay | Admitting: Cardiology

## 2022-06-16 ENCOUNTER — Ambulatory Visit: Payer: Medicare Other | Attending: Cardiovascular Disease | Admitting: *Deleted

## 2022-06-16 DIAGNOSIS — I4891 Unspecified atrial fibrillation: Secondary | ICD-10-CM

## 2022-06-16 DIAGNOSIS — Z5181 Encounter for therapeutic drug level monitoring: Secondary | ICD-10-CM | POA: Diagnosis not present

## 2022-06-16 LAB — POCT INR: INR: 2.9 (ref 2.0–3.0)

## 2022-06-16 NOTE — Patient Instructions (Signed)
Continue 1 tablet daily except 1/2 tablet on Mondays, Wednesdays and Fridays.  Take at night. Recheck in 6 weeks

## 2022-06-16 NOTE — Telephone Encounter (Signed)
I did not need this encounter. °

## 2022-06-17 ENCOUNTER — Telehealth: Payer: Self-pay | Admitting: Cardiovascular Disease

## 2022-06-17 NOTE — Telephone Encounter (Signed)
Pharmacy calling to see if the patient anabiotic will effect patient coumadin. Please advise

## 2022-06-17 NOTE — Telephone Encounter (Addendum)
Returned pharmacist's call concerning antibiotic interaction with Warfarin. Pt was prescribed Levaquin '500mg'$  x 10 days starting today.   Called and spoke with pt's wife. Explained that this antibiotic can cause INR to increase and INR will need to be checked next week. Instructed pt to continue regular Warfarin dosing instructions; however, only take 1/2 tablet on Sunday. Appt scheduled to recheck INR on Tuesdays 06/21/22. Verbalized understanding.

## 2022-06-21 ENCOUNTER — Ambulatory Visit: Payer: Medicare Other | Attending: Cardiovascular Disease | Admitting: *Deleted

## 2022-06-21 DIAGNOSIS — I4891 Unspecified atrial fibrillation: Secondary | ICD-10-CM | POA: Diagnosis not present

## 2022-06-21 DIAGNOSIS — Z5181 Encounter for therapeutic drug level monitoring: Secondary | ICD-10-CM | POA: Diagnosis not present

## 2022-06-21 LAB — POCT INR: INR: 2.8 (ref 2.0–3.0)

## 2022-06-21 NOTE — Patient Instructions (Signed)
Continue 1 tablet daily except 1/2 tablet on Mondays, Wednesdays and Fridays.  Take at night. Recheck in 6 weeks

## 2022-06-23 ENCOUNTER — Ambulatory Visit (HOSPITAL_COMMUNITY)
Admission: RE | Admit: 2022-06-23 | Discharge: 2022-06-23 | Disposition: A | Discharge status: Home / Self Care | Payer: Medicare Other | Source: Ambulatory Visit | Attending: Nurse Practitioner | Admitting: Nurse Practitioner

## 2022-06-23 ENCOUNTER — Encounter (HOSPITAL_COMMUNITY): Payer: Self-pay | Admitting: Nurse Practitioner

## 2022-06-23 VITALS — BP 104/66 | HR 60 | Ht 72.0 in | Wt 190.0 lb

## 2022-06-23 DIAGNOSIS — D6869 Other thrombophilia: Secondary | ICD-10-CM | POA: Diagnosis not present

## 2022-06-23 DIAGNOSIS — I110 Hypertensive heart disease with heart failure: Secondary | ICD-10-CM | POA: Diagnosis not present

## 2022-06-23 DIAGNOSIS — I4892 Unspecified atrial flutter: Secondary | ICD-10-CM | POA: Insufficient documentation

## 2022-06-23 DIAGNOSIS — I5040 Unspecified combined systolic (congestive) and diastolic (congestive) heart failure: Secondary | ICD-10-CM | POA: Insufficient documentation

## 2022-06-23 DIAGNOSIS — I480 Paroxysmal atrial fibrillation: Secondary | ICD-10-CM | POA: Diagnosis not present

## 2022-06-23 DIAGNOSIS — I714 Abdominal aortic aneurysm, without rupture, unspecified: Secondary | ICD-10-CM | POA: Insufficient documentation

## 2022-06-23 DIAGNOSIS — I4891 Unspecified atrial fibrillation: Secondary | ICD-10-CM | POA: Insufficient documentation

## 2022-06-23 DIAGNOSIS — R55 Syncope and collapse: Secondary | ICD-10-CM | POA: Insufficient documentation

## 2022-06-23 DIAGNOSIS — Z7901 Long term (current) use of anticoagulants: Secondary | ICD-10-CM | POA: Insufficient documentation

## 2022-06-23 DIAGNOSIS — G4733 Obstructive sleep apnea (adult) (pediatric): Secondary | ICD-10-CM | POA: Diagnosis not present

## 2022-06-23 LAB — BASIC METABOLIC PANEL
Anion gap: 3 — ABNORMAL LOW (ref 5–15)
BUN: 21 mg/dL (ref 8–23)
CO2: 31 mmol/L (ref 22–32)
Calcium: 9.5 mg/dL (ref 8.9–10.3)
Chloride: 106 mmol/L (ref 98–111)
Creatinine, Ser: 1.25 mg/dL — ABNORMAL HIGH (ref 0.61–1.24)
GFR, Estimated: >60 mL/min (ref >60)
Glucose, Bld: 108 mg/dL — ABNORMAL HIGH (ref 70–99)
Potassium: 4.2 mmol/L (ref 3.5–5.1)
Sodium: 140 mmol/L (ref 135–145)

## 2022-06-23 LAB — MAGNESIUM: Magnesium: 2.3 mg/dL (ref 1.7–2.4)

## 2022-06-23 NOTE — Progress Notes (Signed)
Primary Care Physician: Sharilyn Sites, MD Primary Cardiologist: Dr Sallyanne Kuster Primary Electrophysiologist: none Referring Physician: Dr Levin Bacon Alex Maddox is a 71 y.o. male with a history of OSA, coronary calcifications, CHF, thoracic and abdominal aortic aneurysm, neurally mediated syncope, tachycardia-bradycardia syndrome and pacemaker, atrial fibrillation, atrial flutter who presents for consultation in the [facility]. Patient is on warfarin for a CHADS2VASC score of 4. He was seen by Dr Sallyanne Kuster 01/21/22 in rapid atrial flutter. Overdrive pacing with his device converted him to rate controlled afib. Patient called [facility] with heart rates back up to 140s with some SOB and lower extremity edema.   Pt is here 4/27 for f/u Tikosyn admit.ekg shows av paced rhythm. He feels improved. Qt is at 568 ms but is a paced rhythm and is comparable to paced  ekg's in hospital. Last EKG in hospital was manually corrected to 484 ms with  a qt at 560 ms . He has felt more fatigued sine laving hospital as he has been trying to get use to using cpap.   F/u in afib clinic, 06/23/22 for tikosyn surveillance. He has not noted any afib. He just had a root canal which has affected his eating and overall well being for the last several days. He is being compliant with dofetilide and wafarin with a CHA2DS2VASc  score of  4.  Today, he denies symptoms of palpitations, chest pain, shortness of breath, orthopnea, PND, lower extremity edema, dizziness, presyncope, syncope, snoring, daytime somnolence, bleeding, or neurologic sequela. The patient is tolerating medications without difficulties and is otherwise without complaint today.    Atrial Fibrillation Risk Factors:  he does have symptoms or diagnosis of sleep apnea. he is working on starting CPAP. he does not have a history of rheumatic fever.   he has a BMI of Body mass index is 25.77 kg/m.Marland Kitchen Filed Weights   06/23/22 1027   Weight: 86.2 kg     Family History  Problem Relation Age of Onset   Hypertension Mother    Heart attack Father 44       deceased from heart attack   Hypertension Brother    Colon cancer Neg Hx      Atrial Fibrillation Management history:  Previous antiarrhythmic drugs: none Previous cardioversions: none Previous ablations: none CHADS2VASC score: 4 Anticoagulation history: warfarin    Past Medical History:  Diagnosis Date   Abnormal echocardiogram    stress myoview 10/29/2010   Arthritis    " IN MY BACK "   Dyslipidemia    Dysrhythmia    Atrial fibrillation   Hypertension    Paroxysmal atrial fibrillation (HCC)    Presence of permanent cardiac pacemaker    Sinus node dysfunction (HCC)    Sleep apnea, obstructive    sleep study 10/09/2007  AHI 5.73/hr  REM AHI 11.90/hr    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   LEFT THUMB    Stroke ([place])    Syncope    2D Echocardiogram 09/27/2010 EF between 40 to 45%   Tachycardia-bradycardia ([place])    Transient ischemic attack (TIA) 06/07   Past Surgical History:  Procedure Laterality Date   COLONOSCOPY     COLONOSCOPY N/A 06/07/2017   Procedure: COLONOSCOPY;  Surgeon: Rogene Houston, MD;  [facility];  Service: Endoscopy;  Laterality: N/A;  9:30   COLONOSCOPY N/A 09/20/2018   Procedure: COLONOSCOPY;  Surgeon: Rogene Houston, MD;  [facility];  Service: Endoscopy;  Laterality: N/A;  [place] / REPLACE / REMOVE PACEMAKER  05/2011   LOOP RECORDER IMPLANT  02/02/2011   Medtronic- CRM    PACEMAKER INSERTION  05/30/2011   Medtronic Revo   last checked 11/07/2012   POLYPECTOMY  09/20/2018   Procedure: POLYPECTOMY;  Surgeon: Rogene Houston, MD;  [facility];  Service: Endoscopy;;  colon   PPM GENERATOR CHANGEOUT N/A 12/30/2020   Procedure: PPM GENERATOR CHANGEOUT;  Surgeon: Sanda Klein, MD;  Location: [place] CV LAB;  Service: Cardiovascular;   Laterality: N/A;    Current Outpatient Medications  Medication Sig Dispense Refill   carvedilol (COREG) 25 MG tablet TAKE TWO TABLETS ('50MG'$  TOTAL) BY MOUTH TWO TIMES DAILY WITH A MEAL 120 tablet 6   Cyanocobalamin (B-12 PO) Take 1 capsule by mouth daily.     diazepam (VALIUM) 2 MG tablet Take 2 mg by mouth every 8 (eight) hours as needed for anxiety.      dofetilide (TIKOSYN) 250 MCG capsule Take 1 capsule (250 mcg total) by mouth 2 (two) times daily. 60 capsule 6   finasteride (PROSCAR) 5 MG tablet Take 5 mg by mouth daily.     furosemide (LASIX) 40 MG tablet Take one tablet daily until your weight reaches 200 lbs or less, then stop. 30 tablet 0   irbesartan (AVAPRO) 150 MG tablet Take 1 tablet (150 mg total) by mouth daily. 90 tablet 1   Misc Natural Products (NEURIVA) CAPS Take 1 capsule by mouth daily. For memory     Multiple Vitamin (MULTIVITAMIN WITH MINERALS) TABS tablet Take 1 tablet by mouth daily. Adult Multivitamin 50+     Polyethyl Glycol-Propyl Glycol 0.4-0.3 % SOLN Place 1 drop into both eyes 3 (three) times daily as needed (for dry eyes.).     potassium chloride 20 MEQ TBCR Take one tablet daily with your furosemide until your weight reaches 200 lbs or less, then stop. 30 tablet 0   rosuvastatin (CRESTOR) 20 MG tablet TAKE ONE TABLET ('20MG'$  TOTAL) BY MOUTH DAILY (Patient taking differently: Take 20 mg by mouth daily.) 90 tablet 3   Saw Palmetto 450 MG CAPS Take 900 mg by mouth daily.     trospium (SANCTURA) 20 MG tablet Take 20 mg by mouth daily.     warfarin (COUMADIN) 5 MG tablet TAKE ONE TABLET BY MOUTH EVERY DAY AS DIRECTED BY COUMADIN CLINIC (Patient taking differently: Take 2.5-5 mg by mouth daily. Take 1/2 tablet (2.5 mg) on Mon, Wed, and Fri. Then take 1 tablet (5 mg) on Tues, Thurs, Sat, and Sun or as directed by Coumadin Clinic) 90 tablet 1   No current facility-administered medications for this encounter.    Allergies  Allergen Reactions   Prevnar 13 [Pneumococcal  13-Val Conj Vacc]     Rash, caused him to be unconscious     Social History   Socioeconomic History   Marital status: Married    Spouse name: Not on file   Number of children: 0   Years of education: Not on file   Highest education level: Not on file  Occupational History   Not on file  Tobacco Use   Smoking status: Never   Smokeless tobacco: Never  Vaping Use   Vaping Use: Never used  Substance and Sexual Activity   Alcohol use: Yes    Comment: occasionally wine   Drug use: No   Sexual activity: Not on file  Other  Topics Concern   Not on file  Social History Narrative   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   Right Handed    Lives in a two story home   Social Determinants of Health   Financial Resource Strain: Not on file  Food Insecurity: Not on file  Transportation Needs: Not on file  Physical Activity: Not on file  Stress: Not on file  Social Connections: Not on file  Intimate Partner Violence: Not on file     ROS- All systems are reviewed and negative except as per the HPI above.  Physical Exam: Vitals:   06/23/22 1027  BP: 104/66  Pulse: 60  Weight: 86.2 kg  Height: 6' (1.829 m)    GEN- The patient is a well appearing male, alert and oriented x 3 today.   Head- normocephalic, atraumatic Eyes-  Sclera clear, conjunctiva pink Ears- hearing intact Oropharynx- clear Neck- supple  Lungs- Clear to ausculation bilaterally, normal work of breathing Heart- irregular rate and rhythm, no murmurs, rubs or gallops  GI- soft, NT, ND, + BS Extremities- no clubbing, cyanosis, or edema MS- no significant deformity or atrophy Skin- no rash or lesion Psych- euthymic mood, full affect Neuro- strength and sensation are intact  Wt Readings from Last 3 Encounters:  06/23/22 86.2 kg  02/21/22 88.5 kg  02/16/22 89.2 kg    Vent. rate 60 BPM PR interval 190 ms QRS duration 122 ms QT/QTcB 456/456 ms P-R-T axes 29 49 11 Atrial-paced rhythm Right  bundle branch block Abnormal ECG When compared with ECG of 03-Feb-2022 14:48, PREVIOUS ECG IS PRESENTEKG today demonstrates    Epic records are reviewed at length today  CHA2DS2-VASc Score = 4  The patient's score is based upon: CHF History: 1 HTN History: 1 Diabetes History: 0 Stroke History: 0 Vascular Disease History: 1 Age Score: 1 Gender Score: 0       ASSESSMENT AND PLAN: 1. Paroxysmal Atrial Fibrillation/atrial flutter The patient's CHA2DS2-VASc score is 4, indicating a 4.8% annual risk of stroke.   Pt  admitted April 2023 for successful Tikosyn load.  Remains in rhythm, a paced today    Check bmet/mag today  Continue dofetilide 250 mcg bid  Continue warfarin Continue carvedilol 50 mg BID  2. Secondary Hypercoagulable State (ICD10:  D68.69) The patient is at significant risk for stroke/thromboembolism based upon his CHA2DS2-VASc Score of 4.  Continue Warfarin (Coumadin).   3. Tachybradycardia syndrome S/p PPM, followed by Dr Sallyanne Kuster   4. Obstructive sleep apnea Followed by Dr Claiborne Billings  5. Combined systolic and diastolic CHF Stable   6. HTN Stable, no changes today.   F/u  afib clinic in 6 months for tikosyn surveillance    Dr. Loletha Grayer has scheduled   Alex Maddox. Alex Maddox, [facility] [address] [place], [place] [address] [phone]

## 2022-06-30 ENCOUNTER — Ambulatory Visit (INDEPENDENT_AMBULATORY_CARE_PROVIDER_SITE_OTHER): Payer: Medicare Other

## 2022-06-30 DIAGNOSIS — I495 Sick sinus syndrome: Secondary | ICD-10-CM

## 2022-07-01 LAB — CUP PACEART REMOTE DEVICE CHECK
Battery Remaining Longevity: 147 mo
Battery Voltage: 3.03 V
Brady Statistic AP VP Percent: 21.09 %
Brady Statistic AP VS Percent: 25.18 %
Brady Statistic AS VP Percent: 0.35 %
Brady Statistic AS VS Percent: 53.45 %
Brady Statistic RA Percent Paced: 40.68 %
Brady Statistic RV Percent Paced: 20.09 %
Date Time Interrogation Session: 20230921063123
Implantable Lead Implant Date: 20120820
Implantable Lead Implant Date: 20120820
Implantable Lead Location: 753859
Implantable Lead Location: 753860
Implantable Pulse Generator Implant Date: 20220323
Lead Channel Impedance Value: 342 Ohm
Lead Channel Impedance Value: 342 Ohm
Lead Channel Impedance Value: 418 Ohm
Lead Channel Impedance Value: 456 Ohm
Lead Channel Pacing Threshold Amplitude: 0.75 V
Lead Channel Pacing Threshold Amplitude: 1.25 V
Lead Channel Pacing Threshold Pulse Width: 0.4 ms
Lead Channel Pacing Threshold Pulse Width: 0.4 ms
Lead Channel Sensing Intrinsic Amplitude: 7 mV
Lead Channel Sensing Intrinsic Amplitude: 7 mV
Lead Channel Sensing Intrinsic Amplitude: 7.875 mV
Lead Channel Sensing Intrinsic Amplitude: 7.875 mV
Lead Channel Setting Pacing Amplitude: 1.5 V
Lead Channel Setting Pacing Amplitude: 3.75 V
Lead Channel Setting Pacing Pulse Width: 0.4 ms
Lead Channel Setting Sensing Sensitivity: 0.9 mV

## 2022-07-02 ENCOUNTER — Other Ambulatory Visit: Payer: Self-pay | Admitting: Cardiovascular Disease

## 2022-07-04 NOTE — Telephone Encounter (Signed)
Prescription refill request received for warfarin Lov: [place], 01/21/2022 Next INR check: 10/24 Warfarin tablet strength: '5mg'$ 

## 2022-07-11 NOTE — Progress Notes (Signed)
Remote pacemaker transmission.   

## 2022-07-15 DIAGNOSIS — G4733 Obstructive sleep apnea (adult) (pediatric): Secondary | ICD-10-CM | POA: Diagnosis not present

## 2022-08-02 ENCOUNTER — Ambulatory Visit: Payer: Medicare Other | Attending: Cardiovascular Disease | Admitting: *Deleted

## 2022-08-02 ENCOUNTER — Other Ambulatory Visit: Payer: Self-pay | Admitting: Cardiovascular Disease

## 2022-08-02 DIAGNOSIS — I4891 Unspecified atrial fibrillation: Secondary | ICD-10-CM | POA: Diagnosis not present

## 2022-08-02 DIAGNOSIS — Z5181 Encounter for therapeutic drug level monitoring: Secondary | ICD-10-CM | POA: Diagnosis not present

## 2022-08-02 LAB — POCT INR: INR: 2 (ref 2.0–3.0)

## 2022-08-02 NOTE — Telephone Encounter (Signed)
Rx refill sent to pharmacy. 

## 2022-08-02 NOTE — Patient Instructions (Signed)
Continue 1 tablet daily except 1/2 tablet on Mondays, Wednesdays and Fridays.  Take at night. Recheck in 6 weeks

## 2022-08-11 DIAGNOSIS — L821 Other seborrheic keratosis: Secondary | ICD-10-CM | POA: Diagnosis not present

## 2022-08-11 DIAGNOSIS — D225 Melanocytic nevi of trunk: Secondary | ICD-10-CM | POA: Diagnosis not present

## 2022-08-11 DIAGNOSIS — D2239 Melanocytic nevi of other parts of face: Secondary | ICD-10-CM | POA: Diagnosis not present

## 2022-08-11 DIAGNOSIS — L570 Actinic keratosis: Secondary | ICD-10-CM | POA: Diagnosis not present

## 2022-08-11 DIAGNOSIS — L820 Inflamed seborrheic keratosis: Secondary | ICD-10-CM | POA: Diagnosis not present

## 2022-08-11 DIAGNOSIS — L578 Other skin changes due to chronic exposure to nonionizing radiation: Secondary | ICD-10-CM | POA: Diagnosis not present

## 2022-08-15 DIAGNOSIS — H6121 Impacted cerumen, right ear: Secondary | ICD-10-CM | POA: Diagnosis not present

## 2022-08-15 DIAGNOSIS — G4733 Obstructive sleep apnea (adult) (pediatric): Secondary | ICD-10-CM | POA: Diagnosis not present

## 2022-08-15 DIAGNOSIS — H6122 Impacted cerumen, left ear: Secondary | ICD-10-CM | POA: Diagnosis not present

## 2022-09-13 ENCOUNTER — Ambulatory Visit: Payer: Medicare Other | Attending: Cardiovascular Disease | Admitting: *Deleted

## 2022-09-13 DIAGNOSIS — I4891 Unspecified atrial fibrillation: Secondary | ICD-10-CM

## 2022-09-13 DIAGNOSIS — Z5181 Encounter for therapeutic drug level monitoring: Secondary | ICD-10-CM | POA: Diagnosis not present

## 2022-09-13 LAB — POCT INR: INR: 1.9 — AB (ref 2.0–3.0)

## 2022-09-13 NOTE — Patient Instructions (Signed)
Take warfarin 1 1/2 tablets tonight then resume 1 tablet daily except 1/2 tablet on Mondays, Wednesdays and Fridays.  Take at night. Recheck in 6 weeks

## 2022-09-14 DIAGNOSIS — G4733 Obstructive sleep apnea (adult) (pediatric): Secondary | ICD-10-CM | POA: Diagnosis not present

## 2022-09-22 ENCOUNTER — Other Ambulatory Visit (HOSPITAL_COMMUNITY): Payer: Self-pay | Admitting: Nurse Practitioner

## 2022-09-29 ENCOUNTER — Ambulatory Visit (INDEPENDENT_AMBULATORY_CARE_PROVIDER_SITE_OTHER): Payer: Medicare Other

## 2022-09-29 DIAGNOSIS — I495 Sick sinus syndrome: Secondary | ICD-10-CM

## 2022-09-29 LAB — CUP PACEART REMOTE DEVICE CHECK
Battery Remaining Longevity: 144 mo
Battery Voltage: 3.02 V
Brady Statistic AP VP Percent: 20.55 %
Brady Statistic AP VS Percent: 22.47 %
Brady Statistic AS VP Percent: 0.25 %
Brady Statistic AS VS Percent: 56.78 %
Brady Statistic RA Percent Paced: 39.16 %
Brady Statistic RV Percent Paced: 19.77 %
Date Time Interrogation Session: 20231221052841
Implantable Lead Connection Status: 753985
Implantable Lead Connection Status: 753985
Implantable Lead Implant Date: 20120820
Implantable Lead Implant Date: 20120820
Implantable Lead Location: 753859
Implantable Lead Location: 753860
Implantable Pulse Generator Implant Date: 20220323
Lead Channel Impedance Value: 342 Ohm
Lead Channel Impedance Value: 361 Ohm
Lead Channel Impedance Value: 437 Ohm
Lead Channel Impedance Value: 437 Ohm
Lead Channel Pacing Threshold Amplitude: 0.75 V
Lead Channel Pacing Threshold Amplitude: 1.75 V
Lead Channel Pacing Threshold Pulse Width: 0.4 ms
Lead Channel Pacing Threshold Pulse Width: 0.4 ms
Lead Channel Sensing Intrinsic Amplitude: 4 mV
Lead Channel Sensing Intrinsic Amplitude: 4 mV
Lead Channel Sensing Intrinsic Amplitude: 6.5 mV
Lead Channel Sensing Intrinsic Amplitude: 6.5 mV
Lead Channel Setting Pacing Amplitude: 1.5 V
Lead Channel Setting Pacing Amplitude: 3.5 V
Lead Channel Setting Pacing Pulse Width: 0.4 ms
Lead Channel Setting Sensing Sensitivity: 0.9 mV
Zone Setting Status: 755011

## 2022-10-05 ENCOUNTER — Other Ambulatory Visit: Payer: Self-pay | Admitting: Cardiovascular Disease

## 2022-10-05 NOTE — Telephone Encounter (Signed)
Refill request for warfarin:  Last INR was 1.9 on 09/13/22 Next INR due 10/25/22 LOV was 02/21/22  Refill approved.

## 2022-10-15 DIAGNOSIS — G4733 Obstructive sleep apnea (adult) (pediatric): Secondary | ICD-10-CM | POA: Diagnosis not present

## 2022-10-20 ENCOUNTER — Ambulatory Visit: Payer: Medicare Other | Attending: Cardiovascular Disease | Admitting: Cardiovascular Disease

## 2022-10-20 ENCOUNTER — Encounter: Payer: Self-pay | Admitting: Cardiovascular Disease

## 2022-10-20 VITALS — BP 124/74 | HR 80 | Ht 72.0 in | Wt 201.6 lb

## 2022-10-20 DIAGNOSIS — G4733 Obstructive sleep apnea (adult) (pediatric): Secondary | ICD-10-CM

## 2022-10-20 DIAGNOSIS — I7143 Infrarenal abdominal aortic aneurysm, without rupture: Secondary | ICD-10-CM

## 2022-10-20 DIAGNOSIS — I495 Sick sinus syndrome: Secondary | ICD-10-CM

## 2022-10-20 DIAGNOSIS — I480 Paroxysmal atrial fibrillation: Secondary | ICD-10-CM

## 2022-10-20 DIAGNOSIS — Z79899 Other long term (current) drug therapy: Secondary | ICD-10-CM

## 2022-10-20 DIAGNOSIS — I7121 Aneurysm of the ascending aorta, without rupture: Secondary | ICD-10-CM

## 2022-10-20 DIAGNOSIS — I5042 Chronic combined systolic (congestive) and diastolic (congestive) heart failure: Secondary | ICD-10-CM | POA: Diagnosis not present

## 2022-10-20 DIAGNOSIS — D6869 Other thrombophilia: Secondary | ICD-10-CM | POA: Diagnosis not present

## 2022-10-20 DIAGNOSIS — R55 Syncope and collapse: Secondary | ICD-10-CM

## 2022-10-20 DIAGNOSIS — E785 Hyperlipidemia, unspecified: Secondary | ICD-10-CM

## 2022-10-20 DIAGNOSIS — Z950 Presence of cardiac pacemaker: Secondary | ICD-10-CM | POA: Diagnosis not present

## 2022-10-20 DIAGNOSIS — Z5181 Encounter for therapeutic drug level monitoring: Secondary | ICD-10-CM | POA: Diagnosis not present

## 2022-10-20 NOTE — Progress Notes (Signed)
Patient ID: Alex Maddox, male   DOB: 05-May-1951, 72 y.o.   MRN: [id number]    Cardiology Office Note    Date:  10/20/2022   ID:  LOWERY PAULLIN, DOB [birth year], MRN [id number]  PCP:  Sharilyn Sites, MD  Cardiologist:   Sanda Klein, MD   Chief Complaint  Patient presents with   Atrial Fibrillation   Congestive Heart Failure   Pacemaker Check    History of Present Illness:  RYKIN ROUTE Legrand Como") is a 72 y.o. male with frequent paroxysmal atrial fibrillation, chronic combined systolic and diastolic heart failure, thoracic and abdominal aortic aneurysm, neurally mediated syncope, tachycardia-bradycardia syndrome and pacemaker (Medtronic Azure with 5086 leads).  The only time he's developed overt heart failure decompensation was during a protracted episode of atrial flutter with 2: 1 AV block and tachycardia in April 2023.  Attempts at overdrive pacing led to conversion atrial fibrillation, but never successfully converted the arrhythmia.  He was started on dofetilide with substantial reduction in the burden of arrhythmia, back to his previous baseline of paroxysmal atrial fibrillation, roughly 10% of the time.  As before, he continues to have frequent episodes of rapid ventricular response, despite being on very high dose beta-blocker.  He has only had 1 syncopal event since his last appointment.  This happened in church after sitting and standing several times.  As before, he recovered very promptly once he was horizontal.  When the average he had 1 or 2 syncopal events a year.  His weight has increased slightly to about 201 pounds, but his wife reports that he is snacking on sweets a lot.  He does not have any lower extremity edema and denies any orthopnea, PND or exertional dyspnea.  He stays physically busy all the time.  He does not have palpitations (he has never been aware of the atrial fibrillation).  He has not had any orthostatic dizziness.  He is compliant with warfarin  anticoagulation and monitoring of his INR and has not had any serious injuries or bleeding problems.  Having a lot of trouble with his CPAP mask, this consistently comes off several times at night and is keeping both him and his wife awake.  He inquires about possibly using an Inspire device.  His current pacemaker was a generator change performed in 2022 and current expected generator longevity is approximately 12 years.  Lead parameters are normal.  Atrial fibrillation burden is 10.7%, comparable to his usual historical burden before he develops the atrial flutter in 2023.  He almost never requires ventricular pacing.  Heart rate histogram distribution of sinus rhythm is normal.  During atrial fibrillation he still has episodes of RVR, but these are generally brief.  Presenting rhythm today is atrial paced, ventricular paced rhythm.  QTc is longer at 521 ms (during ventricular sensed rhythm was 456 ms last September).  Last had potassium level and renal function checked more than 6 months ago, but is scheduled to have labs with Dr. Theador Hawthorne in the nephrology clinic next week.  He has a history of neurally mediated syncope with both cardioinhibitory and vasodepressor components and received a dual-chamber Medtronic MRI conditional permanent pacemaker in August 2012. He has a history of paroxysmal atrial fibrillation and a history of TIA at age 69. He had a traumatic right occipital intracerebral hemorrhage while on warfarin anticoagulation in November 2016. He also has thoracic ascending aortic aneurysm, infrarenal AAA, systemic hypertension and hyperlipidemia. Normal nuclear stress test perfusion pattern in 2012, EF 43%;  similar EF by echocardiography. Echo images are very difficult and follow-up echo in 2015 could not quantify EF. He has never had clinical heart failure. He takes a statin for hyperlipidemia as well as valsartan and carvedilol for both the arrhythmia, the HTN and the cardiomyopathy. He has  ascending aortic enlargement (4.2 cm) and fusiform ectasia of the infrarenal abdominal aorta (max diam 3.0 cm) and CT evidence of coronary calcification.  He developed congestive heart failure during episode of protracted persistent typical atrial flutter with 2: 1 AV conduction in April 2023.  Overdrive pacing was unsuccessful at termination.  Was admitted for dofetilide loading with conversion to sinus/atrial paced rhythm and resolution of heart failure.  Past Medical History:  Diagnosis Date   Abnormal echocardiogram    stress myoview 10/29/2010   Arthritis    " IN MY BACK "   Dyslipidemia    Dysrhythmia    Atrial fibrillation   Hypertension    Paroxysmal atrial fibrillation (HCC)    Presence of permanent cardiac pacemaker    Sinus node dysfunction (HCC)    Sleep apnea, obstructive    sleep study 10/09/2007  AHI 5.73/hr  REM AHI 11.90/hr    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   LEFT THUMB    Stroke [facility])    Syncope    2D Echocardiogram 09/27/2010 EF between 40 to 45%   Tachycardia-bradycardia ([place])    Transient ischemic attack (TIA) 06/07    Past Surgical History:  Procedure Laterality Date   COLONOSCOPY     COLONOSCOPY N/A 06/07/2017   Procedure: COLONOSCOPY;  Surgeon: Rogene Houston, MD;  [facility];  Service: Endoscopy;  Laterality: N/A;  9:30   COLONOSCOPY N/A 09/20/2018   Procedure: COLONOSCOPY;  Surgeon: Rogene Houston, MD;  Location[facility];  Service: Endoscopy;  Laterality: N/A;  [place] / REPLACE / REMOVE PACEMAKER  05/2011   LOOP RECORDER IMPLANT  02/02/2011   Medtronic- CRM    PACEMAKER INSERTION  05/30/2011   Medtronic Revo   last checked 11/07/2012   POLYPECTOMY  09/20/2018   Procedure: POLYPECTOMY;  Surgeon: Rogene Houston, MD;  Location: [facility];  Service: Endoscopy;;  colon   PPM GENERATOR CHANGEOUT N/A 12/30/2020   Procedure: PPM GENERATOR CHANGEOUT;  Surgeon: Sanda Klein, MD;   Location: [place] CV LAB;  Service: Cardiovascular;  Laterality: N/A;    Outpatient Medications Prior to Visit  Medication Sig Dispense Refill   carvedilol (COREG) 25 MG tablet TAKE TWO TABLETS ('50MG'$  TOTAL) BY MOUTH TWO TIMES DAILY WITH A MEAL 120 tablet 6   Cyanocobalamin (B-12 PO) Take 1 capsule by mouth daily.     diazepam (VALIUM) 2 MG tablet Take 2 mg by mouth every 8 (eight) hours as needed for anxiety.      dofetilide (TIKOSYN) 250 MCG capsule TAKE ONE CAPSULE (250MCG TOTAL) BY MOUTHTWO TIMES DAILY 60 capsule 6   finasteride (PROSCAR) 5 MG tablet Take 5 mg by mouth daily.     irbesartan (AVAPRO) 150 MG tablet Take 1 tablet (150 mg total) by mouth daily. 90 tablet 1   Misc Natural Products (NEURIVA) CAPS Take 1 capsule by mouth daily. For memory     Multiple Vitamin (MULTIVITAMIN WITH MINERALS) TABS tablet Take 1 tablet by mouth daily. Adult Multivitamin 50+     rosuvastatin (CRESTOR) 20 MG tablet Take 1 tablet (20 mg total) by mouth daily. 90 tablet 1  Saw Palmetto 450 MG CAPS Take 900 mg by mouth daily.     trospium (SANCTURA) 20 MG tablet Take 20 mg by mouth daily.     warfarin (COUMADIN) 5 MG tablet TAKE ONE-HALF TO ONE TABLET BY MOUTH EVERY DAY AS DIRECTED BY COUMADIN CLINIC 90 tablet 1   Polyethyl Glycol-Propyl Glycol 0.4-0.3 % SOLN Place 1 drop into both eyes 3 (three) times daily as needed (for dry eyes.). (Patient not taking: Reported on 10/20/2022)     No facility-administered medications prior to visit.     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   Right Handed    Lives in a two story home   Social Determinants of Health   Financial Resource Strain: Not on file  Food Insecurity: Not on file  Transportation Needs: Not on file  Physical Activity: Not on file  Stress: Not on file  Social Connections: Not on file     Family History:  The patient's family history includes Heart attack (age of onset: 47) in his father; Hypertension in his brother and mother.   ROS:   Please see the history of present illness.    ROS All other systems are reviewed and are negative.   PHYSICAL EXAM:   VS:  BP 124/74 (BP Location: Left Arm, Patient Position: Sitting, Cuff Size: Normal)   Pulse 80   Ht 6' (1.829 m)   Wt 201 lb 9.6 oz (91.4 kg)   SpO2 97%   BMI 27.34 kg/m      General: Alert, oriented x3, no distress, smiling. Healthy PM site L subclavian area Head: no evidence of trauma, PERRL, EOMI, no exophtalmos or lid lag, no myxedema, no xanthelasma; normal ears, nose and oropharynx Neck: normal jugular venous pulsations and no hepatojugular reflux; brisk carotid pulses without delay and no carotid bruits Chest: clear to auscultation, no signs of consolidation by percussion or palpation, normal fremitus, symmetrical and full respiratory excursions Cardiovascular: normal position and quality of the apical impulse, regular rhythm, normal first and second heart sounds, no murmurs, rubs or gallops Abdomen: no tenderness or distention, no masses by palpation, no abnormal pulsatility or arterial bruits, normal bowel sounds, no hepatosplenomegaly Extremities: no clubbing, cyanosis or edema; 2+ radial, ulnar and brachial pulses bilaterally; 2+ right femoral, posterior tibial and dorsalis pedis pulses; 2+ left femoral, posterior tibial and dorsalis pedis pulses; no  subclavian or femoral bruits Neurological: grossly nonfocal Psych: Normal mood and affect   Wt Readings from Last 3 Encounters:  10/20/22 201 lb 9.6 oz (91.4 kg)  06/23/22 190 lb (86.2 kg)  02/21/22 195 lb 3.2 oz (88.5 kg)      Studies/Labs Reviewed:   EKG:  EKG is not performed today.  The tracing from 02/03/2022 shows AV sequential  pacing with a QRS duration of 130 ms and QTc 568 ms.  Presenting rhythm today was AV sequential pacing on pacemaker interrogation.  CTA 01/27/2021: 1. Stable 4.1 cm ascending thoracic aortic aneurysm (previously 4.2) without complicating features. Recommend annual imaging followup 2. 3.4 cm infrarenal abdominal aortic aneurysm (previously 3.0). Recommend follow-up ultrasound every 3 years. 3. 1.1 cm hypervascular subcapsular lesion in hepatic segment 6, slightly larger than previous studies, nonspecific. Consider elective outpatient liver MR or CT for further characterization.  BMET    Component Value Date/Time   NA 140 06/23/2022 1027   NA 141 05/04/2021 0801   K 4.2 06/23/2022 1027   CL 106 06/23/2022 1027   CO2 31 06/23/2022 1027   GLUCOSE 108 (H) 06/23/2022 1027   BUN 21 06/23/2022 1027   BUN 23 05/04/2021 0801   CREATININE 1.25 (H) 06/23/2022 1027   CREATININE 1.19 01/16/2017 0846   CALCIUM 9.5 06/23/2022 1027   GFRNONAA >60 06/23/2022 1027   GFRAA 75 08/09/2019 0912   Lipid Panel     Component Value Date/Time   CHOL 123 05/04/2021 0801   TRIG 71 05/04/2021 0801   HDL 37 (L) 05/04/2021 0801   CHOLHDL 3.3 05/04/2021 0801   LDLCALC 71 05/04/2021 0801   LABVLDL 15 05/04/2021 0801     ASSESSMENT:    1. Chronic combined systolic and diastolic heart failure (HCC)   2. Paroxysmal atrial fibrillation ([place])   3. Tachycardia-bradycardia syndrome ([place])   4. Acquired thrombophilia ([place])   5. Encounter for monitoring dofetilide therapy   6. Neurocardiogenic syncope   7. Pacemaker   8. Aneurysm of ascending aorta without rupture ([place])    9. Infrarenal abdominal aortic aneurysm (AAA) without rupture ([place])   10. Dyslipidemia (high LDL; low HDL)   11. OSA (obstructive sleep apnea)      PLAN:  In order of problems listed above:  CHF: NYHA functional class I and clinically euvolemic.  He only had heart failure during his episode of persistent atrial flutter with rapid ventricular response.  He is on a very high dose of carvedilol to help with ventricular rate control.  We have had to reduce his dose of irbesartan in halfd due to symptomatic hypotension.  He will not tolerate Entresto.  We discussed SGLT2 inhibitors such as Wilder Glade, but cost is a big issue.  This is why he takes warfarin rather than a direct oral anticoagulant.  In addition, I do have some concern that chronic treatment with medications with a diuretic effect such as Wilder Glade could increase his risk for neurally mediated syncope. PAF: Burden of atrial arrhythmias back to his usual baseline of around 10%, now on dofetilide.  He has not had any of the episodes of atrial flutter with persistent high ventricular rates, but still has RVR fairly frequently despite very high dose carvedilol.  He is asymptomatic from reluctant to add digoxin.  Option to switch to amiodarone in the future if he fails dofetilide.  CHADS Vasc 6(age, TIA 2, HTN, vascular disease, CHF).   Tachy-brady sd: he has required pacing infrequently and his device has generally been motor helpful for reducing the frequency of vasovagal syncope. Warfarin: Compliant with INR monitoring.  In the last 5-monthy checks his INR has been subtherapeutic only once and he has not had any serious bleeding problems Dofetilide: QTc is a little longer today, but this can be attributed to ventricular pacing and paced QRS.  He will have labs checked next week with his nephrologist.  Reminded  him of the risk of drug interactions. Neurally mediated syncope: He is only had 1 event in the last several months and it is important to  try to avoid diuretic use. PPM: Normal device function.  Remote downloads every 3 months..   Asc Ao aneurysm: Asymptomatic, last evaluated in 2022 and has been very stable over the last 4 years; plan to perform CT angiography every 3-5 years.  Reevaluate with CT angiography next year. AAA: Conversely, CT angiography of his abdominal aorta shows a slow steady increase in aneurysm size from 2.8 in 2018-3.0 in 2020, now 3.4 in April 2022.  Duplex ultrasound shows that this is up to 3.7 cm on scan performed in July 2023.  Recheck ultrasound later this year. HTN: Good blood pressure control.  Prefer use of beta-blockers for reduction in the frequency of rapid ventricular rates. Coronary Ca: He has never had angina or frank acute coronary event. He had several normal nuclear stress tests in the past (most recently 2012).  The focus is on risk factor management.  On statin.   HLP: On statin and has an excellent LDL, but his HDL is chronically low.  If he gains more weight this will get even worse.  Encourage more physical activity (walking) and reducing unhealthy foods in his diet such as sweets, starches with high glycemic index, saturated fat. OSA: Having a very hard time with CPAP.  Will reach out to Dr. Claiborne Billings and see if he thinks that he would be a good candidate for an Inspire device.  Medication Adjustments/Labs and Tests Ordered: Current medicines are reviewed at length with the patient today.  Concerns regarding medicines are outlined above.  Medication changes, Labs and Tests ordered today are listed in the Patient Instructions below. Patient Instructions  Medication Instructions:  Your physician recommends that you continue on your current medications as directed. Please refer to the Current Medication list given to you today.  *If you need a refill on your cardiac medications before your next appointment, please call your pharmacy*  Follow-Up: At [facility], you and your health needs  are our priority.  As part of our continuing mission to provide you with exceptional heart care, we have created designated Provider Care Teams.  These Care Teams include your primary Cardiologist (physician) and Advanced Practice Providers (APPs -  Physician Assistants and Nurse Practitioners) who all work together to provide you with the care you need, when you need it.  We recommend signing up for the patient portal called "MyChart".  Sign up information is provided on this After Visit Summary.  MyChart is used to connect with patients for Virtual Visits (Telemedicine).  Patients are able to view lab/test results, encounter notes, upcoming appointments, etc.  Non-urgent messages can be sent to your provider as well.   To learn more about what you can do with MyChart, go to [email or web address].    Your next appointment:   6 month(s)  Provider:   Sanda Klein, MD           Signed, Sanda Klein, MD  10/20/2022 1:50 PM    [address], [place], [place]  [address] Phone: [phone]; Fax: [phone]

## 2022-10-20 NOTE — Progress Notes (Signed)
Remote pacemaker transmission.   

## 2022-10-20 NOTE — Patient Instructions (Signed)
Medication Instructions:  Your physician recommends that you continue on your current medications as directed. Please refer to the Current Medication list given to you today.  *If you need a refill on your cardiac medications before your next appointment, please call your pharmacy*  Follow-Up: At [facility], you and your health needs are our priority.  As part of our continuing mission to provide you with exceptional heart care, we have created designated Provider Care Teams.  These Care Teams include your primary Cardiologist (physician) and Advanced Practice Providers (APPs -  Physician Assistants and Nurse Practitioners) who all work together to provide you with the care you need, when you need it.  We recommend signing up for the patient portal called "MyChart".  Sign up information is provided on this After Visit Summary.  MyChart is used to connect with patients for Virtual Visits (Telemedicine).  Patients are able to view lab/test results, encounter notes, upcoming appointments, etc.  Non-urgent messages can be sent to your provider as well.   To learn more about what you can do with MyChart, go to [email or web address].    Your next appointment:   6 month(s)  Provider:   Sanda Klein, MD

## 2022-10-25 ENCOUNTER — Other Ambulatory Visit: Payer: Self-pay | Admitting: Cardiovascular Disease

## 2022-10-25 ENCOUNTER — Ambulatory Visit: Payer: Medicare Other | Attending: Cardiovascular Disease | Admitting: *Deleted

## 2022-10-25 DIAGNOSIS — Z5181 Encounter for therapeutic drug level monitoring: Secondary | ICD-10-CM

## 2022-10-25 DIAGNOSIS — I4891 Unspecified atrial fibrillation: Secondary | ICD-10-CM | POA: Diagnosis not present

## 2022-10-25 LAB — POCT INR: INR: 1.9 — AB (ref 2.0–3.0)

## 2022-10-25 NOTE — Patient Instructions (Signed)
Increase warfarin to 1 tablet daily except 1/2 tablet on Mondays and Thursdays.  Take at night. Recheck in 6 weeks

## 2022-11-08 DIAGNOSIS — Z79899 Other long term (current) drug therapy: Secondary | ICD-10-CM | POA: Diagnosis not present

## 2022-11-08 DIAGNOSIS — E559 Vitamin D deficiency, unspecified: Secondary | ICD-10-CM | POA: Diagnosis not present

## 2022-11-08 DIAGNOSIS — N1831 Chronic kidney disease, stage 3a: Secondary | ICD-10-CM | POA: Diagnosis not present

## 2022-11-08 DIAGNOSIS — I129 Hypertensive chronic kidney disease with stage 1 through stage 4 chronic kidney disease, or unspecified chronic kidney disease: Secondary | ICD-10-CM | POA: Diagnosis not present

## 2022-11-08 DIAGNOSIS — D696 Thrombocytopenia, unspecified: Secondary | ICD-10-CM | POA: Diagnosis not present

## 2022-11-08 DIAGNOSIS — I5032 Chronic diastolic (congestive) heart failure: Secondary | ICD-10-CM | POA: Diagnosis not present

## 2022-11-15 DIAGNOSIS — N1831 Chronic kidney disease, stage 3a: Secondary | ICD-10-CM | POA: Diagnosis not present

## 2022-11-15 DIAGNOSIS — R809 Proteinuria, unspecified: Secondary | ICD-10-CM | POA: Diagnosis not present

## 2022-11-15 DIAGNOSIS — I5032 Chronic diastolic (congestive) heart failure: Secondary | ICD-10-CM | POA: Diagnosis not present

## 2022-11-15 DIAGNOSIS — G4733 Obstructive sleep apnea (adult) (pediatric): Secondary | ICD-10-CM | POA: Diagnosis not present

## 2022-11-15 DIAGNOSIS — I129 Hypertensive chronic kidney disease with stage 1 through stage 4 chronic kidney disease, or unspecified chronic kidney disease: Secondary | ICD-10-CM | POA: Diagnosis not present

## 2022-11-15 DIAGNOSIS — D696 Thrombocytopenia, unspecified: Secondary | ICD-10-CM | POA: Diagnosis not present

## 2022-11-17 ENCOUNTER — Encounter (HOSPITAL_COMMUNITY): Payer: Self-pay | Admitting: *Deleted

## 2022-11-22 DIAGNOSIS — I700 Atherosclerosis of aorta: Secondary | ICD-10-CM | POA: Diagnosis not present

## 2022-11-22 DIAGNOSIS — I10 Essential (primary) hypertension: Secondary | ICD-10-CM | POA: Diagnosis not present

## 2022-11-22 DIAGNOSIS — I5032 Chronic diastolic (congestive) heart failure: Secondary | ICD-10-CM | POA: Diagnosis not present

## 2022-11-22 DIAGNOSIS — J0100 Acute maxillary sinusitis, unspecified: Secondary | ICD-10-CM | POA: Diagnosis not present

## 2022-12-06 ENCOUNTER — Ambulatory Visit: Payer: Medicare Other | Attending: Cardiovascular Disease | Admitting: Pharmacist

## 2022-12-06 DIAGNOSIS — Z5181 Encounter for therapeutic drug level monitoring: Secondary | ICD-10-CM

## 2022-12-06 DIAGNOSIS — I4891 Unspecified atrial fibrillation: Secondary | ICD-10-CM

## 2022-12-06 LAB — POCT INR: INR: 4.4 — AB (ref 2.0–3.0)

## 2022-12-06 NOTE — Patient Instructions (Signed)
Description   Hold your warfarin today and tomorrow, then continue taking warfarin 1 tablet daily except 1/2 tablet on Mondays and Thursdays. Take at night. Recheck in 2 weeks

## 2022-12-14 DIAGNOSIS — G4733 Obstructive sleep apnea (adult) (pediatric): Secondary | ICD-10-CM | POA: Diagnosis not present

## 2022-12-20 ENCOUNTER — Ambulatory Visit (HOSPITAL_COMMUNITY)
Admission: RE | Admit: 2022-12-20 | Discharge: 2022-12-20 | Disposition: A | Discharge status: Home / Self Care | Payer: Medicare Other | Source: Ambulatory Visit | Attending: Physician Assistant | Admitting: Physician Assistant

## 2022-12-20 VITALS — BP 124/88 | HR 60 | Ht 72.0 in | Wt 197.4 lb

## 2022-12-20 DIAGNOSIS — R0602 Shortness of breath: Secondary | ICD-10-CM | POA: Insufficient documentation

## 2022-12-20 DIAGNOSIS — I5040 Unspecified combined systolic (congestive) and diastolic (congestive) heart failure: Secondary | ICD-10-CM | POA: Diagnosis not present

## 2022-12-20 DIAGNOSIS — I4510 Unspecified right bundle-branch block: Secondary | ICD-10-CM | POA: Insufficient documentation

## 2022-12-20 DIAGNOSIS — Z950 Presence of cardiac pacemaker: Secondary | ICD-10-CM | POA: Diagnosis not present

## 2022-12-20 DIAGNOSIS — I4892 Unspecified atrial flutter: Secondary | ICD-10-CM | POA: Insufficient documentation

## 2022-12-20 DIAGNOSIS — I483 Typical atrial flutter: Secondary | ICD-10-CM | POA: Diagnosis not present

## 2022-12-20 DIAGNOSIS — I4891 Unspecified atrial fibrillation: Secondary | ICD-10-CM | POA: Diagnosis not present

## 2022-12-20 DIAGNOSIS — G4733 Obstructive sleep apnea (adult) (pediatric): Secondary | ICD-10-CM | POA: Diagnosis not present

## 2022-12-20 DIAGNOSIS — Z79899 Other long term (current) drug therapy: Secondary | ICD-10-CM | POA: Insufficient documentation

## 2022-12-20 DIAGNOSIS — I495 Sick sinus syndrome: Secondary | ICD-10-CM | POA: Insufficient documentation

## 2022-12-20 DIAGNOSIS — R5383 Other fatigue: Secondary | ICD-10-CM | POA: Insufficient documentation

## 2022-12-20 DIAGNOSIS — I110 Hypertensive heart disease with heart failure: Secondary | ICD-10-CM | POA: Diagnosis not present

## 2022-12-20 DIAGNOSIS — D6869 Other thrombophilia: Secondary | ICD-10-CM | POA: Diagnosis not present

## 2022-12-20 DIAGNOSIS — R600 Localized edema: Secondary | ICD-10-CM | POA: Diagnosis not present

## 2022-12-20 DIAGNOSIS — I700 Atherosclerosis of aorta: Secondary | ICD-10-CM | POA: Insufficient documentation

## 2022-12-20 DIAGNOSIS — Z7901 Long term (current) use of anticoagulants: Secondary | ICD-10-CM | POA: Insufficient documentation

## 2022-12-20 LAB — BASIC METABOLIC PANEL
Anion gap: 9 (ref 5–15)
BUN: 17 mg/dL (ref 8–23)
CO2: 26 mmol/L (ref 22–32)
Calcium: 9.7 mg/dL (ref 8.9–10.3)
Chloride: 105 mmol/L (ref 98–111)
Creatinine, Ser: 1.15 mg/dL (ref 0.61–1.24)
GFR, Estimated: >60 mL/min (ref >60)
Glucose, Bld: 124 mg/dL — ABNORMAL HIGH (ref 70–99)
Potassium: 3.6 mmol/L (ref 3.5–5.1)
Sodium: 140 mmol/L (ref 135–145)

## 2022-12-20 LAB — MAGNESIUM: Magnesium: 2.1 mg/dL (ref 1.7–2.4)

## 2022-12-20 NOTE — Progress Notes (Signed)
Primary Care Physician: Sharilyn Sites, MD Primary Cardiologist: Dr Sallyanne Kuster Primary Electrophysiologist: none Referring Physician: Dr Levin Bacon Alex Maddox is a 72 y.o. male with a history of OSA, coronary calcifications, CHF, thoracic and abdominal aortic aneurysm, neurally mediated syncope, tachycardia-bradycardia syndrome and pacemaker, atrial fibrillation, atrial flutter who presents for consultation in the [facility]. Patient is on warfarin for a CHADS2VASC score of 4. He was seen by Dr Sallyanne Kuster 01/21/22 in rapid atrial flutter. Overdrive pacing with his device converted him to rate controlled afib. Patient called [facility] with heart rates back up to 140s with some SOB and lower extremity edema.   Pt is here 4/27 for f/u Tikosyn admit.ekg shows av paced rhythm. He feels improved. Qt is at 568 ms but is a paced rhythm and is comparable to paced  ekg's in hospital. Last EKG in hospital was manually corrected to 484 ms with  a qt at 560 ms . He has felt more fatigued sine laving hospital as he has been trying to get use to using cpap.   F/u in afib clinic, 06/23/22 for tikosyn surveillance. He has not noted any afib. He just had a root canal which has affected his eating and overall well being for the last several days. He is being compliant with dofetilide and wafarin with a CHA2DS2VASc  score of  4.  F/u in the afib clinic for Tikosyn surveillance, 12/20/22. EKG shows a paced rhythm with stable qt. No concerns voiced.  Today, he denies symptoms of palpitations, chest pain, shortness of breath, orthopnea, PND, lower extremity edema, dizziness, presyncope, syncope, snoring, daytime somnolence, bleeding, or neurologic sequela. The patient is tolerating medications without difficulties and is otherwise without complaint today.    Atrial Fibrillation Risk Factors:  he does have symptoms or diagnosis of sleep apnea. he is working on starting CPAP. he does not  have a history of rheumatic fever.   he has a BMI of Body mass index is 26.77 kg/m.Marland Kitchen Filed Weights   12/20/22 0938  Weight: 89.5 kg     Family History  Problem Relation Age of Onset   Hypertension Mother    Heart attack Father 69       deceased from heart attack   Hypertension Brother    Colon cancer Neg Hx      Atrial Fibrillation Management history:  Previous antiarrhythmic drugs: none Previous cardioversions: none Previous ablations: none CHADS2VASC score: 4 Anticoagulation history: warfarin    Past Medical History:  Diagnosis Date   Abnormal echocardiogram    stress myoview 10/29/2010   Arthritis    " IN MY BACK "   Dyslipidemia    Dysrhythmia    Atrial fibrillation   Hypertension    Paroxysmal atrial fibrillation (HCC)    Presence of permanent cardiac pacemaker    Sinus node dysfunction (HCC)    Sleep apnea, obstructive    sleep study 10/09/2007  AHI 5.73/hr  REM AHI 11.90/hr    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   LEFT THUMB    Stroke ([place])    Syncope    2D Echocardiogram 09/27/2010 EF between 40 to 45%   Tachycardia-bradycardia ([place])    Transient ischemic attack (TIA) 06/07   Past Surgical History:  Procedure Laterality Date   COLONOSCOPY     COLONOSCOPY N/A 06/07/2017   Procedure: COLONOSCOPY;  Surgeon: Rogene Houston, MD;  [facility];  Service: Endoscopy;  Laterality: N/A;  9:30   COLONOSCOPY N/A 09/20/2018   Procedure: COLONOSCOPY;  Surgeon: Rogene Houston, MD;  [facility];  Service: Endoscopy;  Laterality: N/A;  [place] / REPLACE / REMOVE PACEMAKER  05/2011   LOOP RECORDER IMPLANT  02/02/2011   Medtronic- CRM    PACEMAKER INSERTION  05/30/2011   Medtronic Revo   last checked 11/07/2012   POLYPECTOMY  09/20/2018   Procedure: POLYPECTOMY;  Surgeon: Rogene Houston, MD;  [facility];  Service: Endoscopy;;  colon   PPM GENERATOR CHANGEOUT N/A 12/30/2020    Procedure: PPM GENERATOR CHANGEOUT;  Surgeon: Sanda Klein, MD;  Location: [facility];  Service: Cardiovascular;  Laterality: N/A;    Current Outpatient Medications  Medication Sig Dispense Refill   carvedilol (COREG) 25 MG tablet TAKE TWO TABLETS ('50MG'$  TOTAL) BY MOUTH TWO TIMES DAILY WITH A MEAL 120 tablet 6   Cyanocobalamin (B-12 PO) Take 1 capsule by mouth daily.     diazepam (VALIUM) 2 MG tablet Take 2 mg by mouth every 8 (eight) hours as needed for anxiety.      dofetilide (TIKOSYN) 250 MCG capsule TAKE ONE CAPSULE (250MCG TOTAL) BY MOUTHTWO TIMES DAILY 60 capsule 6   finasteride (PROSCAR) 5 MG tablet Take 5 mg by mouth daily.     irbesartan (AVAPRO) 150 MG tablet Take 1 tablet (150 mg total) by mouth daily. 90 tablet 1   Misc Natural Products (NEURIVA) CAPS Take 1 capsule by mouth daily. For memory     Multiple Vitamin (MULTIVITAMIN WITH MINERALS) TABS tablet Take 1 tablet by mouth daily. Adult Multivitamin 50+     Polyethyl Glycol-Propyl Glycol 0.4-0.3 % SOLN Place 1 drop into both eyes 3 (three) times daily as needed (for dry eyes.).     rosuvastatin (CRESTOR) 20 MG tablet Take 1 tablet (20 mg total) by mouth daily. 90 tablet 1   Saw Palmetto 450 MG CAPS Take 900 mg by mouth daily.     trospium (SANCTURA) 20 MG tablet Take 20 mg by mouth daily.     warfarin (COUMADIN) 5 MG tablet TAKE ONE-HALF TO ONE TABLET BY MOUTH EVERY DAY AS DIRECTED BY COUMADIN CLINIC 90 tablet 1   No current facility-administered medications for this encounter.    Allergies  Allergen Reactions   Apoaequorin Other (See Comments)   Prevnar 13 [Pneumococcal 13-Val Conj Vacc]     Rash, caused him to be unconscious     Social History   Socioeconomic History   Marital status: Married    Spouse name: Not on file   Number of children: 0   Years of education: Not on file   Highest education level: Not on file  Occupational History   Not on file  Tobacco Use   Smoking status: Never   Smokeless  tobacco: Never  Vaping Use   Vaping Use: Never used  Substance and Sexual Activity   Alcohol use: Yes    Comment: occasionally wine   Drug use: No   Sexual activity: Not on file  Other Topics Concern   Not on file  Social History Narrative   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   Right Handed    Lives in a two story home   Social Determinants of Health   Financial Resource Strain: Not on file  Food Insecurity: Not on file  Transportation Needs: Not on file  Physical Activity: Not on file  Stress: Not on  file  Social Connections: Not on file  Intimate Partner Violence: Not on file     ROS- All systems are reviewed and negative except as per the HPI above.  Physical Exam: Vitals:   12/20/22 0938  BP: 124/88  Pulse: 60  Weight: 89.5 kg  Height: 6' (1.829 m)    GEN- The patient is a well appearing male, alert and oriented x 3 today.   Head- normocephalic, atraumatic Eyes-  Sclera clear, conjunctiva pink Ears- hearing intact Oropharynx- clear Neck- supple  Lungs- Clear to ausculation bilaterally, normal work of breathing Heart- irregular rate and rhythm, no murmurs, rubs or gallops  GI- soft, NT, ND, + BS Extremities- no clubbing, cyanosis, or edema MS- no significant deformity or atrophy Skin- no rash or lesion Psych- euthymic mood, full affect Neuro- strength and sensation are intact  Wt Readings from Last 3 Encounters:  12/20/22 89.5 kg  10/20/22 91.4 kg  06/23/22 86.2 kg   EKG-Vent. rate 60 BPM PR interval 196 ms QRS duration 134 ms QT/QTcB 456/456 ms P-R-T axes 40 39 -23 Atrial-paced rhythm Right bundle branch block T wave abnormality, consider inferior ischemia Abnormal ECG When compared with ECG of 23-Jun-2022 10:46, PREVIOUS ECG IS PRESENT     Epic records are reviewed at length today  CHA2DS2-VASc Score = 4  The patient's score is based upon: CHF History: 1 HTN History: 1 Diabetes History: 0 Stroke History: 0 Vascular  Disease History: 1 Age Score: 1 Gender Score: 0       ASSESSMENT AND PLAN: 1. Paroxysmal Atrial Fibrillation/atrial flutter The patient's CHA2DS2-VASc score is 4, indicating a 4.8% annual risk of stroke.   Pt  admitted April 2023 for successful Tikosyn load.  Remains in rhythm, a paced today    Check bmet/mag today  Continue dofetilide 250 mcg bid  Continue warfarin Continue carvedilol 50 mg BID  2. Secondary Hypercoagulable State (ICD10:  D68.69) The patient is at significant risk for stroke/thromboembolism based upon his CHA2DS2-VASc Score of 4.  Continue Warfarin (Coumadin).   3. Tachybradycardia syndrome S/p PPM, followed by Dr Sallyanne Kuster   4. Obstructive sleep apnea Followed by Dr Claiborne Billings  5. Combined systolic and diastolic CHF Stable   6. HTN Stable, no changes today.   F/u  afib clinic in 6 months for tikosyn surveillance    Dr. Loletha Grayer as scheduled   Geroge Baseman. Bodin Gorka, [facility] [address] [place], [place] [address] [phone]

## 2022-12-20 NOTE — Addendum Note (Signed)
Encounter addended by: Sherran Needs, NP on: 12/20/2022 10:18 AM  Actions taken: Clinical Note Signed, Flowsheet accepted, Level of Service modified, Visit diagnoses modified

## 2022-12-21 ENCOUNTER — Other Ambulatory Visit (HOSPITAL_COMMUNITY): Payer: Self-pay | Admitting: *Deleted

## 2022-12-21 DIAGNOSIS — I480 Paroxysmal atrial fibrillation: Secondary | ICD-10-CM

## 2022-12-21 MED ORDER — POTASSIUM CHLORIDE CRYS ER 20 MEQ PO TBCR: 40.0000 meq | EXTENDED_RELEASE_TABLET | Freq: Every day | ORAL | 6 refills | Status: DC

## 2022-12-22 ENCOUNTER — Ambulatory Visit: Payer: Medicare Other | Attending: Cardiology | Admitting: *Deleted

## 2022-12-22 DIAGNOSIS — Z5181 Encounter for therapeutic drug level monitoring: Secondary | ICD-10-CM

## 2022-12-22 DIAGNOSIS — I4891 Unspecified atrial fibrillation: Secondary | ICD-10-CM | POA: Diagnosis not present

## 2022-12-22 LAB — POCT INR: INR: 2.9 (ref 2.0–3.0)

## 2022-12-22 NOTE — Patient Instructions (Signed)
Continue warfarin 1 tablet daily except 1/2 tablet on Mondays and Thursdays. Take at night. Recheck in 4 weeks

## 2022-12-29 ENCOUNTER — Ambulatory Visit (INDEPENDENT_AMBULATORY_CARE_PROVIDER_SITE_OTHER): Payer: Medicare Other

## 2022-12-29 DIAGNOSIS — I495 Sick sinus syndrome: Secondary | ICD-10-CM | POA: Diagnosis not present

## 2022-12-29 LAB — CUP PACEART REMOTE DEVICE CHECK
Battery Remaining Longevity: 143 mo
Battery Voltage: 3.02 V
Brady Statistic AP VP Percent: 19.89 %
Brady Statistic AP VS Percent: 24.52 %
Brady Statistic AS VP Percent: 0.22 %
Brady Statistic AS VS Percent: 55.41 %
Brady Statistic RA Percent Paced: 40.05 %
Brady Statistic RV Percent Paced: 19.05 %
Date Time Interrogation Session: 20240320195731
Implantable Lead Connection Status: 753985
Implantable Lead Connection Status: 753985
Implantable Lead Implant Date: 20120820
Implantable Lead Implant Date: 20120820
Implantable Lead Location: 753859
Implantable Lead Location: 753860
Implantable Pulse Generator Implant Date: 20220323
Lead Channel Impedance Value: 342 Ohm
Lead Channel Impedance Value: 361 Ohm
Lead Channel Impedance Value: 418 Ohm
Lead Channel Impedance Value: 437 Ohm
Lead Channel Pacing Threshold Amplitude: 0.75 V
Lead Channel Pacing Threshold Amplitude: 1.5 V
Lead Channel Pacing Threshold Pulse Width: 0.4 ms
Lead Channel Pacing Threshold Pulse Width: 0.4 ms
Lead Channel Sensing Intrinsic Amplitude: 8.375 mV
Lead Channel Sensing Intrinsic Amplitude: 8.375 mV
Lead Channel Sensing Intrinsic Amplitude: 8.875 mV
Lead Channel Sensing Intrinsic Amplitude: 8.875 mV
Lead Channel Setting Pacing Amplitude: 1.5 V
Lead Channel Setting Pacing Amplitude: 3 V
Lead Channel Setting Pacing Pulse Width: 0.4 ms
Lead Channel Setting Sensing Sensitivity: 0.9 mV
Zone Setting Status: 755011

## 2023-01-19 ENCOUNTER — Ambulatory Visit: Payer: Medicare Other | Attending: Cardiology | Admitting: *Deleted

## 2023-01-19 DIAGNOSIS — I4891 Unspecified atrial fibrillation: Secondary | ICD-10-CM | POA: Diagnosis not present

## 2023-01-19 DIAGNOSIS — Z5181 Encounter for therapeutic drug level monitoring: Secondary | ICD-10-CM | POA: Diagnosis not present

## 2023-01-19 LAB — POCT INR: INR: 2.7 (ref 2.0–3.0)

## 2023-01-19 NOTE — Patient Instructions (Signed)
Continue warfarin 1 tablet daily except 1/2 tablet on Mondays and Thursdays. Take at night. Recheck in 4 weeks 

## 2023-02-01 NOTE — Progress Notes (Signed)
Remote pacemaker transmission.   

## 2023-02-04 ENCOUNTER — Other Ambulatory Visit: Payer: Self-pay | Admitting: Cardiovascular Disease

## 2023-02-13 ENCOUNTER — Telehealth: Payer: Self-pay | Admitting: Cardiovascular Disease

## 2023-02-13 ENCOUNTER — Other Ambulatory Visit: Payer: Self-pay | Admitting: Cardiovascular Disease

## 2023-02-13 NOTE — Telephone Encounter (Signed)
Spoke to patient's wife.She stated husband just left.He was calling to make sure ok to take his heart medications with water tomorrow before he has 1 tooth removed.Advised he should take his heart medications as prescribed.She will call oral surgeon to make him aware.

## 2023-02-13 NOTE — Telephone Encounter (Signed)
Pt c/o medication issue:  1. Name of Medication: dofetilide (TIKOSYN) 250 MCG capsule   2. How are you currently taking this medication (dosage and times per day)? TAKE ONE CAPSULE ( TOTAL) BY MOUTHTWO TIMES DAILY   3. Are you having a reaction (difficulty breathing--STAT)? No  4. What is your medication issue? Pt would like a callback regarding what he should do with this medication since he's having a oral surgery in the morning. Pt was advise to contact office to have them do a medical clearance. Please advise

## 2023-02-16 ENCOUNTER — Ambulatory Visit: Payer: Medicare Other | Attending: Cardiovascular Disease | Admitting: *Deleted

## 2023-02-16 DIAGNOSIS — I4891 Unspecified atrial fibrillation: Secondary | ICD-10-CM

## 2023-02-16 DIAGNOSIS — Z5181 Encounter for therapeutic drug level monitoring: Secondary | ICD-10-CM | POA: Diagnosis not present

## 2023-02-16 LAB — POCT INR: INR: 1.7 — AB (ref 2.0–3.0)

## 2023-02-16 NOTE — Patient Instructions (Signed)
Has been off warfarin since 02/11/23 Restart warfarin 1 tablet daily except 1/2 tablet on Mondays and Thursdays. Take at night. Recheck in 3 weeks

## 2023-03-09 ENCOUNTER — Ambulatory Visit: Payer: Medicare Other | Attending: Cardiovascular Disease | Admitting: *Deleted

## 2023-03-09 DIAGNOSIS — Z5181 Encounter for therapeutic drug level monitoring: Secondary | ICD-10-CM

## 2023-03-09 DIAGNOSIS — R7309 Other abnormal glucose: Secondary | ICD-10-CM | POA: Diagnosis not present

## 2023-03-09 DIAGNOSIS — E559 Vitamin D deficiency, unspecified: Secondary | ICD-10-CM | POA: Diagnosis not present

## 2023-03-09 DIAGNOSIS — E782 Mixed hyperlipidemia: Secondary | ICD-10-CM | POA: Diagnosis not present

## 2023-03-09 DIAGNOSIS — D518 Other vitamin B12 deficiency anemias: Secondary | ICD-10-CM | POA: Diagnosis not present

## 2023-03-09 DIAGNOSIS — I480 Paroxysmal atrial fibrillation: Secondary | ICD-10-CM | POA: Diagnosis not present

## 2023-03-09 DIAGNOSIS — I4891 Unspecified atrial fibrillation: Secondary | ICD-10-CM

## 2023-03-09 DIAGNOSIS — M1991 Primary osteoarthritis, unspecified site: Secondary | ICD-10-CM | POA: Diagnosis not present

## 2023-03-09 DIAGNOSIS — I719 Aortic aneurysm of unspecified site, without rupture: Secondary | ICD-10-CM | POA: Diagnosis not present

## 2023-03-09 DIAGNOSIS — N182 Chronic kidney disease, stage 2 (mild): Secondary | ICD-10-CM | POA: Diagnosis not present

## 2023-03-09 DIAGNOSIS — I5032 Chronic diastolic (congestive) heart failure: Secondary | ICD-10-CM | POA: Diagnosis not present

## 2023-03-09 DIAGNOSIS — I700 Atherosclerosis of aorta: Secondary | ICD-10-CM | POA: Diagnosis not present

## 2023-03-09 DIAGNOSIS — G9332 Myalgic encephalomyelitis/chronic fatigue syndrome: Secondary | ICD-10-CM | POA: Diagnosis not present

## 2023-03-09 DIAGNOSIS — Z0001 Encounter for general adult medical examination with abnormal findings: Secondary | ICD-10-CM | POA: Diagnosis not present

## 2023-03-09 LAB — POCT INR: POC INR: 3.2

## 2023-03-09 NOTE — Patient Instructions (Signed)
Description   Hold warfarin today and then continue to take warfarin 1 tablet daily except for 1/2 a tablet on Monday and Thursdays. Recheck INR in 2 weeks.

## 2023-03-10 DIAGNOSIS — I480 Paroxysmal atrial fibrillation: Secondary | ICD-10-CM | POA: Diagnosis not present

## 2023-03-10 DIAGNOSIS — I10 Essential (primary) hypertension: Secondary | ICD-10-CM | POA: Diagnosis not present

## 2023-03-10 DIAGNOSIS — E782 Mixed hyperlipidemia: Secondary | ICD-10-CM | POA: Diagnosis not present

## 2023-03-13 DIAGNOSIS — I129 Hypertensive chronic kidney disease with stage 1 through stage 4 chronic kidney disease, or unspecified chronic kidney disease: Secondary | ICD-10-CM | POA: Diagnosis not present

## 2023-03-13 DIAGNOSIS — N189 Chronic kidney disease, unspecified: Secondary | ICD-10-CM | POA: Diagnosis not present

## 2023-03-13 DIAGNOSIS — N1831 Chronic kidney disease, stage 3a: Secondary | ICD-10-CM | POA: Diagnosis not present

## 2023-03-13 DIAGNOSIS — Z79899 Other long term (current) drug therapy: Secondary | ICD-10-CM | POA: Diagnosis not present

## 2023-03-13 DIAGNOSIS — D631 Anemia in chronic kidney disease: Secondary | ICD-10-CM | POA: Diagnosis not present

## 2023-03-20 DIAGNOSIS — I480 Paroxysmal atrial fibrillation: Secondary | ICD-10-CM | POA: Diagnosis not present

## 2023-03-20 DIAGNOSIS — N182 Chronic kidney disease, stage 2 (mild): Secondary | ICD-10-CM | POA: Diagnosis not present

## 2023-03-30 ENCOUNTER — Ambulatory Visit: Payer: Medicare Other

## 2023-03-30 DIAGNOSIS — I495 Sick sinus syndrome: Secondary | ICD-10-CM

## 2023-03-30 LAB — CUP PACEART REMOTE DEVICE CHECK
Battery Remaining Longevity: 141 mo
Battery Voltage: 3.02 V
Brady Statistic AP VP Percent: 18.39 %
Brady Statistic AP VS Percent: 18.23 %
Brady Statistic AS VP Percent: 0.2 %
Brady Statistic AS VS Percent: 63.21 %
Brady Statistic RA Percent Paced: 33.48 %
Brady Statistic RV Percent Paced: 17.84 %
Date Time Interrogation Session: 20240620040416
Implantable Lead Connection Status: 753985
Implantable Lead Connection Status: 753985
Implantable Lead Implant Date: 20120820
Implantable Lead Implant Date: 20120820
Implantable Lead Location: 753859
Implantable Lead Location: 753860
Implantable Pulse Generator Implant Date: 20220323
Lead Channel Impedance Value: 342 Ohm
Lead Channel Impedance Value: 361 Ohm
Lead Channel Impedance Value: 418 Ohm
Lead Channel Impedance Value: 456 Ohm
Lead Channel Pacing Threshold Amplitude: 0.75 V
Lead Channel Pacing Threshold Amplitude: 1.375 V
Lead Channel Pacing Threshold Pulse Width: 0.4 ms
Lead Channel Pacing Threshold Pulse Width: 0.4 ms
Lead Channel Sensing Intrinsic Amplitude: 5.25 mV
Lead Channel Sensing Intrinsic Amplitude: 5.25 mV
Lead Channel Sensing Intrinsic Amplitude: 6.375 mV
Lead Channel Sensing Intrinsic Amplitude: 6.375 mV
Lead Channel Setting Pacing Amplitude: 1.5 V
Lead Channel Setting Pacing Amplitude: 2.75 V
Lead Channel Setting Pacing Pulse Width: 0.4 ms
Lead Channel Setting Sensing Sensitivity: 0.9 mV
Zone Setting Status: 755011

## 2023-04-03 ENCOUNTER — Ambulatory Visit: Payer: Medicare Other | Attending: Cardiovascular Disease | Admitting: *Deleted

## 2023-04-03 DIAGNOSIS — Z5181 Encounter for therapeutic drug level monitoring: Secondary | ICD-10-CM | POA: Diagnosis not present

## 2023-04-03 DIAGNOSIS — I4891 Unspecified atrial fibrillation: Secondary | ICD-10-CM | POA: Diagnosis not present

## 2023-04-03 LAB — POCT INR: INR: 3.1 — AB (ref 2.0–3.0)

## 2023-04-03 NOTE — Patient Instructions (Signed)
Decrease warfarin to 1 tablet daily except for 1/2 a tablet on Monday, Wednesdays and Friday. Recheck INR in 4 weeks.

## 2023-04-10 DIAGNOSIS — R739 Hyperglycemia, unspecified: Secondary | ICD-10-CM | POA: Diagnosis not present

## 2023-04-10 DIAGNOSIS — D696 Thrombocytopenia, unspecified: Secondary | ICD-10-CM | POA: Diagnosis not present

## 2023-04-12 DIAGNOSIS — R350 Frequency of micturition: Secondary | ICD-10-CM | POA: Diagnosis not present

## 2023-04-19 NOTE — Progress Notes (Signed)
Remote pacemaker transmission.   

## 2023-04-21 ENCOUNTER — Other Ambulatory Visit (HOSPITAL_COMMUNITY): Payer: Self-pay | Admitting: Emergency Medicine

## 2023-04-21 ENCOUNTER — Ambulatory Visit (HOSPITAL_COMMUNITY)
Admission: RE | Admit: 2023-04-21 | Discharge: 2023-04-21 | Disposition: A | Discharge status: Home / Self Care | Payer: Medicare Other | Source: Ambulatory Visit | Attending: Cardiovascular Disease | Admitting: Cardiovascular Disease

## 2023-04-21 ENCOUNTER — Telehealth: Payer: Self-pay | Admitting: Emergency Medicine

## 2023-04-21 DIAGNOSIS — I714 Abdominal aortic aneurysm, without rupture, unspecified: Secondary | ICD-10-CM

## 2023-04-21 DIAGNOSIS — I7143 Infrarenal abdominal aortic aneurysm, without rupture: Secondary | ICD-10-CM | POA: Diagnosis not present

## 2023-04-21 NOTE — Telephone Encounter (Signed)
Called to go over results for AAA vascular doppler. No answer, left message with a callback number.    The small abdominal aortic aneurysm is stable in size.  Recheck in 12 months    Order placed for a repeat doppler in one year. They will call to schedule this.

## 2023-04-24 ENCOUNTER — Other Ambulatory Visit (HOSPITAL_COMMUNITY): Payer: Self-pay | Admitting: *Deleted

## 2023-04-24 MED ORDER — DOFETILIDE 250 MCG PO CAPS: 250.0000 ug | ORAL_CAPSULE | Freq: Two times a day (BID) | ORAL | 6 refills | Status: DC

## 2023-04-25 NOTE — Progress Notes (Signed)
[facility] [address], [place] [address]   Clinic Day:  04/25/2023  Referring physician: Assunta Found, MD  Patient Care Team: Assunta Found, MD as PCP - General (Family Medicine) Croitoru, Rachelle Hora, MD as PCP - Cardiology (Cardiology) Lisbeth Renshaw, MD as Consulting Physician (Neurosurgery) Glendale Chard, DO as Consulting Physician (Neurology)   ASSESSMENT & PLAN:   Assessment: ***  Plan: ***  No orders of the defined types were placed in this encounter.     Alben Deeds Teague,acting as a Neurosurgeon for Doreatha Massed, MD.,have documented all relevant documentation on the behalf of Doreatha Massed, MD,as directed by  Doreatha Massed, MD while in the presence of Doreatha Massed, MD.   ***  Breese R Teague   7/16/20247:54 PM  CHIEF COMPLAINT/PURPOSE OF CONSULT:   Diagnosis: thrombocytopenia  Current Therapy:  none  HISTORY OF PRESENT ILLNESS:   Alex Maddox is a 72 y.o. male presenting to clinic today for evaluation of thrombocytopenia at the request of Dr. Sherwood Gambler.  Today, he states that he is doing well overall. His appetite level is at ***%. His energy level is at ***%.  CBC from 7/2 found decreased platelets at 101 and CBC from 5/31 found decreased platelets at 113. He was found to have abnormal CBC from 11/08/22 which found decreased platelets at 115, all other components were WNL. Also from 11/08/22 was abnormal protein/creatinine ratio which was elevated at 229.   ***He denies recent chest pain on exertion, shortness of breath on minimal exertion, pre-syncopal episodes, or palpitations. ***He had not noticed any recent bleeding such as epistaxis, hematuria or hematochezia ***The patient denies over the counter NSAID ingestion. He is not *** on antiplatelets agents. His last colonoscopy was *** ***He had no prior history or diagnosis of cancer. He denies any family history of cancer.  *** His age appropriate screening programs  are up-to-date. ***He denies any pica and eats a variety of diet. ***He never donated blood or received blood transfusion. ***The patient was prescribed oral iron supplements and he takes ***  PAST MEDICAL HISTORY:   Past Medical History: Past Medical History:  Diagnosis Date   Abnormal echocardiogram    stress myoview 10/29/2010   Arthritis    " IN MY BACK "   Dyslipidemia    Dysrhythmia    Atrial fibrillation   Hypertension    Paroxysmal atrial fibrillation (HCC)    Presence of permanent cardiac pacemaker    Sinus node dysfunction (HCC)    Sleep apnea, obstructive    sleep study 10/09/2007  AHI 5.73/hr  REM AHI 11.90/hr    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   LEFT THUMB    Stroke (HCC)    Syncope    2D Echocardiogram 09/27/2010 EF between 40 to 45%   Tachycardia-bradycardia (HCC)    Transient ischemic attack (TIA) 06/07    Surgical History: Past Surgical History:  Procedure Laterality Date   COLONOSCOPY     COLONOSCOPY N/A 06/07/2017   Procedure: COLONOSCOPY;  Surgeon: Malissa Hippo, MD;  [facility];  Service: Endoscopy;  Laterality: N/A;  9:30   COLONOSCOPY N/A 09/20/2018   Procedure: COLONOSCOPY;  Surgeon: Malissa Hippo, MD;  [facility];  Service: Endoscopy;  Laterality: N/A;  1030   ELECTROCARDIOGRAM     INSERT / REPLACE / REMOVE PACEMAKER  05/2011   LOOP RECORDER IMPLANT  02/02/2011   Medtronic- CRM    PACEMAKER INSERTION  05/30/2011  Medtronic Revo   last checked 11/07/2012   POLYPECTOMY  09/20/2018   Procedure: POLYPECTOMY;  Surgeon: Malissa Hippo, MD;  [facility];  Service: Endoscopy;;  colon   PPM GENERATOR CHANGEOUT N/A 12/30/2020   Procedure: PPM GENERATOR CHANGEOUT;  Surgeon: Thurmon Fair, MD;  [facility];  Service: Cardiovascular;  Laterality: N/A;    Social History: Social History   Socioeconomic History   Marital status: Married    Spouse name: Not on file   Number of  children: 0   Years of education: Not on file   Highest education level: Not on file  Occupational History   Not on file  Tobacco Use   Smoking status: Never   Smokeless tobacco: Never  Vaping Use   Vaping status: Never Used  Substance and Sexual Activity   Alcohol use: Yes    Comment: occasionally wine   Drug use: No   Sexual activity: Not on file  Other Topics Concern   Not on file  Social History Narrative   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    Lives in a two story home   Social Determinants of Health   Financial Resource Strain: Not on file  Food Insecurity: Not on file  Transportation Needs: Not on file  Physical Activity: Not on file  Stress: Not on file  Social Connections: Not on file  Intimate Partner Violence: Not on file    Family History: Family History  Problem Relation Age of Onset   Hypertension Mother    Heart attack Father 74       deceased from heart attack   Hypertension Brother    Colon cancer Neg Hx     Current Medications:  Current Outpatient Medications:    carvedilol (COREG) 25 MG tablet, TAKE TWO TABLETS (50MG  TOTAL) BY MOUTH TWO TIMES DAILY WITH A MEAL, Disp: 120 tablet, Rfl: 6   Cyanocobalamin (B-12 PO), Take 1 capsule by mouth daily., Disp: , Rfl:    diazepam (VALIUM) 2 MG tablet, Take 2 mg by mouth every 8 (eight) hours as needed for anxiety. , Disp: , Rfl:    dofetilide (TIKOSYN) 250 MCG capsule, Take 1 capsule (250 mcg total) by mouth 2 (two) times daily., Disp: 60 capsule, Rfl: 6   finasteride (PROSCAR) 5 MG tablet, Take 5 mg by mouth daily., Disp: , Rfl:    irbesartan (AVAPRO) 150 MG tablet, TAKE ONE TABLET (150MG  TOTAL) BY MOUTH DAILY, Disp: 90 tablet, Rfl: 1   Misc Natural Products (NEURIVA) CAPS, Take 1 capsule by mouth daily. For memory, Disp: , Rfl:    Multiple Vitamin (MULTIVITAMIN WITH MINERALS) TABS tablet, Take 1 tablet by mouth daily. Adult Multivitamin 50+, Disp: , Rfl:    Polyethyl  Glycol-Propyl Glycol 0.4-0.3 % SOLN, Place 1 drop into both eyes 3 (three) times daily as needed (for dry eyes.)., Disp: , Rfl:    potassium chloride SA (KLOR-CON M) 20 MEQ tablet, Take 2 tablets (40 mEq total) by mouth daily., Disp: 60 tablet, Rfl: 6   rosuvastatin (CRESTOR) 20 MG tablet, TAKE ONE TABLET (20MG  TOTAL) BY MOUTH DAILY, Disp: 90 tablet, Rfl: 1   Saw Palmetto 450 MG CAPS, Take 900 mg by mouth daily., Disp: , Rfl:    trospium (SANCTURA) 20 MG tablet, Take 20 mg by mouth daily., Disp: , Rfl:    warfarin (COUMADIN) 5 MG tablet, TAKE ONE-HALF TO ONE TABLET BY MOUTH EVERY DAY AS DIRECTED BY  COUMADIN CLINIC, Disp: 90 tablet, Rfl: 1   Allergies: Allergies  Allergen Reactions   Apoaequorin Other (See Comments)   Prevnar 13 [Pneumococcal 13-Val Conj Vacc]     Rash, caused him to be unconscious     REVIEW OF SYSTEMS:   Review of Systems - Oncology   VITALS:   There were no vitals taken for this visit.  Wt Readings from Last 3 Encounters:  12/20/22 197 lb 6.4 oz (89.5 kg)  10/20/22 201 lb 9.6 oz (91.4 kg)  06/23/22 190 lb (86.2 kg)    There is no height or weight on file to calculate BMI.   PHYSICAL EXAM:   Physical Exam  LABS:      Latest Ref Rng & Units 12/24/2020    4:06 PM 07/04/2018    4:40 AM 07/02/2018    5:59 AM  CBC  WBC 3.4 - 10.8 x10E3/uL 6.3  9.3  7.3   Hemoglobin 13.0 - 17.7 g/dL 53.6  64.4  03.4   Hematocrit 37.5 - 51.0 % 45.7  45.0  44.3   Platelets 150 - 450 x10E3/uL 133  150  145       Latest Ref Rng & Units 12/20/2022   10:22 AM 06/23/2022   10:27 AM 02/03/2022    3:15 PM  CMP  Glucose 70 - 99 mg/dL 742  595  97   BUN 8 - 23 mg/dL 17  21  25    Creatinine 0.61 - 1.24 mg/dL 6.38  7.56  4.33   Sodium 135 - 145 mmol/L 140  140  139   Potassium 3.5 - 5.1 mmol/L 3.6  4.2  3.9   Chloride 98 - 111 mmol/L 105  106  103   CO2 22 - 32 mmol/L 26  31  28    Calcium 8.9 - 10.3 mg/dL 9.7  9.5  29.5      No results found for: "CEA1", "CEA" / No results  found for: "CEA1", "CEA" No results found for: "PSA1" No results found for: "JOA416" No results found for: "CAN125"  No results found for: "TOTALPROTELP", "ALBUMINELP", "A1GS", "A2GS", "BETS", "BETA2SER", "GAMS", "MSPIKE", "SPEI" No results found for: "TIBC", "FERRITIN", "IRONPCTSAT" No results found for: "LDH"   STUDIES:   VAS [name] AAA DUPLEX  Result Date: 04/21/2023 ABDOMINAL AORTA STUDY Patient Name:  Alex Maddox  Date of Exam:   04/21/2023 Medical Rec #: [id number]      Accession #:    [id number] Date of Birth: May 11, 1951      Patient Gender: M Patient Age:   31 years Exam Location:  [facility] Procedure:      VAS [name] AAA DUPLEX Referring Phys: [facility] CROITORU --------------------------------------------------------------------------------  Indications: Follow up exam for known AAA. Risk Factors: Hypertension, hyperlipidemia, no history of smoking, coronary               artery disease. Limitations: Air/bowel gas.  Comparison Study: On 04/20/22 Abdominal Duplex showed a distal infrarenal                   fusiform AAA measuring 3.6 cm x 3.7 cm. Performing Technologist: Jake Seats RDMS, RVT, RDCS  Examination Guidelines: A complete evaluation includes B-mode imaging, spectral Doppler, color Doppler, and power Doppler as needed of all accessible portions of each vessel. Bilateral testing is considered an integral part of a complete examination. Limited examinations for reoccurring indications may be performed as noted.  Abdominal Aorta Findings: +-------------+-------+----------+----------+---------+--------+--------+ Location     AP (cm)Trans (cm)PSV (cm/s)Waveform  ThrombusComments +-------------+-------+----------+----------+---------+--------+--------+ Proximal     2.10   2.00      60                                  +-------------+-------+----------+----------+---------+--------+--------+ Mid          3.50   3.50      47                                   +-------------+-------+----------+----------+---------+--------+--------+ Distal       2.50   2.80      68                                  +-------------+-------+----------+----------+---------+--------+--------+ RT CIA Prox  1.1    1.2       53        triphasic                 +-------------+-------+----------+----------+---------+--------+--------+ RT CIA Mid                    76        triphasic                 +-------------+-------+----------+----------+---------+--------+--------+ RT CIA Distal                 51        triphasic                 +-------------+-------+----------+----------+---------+--------+--------+ RT EIA Prox                   78        triphasic                 +-------------+-------+----------+----------+---------+--------+--------+ RT EIA Mid                    108       triphasic                 +-------------+-------+----------+----------+---------+--------+--------+ RT EIA Distal1.0    0.9       110       triphasic                 +-------------+-------+----------+----------+---------+--------+--------+ LT CIA Prox  1.1    1.1       75        triphasic                 +-------------+-------+----------+----------+---------+--------+--------+ LT CIA Mid                    85        triphasic                 +-------------+-------+----------+----------+---------+--------+--------+ LT CIA Distal                 68        triphasic                 +-------------+-------+----------+----------+---------+--------+--------+ LT EIA Prox                   59        triphasic                 +-------------+-------+----------+----------+---------+--------+--------+  LT EIA Mid                    85        triphasic                 +-------------+-------+----------+----------+---------+--------+--------+ LT EIA Distal1.0    0.9       81        triphasic                  +-------------+-------+----------+----------+---------+--------+--------+ IVC/Iliac Findings: +--------+------+--------+--------+   IVC   PatentThrombusComments +--------+------+--------+--------+ IVC Proxpatent                 +--------+------+--------+--------+    Summary: Abdominal Aorta: There is evidence of abnormal dilatation of the mid and distal Abdominal aorta. The largest aortic measurement is 3.5 cm. The largest aortic diameter remains essentially unchanged compared to prior exam. Previous diameter measurement was  3.7 cm obtained on 04/20/22. IVC/Iliac: There is no evidence of thrombus involving the IVC.  *See table(s) above for measurements and observations. Suggest follow up study in 12 months.  Electronically signed by Charlton Haws MD on 04/21/2023 at 12:43:35 PM.    Final    CUP PACEART REMOTE DEVICE CHECK  Result Date: 03/30/2023 Scheduled remote reviewed. Normal device function.  1 NSVT showing atrial driven 1:1 522 AF events, longest duration 7hrs , HR's 100-130, burden 8.8%, Warfarin per PA report Next remote 91 days. [place], CVRS

## 2023-04-26 ENCOUNTER — Encounter: Payer: Self-pay | Admitting: Hematology

## 2023-04-26 ENCOUNTER — Inpatient Hospital Stay: Payer: Medicare Other | Attending: Hematology | Admitting: Hematology

## 2023-04-26 ENCOUNTER — Inpatient Hospital Stay: Payer: Medicare Other

## 2023-04-26 VITALS — BP 126/93 | HR 59 | Temp 97.5°F | Resp 18 | Ht 71.0 in | Wt 198.9 lb

## 2023-04-26 DIAGNOSIS — D696 Thrombocytopenia, unspecified: Secondary | ICD-10-CM

## 2023-04-26 LAB — CBC WITH DIFFERENTIAL/PLATELET
Abs Immature Granulocytes: 0.02 10*3/uL (ref 0.00–0.07)
Basophils Absolute: 0 10*3/uL (ref 0.0–0.1)
Basophils Relative: 1 %
Eosinophils Absolute: 0.2 10*3/uL (ref 0.0–0.5)
Eosinophils Relative: 4 %
HCT: 46.8 % (ref 39.0–52.0)
Hemoglobin: 15.5 g/dL (ref 13.0–17.0)
Immature Granulocytes: 0 %
Lymphocytes Relative: 19 %
Lymphs Abs: 1.2 10*3/uL (ref 0.7–4.0)
MCH: 31.4 pg (ref 26.0–34.0)
MCHC: 33.1 g/dL (ref 30.0–36.0)
MCV: 94.7 fL (ref 80.0–100.0)
Monocytes Absolute: 0.7 10*3/uL (ref 0.1–1.0)
Monocytes Relative: 10 %
Neutro Abs: 4.3 10*3/uL (ref 1.7–7.7)
Neutrophils Relative %: 66 %
Platelets: 118 10*3/uL — ABNORMAL LOW (ref 150–400)
RBC: 4.94 MIL/uL (ref 4.22–5.81)
RDW: 12.4 % (ref 11.5–15.5)
WBC: 6.4 10*3/uL (ref 4.0–10.5)
nRBC: 0 % (ref 0.0–0.2)

## 2023-04-26 LAB — HEPATITIS B CORE ANTIBODY, TOTAL: Hep B Core Total Ab: NONREACTIVE

## 2023-04-26 LAB — HEPATITIS C ANTIBODY: HCV Ab: NONREACTIVE

## 2023-04-26 LAB — RETICULOCYTES
Immature Retic Fract: 9.6 % (ref 2.3–15.9)
RBC.: 4.98 MIL/uL (ref 4.22–5.81)
Retic Count, Absolute: 55.8 10*3/uL (ref 19.0–186.0)
Retic Ct Pct: 1.1 % (ref 0.4–3.1)

## 2023-04-26 LAB — HEPATITIS B SURFACE ANTIGEN: Hepatitis B Surface Ag: NONREACTIVE

## 2023-04-26 LAB — LACTATE DEHYDROGENASE: LDH: 139 U/L (ref 98–192)

## 2023-04-26 LAB — HEPATITIS B SURFACE ANTIBODY,QUALITATIVE: Hep B S Ab: NONREACTIVE

## 2023-04-26 NOTE — Patient Instructions (Signed)
You were seen and examined today by Dr. Katragadda. Dr. Katragadda is a hematologist, meaning that he specializes in blood abnormalities. Dr. Katragadda discussed your past medical history, family history of cancers/blood conditions and the events that led to you being here today.  You were referred to Dr. Katragadda due to thrombocytopenia (low platelets).  Dr. Katragadda has recommended additional labs today for further evaluation.  Follow-up as scheduled.  

## 2023-04-27 LAB — RHEUMATOID FACTOR: Rheumatoid fact SerPl-aCnc: <10 IU/mL (ref <14.0)

## 2023-04-27 LAB — KAPPA/LAMBDA LIGHT CHAINS
Kappa free light chain: 19.9 mg/L — ABNORMAL HIGH (ref 3.3–19.4)
Kappa, lambda light chain ratio: 1.54 (ref 0.26–1.65)
Lambda free light chains: 12.9 mg/L (ref 5.7–26.3)

## 2023-04-28 LAB — COPPER, SERUM: Copper: 85 ug/dL (ref 69–132)

## 2023-04-28 LAB — ANTINUCLEAR ANTIBODIES, IFA: ANA Ab, IFA: NEGATIVE

## 2023-04-30 LAB — PROTEIN ELECTROPHORESIS, SERUM
A/G Ratio: 1.2 (ref 0.7–1.7)
Albumin ELP: 3.4 g/dL (ref 2.9–4.4)
Alpha-1-Globulin: 0.2 g/dL (ref 0.0–0.4)
Alpha-2-Globulin: 0.8 g/dL (ref 0.4–1.0)
Beta Globulin: 0.9 g/dL (ref 0.7–1.3)
Gamma Globulin: 0.9 g/dL (ref 0.4–1.8)
Globulin, Total: 2.8 g/dL (ref 2.2–3.9)
Total Protein ELP: 6.2 g/dL (ref 6.0–8.5)

## 2023-04-30 LAB — IMMUNOFIXATION ELECTROPHORESIS
IgA: 254 mg/dL (ref 61–437)
IgG (Immunoglobin G), Serum: 878 mg/dL (ref 603–1613)
IgM (Immunoglobulin M), Srm: 58 mg/dL (ref 20–172)
Total Protein ELP: 6.1 g/dL (ref 6.0–8.5)

## 2023-05-01 ENCOUNTER — Ambulatory Visit: Payer: Medicare Other | Attending: Cardiovascular Disease | Admitting: *Deleted

## 2023-05-01 DIAGNOSIS — I4891 Unspecified atrial fibrillation: Secondary | ICD-10-CM | POA: Diagnosis not present

## 2023-05-01 DIAGNOSIS — Z5181 Encounter for therapeutic drug level monitoring: Secondary | ICD-10-CM

## 2023-05-01 LAB — POCT INR: INR: 2.6 (ref 2.0–3.0)

## 2023-05-01 LAB — METHYLMALONIC ACID, SERUM: Methylmalonic Acid, Quantitative: 126 nmol/L (ref 0–378)

## 2023-05-01 NOTE — Patient Instructions (Signed)
Continue warfarin 1 tablet daily except for 1/2 a tablet on Monday, Wednesdays and Friday. Recheck INR in 4 weeks.

## 2023-05-04 ENCOUNTER — Ambulatory Visit (HOSPITAL_COMMUNITY)
Admission: RE | Admit: 2023-05-04 | Discharge: 2023-05-04 | Disposition: A | Discharge status: Home / Self Care | Payer: Medicare Other | Source: Ambulatory Visit | Attending: Hematology | Admitting: Hematology

## 2023-05-04 DIAGNOSIS — D696 Thrombocytopenia, unspecified: Secondary | ICD-10-CM | POA: Insufficient documentation

## 2023-05-09 DIAGNOSIS — G4733 Obstructive sleep apnea (adult) (pediatric): Secondary | ICD-10-CM | POA: Diagnosis not present

## 2023-05-10 ENCOUNTER — Inpatient Hospital Stay (HOSPITAL_BASED_OUTPATIENT_CLINIC_OR_DEPARTMENT_OTHER): Payer: Medicare Other | Admitting: Oncology

## 2023-05-10 DIAGNOSIS — D696 Thrombocytopenia, unspecified: Secondary | ICD-10-CM

## 2023-05-10 NOTE — Progress Notes (Signed)
[facility] [address] [place] [address]   Clinic Day:  05/17/23   Referring physician: Assunta Found, MD  Patient Care Team: Assunta Found, MD as PCP - General (Family Medicine) Thurmon Fair, MD as PCP - Cardiology (Cardiology) Lisbeth Renshaw, MD as Consulting Physician (Neurosurgery) Glendale Chard, DO as Consulting Physician (Neurology)   ASSESSMENT & PLAN:   Assessment:  1.  Mild thrombocytopenia: - Patient seen at the request of Dr. Phillips Odor for thrombocytopenia. - Recent CBC in May showed platelet count was 101 with normal hemoglobin and white count. - He is on Coumadin for more than 10 years.  He has easy bruising and bleeding as a result.  He also takes saw palmetto for many years.  No recent herbal supplements.  Denies any quinine supplements or tonic water.  No B symptoms.  2.  Social/family history: - Lives with wife at home.  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  Has exposure to chemicals and Roundup.  Non-smoker. - No family history of thrombocytopenia.  Mother had ovarian cancer.  Paternal cousin has lung cancer and paternal uncle had lung cancer.  Plan:  1.  Mild thrombocytopenia: - Lab work from 05/04/2023 showed no evidence of nutritional deficiencies with normal copper and MMA or infection with nonreactive hep B and hep C results.  No evidence of connective tissue disorders with normal ANA and rheumatoid factor.  No evidence of underlying MDS with normal protein electrophoresis and immunofixation.  Reticulocyte count is also normal.  Ultrasound of the spleen is within normal limits. 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  Discussed ITP in detail.  Will continue to monitor lab work every 4 to 6 months.  If platelets consistently drop below 50,000, will discuss treatment options at that time. -Recommend repeat lab work in 4 to 6 months.   PLAN SUMMARY: >> Return to clinic in 4 to 6 months for follow-up  with labs and see a provider.      No orders of the defined types were placed in this encounter.   [name], NP   8/7/20242:31 PM  CHIEF COMPLAINT/PURPOSE OF CONSULT:   Diagnosis: thrombocytopenia  Current Therapy:  none  HISTORY OF PRESENT ILLNESS:   Alex Maddox is a 72 y.o. male presenting to clinic today for follow-up for thrombocytopenia  Today, he states that he is doing well overall. His appetite level is at 100%. His energy level is at 80%.  He is here to review recent lab results.  Continues to notice some easy bruising and bleeding.  He still remains on Coumadin for greater than 12 years for 2 TIAs.  Denies any new medication. He denies taking quinine supplements, tonic water, or eating walnuts. He denies, epistaxis, fever, night sweats.    PAST MEDICAL HISTORY:   Past Medical History: Past Medical History:  Diagnosis Date   Abnormal echocardiogram    stress myoview 10/29/2010   Arthritis    " IN MY BACK "   Dyslipidemia    Dysrhythmia    Atrial fibrillation   Hypertension    Paroxysmal atrial fibrillation (HCC)    Presence of permanent cardiac pacemaker    Sinus node dysfunction (HCC)    Sleep apnea, obstructive    sleep study 10/09/2007  AHI 5.73/hr  REM AHI 11.90/hr    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   LEFT THUMB    Stroke [facility])    Syncope    2D Echocardiogram 09/27/2010  EF between 40 to 45%   Tachycardia-bradycardia (HCC)    Transient ischemic attack (TIA) 06/07    Surgical History: Past Surgical History:  Procedure Laterality Date   COLONOSCOPY     COLONOSCOPY N/A 06/07/2017   Procedure: COLONOSCOPY;  Surgeon: Malissa Hippo, MD;  [facility]: [facility];  Service: Endoscopy;  Laterality: N/A;  9:30   COLONOSCOPY N/A 09/20/2018   Procedure: COLONOSCOPY;  Surgeon: Malissa Hippo, MD;  Location: [facility];  Service: Endoscopy;  Laterality: N/A;  1030   ELECTROCARDIOGRAM     INSERT / REPLACE / REMOVE PACEMAKER  05/2011    LOOP RECORDER IMPLANT  02/02/2011   Medtronic- CRM    PACEMAKER INSERTION  05/30/2011   Medtronic Revo   last checked 11/07/2012   POLYPECTOMY  09/20/2018   Procedure: POLYPECTOMY;  Surgeon: Malissa Hippo, MD;  Location: [facility];  Service: Endoscopy;;  colon   PPM GENERATOR CHANGEOUT N/A 12/30/2020   Procedure: PPM GENERATOR CHANGEOUT;  Surgeon: Thurmon Fair, MD;  Location: [facility];  Service: Cardiovascular;  Laterality: N/A;    Social History: Social History   Socioeconomic History   Marital status: Married    Spouse name: Not on file   Number of children: 0   Years of education: Not on file   Highest education level: Not on file  Occupational History   Not on file  Tobacco Use   Smoking status: Never   Smokeless tobacco: Never  Vaping Use   Vaping status: Never Used  Substance and Sexual Activity   Alcohol use: Yes    Comment: occasionally wine   Drug use: No   Sexual activity: Not on file  Other Topics Concern   Not on file  Social History Narrative   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    Lives in a two story home   Social Determinants of Health   Financial Resource Strain: Not on file  Food Insecurity: Not on file  Transportation Needs: Not on file  Physical Activity: Not on file  Stress: Not on file  Social Connections: Not on file  Intimate Partner Violence: Not on file    Family History: Family History  Problem Relation Age of Onset   Hypertension Mother    Heart attack Father 59       deceased from heart attack   Hypertension Brother    Colon cancer Neg Hx     Current Medications:  Current Outpatient Medications:    carvedilol (COREG) 25 MG tablet, TAKE TWO TABLETS (50MG  TOTAL) BY MOUTH TWO TIMES DAILY WITH A MEAL, Disp: 120 tablet, Rfl: 6   Cyanocobalamin (B-12 PO), Take 1 capsule by mouth daily., Disp: , Rfl:    diazepam (VALIUM) 2 MG tablet, Take 2 mg by mouth every 8 (eight) hours as needed  for anxiety. , Disp: , Rfl:    dofetilide (TIKOSYN) 250 MCG capsule, Take 1 capsule (250 mcg total) by mouth 2 (two) times daily., Disp: 60 capsule, Rfl: 6   finasteride (PROSCAR) 5 MG tablet, Take 5 mg by mouth daily., Disp: , Rfl:    irbesartan (AVAPRO) 150 MG tablet, TAKE ONE TABLET (150MG  TOTAL) BY MOUTH DAILY, Disp: 90 tablet, Rfl: 1   Misc Natural Products (NEURIVA) CAPS, Take 1 capsule by mouth daily. For memory, Disp: , Rfl:    Multiple Vitamin (MULTIVITAMIN WITH MINERALS) TABS tablet, Take 1 tablet by mouth daily. Adult Multivitamin 50+, Disp: , Rfl:  Polyethyl Glycol-Propyl Glycol 0.4-0.3 % SOLN, Place 1 drop into both eyes 3 (three) times daily as needed (for dry eyes.)., Disp: , Rfl:    potassium chloride SA (KLOR-CON M) 20 MEQ tablet, Take 2 tablets (40 mEq total) by mouth daily., Disp: 60 tablet, Rfl: 6   rosuvastatin (CRESTOR) 20 MG tablet, TAKE ONE TABLET (20MG  TOTAL) BY MOUTH DAILY, Disp: 90 tablet, Rfl: 1   Saw Palmetto 450 MG CAPS, Take 900 mg by mouth daily., Disp: , Rfl:    trospium (SANCTURA) 20 MG tablet, Take 20 mg by mouth daily., Disp: , Rfl:    warfarin (COUMADIN) 5 MG tablet, TAKE ONE-HALF TO ONE TABLET BY MOUTH EVERY DAY AS DIRECTED BY COUMADIN CLINIC, Disp: 90 tablet, Rfl: 1   Allergies: Allergies  Allergen Reactions   Apoaequorin Other (See Comments)   Prevnar 13 [Pneumococcal 13-Val Conj Vacc]     Rash, caused him to be unconscious     REVIEW OF SYSTEMS:   Review of Systems  Constitutional:  Positive for fatigue.  Musculoskeletal:  Positive for back pain and flank pain (Left).  Psychiatric/Behavioral:  The patient is nervous/anxious.      VITALS:   There were no vitals taken for this visit.  Wt Readings from Last 3 Encounters:  04/26/23 198 lb 14.4 oz (90.2 kg)  12/20/22 197 lb 6.4 oz (89.5 kg)  10/20/22 201 lb 9.6 oz (91.4 kg)    There is no height or weight on file to calculate BMI.   PHYSICAL EXAM:   Physical Exam Constitutional:       Appearance: Normal appearance.  Cardiovascular:     Rate and Rhythm: Normal rate and regular rhythm.  Pulmonary:     Effort: Pulmonary effort is normal.     Breath sounds: Normal breath sounds.  Abdominal:     General: Bowel sounds are normal.     Palpations: Abdomen is soft.  Musculoskeletal:        General: No swelling. Normal range of motion.  Neurological:     Mental Status: He is alert and oriented to person, place, and time. Mental status is at baseline.     LABS:      Latest Ref Rng & Units 04/26/2023   10:15 AM 12/24/2020    4:06 PM 07/04/2018    4:40 AM  CBC  WBC 4.0 - 10.5 K/uL 6.4  6.3  9.3   Hemoglobin 13.0 - 17.0 g/dL 03.4  74.2  59.5   Hematocrit 39.0 - 52.0 % 46.8  45.7  45.0   Platelets 150 - 400 K/uL 118  133  150       Latest Ref Rng & Units 12/20/2022   10:22 AM 06/23/2022   10:27 AM 02/03/2022    3:15 PM  CMP  Glucose 70 - 99 mg/dL 638  756  97   BUN 8 - 23 mg/dL 17  21  25    Creatinine 0.61 - 1.24 mg/dL 4.33  2.95  1.88   Sodium 135 - 145 mmol/L 140  140  139   Potassium 3.5 - 5.1 mmol/L 3.6  4.2  3.9   Chloride 98 - 111 mmol/L 105  106  103   CO2 22 - 32 mmol/L 26  31  28    Calcium 8.9 - 10.3 mg/dL 9.7  9.5  41.6      No results found for: "CEA1", "CEA" / No results found for: "CEA1", "CEA" No results found for: "PSA1" No results found for: "SAY301"  No results found for: "CAN125"  Lab Results  Component Value Date   TOTALPROTELP 6.2 04/26/2023   TOTALPROTELP 6.1 04/26/2023   ALBUMINELP 3.4 04/26/2023   A1GS 0.2 04/26/2023   A2GS 0.8 04/26/2023   BETS 0.9 04/26/2023   GAMS 0.9 04/26/2023   MSPIKE Not Observed 04/26/2023   SPEI Comment 04/26/2023   No results found for: "TIBC", "FERRITIN", "IRONPCTSAT" Lab Results  Component Value Date   LDH 139 04/26/2023     STUDIES:   US SPLEEN (ABDOMEN LIMITED)  Result Date: 05/04/2023 CLINICAL DATA:  Thrombocytopenia EXAM: ULTRASOUND ABDOMEN LIMITED COMPARISON:  CT abdomen pelvis 01/27/2021  FINDINGS: Spleen measures 11.4 x 5.0 x 11.0 cm. Volume: 329.2 cc. Normal parenchymal echogenicity. IMPRESSION: Spleen measures within normal limits. Electronically Signed   By: Annia Belt M.D.   On: 05/04/2023 13:46   VAS [name] AAA DUPLEX  Result Date: 04/21/2023 ABDOMINAL AORTA STUDY Patient Name:  Alex Maddox  Date of Exam:   04/21/2023 Medical Rec #: [id number]      Accession #:    [id number] Date of Birth: Mar 04, 1951      Patient Gender: M Patient Age:   55 years Exam Location:  [facility] Procedure:      VAS [name] AAA DUPLEX Referring Phys: [facility] CROITORU --------------------------------------------------------------------------------  Indications: Follow up exam for known AAA. Risk Factors: Hypertension, hyperlipidemia, no history of smoking, coronary               artery disease. Limitations: Air/bowel gas.  Comparison Study: On 04/20/22 Abdominal Duplex showed a distal infrarenal                   fusiform AAA measuring 3.6 cm x 3.7 cm. Performing Technologist: Jake Seats RDMS, RVT, RDCS  Examination Guidelines: A complete evaluation includes B-mode imaging, spectral Doppler, color Doppler, and power Doppler as needed of all accessible portions of each vessel. Bilateral testing is considered an integral part of a complete examination. Limited examinations for reoccurring indications may be performed as noted.  Abdominal Aorta Findings: +-------------+-------+----------+----------+---------+--------+--------+ Location     AP (cm)Trans (cm)PSV (cm/s)Waveform ThrombusComments +-------------+-------+----------+----------+---------+--------+--------+ Proximal     2.10   2.00      60                                  +-------------+-------+----------+----------+---------+--------+--------+ Mid          3.50   3.50      47                                  +-------------+-------+----------+----------+---------+--------+--------+ Distal       2.50   2.80      68                                   +-------------+-------+----------+----------+---------+--------+--------+ RT CIA Prox  1.1    1.2       53        triphasic                 +-------------+-------+----------+----------+---------+--------+--------+ RT CIA Mid                    76        triphasic                 +-------------+-------+----------+----------+---------+--------+--------+  RT CIA Distal                 51        triphasic                 +-------------+-------+----------+----------+---------+--------+--------+ RT EIA Prox                   78        triphasic                 +-------------+-------+----------+----------+---------+--------+--------+ RT EIA Mid                    108       triphasic                 +-------------+-------+----------+----------+---------+--------+--------+ RT EIA Distal1.0    0.9       110       triphasic                 +-------------+-------+----------+----------+---------+--------+--------+ LT CIA Prox  1.1    1.1       75        triphasic                 +-------------+-------+----------+----------+---------+--------+--------+ LT CIA Mid                    85        triphasic                 +-------------+-------+----------+----------+---------+--------+--------+ LT CIA Distal                 68        triphasic                 +-------------+-------+----------+----------+---------+--------+--------+ LT EIA Prox                   59        triphasic                 +-------------+-------+----------+----------+---------+--------+--------+ LT EIA Mid                    85        triphasic                 +-------------+-------+----------+----------+---------+--------+--------+ LT EIA Distal1.0    0.9       81        triphasic                 +-------------+-------+----------+----------+---------+--------+--------+ IVC/Iliac Findings: +--------+------+--------+--------+   IVC    PatentThrombusComments +--------+------+--------+--------+ IVC Proxpatent                 +--------+------+--------+--------+    Summary: Abdominal Aorta: There is evidence of abnormal dilatation of the mid and distal Abdominal aorta. The largest aortic measurement is 3.5 cm. The largest aortic diameter remains essentially unchanged compared to prior exam. Previous diameter measurement was  3.7 cm obtained on 04/20/22. IVC/Iliac: There is no evidence of thrombus involving the IVC.  *See table(s) above for measurements and observations. Suggest follow up study in 12 months.  Electronically signed by Charlton Haws MD on 04/21/2023 at 12:43:35 PM.    Final

## 2023-05-18 ENCOUNTER — Ambulatory Visit: Payer: Medicare Other | Attending: Cardiovascular Disease | Admitting: Cardiovascular Disease

## 2023-05-18 ENCOUNTER — Encounter: Payer: Self-pay | Admitting: Cardiovascular Disease

## 2023-05-18 VITALS — BP 110/82 | HR 86 | Ht 71.0 in | Wt 199.6 lb

## 2023-05-18 DIAGNOSIS — I5042 Chronic combined systolic (congestive) and diastolic (congestive) heart failure: Secondary | ICD-10-CM | POA: Diagnosis not present

## 2023-05-18 DIAGNOSIS — E785 Hyperlipidemia, unspecified: Secondary | ICD-10-CM

## 2023-05-18 DIAGNOSIS — Z5181 Encounter for therapeutic drug level monitoring: Secondary | ICD-10-CM | POA: Diagnosis not present

## 2023-05-18 DIAGNOSIS — I2510 Atherosclerotic heart disease of native coronary artery without angina pectoris: Secondary | ICD-10-CM | POA: Diagnosis not present

## 2023-05-18 DIAGNOSIS — I10 Essential (primary) hypertension: Secondary | ICD-10-CM | POA: Diagnosis not present

## 2023-05-18 DIAGNOSIS — I480 Paroxysmal atrial fibrillation: Secondary | ICD-10-CM | POA: Diagnosis not present

## 2023-05-18 DIAGNOSIS — I7121 Aneurysm of the ascending aorta, without rupture: Secondary | ICD-10-CM

## 2023-05-18 DIAGNOSIS — I495 Sick sinus syndrome: Secondary | ICD-10-CM

## 2023-05-18 DIAGNOSIS — Z79899 Other long term (current) drug therapy: Secondary | ICD-10-CM

## 2023-05-18 DIAGNOSIS — G4733 Obstructive sleep apnea (adult) (pediatric): Secondary | ICD-10-CM | POA: Diagnosis not present

## 2023-05-18 DIAGNOSIS — I7143 Infrarenal abdominal aortic aneurysm, without rupture: Secondary | ICD-10-CM | POA: Diagnosis not present

## 2023-05-18 DIAGNOSIS — D6869 Other thrombophilia: Secondary | ICD-10-CM

## 2023-05-18 NOTE — Progress Notes (Signed)
Patient ID: Alex Maddox, male   DOB: Mar 29, 1951, 72 y.o.   MRN: [id number]    Cardiology Office Note    Date:  05/18/2023   ID:  Alex, Maddox 1950/12/18, MRN [id number]  PCP:  Assunta Found, MD  Cardiologist:   Thurmon Fair, MD   Chief Complaint  Patient presents with   Pacemaker Check   Atrial Fibrillation    History of Present Illness:  [name] Needle") is a 72 y.o. male with frequent paroxysmal atrial fibrillation, chronic combined systolic and diastolic heart failure, thoracic and abdominal aortic aneurysm, neurally mediated syncope, tachycardia-bradycardia syndrome and pacemaker (Medtronic Azure with 5086 leads).  Physically active and denies any limitations due to shortness of breath, angina, dizziness or syncope.  He did have a syncopal event a few weeks ago when his wife was trimming his toenails.  On the average she has 1 or 2 syncopal events every year despite the pacemaker.  He has not had lower extremity edema, orthopnea, PND.  He has not had any falls or bleeding problems.  Compliant with INR monitoring.  The only time he's developed overt heart failure decompensation was during a protracted episode of atrial flutter with 2: 1 AV block and tachycardia in April 2023.  Attempts at overdrive pacing led to conversion atrial fibrillation.  He was started on dofetilide with substantial reduction in the burden of arrhythmia, back to his previous baseline of paroxysmal atrial fibrillation, roughly 10% of the time.  As before, he continues to have frequent episodes of rapid ventricular response, despite being on very high dose beta-blocker.  Currently his burden of atrial fibrillation is around 9% and he has occasional ventricular rates in the 101-120 bpm range, despite being on carvedilol 50 mg twice daily.  He is in atrial fibrillation today and is completely oblivious to the arrhythmia.  At rest his heart rate is 90 bpm.  ECG today shows a borderline acceptable QTc at 506  ms (on dofetilide).  He has not gained any additional weight, but he remains in the overweight range with a BMI of almost 28.  He still is eating a lot of sweets and starchy foods and loves burgers.  His wife is trying hard to make him eat healthier.  He is really having a lot of trouble with his CPAP mask and request a referral for an inspire device.  I think he would be a good candidate for this.  His current pacemaker was a generator change performed in 2022 and current expected generator longevity is approximately 11.4 years.  Lead parameters are normal.  Atrial fibrillation burden is 9 %, comparable to his usual historical burden, other than when before he developed the atrial flutter in 2023.  He rarely requires ventricular pacing (18%).  Heart rate histogram distribution of sinus rhythm is normal.  During atrial fibrillation he still has episodes of RVR, but these are generally brief.  He is in atrial fibrillation right now with a heart rate of about 90 at rest.  Most recent metabolic testing in 03/10/2023 shows creatinine 1.29, potassium 4.7.  Most recent hemoglobin A1c was 6.2%, LDL 66, HDL 33, triglycerides 136, total cholesterol 123.  He has a history of neurally mediated syncope with both cardioinhibitory and vasodepressor components and received a dual-chamber Medtronic MRI conditional permanent pacemaker in August 2012. He has a history of paroxysmal atrial fibrillation and a history of TIA at age 35. He had a traumatic right occipital intracerebral hemorrhage while on warfarin  anticoagulation in November 2016. He also has thoracic ascending aortic aneurysm, infrarenal AAA, systemic hypertension and hyperlipidemia. Normal nuclear stress test perfusion pattern in 2012, EF 43%; similar EF by echocardiography. Echo images are very difficult and follow-up echo in 2015 could not quantify EF. He has never had clinical heart failure. He takes a statin for hyperlipidemia as well as valsartan and  carvedilol for both the arrhythmia, the HTN and the cardiomyopathy. He has ascending aortic enlargement (4.2 cm) and fusiform ectasia of the infrarenal abdominal aorta (max diam 3.0 cm) and CT evidence of coronary calcification.  He developed congestive heart failure during episode of protracted persistent typical atrial flutter with 2: 1 AV conduction in April 2023.  Overdrive pacing was unsuccessful at termination.  Was admitted for dofetilide loading with conversion to sinus/atrial paced rhythm and resolution of heart failure.  Past Medical History:  Diagnosis Date   Abnormal echocardiogram    stress myoview 10/29/2010   Arthritis    " IN MY BACK "   Dyslipidemia    Dysrhythmia    Atrial fibrillation   Hypertension    Paroxysmal atrial fibrillation (HCC)    Presence of permanent cardiac pacemaker    Sinus node dysfunction (HCC)    Sleep apnea, obstructive    sleep study 10/09/2007  AHI 5.73/hr  REM AHI 11.90/hr    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   LEFT THUMB    Stroke [facility])    Syncope    2D Echocardiogram 09/27/2010 EF between 40 to 45%   Tachycardia-bradycardia (HCC)    Transient ischemic attack (TIA) 06/07    Past Surgical History:  Procedure Laterality Date   COLONOSCOPY     COLONOSCOPY N/A 06/07/2017   Procedure: COLONOSCOPY;  Surgeon: Malissa Hippo, MD;  Location: [facility];  Service: Endoscopy;  Laterality: N/A;  9:30   COLONOSCOPY N/A 09/20/2018   Procedure: COLONOSCOPY;  Surgeon: Malissa Hippo, MD;  Location: [facility];  Service: Endoscopy;  Laterality: N/A;  1030   ELECTROCARDIOGRAM     INSERT / REPLACE / REMOVE PACEMAKER  05/2011   LOOP RECORDER IMPLANT  02/02/2011   Medtronic- CRM    PACEMAKER INSERTION  05/30/2011   Medtronic Revo   last checked 11/07/2012   POLYPECTOMY  09/20/2018   Procedure: POLYPECTOMY;  Surgeon: Malissa Hippo, MD;  Location: [facility];  Service: Endoscopy;;  colon   PPM GENERATOR CHANGEOUT N/A 12/30/2020    Procedure: PPM GENERATOR CHANGEOUT;  Surgeon: Thurmon Fair, MD;  Location: [facility];  Service: Cardiovascular;  Laterality: N/A;    Outpatient Medications Prior to Visit  Medication Sig Dispense Refill   carvedilol (COREG) 25 MG tablet TAKE TWO TABLETS (50MG  TOTAL) BY MOUTH TWO TIMES DAILY WITH A MEAL 120 tablet 6   Cyanocobalamin (B-12 PO) Take 1 capsule by mouth daily.     diazepam (VALIUM) 2 MG tablet Take 2 mg by mouth every 8 (eight) hours as needed for anxiety.      dofetilide (TIKOSYN) 250 MCG capsule Take 1 capsule (250 mcg total) by mouth 2 (two) times daily. 60 capsule 6   finasteride (PROSCAR) 5 MG tablet Take 5 mg by mouth daily.     irbesartan (AVAPRO) 150 MG tablet TAKE ONE TABLET (150MG  TOTAL) BY MOUTH DAILY 90 tablet 1   Misc Natural Products (NEURIVA) CAPS Take 1 capsule by mouth daily. For memory     Multiple Vitamin (MULTIVITAMIN WITH MINERALS) TABS tablet Take 1  tablet by mouth daily. Adult Multivitamin 50+     Polyethyl Glycol-Propyl Glycol 0.4-0.3 % SOLN Place 1 drop into both eyes 3 (three) times daily as needed (for dry eyes.).     potassium chloride SA (KLOR-CON M) 20 MEQ tablet Take 2 tablets (40 mEq total) by mouth daily. 60 tablet 6   rosuvastatin (CRESTOR) 20 MG tablet TAKE ONE TABLET (20MG  TOTAL) BY MOUTH DAILY 90 tablet 1   Saw Palmetto 450 MG CAPS Take 900 mg by mouth daily.     trospium (SANCTURA) 20 MG tablet Take 20 mg by mouth daily.     warfarin (COUMADIN) 5 MG tablet TAKE ONE-HALF TO ONE TABLET BY MOUTH EVERY DAY AS DIRECTED BY COUMADIN CLINIC 90 tablet 1   No facility-administered medications prior to visit.     Allergies:   Apoaequorin and Prevnar 13 [pneumococcal 13-val conj vacc]   Social History   Socioeconomic History   Marital status: Married    Spouse name: Not on file   Number of children: 0   Years of education: Not on file   Highest education level: Not on file  Occupational History   Not on file  Tobacco Use   Smoking  status: Never   Smokeless tobacco: Never  Vaping Use   Vaping status: Never Used  Substance and Sexual Activity   Alcohol use: Yes    Comment: occasionally wine   Drug use: No   Sexual activity: Not on file  Other Topics Concern   Not on file  Social History Narrative   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    Lives in a two story home   Social Determinants of Health   Financial Resource Strain: Not on file  Food Insecurity: Not on file  Transportation Needs: Not on file  Physical Activity: Not on file  Stress: Not on file  Social Connections: Not on file     Family History:  The patient's family history includes Heart attack (age of onset: 40) in his father; Hypertension in his brother and mother.   ROS:   Please see the history of present illness.    ROS All other systems are reviewed and are negative.   PHYSICAL EXAM:   VS:  BP 110/82 (BP Location: Left Arm, Patient Position: Sitting, Cuff Size: Large)   Pulse 86   Ht 5\' 11"  (1.803 m)   Wt 199 lb 9.6 oz (90.5 kg)   SpO2 96%   BMI 27.84 kg/m      General: Alert, oriented x3, no distress, smiling. Healthy PM site L subclavian area Head: no evidence of trauma, PERRL, EOMI, no exophtalmos or lid lag, no myxedema, no xanthelasma; normal ears, nose and oropharynx Neck: normal jugular venous pulsations and no hepatojugular reflux; brisk carotid pulses without delay and no carotid bruits Chest: clear to auscultation, no signs of consolidation by percussion or palpation, normal fremitus, symmetrical and full respiratory excursions Cardiovascular: normal position and quality of the apical impulse, irregular rhythm, widely split rhythm second heart sound, no murmurs, rubs or gallops Abdomen: no tenderness or distention, no masses by palpation, no abnormal pulsatility or arterial bruits, normal bowel sounds, no hepatosplenomegaly Extremities: no clubbing, cyanosis or edema; 2+ radial, ulnar and  brachial pulses bilaterally; 2+ right femoral, posterior tibial and dorsalis pedis pulses; 2+ left femoral, posterior tibial and dorsalis pedis pulses; no subclavian or femoral bruits Neurological: grossly nonfocal Psych: Normal mood and affect   Wt Readings from Last  3 Encounters:  05/18/23 199 lb 9.6 oz (90.5 kg)  04/26/23 198 lb 14.4 oz (90.2 kg)  12/20/22 197 lb 6.4 oz (89.5 kg)      Studies/Labs Reviewed:   EKG:  EKG is not performed today.  Personally reviewed the tracing from 12/20/2022 which shows atrial paced, ventricular sensed rhythm, RBBB, QTc 456 ms.  Today's intracardiac electrogram shows atrial fibrillation with mostly ventricular sensed rhythm.  CTA 01/27/2021: 1. Stable 4.1 cm ascending thoracic aortic aneurysm (previously 4.2) without complicating features. Recommend annual imaging followup 2. 3.4 cm infrarenal abdominal aortic aneurysm (previously 3.0). Recommend follow-up ultrasound every 3 years. 3. 1.1 cm hypervascular subcapsular lesion in hepatic segment 6, slightly larger than previous studies, nonspecific. Consider elective outpatient liver MR or CT for further characterization.  AAA duplex ultrasound 04/21/2023:  Abdominal Aorta: There is evidence of abnormal dilatation of the mid and  distal Abdominal aorta. The largest aortic measurement is 3.5 cm. The  largest aortic diameter remains essentially unchanged compared to prior  exam. Previous diameter measurement was   3.7 cm obtained on 04/20/22.  IVC/Iliac: There is no evidence of thrombus involving the IVC.   BMET    Component Value Date/Time   NA 140 12/20/2022 1022   NA 141 05/04/2021 0801   K 3.6 12/20/2022 1022   CL 105 12/20/2022 1022   CO2 26 12/20/2022 1022   GLUCOSE 124 (H) 12/20/2022 1022   BUN 17 12/20/2022 1022   BUN 23 05/04/2021 0801   CREATININE 1.15 12/20/2022 1022   CREATININE 1.19 01/16/2017 0846   CALCIUM 9.7 12/20/2022 1022   GFRNONAA >60 12/20/2022 1022   GFRAA 75 08/09/2019  0912   Lipid Panel     Component Value Date/Time   CHOL 123 05/04/2021 0801   TRIG 71 05/04/2021 0801   HDL 37 (L) 05/04/2021 0801   CHOLHDL 3.3 05/04/2021 0801   LDLCALC 71 05/04/2021 0801   LABVLDL 15 05/04/2021 0801     ASSESSMENT:    1. Chronic combined systolic and diastolic heart failure (HCC)   2. Paroxysmal atrial fibrillation (HCC)   3. Tachycardia-bradycardia syndrome (HCC)   4. Acquired thrombophilia (HCC)   5. Encounter for monitoring dofetilide therapy   6. Aneurysm of ascending aorta without rupture (HCC)   7. Infrarenal abdominal aortic aneurysm (AAA) without rupture (HCC)   8. Essential hypertension   9. Coronary artery calcification seen on CT scan   10. Dyslipidemia (high LDL; low HDL)   11. Obstructive sleep apnea syndrome       PLAN:  In order of problems listed above:  CHF: Asymptomatic and clinically euvolemic.  Most recent LVEF 45%.  Prefer to avoid diuretics due to his history of neurally mediated syncope.  He only had heart failure during his episode of persistent atrial flutter with rapid ventricular response.  He is on a very high dose of carvedilol to help with ventricular rate control.  We have had to reduce his dose of irbesartan in halfd due to symptomatic hypotension.  He will not tolerate Entresto.  Jested SGLT2 inhibitors, but cost is an issue and it may even increase his risk of neurally mediated syncope. PAFib: Low burden of arrhythmia around 10%, on dofetilide and carvedilol 50 mg twice daily.Marland Kitchen  He has not had any of the episodes of atrial flutter with persistent high ventricular rates, but still has RVR fairly frequently despite very high dose carvedilol.  Option to switch to amiodarone in the future if he fails dofetilide.  CHADS  Vasc 6(age, TIA 2, HTN, vascular disease, CHF).   Tachy-brady sd: He rarely requires pacing and his device has generally been more helpful for reducing the frequency of vasovagal syncope. Warfarin: Compliant with  INR monitoring.  In the last 12 months INR range has been 1.7-4.4.  No bleeding problems.  Discussed dietary vitamin K impact on his INR. Dofetilide: QTc borderline acceptable on dofetilide.  Reviewed the importance of being aware of drug interactions.  Repeat basic metabolic panel when he goes to the A-fib clinic in September. Neurally mediated syncope: Infrequent.  Avoid diuretics. PPM: Normal device function.  Remote downloads every 3 months..   Asc Ao aneurysm: Asymptomatic, last evaluated in 2022 and has been very stable over the last 4 years; plan to perform CT angiography every 3-5 years.  Reevaluate with CT angiography of the entire aorta next year. AAA: This has increased slowly from 2.8 in 2018 to 3.0 in 2020, 3.4 in April 2022, 3.7 cm on scan performed in July 2023, probably stable in size since we measured smaller this year at 3.5 cm on 04/21/2023 will check his entire aorta by CT angiography in 2025 HTN: Using very high dose of beta-blocker for good control of ventricular rates during atrial fibrillation.  Had to decrease his dose of ARB.  He will not tolerate Entresto (note history of vasovagal syncope). Coronary Ca: Remains asymptomatic.  He has never had angina or frank acute coronary event. He had several normal nuclear stress tests in the past (most recently 2012).  The focus is on risk factor management.  On statin.   HLP: We spent a long time today discussing the importance of increasing healthy unsaturated fat and lean protein in his diet and decreasing animal fat, sweets, starches with high glycemic index.  His HDL is chronically quite low. OSA: Having a very hard time with CPAP.  I think he would be a good candidate for an Inspire device BMI is only about 28.  Will refer to Dr. Jenne Pane.  Medication Adjustments/Labs and Tests Ordered: Current medicines are reviewed at length with the patient today.  Concerns regarding medicines are outlined above.  Medication changes, Labs and Tests  ordered today are listed in the Patient Instructions below. Patient Instructions  Medication Instructions:    Your physician recommends that you continue on your current medications as directed. Please refer to the Current Medication list given to you today.   *If you need a refill on your cardiac medications before your next appointment, please call your pharmacy*   Lab Work:  NONE ORDERED  TODAY    If you have labs (blood work) drawn today and your tests are completely normal, you will receive your results only by: MyChart Message (if you have MyChart) OR A paper copy in the mail If you have any lab test that is abnormal or we need to change your treatment, we will call you to review the results.   Testing/Procedures: NONE ORDERED  TODAY    Follow-Up: At [facility], you and your health needs are our priority.  As part of our continuing mission to provide you with exceptional heart care, we have created designated Provider Care Teams.  These Care Teams include your primary Cardiologist (physician) and Advanced Practice Providers (APPs -  Physician Assistants and Nurse Practitioners) who all work together to provide you with the care you need, when you need it.  We recommend signing up for the patient portal called "MyChart".  Sign up information is provided  on this After Visit Summary.  MyChart is used to connect with patients for Virtual Visits (Telemedicine).  Patients are able to view lab/test results, encounter notes, upcoming appointments, etc.  Non-urgent messages can be sent to your provider as well.   To learn more about what you can do with MyChart, go to [email or web address].    Your next appointment:  You have been referred to DR BATES, EAR NOSE AND THROAT SPECIALIST FOR INSPIRE SET UP     NEXT AVAILABLE DR Tresa Endo  SLEEP FOLLOW UP  5 month(s)  Provider:   Thurmon Fair, MD     Other Instructions      Signed, Thurmon Fair, MD  05/18/2023 1:46 PM     [facility] [address] [place], [place], [place]  [address] Phone: [phone]; Fax: [phone]

## 2023-05-18 NOTE — Patient Instructions (Addendum)
Medication Instructions:    Your physician recommends that you continue on your current medications as directed. Please refer to the Current Medication list given to you today.   *If you need a refill on your cardiac medications before your next appointment, please call your pharmacy*   Lab Work:  NONE ORDERED  TODAY    If you have labs (blood work) drawn today and your tests are completely normal, you will receive your results only by: MyChart Message (if you have MyChart) OR A paper copy in the mail If you have any lab test that is abnormal or we need to change your treatment, we will call you to review the results.   Testing/Procedures: NONE ORDERED  TODAY    Follow-Up: At [facility], you and your health needs are our priority.  As part of our continuing mission to provide you with exceptional heart care, we have created designated Provider Care Teams.  These Care Teams include your primary Cardiologist (physician) and Advanced Practice Providers (APPs -  Physician Assistants and Nurse Practitioners) who all work together to provide you with the care you need, when you need it.  We recommend signing up for the patient portal called "MyChart".  Sign up information is provided on this After Visit Summary.  MyChart is used to connect with patients for Virtual Visits (Telemedicine).  Patients are able to view lab/test results, encounter notes, upcoming appointments, etc.  Non-urgent messages can be sent to your provider as well.   To learn more about what you can do with MyChart, go to [email or web address].    Your next appointment:  You have been referred to DR BATES, EAR NOSE AND THROAT SPECIALIST FOR INSPIRE SET UP     NEXT AVAILABLE DR Tresa Endo  SLEEP FOLLOW UP  5 month(s)  Provider:   Thurmon Fair, MD     Other Instructions

## 2023-05-26 DIAGNOSIS — J0100 Acute maxillary sinusitis, unspecified: Secondary | ICD-10-CM | POA: Diagnosis not present

## 2023-05-30 ENCOUNTER — Ambulatory Visit: Payer: Medicare Other | Attending: Cardiovascular Disease | Admitting: *Deleted

## 2023-05-30 DIAGNOSIS — Z5181 Encounter for therapeutic drug level monitoring: Secondary | ICD-10-CM | POA: Diagnosis not present

## 2023-05-30 DIAGNOSIS — I4891 Unspecified atrial fibrillation: Secondary | ICD-10-CM

## 2023-05-30 LAB — POCT INR: INR: 2.6 (ref 2.0–3.0)

## 2023-05-30 NOTE — Patient Instructions (Signed)
Continue warfarin 1 tablet daily except for 1/2 a tablet on Monday, Wednesdays and Friday. Recheck INR in 6 weeks.

## 2023-06-01 ENCOUNTER — Other Ambulatory Visit: Payer: Self-pay | Admitting: Cardiovascular Disease

## 2023-06-20 ENCOUNTER — Other Ambulatory Visit: Payer: Self-pay | Admitting: Cardiovascular Disease

## 2023-06-21 NOTE — Telephone Encounter (Signed)
Refill request for warfarin:  Last INR was 2.6 on 05/30/23 Next INR due 07/11/23 LOV was 05/18/23  Refill approved.

## 2023-06-28 ENCOUNTER — Encounter (HOSPITAL_COMMUNITY): Payer: Self-pay | Admitting: Physician Assistant

## 2023-06-28 ENCOUNTER — Ambulatory Visit (HOSPITAL_COMMUNITY)
Admission: RE | Admit: 2023-06-28 | Discharge: 2023-06-28 | Disposition: A | Discharge status: Home / Self Care | Payer: Medicare Other | Source: Ambulatory Visit | Attending: Physician Assistant | Admitting: Physician Assistant

## 2023-06-28 VITALS — BP 118/86 | HR 60 | Ht 71.0 in | Wt 200.4 lb

## 2023-06-28 DIAGNOSIS — I2510 Atherosclerotic heart disease of native coronary artery without angina pectoris: Secondary | ICD-10-CM | POA: Insufficient documentation

## 2023-06-28 DIAGNOSIS — I483 Typical atrial flutter: Secondary | ICD-10-CM | POA: Diagnosis not present

## 2023-06-28 DIAGNOSIS — D6869 Other thrombophilia: Secondary | ICD-10-CM | POA: Diagnosis not present

## 2023-06-28 DIAGNOSIS — I495 Sick sinus syndrome: Secondary | ICD-10-CM | POA: Diagnosis not present

## 2023-06-28 DIAGNOSIS — I480 Paroxysmal atrial fibrillation: Secondary | ICD-10-CM | POA: Diagnosis not present

## 2023-06-28 DIAGNOSIS — I5040 Unspecified combined systolic (congestive) and diastolic (congestive) heart failure: Secondary | ICD-10-CM | POA: Diagnosis not present

## 2023-06-28 DIAGNOSIS — I110 Hypertensive heart disease with heart failure: Secondary | ICD-10-CM | POA: Insufficient documentation

## 2023-06-28 DIAGNOSIS — Z79899 Other long term (current) drug therapy: Secondary | ICD-10-CM | POA: Insufficient documentation

## 2023-06-28 DIAGNOSIS — Z5181 Encounter for therapeutic drug level monitoring: Secondary | ICD-10-CM | POA: Diagnosis not present

## 2023-06-28 DIAGNOSIS — G4733 Obstructive sleep apnea (adult) (pediatric): Secondary | ICD-10-CM | POA: Insufficient documentation

## 2023-06-28 DIAGNOSIS — I4510 Unspecified right bundle-branch block: Secondary | ICD-10-CM | POA: Diagnosis not present

## 2023-06-28 DIAGNOSIS — Z7901 Long term (current) use of anticoagulants: Secondary | ICD-10-CM | POA: Diagnosis not present

## 2023-06-28 DIAGNOSIS — Z950 Presence of cardiac pacemaker: Secondary | ICD-10-CM | POA: Diagnosis not present

## 2023-06-28 LAB — BASIC METABOLIC PANEL WITH GFR
Anion gap: 7 (ref 5–15)
BUN: 17 mg/dL (ref 8–23)
CO2: 24 mmol/L (ref 22–32)
Calcium: 9.5 mg/dL (ref 8.9–10.3)
Chloride: 106 mmol/L (ref 98–111)
Creatinine, Ser: 1.1 mg/dL (ref 0.61–1.24)
GFR, Estimated: >60 mL/min (ref >60)
Glucose, Bld: 126 mg/dL — ABNORMAL HIGH (ref 70–99)
Potassium: 3.6 mmol/L (ref 3.5–5.1)
Sodium: 137 mmol/L (ref 135–145)

## 2023-06-28 LAB — MAGNESIUM: Magnesium: 2.2 mg/dL (ref 1.7–2.4)

## 2023-06-28 NOTE — Progress Notes (Signed)
Primary Care Physician: Assunta Found, MD Primary Cardiologist: Dr Royann Shivers Primary Electrophysiologist: none Referring Physician: Dr Odella Aquas Alex Maddox is a 72 y.o. male with a history of OSA, coronary calcifications, CHF, thoracic and abdominal aortic aneurysm, neurally mediated syncope, tachycardia-bradycardia syndrome s/p pacemaker, atrial fibrillation, atrial flutter who presents for follow up in the [facility]. Patient is on warfarin for a CHADS2VASC score of 4. He was seen by Dr Royann Shivers 01/21/22 in rapid atrial flutter. Overdrive pacing with his device converted him to rate controlled afib. Patient called [facility] with heart rates back up to 140s with some SOB and lower extremity edema. He is s/p dofetilide loading 01/2022.  On follow up today, patient reports that he has done well since his last visit. He denies any interim symptoms of afib. No bleeding issues on anticoagulation.   Today, he denies symptoms of palpitations, chest pain, shortness of breath, orthopnea, PND, lower extremity edema, dizziness, presyncope, syncope, snoring, daytime somnolence, bleeding, or neurologic sequela. The patient is tolerating medications without difficulties and is otherwise without complaint today.    Atrial Fibrillation Risk Factors:  he does have symptoms or diagnosis of sleep apnea. he is compliant with CPAP he does not have a history of rheumatic fever.   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     diazepam (VALIUM) 2 MG tablet Take 2 mg by mouth every 8 (eight) hours as needed for anxiety.      dofetilide (TIKOSYN) 250 MCG capsule Take 1 capsule (250 mcg total) by mouth 2 (two) times daily. 60 capsule 6   finasteride (PROSCAR) 5 MG tablet Take 5 mg by mouth daily.     irbesartan (AVAPRO) 150 MG tablet TAKE ONE TABLET (150MG  TOTAL) BY MOUTH DAILY 90 tablet 1   Misc Natural Products (NEURIVA) CAPS Take 1 capsule by mouth daily. For memory     Multiple Vitamin (MULTIVITAMIN WITH MINERALS) TABS tablet Take 1 tablet by mouth daily. Adult Multivitamin 50+     Polyethyl Glycol-Propyl Glycol 0.4-0.3 % SOLN Place 1 drop into both eyes 3 (three) times daily as needed (for dry eyes.).     potassium chloride (KLOR-CON) 10 MEQ tablet Take 10 mEq by mouth 4 (four) times daily.     rosuvastatin (CRESTOR) 20 MG tablet TAKE ONE TABLET (20MG  TOTAL) BY MOUTH DAILY 90 tablet 1  Saw Palmetto 450 MG CAPS Take 900 mg by mouth daily.     trospium (SANCTURA) 20 MG tablet Take 20 mg by mouth daily.     warfarin (COUMADIN) 5 MG tablet TAKE ONE-HALF TO ONE TABLET BY MOUTH EVERY DAY AS DIRECTED BY COUMADIN CLINIC 90 tablet 1   No current facility-administered medications for this encounter.    ROS- All systems are reviewed and negative except as per the HPI above.  Physical Exam: Vitals:   06/28/23 1004  BP: 118/86  Pulse: 60  Weight: 90.9 kg  Height: 5\' 11"  (1.803 m)     GEN:  Well nourished, well developed in no acute distress NECK: No JVD; No carotid bruits CARDIAC: Regular rate and rhythm, no murmurs, rubs, gallops RESPIRATORY:  Clear to auscultation without rales, wheezing or rhonchi  ABDOMEN: Soft, non-tender, non-distended EXTREMITIES:  No edema; No deformity    Wt Readings from Last 3 Encounters:  06/28/23 90.9 kg  05/18/23 90.5 kg  04/26/23 90.2 kg    EKG today demonstrates A paced rhythm, RBBB Vent. rate 60 BPM PR interval 202 ms QRS duration 132 ms QT/QTcB 482/482 ms     Epic records are reviewed at length today  CHA2DS2-VASc Score = 4  The patient's score is based upon: CHF History: 1 HTN History: 1 Diabetes History: 0 Stroke History: 0 Vascular Disease History: 1 Age Score: 1 Gender Score: 0        ASSESSMENT AND PLAN: Paroxysmal Atrial Fibrillation/atrial flutter The patient's CHA2DS2-VASc score is 4, indicating a 4.8% annual risk of stroke.   S/p dofetilide loading 01/2022 Afib burden 9% on last device interrogation   Check bmet/mag today  Continue dofetilide 250 mcg BID, QT stable Continue warfarin Continue carvedilol 50 mg BID  Secondary Hypercoagulable State (ICD10:  D68.69) The patient is at significant risk for stroke/thromboembolism based upon his CHA2DS2-VASc Score of 4.  Continue Warfarin (Coumadin).   Tachybradycardia syndrome S/p PPM, followed by Dr Royann Shivers   OSA  Encouraged nightly CPAP Patient does have issues with his mask staying on while he is sleeping. He plans to discuss with Dr Tresa Endo at his upcoming appointment.   Combined systolic and diastolic CHF EF 40% Fluid status appears stable  HTN Stable on current regimen   Follow up in the AF clinic in 6 months.    [name] PA-C Afib Clinic [facility] [address] [place], [place] [address] [phone]

## 2023-06-29 ENCOUNTER — Encounter (HOSPITAL_COMMUNITY): Payer: Self-pay

## 2023-06-29 ENCOUNTER — Ambulatory Visit (INDEPENDENT_AMBULATORY_CARE_PROVIDER_SITE_OTHER): Payer: Medicare Other

## 2023-06-29 DIAGNOSIS — I495 Sick sinus syndrome: Secondary | ICD-10-CM

## 2023-06-29 LAB — CUP PACEART REMOTE DEVICE CHECK
Battery Remaining Longevity: 136 mo
Battery Voltage: 3.01 V
Brady Statistic AP VP Percent: 18.59 %
Brady Statistic AP VS Percent: 27.46 %
Brady Statistic AS VP Percent: 0.14 %
Brady Statistic AS VS Percent: 53.84 %
Brady Statistic RA Percent Paced: 42.63 %
Brady Statistic RV Percent Paced: 17.91 %
Date Time Interrogation Session: 20240919052220
Implantable Lead Connection Status: 753985
Implantable Lead Connection Status: 753985
Implantable Lead Implant Date: 20120820
Implantable Lead Implant Date: 20120820
Implantable Lead Location: 753859
Implantable Lead Location: 753860
Implantable Pulse Generator Implant Date: 20220323
Lead Channel Impedance Value: 361 Ohm
Lead Channel Impedance Value: 380 Ohm
Lead Channel Impedance Value: 456 Ohm
Lead Channel Impedance Value: 456 Ohm
Lead Channel Pacing Threshold Amplitude: 0.75 V
Lead Channel Pacing Threshold Amplitude: 1.5 V
Lead Channel Pacing Threshold Pulse Width: 0.4 ms
Lead Channel Pacing Threshold Pulse Width: 0.4 ms
Lead Channel Sensing Intrinsic Amplitude: 7.125 mV
Lead Channel Sensing Intrinsic Amplitude: 7.125 mV
Lead Channel Sensing Intrinsic Amplitude: 9.125 mV
Lead Channel Sensing Intrinsic Amplitude: 9.125 mV
Lead Channel Setting Pacing Amplitude: 1.5 V
Lead Channel Setting Pacing Amplitude: 3 V
Lead Channel Setting Pacing Pulse Width: 0.4 ms
Lead Channel Setting Sensing Sensitivity: 0.9 mV
Zone Setting Status: 755011

## 2023-07-03 ENCOUNTER — Other Ambulatory Visit (HOSPITAL_COMMUNITY): Payer: Self-pay | Admitting: *Deleted

## 2023-07-03 DIAGNOSIS — I480 Paroxysmal atrial fibrillation: Secondary | ICD-10-CM

## 2023-07-03 MED ORDER — POTASSIUM CHLORIDE ER 10 MEQ PO TBCR: 30.0000 meq | EXTENDED_RELEASE_TABLET | Freq: Two times a day (BID) | ORAL | 6 refills | Status: DC

## 2023-07-11 ENCOUNTER — Ambulatory Visit: Payer: Medicare Other | Attending: Cardiovascular Disease | Admitting: *Deleted

## 2023-07-11 DIAGNOSIS — I4891 Unspecified atrial fibrillation: Secondary | ICD-10-CM | POA: Diagnosis not present

## 2023-07-11 DIAGNOSIS — Z5181 Encounter for therapeutic drug level monitoring: Secondary | ICD-10-CM

## 2023-07-11 LAB — POCT INR: INR: 2.1 (ref 2.0–3.0)

## 2023-07-11 NOTE — Progress Notes (Signed)
Remote pacemaker transmission.   

## 2023-07-11 NOTE — Patient Instructions (Signed)
Continue warfarin 1 tablet daily except for 1/2 a tablet on Monday, Wednesdays and Friday. Recheck INR in 6 weeks.

## 2023-07-17 DIAGNOSIS — N189 Chronic kidney disease, unspecified: Secondary | ICD-10-CM | POA: Diagnosis not present

## 2023-07-17 DIAGNOSIS — N182 Chronic kidney disease, stage 2 (mild): Secondary | ICD-10-CM | POA: Diagnosis not present

## 2023-07-17 DIAGNOSIS — I5032 Chronic diastolic (congestive) heart failure: Secondary | ICD-10-CM | POA: Diagnosis not present

## 2023-07-17 DIAGNOSIS — R809 Proteinuria, unspecified: Secondary | ICD-10-CM | POA: Diagnosis not present

## 2023-07-17 DIAGNOSIS — E119 Type 2 diabetes mellitus without complications: Secondary | ICD-10-CM | POA: Diagnosis not present

## 2023-07-24 DIAGNOSIS — R809 Proteinuria, unspecified: Secondary | ICD-10-CM | POA: Diagnosis not present

## 2023-07-24 DIAGNOSIS — I129 Hypertensive chronic kidney disease with stage 1 through stage 4 chronic kidney disease, or unspecified chronic kidney disease: Secondary | ICD-10-CM | POA: Diagnosis not present

## 2023-07-24 DIAGNOSIS — I5032 Chronic diastolic (congestive) heart failure: Secondary | ICD-10-CM | POA: Diagnosis not present

## 2023-07-24 DIAGNOSIS — D696 Thrombocytopenia, unspecified: Secondary | ICD-10-CM | POA: Diagnosis not present

## 2023-07-26 ENCOUNTER — Other Ambulatory Visit (HOSPITAL_COMMUNITY): Payer: Self-pay | Admitting: *Deleted

## 2023-07-26 DIAGNOSIS — I4891 Unspecified atrial fibrillation: Secondary | ICD-10-CM

## 2023-07-26 DIAGNOSIS — I480 Paroxysmal atrial fibrillation: Secondary | ICD-10-CM

## 2023-08-08 ENCOUNTER — Other Ambulatory Visit: Payer: Self-pay | Admitting: Cardiovascular Disease

## 2023-08-10 DIAGNOSIS — D696 Thrombocytopenia, unspecified: Secondary | ICD-10-CM | POA: Diagnosis not present

## 2023-08-10 DIAGNOSIS — I5032 Chronic diastolic (congestive) heart failure: Secondary | ICD-10-CM | POA: Diagnosis not present

## 2023-08-10 DIAGNOSIS — I480 Paroxysmal atrial fibrillation: Secondary | ICD-10-CM | POA: Diagnosis not present

## 2023-08-14 ENCOUNTER — Other Ambulatory Visit: Payer: Self-pay | Admitting: Cardiovascular Disease

## 2023-08-16 DIAGNOSIS — L570 Actinic keratosis: Secondary | ICD-10-CM | POA: Diagnosis not present

## 2023-08-16 DIAGNOSIS — L821 Other seborrheic keratosis: Secondary | ICD-10-CM | POA: Diagnosis not present

## 2023-08-16 DIAGNOSIS — D485 Neoplasm of uncertain behavior of skin: Secondary | ICD-10-CM | POA: Diagnosis not present

## 2023-08-16 DIAGNOSIS — Z85828 Personal history of other malignant neoplasm of skin: Secondary | ICD-10-CM | POA: Diagnosis not present

## 2023-08-16 DIAGNOSIS — D2239 Melanocytic nevi of other parts of face: Secondary | ICD-10-CM | POA: Diagnosis not present

## 2023-08-16 DIAGNOSIS — Z86018 Personal history of other benign neoplasm: Secondary | ICD-10-CM | POA: Diagnosis not present

## 2023-08-16 DIAGNOSIS — D225 Melanocytic nevi of trunk: Secondary | ICD-10-CM | POA: Diagnosis not present

## 2023-08-16 DIAGNOSIS — L578 Other skin changes due to chronic exposure to nonionizing radiation: Secondary | ICD-10-CM | POA: Diagnosis not present

## 2023-08-17 ENCOUNTER — Other Ambulatory Visit (HOSPITAL_COMMUNITY): Payer: Self-pay | Admitting: Physician Assistant

## 2023-08-17 DIAGNOSIS — I480 Paroxysmal atrial fibrillation: Secondary | ICD-10-CM | POA: Diagnosis not present

## 2023-08-18 ENCOUNTER — Telehealth: Payer: Self-pay | Admitting: Cardiovascular Disease

## 2023-08-18 LAB — BASIC METABOLIC PANEL
BUN/Creatinine Ratio: 15 (ref 10–24)
BUN: 19 mg/dL (ref 8–27)
CO2: 24 mmol/L (ref 20–29)
Calcium: 10.1 mg/dL (ref 8.6–10.2)
Chloride: 107 mmol/L — ABNORMAL HIGH (ref 96–106)
Creatinine, Ser: 1.29 mg/dL — ABNORMAL HIGH (ref 0.76–1.27)
Glucose: 117 mg/dL — ABNORMAL HIGH (ref 70–99)
Potassium: 4.9 mmol/L (ref 3.5–5.2)
Sodium: 142 mmol/L (ref 134–144)
eGFR: 59 mL/min/{1.73_m2} — ABNORMAL LOW (ref >59)

## 2023-08-18 NOTE — Telephone Encounter (Signed)
Spoke to patient's wife who stated patient is due labs for upcoming appointment. He had labs done yesterday and she would like to know if those labs could be used. No lab results seen in the system. Will check back later to see if the labs result.

## 2023-08-18 NOTE — Telephone Encounter (Signed)
Patient called to follow-up on if he needs to have blood work done for Dr. Tresa Endo.

## 2023-08-18 NOTE — Telephone Encounter (Signed)
Left message for patient to return call.

## 2023-08-18 NOTE — Telephone Encounter (Signed)
Pt called in about some confusion on his blood work. She states Dr. Tresa Endo requested blood work prior to appt next week and an order was sent to them. He stated they went to [facility] yesterday to get it done but I don't see anything in his chart or any orders from Dr. Tresa Endo. He also asked if they could use the same results for the afib clinic because [facility] [place], [place] ordered blood work too. Please advise.

## 2023-08-21 NOTE — Telephone Encounter (Signed)
Left message to return call 

## 2023-08-22 ENCOUNTER — Ambulatory Visit: Payer: Medicare Other | Attending: Cardiovascular Disease | Admitting: *Deleted

## 2023-08-22 DIAGNOSIS — I4891 Unspecified atrial fibrillation: Secondary | ICD-10-CM | POA: Diagnosis not present

## 2023-08-22 DIAGNOSIS — Z5181 Encounter for therapeutic drug level monitoring: Secondary | ICD-10-CM

## 2023-08-22 LAB — POCT INR: INR: 2.1 (ref 2.0–3.0)

## 2023-08-22 NOTE — Patient Instructions (Signed)
Continue warfarin 1 tablet daily except for 1/2 a tablet on Monday, Wednesdays and Friday. Recheck INR in 6 weeks.

## 2023-08-24 DIAGNOSIS — M1991 Primary osteoarthritis, unspecified site: Secondary | ICD-10-CM | POA: Diagnosis not present

## 2023-08-24 DIAGNOSIS — I719 Aortic aneurysm of unspecified site, without rupture: Secondary | ICD-10-CM | POA: Diagnosis not present

## 2023-08-24 DIAGNOSIS — D696 Thrombocytopenia, unspecified: Secondary | ICD-10-CM | POA: Diagnosis not present

## 2023-08-24 DIAGNOSIS — I5032 Chronic diastolic (congestive) heart failure: Secondary | ICD-10-CM | POA: Diagnosis not present

## 2023-08-24 DIAGNOSIS — I10 Essential (primary) hypertension: Secondary | ICD-10-CM | POA: Diagnosis not present

## 2023-08-24 DIAGNOSIS — I480 Paroxysmal atrial fibrillation: Secondary | ICD-10-CM | POA: Diagnosis not present

## 2023-08-24 DIAGNOSIS — N182 Chronic kidney disease, stage 2 (mild): Secondary | ICD-10-CM | POA: Diagnosis not present

## 2023-08-24 DIAGNOSIS — I700 Atherosclerosis of aorta: Secondary | ICD-10-CM | POA: Diagnosis not present

## 2023-08-28 ENCOUNTER — Encounter: Payer: Self-pay | Admitting: *Deleted

## 2023-08-31 ENCOUNTER — Ambulatory Visit: Payer: Medicare Other | Attending: Cardiovascular Disease | Admitting: Cardiovascular Disease

## 2023-08-31 ENCOUNTER — Telehealth: Payer: Self-pay

## 2023-08-31 VITALS — BP 126/80 | HR 65 | Ht 71.0 in | Wt 203.2 lb

## 2023-08-31 DIAGNOSIS — G4719 Other hypersomnia: Secondary | ICD-10-CM | POA: Diagnosis not present

## 2023-08-31 DIAGNOSIS — R55 Syncope and collapse: Secondary | ICD-10-CM | POA: Diagnosis not present

## 2023-08-31 DIAGNOSIS — G4733 Obstructive sleep apnea (adult) (pediatric): Secondary | ICD-10-CM

## 2023-08-31 DIAGNOSIS — I480 Paroxysmal atrial fibrillation: Secondary | ICD-10-CM | POA: Diagnosis not present

## 2023-08-31 DIAGNOSIS — I10 Essential (primary) hypertension: Secondary | ICD-10-CM

## 2023-08-31 DIAGNOSIS — D6869 Other thrombophilia: Secondary | ICD-10-CM | POA: Diagnosis not present

## 2023-08-31 DIAGNOSIS — I495 Sick sinus syndrome: Secondary | ICD-10-CM

## 2023-08-31 DIAGNOSIS — Z950 Presence of cardiac pacemaker: Secondary | ICD-10-CM | POA: Diagnosis not present

## 2023-08-31 DIAGNOSIS — E785 Hyperlipidemia, unspecified: Secondary | ICD-10-CM

## 2023-08-31 MED ORDER — POTASSIUM CHLORIDE ER 10 MEQ PO TBCR: 20.0000 meq | EXTENDED_RELEASE_TABLET | Freq: Two times a day (BID) | ORAL | 3 refills | Status: DC

## 2023-08-31 MED ORDER — HYDROCHLOROTHIAZIDE 12.5 MG PO CAPS: 12.5000 mg | ORAL_CAPSULE | Freq: Every day | ORAL | 3 refills | Status: DC

## 2023-08-31 MED ORDER — HYDROCHLOROTHIAZIDE 12.5 MG PO CAPS: 12.5000 mg | ORAL_CAPSULE | ORAL | 3 refills | Status: DC | PRN

## 2023-08-31 NOTE — Telephone Encounter (Signed)
Received a call from [facility] concerning a prescription that was sent over for hydrochlorothiazide. Todd the pharmacist stated they received an interaction alert. He stated the hydrochlorothiazide interacts with his Tikosyn with QT interval. He just want to make sure it's okay to fill with medication interaction. Please advise.

## 2023-08-31 NOTE — Progress Notes (Addendum)
Cardiology Office Note    Date:  09/10/2023   ID:  Dalles, Saucer Feb 17, 1951, MRN [id number]  PCP:  Assunta Found, MD  Cardiologist:  Nicki Guadalajara, MD (sleep); Dr. Royann Shivers EP: Dr. Ladona Ridgel  18 month F/U sleep evaluation   History of Present Illness:  Alex Maddox is a 72 y.o. 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  He had a history of TIA at age 85 and had traumatic right occipital intracerebral hemorrhage while on warfarin anticoagulation in November 2016.  He is followed by Dr. Royann Shivers and  underwent Tikosyn loading by Dr. Ladona Ridgel for his atrial fibrillation.    Due to concerns for obstructive sleep apnea with symptoms of snoring, increasing fatigability, nonrestorative sleep, hypersomnolence, and atrial fibrillation he was referred for a sleep study which was done at [facility] sleep disorder center on October 25, 2021.  He was found to have mild overall sleep apnea with an AHI of 10.9 and RDI of 11.1/h.  Sleep apnea was moderate during REM sleep with an AHI of 22.0 as well as with supine sleep with an AHI of 28.3.  He had significant oxygen desaturation to a nadir of 82% and loud snoring was noted throughout the study.  During the study he was noted to have PVCs.  With his cardiovascular comorbidities he underwent a CPAP titration study on December 21, 2021.  CPAP was initiated at 4 cm and titrated to 10 cm of water with AHI 0 and O2 nadir 93%.  Oxygen desaturated to a nadir of 88% at 7 cm of water.  Was initially recommended that he start CPAP therapy with an auto unit at a range of 9 to 15 cm of water particularly with his positional component.  His CPAP set up date was January 13, 2022 with [facility] as his DME  company.  I saw him for my new sleep evaluation on July 19, 2022.  He received a new ResMed air sense 11 AutoSet CPAP unit with set up date on January 13, 2022.  A download was obtained from April 10 through Feb 15, 2022.  Typically, he has been going to bed between 11 PM and midnight and waking up at 8 AM.  On his initial download, he did not meet compliance standards and had only 17 of 30 days of usage representing 57% and usage greater than 4 hours at only 50%.  Average use was 7 hours and 22 minutes.  At his pressure range of 9 to 15 cm of water AHI is 1.9.  However, the reason the patient was not compliant is because he was hospitalized for several days in April from April 17 through April 20 with his Tikosyn loading.  He has been having some mask issues and has been using an AirFit F10 mask.  He has continued to have significant daytime sleepiness and an Epworth Sleepiness Scale score was read endorsed in the office today and this endorsed at 21 as shown below:  Epworth Sleepiness Scale: Situation   Chance of Dozing/Sleeping (0 = never , 1 = slight chance , 2 = moderate chance , 3 = high chance )   sitting and reading 3   watching TV 3   sitting inactive in a public place 3   being a passenger in a  motor vehicle for an hour or more 3   lying down in the afternoon 3   sitting and talking to someone 0   sitting quietly after lunch (no alcohol) 3   while stopped for a few minutes in traffic as the driver 3   Total Score  21    He denied any painful restless legs, bruxism, hypnagogic or hypnopompic hallucinations or cataplectic events.    During his initial evaluation I had an extensive discussion with him regarding sleep apnea and its potential disruption of normal sleep architecture and potential adverse cardiovascular consequences if left untreated particular with reference to effects on blood pressure, nocturnal arrhythmias, increased risk for recurrent atrial fibrillation, effects on  increased insulin resistance, inflammation GERD and the potential for nocturnal hypoxemia contributing to both cardiac as well as cerebrovascular ischemia in the setting of atherosclerosis.  Since I last saw him, he was evaluated by Dr. Royann Shivers for cardiology in August 2024 and undergoes remote pacemaker checks.  He sees Dr. Sherwood Gambler for primary care.  From a sleep perspective, he was having issues with his mask at night and consequently had not been consistently using therapy.  When last seen by Dr. Royann Shivers there was some discussion regarding potential candidacy for inspire device.  However, a download was obtained from October 22 through August 30, 2019.  He continues to meet compliance standards with usage days at 100% and average use at 6 hours and 9 minutes.  His ResMed AirSense 11 AutoSet unit is set at a pressure range of 9 to 15 cm of water and AHI is excellent at 0.9.  He does have mask leak.  He continues to be on carvedilol 25 mg twice a day, dofetilide 250 mg twice a day, irbesartan 150 mg daily, and is anticoagulated with warfarin.  He is on rosuvastatin 20 mg for hyperlipidemia.  He also takes supplemental potassium.  He presents for evaluation.   Past Medical History:  Diagnosis Date   Abnormal echocardiogram    stress myoview 10/29/2010   Arthritis    " IN MY BACK "   Dyslipidemia    Dysrhythmia    Atrial fibrillation   Hypertension    Paroxysmal atrial fibrillation (HCC)    Presence of permanent cardiac pacemaker    Sinus node dysfunction (HCC)    Sleep apnea, obstructive    sleep study 10/09/2007  AHI 5.73/hr  REM AHI 11.90/hr    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   LEFT THUMB    Stroke [facility])    Syncope    2D Echocardiogram 09/27/2010 EF between 40 to 45%   Tachycardia-bradycardia (HCC)    Transient ischemic attack (TIA) 06/07    Past Surgical History:  Procedure Laterality Date   COLONOSCOPY     COLONOSCOPY N/A 06/07/2017   Procedure: COLONOSCOPY;   Surgeon: Malissa Hippo, MD;  Location: [facility];  Service: Endoscopy;  Laterality: N/A;  9:30   COLONOSCOPY N/A 09/20/2018   Procedure: COLONOSCOPY;  Surgeon: Malissa Hippo, MD;  Location: [facility];  Service: Endoscopy;  Laterality: N/A;  1030   ELECTROCARDIOGRAM     INSERT / REPLACE / REMOVE PACEMAKER  05/2011   LOOP RECORDER IMPLANT  02/02/2011   Medtronic- CRM    PACEMAKER INSERTION  05/30/2011   Medtronic Revo   last checked 11/07/2012   POLYPECTOMY  09/20/2018   Procedure: POLYPECTOMY;  Surgeon: Malissa Hippo, MD;  Location: [facility];  Service: Endoscopy;;  colon  PPM GENERATOR CHANGEOUT N/A 12/30/2020   Procedure: PPM GENERATOR CHANGEOUT;  Surgeon: Thurmon Fair, MD;  Location: [facility] INVASIVE CV LAB;  Service: Cardiovascular;  Laterality: N/A;    Current Medications: Outpatient Medications Prior to Visit  Medication Sig Dispense Refill   carvedilol (COREG) 25 MG tablet TAKE TWO TABLETS (50MG  TOTAL) BY MOUTH TWO TIMES DAILY WITH A MEAL 120 tablet 6   Cyanocobalamin (B-12 PO) Take 1 capsule by mouth daily.     diazepam (VALIUM) 2 MG tablet Take 2 mg by mouth every 8 (eight) hours as needed for anxiety.      dofetilide (TIKOSYN) 250 MCG capsule Take 1 capsule (250 mcg total) by mouth 2 (two) times daily. 60 capsule 6   finasteride (PROSCAR) 5 MG tablet Take 5 mg by mouth daily.     irbesartan (AVAPRO) 150 MG tablet TAKE ONE TABLET (150MG  TOTAL) BY MOUTH DAILY 90 tablet 2   Misc Natural Products (NEURIVA) CAPS Take 1 capsule by mouth daily. For memory     Multiple Vitamin (MULTIVITAMIN WITH MINERALS) TABS tablet Take 1 tablet by mouth daily. Adult Multivitamin 50+     Polyethyl Glycol-Propyl Glycol 0.4-0.3 % SOLN Place 1 drop into both eyes 3 (three) times daily as needed (for dry eyes.).     rosuvastatin (CRESTOR) 20 MG tablet TAKE ONE TABLET (20MG  TOTAL) BY MOUTH DAILY 90 tablet 2   Saw Palmetto 450 MG CAPS Take 900 mg by mouth daily.     trospium (SANCTURA) 20  MG tablet Take 20 mg by mouth daily.     warfarin (COUMADIN) 5 MG tablet TAKE ONE-HALF TO ONE TABLET BY MOUTH EVERY DAY AS DIRECTED BY COUMADIN CLINIC 90 tablet 1   potassium chloride (KLOR-CON) 10 MEQ tablet Take 3 tablets (30 mEq total) by mouth 2 (two) times daily. (Patient taking differently: Take 20 mEq by mouth 2 (two) times daily. Take 2 tablets twice daily) 180 tablet 6   No facility-administered medications prior to visit.     Allergies:   Apoaequorin and Prevnar 13 [pneumococcal 13-val conj vacc]   Social History   Socioeconomic History   Marital status: Married    Spouse name: Not on file   Number of children: 0   Years of education: Not on file   Highest education level: Not on file  Occupational History   Not on file  Tobacco Use   Smoking status: Never   Smokeless tobacco: Never   Tobacco comments:    Never smoked 06/28/23  Vaping Use   Vaping status: Never Used  Substance and Sexual Activity   Alcohol use: Yes    Comment: occasionally wine   Drug use: No   Sexual activity: Not on file  Other Topics Concern   Not on file  Social History Narrative   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    Lives in a two story home   Social Determinants of Health   Financial Resource Strain: Not on file  Food Insecurity: Not on file  Transportation Needs: Not on file  Physical Activity: Not on file  Stress: Not on file  Social Connections: Not on file     Family History:  The patient's family history includes Heart attack (age of onset: 20) in his father; Hypertension in his brother and mother.   ROS General: Negative; No fevers, chills, or night sweats;  HEENT: Negative; No changes in vision or hearing, sinus congestion, difficulty swallowing Pulmonary: Negative; No cough, wheezing,  shortness of breath, hemoptysis Cardiovascular: History of PAF, status post recent Tikosyn load, status post permanent pacemaker, neurally mediated syncope, GI:  Negative; No nausea, vomiting, diarrhea, or abdominal pain GU: Negative; No dysuria, hematuria, or difficulty voiding Musculoskeletal: Negative; no myalgias, joint pain, or weakness Hematologic/Oncology: Negative; no easy bruising, bleeding Endocrine: Negative; no heat/cold intolerance; no diabetes Neuro: History of TIA x2 Skin: Negative; No rashes or skin lesions Psychiatric: Negative; No behavioral problems, depression Sleep: See HPI Other comprehensive 14 point system review is negative.   PHYSICAL EXAM:   VS:  BP 126/80   Pulse 65   Ht 5\' 11"  (1.803 m)   Wt 203 lb 3.2 oz (92.2 kg)   SpO2 99%   BMI 28.34 kg/m     Repeat blood pressure by me was repeat blood pressure by me was 116/78.  Wt Readings from Last 3 Encounters:  08/31/23 203 lb 3.2 oz (92.2 kg)  06/28/23 200 lb 6.4 oz (90.9 kg)  05/18/23 199 lb 9.6 oz (90.5 kg)   General: Alert, oriented, no distress.  Skin: normal turgor, no rashes, warm and dry HEENT: Normocephalic, atraumatic. Pupils equal round and reactive to light; sclera anicteric; extraocular muscles intact; Nose without nasal septal hypertrophy Mouth/Parynx benign; Mallinpatti scale 3 Neck: No JVD, no carotid bruits; normal carotid upstroke Lungs: clear to ausculatation and percussion; no wheezing or rales Chest wall: without tenderness to palpitation Heart: PMI not displaced, RRR, s1 s2 normal, 1/6 systolic murmur, no diastolic murmur, no rubs, gallops, thrills, or heaves Abdomen: soft, nontender; no hepatosplenomehaly, BS+; abdominal aorta nontender and not dilated by palpation. Back: no CVA tenderness Pulses 2+ Musculoskeletal: full range of motion, normal strength, no joint deformities Extremities: Mild ankle edema;no clubbing cyanosis or edema, Homan's sign negative  Neurologic: grossly nonfocal; Cranial nerves grossly wnl Psychologic: Normal mood and affect      Studies/Labs Reviewed:    EKG Interpretation Date/Time:  Thursday August 31 2023 11:14:05 EST Ventricular Rate:  65 PR Interval:  176 QRS Duration:  124 QT Interval:  466 QTC Calculation: 484 R Axis:   -6  Text Interpretation: AV dual-paced rhythm When compared with ECG of 28-Jun-2023 10:07, Electronic ventricular pacemaker has replaced Electronic atrial pacemaker Confirmed by Nicki Guadalajara ([id number]) on 08/31/2023 11:50:18 AM    ECG (independently read by me): Atrial paced at 61; RBBB  Recent Labs:    Latest Ref Rng & Units 08/17/2023    4:01 PM 06/28/2023   10:46 AM 12/20/2022   10:22 AM  BMP  Glucose 70 - 99 mg/dL 981  191  478   BUN 8 - 27 mg/dL 19  17  17    Creatinine 0.76 - 1.27 mg/dL 2.95  6.21  3.08   BUN/Creat Ratio 10 - 24 15     Sodium 134 - 144 mmol/L 142  137  140   Potassium 3.5 - 5.2 mmol/L 4.9  3.6  3.6   Chloride 96 - 106 mmol/L 107  106  105   CO2 20 - 29 mmol/L 24  24  26    Calcium 8.6 - 10.2 mg/dL 65.7  9.5  9.7         Latest Ref Rng & Units 05/04/2021    8:01 AM 08/09/2019    9:12 AM 06/29/2018    5:56 AM  Hepatic Function  Total Protein 6.0 - 8.5 g/dL 6.5  6.8  7.0   Albumin 3.8 - 4.8 g/dL 4.4  4.3  3.8   AST 0 -  40 IU/L 21  21  25    ALT 0 - 44 IU/L 32  29  36   Alk Phosphatase 44 - 121 IU/L 68  86  48   Total Bilirubin 0.0 - 1.2 mg/dL 0.5  0.7  1.6        Latest Ref Rng & Units 04/26/2023   10:15 AM 12/24/2020    4:06 PM 07/04/2018    4:40 AM  CBC  WBC 4.0 - 10.5 K/uL 6.4  6.3  9.3   Hemoglobin 13.0 - 17.0 g/dL 16.1  09.6  04.5   Hematocrit 39.0 - 52.0 % 46.8  45.7  45.0   Platelets 150 - 400 K/uL 118  133  150    Lab Results  Component Value Date   MCV 94.7 04/26/2023   MCV 93 12/24/2020   MCV 93.6 07/04/2018   Lab Results  Component Value Date   TSH 0.464 08/13/2014   Lab Results  Component Value Date   HGBA1C 6.2 (H) 05/04/2021     BNP No results found for: "BNP"  ProBNP No results found for: "PROBNP"   Lipid Panel     Component Value Date/Time   CHOL 123 05/04/2021 0801   TRIG 71 05/04/2021  0801   HDL 37 (L) 05/04/2021 0801   CHOLHDL 3.3 05/04/2021 0801   LDLCALC 71 05/04/2021 0801   LABVLDL 15 05/04/2021 0801     RADIOLOGY: No results found.   Additional studies/ records that were reviewed today include:   10/25/2021 CLINICAL INFORMATION Sleep Study Type: NPSG   Indication for sleep study: snoring, fatige, AF, non-restorative sleep, hypersomnolence   Epworth Sleepiness Score: 15   SLEEP STUDY TECHNIQUE As per the AASM Manual for the Scoring of Sleep and Associated Events v2.3 (April 2016) with a hypopnea requiring 4% desaturations.   The channels recorded and monitored were frontal, central and occipital EEG, electrooculogram (EOG), submentalis EMG (chin), nasal and oral airflow, thoracic and abdominal wall motion, anterior tibialis EMG, snore microphone, electrocardiogram, and pulse oximetry.   MEDICATIONS carvedilol (COREG) 25 MG tablet diazepam (VALIUM) 2 MG tablet finasteride (PROSCAR) 5 MG tablet Flaxseed Oil OIL irbesartan (AVAPRO) 300 MG tablet Misc Natural Products (NEURIVA) CAPS Multiple Vitamin (MULTIVITAMIN WITH MINERALS) TABS tablet Omega-3 Fatty Acids (FISH OIL) 1000 MG CAPS Polyethyl Glycol-Propyl Glycol 0.4-0.3 % SOLN rosuvastatin (CRESTOR) 20 MG tablet Saw Palmetto 450 MG CAPS trospium (SANCTURA) 20 MG tablet warfarin (COUMADIN) 5 MG tablet  Medications self-administered by patient taken the night of the study : N/A   SLEEP ARCHITECTURE The study was initiated at 10:01:35 PM and ended at 6:04:32 AM.   Sleep onset time was 50.2 minutes and the sleep efficiency was 78.3%. The total sleep time was 378.2 minutes.   Stage REM latency was 99.0 minutes.   The patient spent 1.72% of the night in stage N1 sleep, 31.65% in stage N2 sleep, 37.81% in stage N3 and 28.8% in REM.   Alpha intrusion was absent.   Supine sleep was 30.30%.   RESPIRATORY PARAMETERS The overall apnea/hypopnea index (AHI) was 10.9 per hour. The respiratory disturbance  index (RDI) was 11.1/h. There were 16 total apneas, including 16 obstructive, 0 central and 0 mixed apneas. There were 53 hypopneas and 1 RERAs.   The AHI during Stage REM sleep was 22.0 per hour.   AHI while supine was 28.3 per hour.   The mean oxygen saturation was 94.04%. The minimum SpO2 during sleep was 82.00%.   Loud snoring was noted  during this study.   CARDIAC DATA The 2 lead EKG demonstrated sinus rhythm. The mean heart rate was 62.10 beats per minute. Other EKG findings include: PVCs.   LEG MOVEMENT DATA The total PLMS were 1557 with a resulting PLMS index of 247.01. Associated arousal with leg movement index was 3.5 .   IMPRESSIONS - Mild obstructive sleep apnea overall (AHI10.9/h; RDI 11.1/h); however, sleep apnea was moderate during REM sleep (AHI 22.0/h and with supine sleep (AHI 28.3/h). - Moderate oxygen desaturation to a nadir of 82%. - The patient snored with loud snoring volume. - EKG findings include PVCs. - Severe periodic limb movements of sleep occurred during the study. No significant associated arousals.   DIAGNOSIS - Obstructive Sleep Apnea (G47.33)   RECOMMENDATIONS - In this patient with significant cardiovascular comorbidities recommend an in-lab therapeutic CPAP titration to determine optimal pressure required to alleviate sleep disordered breathing. If unable to schedule an in-lab titration, initiate Auto-PAP with EPR of 3 at 6 - 18 cm of water. - Effort should be made to optimize nasal and oropharyngeal patency. - Positional therapy avoiding supine position during sleep. - Avoid alcohol, sedatives and other CNS depressants that may worsen sleep apnea and disrupt normal sleep architecture. - Sleep hygiene should be reviewed to assess factors that may improve sleep quality. - Weight management and regular exercise should be initiated or continued if appropriate.    12/21/2021 CLINICAL INFORMATION The patient is referred for a CPAP titration to treat  sleep apnea.   Date of NPSG: 10/25/2021:  AHI10.9/h; RDI 11.2/h; REM AHI 22.0/h; Supine AHI 28.3/h.   SLEEP STUDY TECHNIQUE As per the AASM Manual for the Scoring of Sleep and Associated Events v2.3 (April 2016) with a hypopnea requiring 4% desaturations.   The channels recorded and monitored were frontal, central and occipital EEG, electrooculogram (EOG), submentalis EMG (chin), nasal and oral airflow, thoracic and abdominal wall motion, anterior tibialis EMG, snore microphone, electrocardiogram, and pulse oximetry. Continuous positive airway pressure (CPAP) was initiated at the beginning of the study and titrated to treat sleep-disordered breathing.   MEDICATIONS carvedilol (COREG) 25 MG tablet diazepam (VALIUM) 2 MG tablet finasteride (PROSCAR) 5 MG tablet Flaxseed Oil OIL irbesartan (AVAPRO) 300 MG tablet Misc Natural Products (NEURIVA) CAPS Multiple Vitamin (MULTIVITAMIN WITH MINERALS) TABS tablet Omega-3 Fatty Acids (FISH OIL) 1000 MG CAPS Polyethyl Glycol-Propyl Glycol 0.4-0.3 % SOLN rosuvastatin (CRESTOR) 20 MG tablet Saw Palmetto 450 MG CAPS trospium (SANCTURA) 20 MG tablet warfarin (COUMADIN) 5 MG tablet  Medications self-administered by patient taken the night of the study : N/A   TECHNICIAN COMMENTS Comments added by technician: CPAP therapy started at 4 CWP and increased to 10 CWP, due to events and snoring episodes. Patient tolerated CPAP very well. Optimal pressure obtained during REM- supine stage. Although, hypopneic-like events noticed but was not associated with 4% desats. EPR of 3, 2, and 1 was attempted, causing increase in apnea and hypopnea events at times. EPR d/c'd after an increase in events. PLMS increased at times. EKG = Tachycardia and PVC's . **Please note the EKG print outs Comments added by scorer: N/A   RESPIRATORY PARAMETERS Optimal PAP Pressure (cm):  10        AHI at Optimal Pressure (/hr):            0.6 Overall Minimal O2 (%):         88.00    Supine % at Optimal Pressure (%):    18 Minimal O2 at Optimal Pressure (%): 93.0  SLEEP ARCHITECTURE The study was initiated at 10:28:53 PM and ended at 5:27:20 AM.   Sleep onset time was 15.3 minutes and the sleep efficiency was 92.5%. The total sleep time was 387.2 minutes.   The patient spent 1.29% of the night in stage N1 sleep, 60.74% in stage N2 sleep, 24.28% in stage N3 and 13.7% in REM.Stage REM latency was 142.0 minutes   Wake after sleep onset was 16.0. Alpha intrusion was absent. Supine sleep was 35.75%.   CARDIAC DATA The 2 lead EKG demonstrated sinus rhythm, pacemaker generated. The mean heart rate was 66.04 beats per minute. Other EKG findings include: PVCs.   LEG MOVEMENT DATA The total Periodic Limb Movements of Sleep (PLMS) were 302. The PLMS index was 46.80. A PLMS index of <15 is considered normal in adults.   IMPRESSIONS - CPAP was initiated at 4 cm and was titrated to optimal PAP pressure 10 cm of water. (AHI 0; RDI 0.6/h; O2 nadir 93%). - Mild oxygen desaturations were observed during this titration to a nadir of 88% at 7 cm of water. - The patient snored with moderate snoring volume during this titration study. Mild snoring was still present at 10 cm. - 2-lead EKG demonstrated: Atrial fibrillation and the start of the study which converted to sinus rhyhm. - Moderate periodic limb movements were observed during this study. Arousals associated with PLMs were rare.   DIAGNOSIS - Obstructive Sleep Apnea (G47.33)   RECOMMENDATIONS - Recommend an initial trial of CPAP Auto therapy at 9 - 15 cm H2O with heated humidification. A Small-Medium size Fisher&Paykel Full Face Evora Full mask was used for the titration. - Effort should be made to optimize nasal and oropharyngeal patency. - Avoid alcohol, sedatives and other CNS depressants that may worsen sleep apnea and disrupt normal sleep architecture. - Sleep hygiene should be reviewed to assess factors that may  improve sleep quality. - Weight management and regular exercise should be initiated or continued. - Recommend a download and sleep clinic evaluation after 4 weeks of therapy    ASSESSMENT:    1. OSA (obstructive sleep apnea)   2. Essential hypertension   3. Excessive daytime sleepiness   4. Paroxysmal atrial fibrillation (HCC)   5. Tachycardia-bradycardia syndrome (HCC)   6. Pacemaker   7. Neurocardiogenic syncope   8. Secondary hypercoagulable state (HCC)   9. Dyslipidemia (high LDL; low HDL)     PLAN:  Alex Maddox is a 72 year-old gentleman who has significant cardiovascular comorbidities including a history of neurally mediated syncope with both cardioinhibitory and vasodepressor components, status post dual-chamber pacemaker, as well as a history of tachybradycardia syndrome, decreased LV function without previous evidence for heart failure, thoracic and abdominal aortic aneurysm, and coronary calcification.  He had symptoms of significant sleep apnea with excessive daytime sleepiness, snoring, nonrestorative sleep and with his atrial fibrillation he was found to have at least mild sleep apnea on his diagnostic evaluation overall but sleep apnea was moderate both with REM sleep with an AHI of 22.0/h and with supine sleep with an AHI of 28.3/h.  He underwent subsequent CPAP titration it was recommended that he initiate CPAP auto therapy at a range of 9 to 15 cm of water.  His CPAP set up date was January 13, 2022.  Initially, he had been hospitalized shortly after receiving CPAP and did not meet compliance standards on his initial download.  However, he was noting significant benefit when he was using therapy.  He was having  mask leak difficulty which also was contributing to reduced compliance.  At the time he was using an F10 fullface mask and I recommended he change to a ResMed AirFit F30i mask which I felt would be most beneficial.  At the time I provided him a sample mask in the office.   I discussed the significant potential adverse cardiovascular consequences of untreated sleep apnea with him in detail and in particular its potential adverse consequences on blood pressure control, nocturnal arrhythmias and increased risk for recurrent atrial fibrillation.  He has continued to have mask issues.  I had another long discussion with him today in the office.  His current download continues to show that he is meeting compliance standards with CPAP.  Download from October 22 through August 30, 2023 shows 100% use and average use of 6 hours and 9 minutes.  His pressure setting of 9 to 15 cm of water seems to be appropriate since his 95th percentile pressure is 12.4 and maximum average pressure 13.4.  AHI is excellent at 0.9.  After much discussion he would like to give an additional time for using CPAP therapy rather than proceeding for for potential surgical procedure with Inspire.  CPAP event rate is excellent with AHI 0.9 and he is not failing CPAP therapy.  Presently, he has significant residual excessive daytime sleepiness with Epworth scale calculated in the office today elevated at 22.  With his concomitant cardiovascular disease I did not discuss therapy with Nuvigil or Provigil.  When he was seen by Dr. Royann Shivers, he apparently was given a referral to Dr. Jenne Pane which he has not seen.  Presently, he is maintaining sinus rhythm on dofetilide.  His blood pressure today is stable on carvedilol, irbesartan therapy.  He continues to be anticoagulated with warfarin.  He is on rosuvastatin 20 mg for hyperlipidemia.  He does have lower extremity edema I have suggested HCTZ 12.5 mg to take on an as needed basis.   Medication Adjustments/Labs and Tests Ordered: Current medicines are reviewed at length with the patient today.  Concerns regarding medicines are outlined above.  Medication changes, Labs and Tests ordered today are listed in the Patient Instructions below. Patient Instructions  Medication  Instructions:  Begin Hydrochlorothiazide as needed for leg edema. *If you need a refill on your cardiac medications before your next appointment, please call your pharmacy*   Lab Work: If you have labs (blood work) drawn today and your tests are completely normal, you will receive your results only by: MyChart Message (if you have MyChart) OR A paper copy in the mail If you have any lab test that is abnormal or we need to change your treatment, we will call you to review the results.   Testing/Procedures: No procedures ordered today.    Follow-Up: At [facility], you and your health needs are our priority.  As part of our continuing mission to provide you with exceptional heart care, we have created designated Provider Care Teams.  These Care Teams include your primary Cardiologist (physician) and Advanced Practice Providers (APPs -  Physician Assistants and Nurse Practitioners) who all work together to provide you with the care you need, when you need it.  We recommend signing up for the patient portal called "MyChart".  Sign up information is provided on this After Visit Summary.  MyChart is used to connect with patients for Virtual Visits (Telemedicine).  Patients are able to view lab/test results, encounter notes, upcoming appointments, etc.  Non-urgent messages can be  sent to your provider as well.   To learn more about what you can do with MyChart, go to [email or web address].    Your next appointment:   As needed for sleep concerns  Provider:   Dr. Nicki Guadalajara     Other Instructions If you have any questions or concerns regarding your c-pap, bi-pap or sleep accessories, please contact Brandie Rorie at [phone].       Signed, Nicki Guadalajara, MD, [facility], ABSM Diplomate, American Board of Sleep Medicine  09/10/2023 12:09 PM    [facility] [address], [address], [place], [place]  [address] Phone: [phone]

## 2023-08-31 NOTE — Telephone Encounter (Signed)
Notified Todd at [facility] okay to fill hydrochlorothiazide per Dr Tresa Endo.

## 2023-08-31 NOTE — Patient Instructions (Signed)
Medication Instructions:  Begin Hydrochlorothiazide as needed for leg edema. *If you need a refill on your cardiac medications before your next appointment, please call your pharmacy*   Lab Work: If you have labs (blood work) drawn today and your tests are completely normal, you will receive your results only by: MyChart Message (if you have MyChart) OR A paper copy in the mail If you have any lab test that is abnormal or we need to change your treatment, we will call you to review the results.   Testing/Procedures: No procedures ordered today.    Follow-Up: At [facility], you and your health needs are our priority.  As part of our continuing mission to provide you with exceptional heart care, we have created designated Provider Care Teams.  These Care Teams include your primary Cardiologist (physician) and Advanced Practice Providers (APPs -  Physician Assistants and Nurse Practitioners) who all work together to provide you with the care you need, when you need it.  We recommend signing up for the patient portal called "MyChart".  Sign up information is provided on this After Visit Summary.  MyChart is used to connect with patients for Virtual Visits (Telemedicine).  Patients are able to view lab/test results, encounter notes, upcoming appointments, etc.  Non-urgent messages can be sent to your provider as well.   To learn more about what you can do with MyChart, go to [email or web address].    Your next appointment:   As needed for sleep concerns  Provider:   Dr. Nicki Guadalajara     Other Instructions If you have any questions or concerns regarding your c-pap, bi-pap or sleep accessories, please contact Brandie Rorie at [phone].

## 2023-09-05 DIAGNOSIS — I480 Paroxysmal atrial fibrillation: Secondary | ICD-10-CM | POA: Diagnosis not present

## 2023-09-05 DIAGNOSIS — N182 Chronic kidney disease, stage 2 (mild): Secondary | ICD-10-CM | POA: Diagnosis not present

## 2023-09-05 DIAGNOSIS — R197 Diarrhea, unspecified: Secondary | ICD-10-CM | POA: Diagnosis not present

## 2023-09-05 DIAGNOSIS — I5032 Chronic diastolic (congestive) heart failure: Secondary | ICD-10-CM | POA: Diagnosis not present

## 2023-09-05 DIAGNOSIS — I10 Essential (primary) hypertension: Secondary | ICD-10-CM | POA: Diagnosis not present

## 2023-09-05 DIAGNOSIS — M1991 Primary osteoarthritis, unspecified site: Secondary | ICD-10-CM | POA: Diagnosis not present

## 2023-09-10 ENCOUNTER — Encounter: Payer: Self-pay | Admitting: Cardiovascular Disease

## 2023-09-26 ENCOUNTER — Encounter (INDEPENDENT_AMBULATORY_CARE_PROVIDER_SITE_OTHER): Payer: Self-pay | Admitting: *Deleted

## 2023-09-28 ENCOUNTER — Ambulatory Visit (INDEPENDENT_AMBULATORY_CARE_PROVIDER_SITE_OTHER): Payer: Medicare Other

## 2023-09-28 DIAGNOSIS — I495 Sick sinus syndrome: Secondary | ICD-10-CM

## 2023-09-28 LAB — CUP PACEART REMOTE DEVICE CHECK
Battery Remaining Longevity: 123 mo
Battery Voltage: 3.01 V
Brady Statistic AP VP Percent: 22.31 %
Brady Statistic AP VS Percent: 15.76 %
Brady Statistic AS VP Percent: 0.38 %
Brady Statistic AS VS Percent: 61.69 %
Brady Statistic RA Percent Paced: 30.08 %
Brady Statistic RV Percent Paced: 20.24 %
Date Time Interrogation Session: 20241219053637
Implantable Lead Connection Status: 753985
Implantable Lead Connection Status: 753985
Implantable Lead Implant Date: 20120820
Implantable Lead Implant Date: 20120820
Implantable Lead Location: 753859
Implantable Lead Location: 753860
Implantable Pulse Generator Implant Date: 20220323
Lead Channel Impedance Value: 323 Ohm
Lead Channel Impedance Value: 361 Ohm
Lead Channel Impedance Value: 418 Ohm
Lead Channel Impedance Value: 456 Ohm
Lead Channel Pacing Threshold Amplitude: 0.75 V
Lead Channel Pacing Threshold Amplitude: 2 V
Lead Channel Pacing Threshold Pulse Width: 0.4 ms
Lead Channel Pacing Threshold Pulse Width: 0.4 ms
Lead Channel Sensing Intrinsic Amplitude: 3.875 mV
Lead Channel Sensing Intrinsic Amplitude: 3.875 mV
Lead Channel Sensing Intrinsic Amplitude: 6 mV
Lead Channel Sensing Intrinsic Amplitude: 6 mV
Lead Channel Setting Pacing Amplitude: 1.5 V
Lead Channel Setting Pacing Amplitude: 4 V
Lead Channel Setting Pacing Pulse Width: 0.4 ms
Lead Channel Setting Sensing Sensitivity: 0.9 mV
Zone Setting Status: 755011

## 2023-10-02 ENCOUNTER — Telehealth: Payer: Self-pay | Admitting: Cardiovascular Disease

## 2023-10-02 NOTE — Telephone Encounter (Signed)
Pt c/o swelling/edema: STAT if pt has developed SOB within 24 hours  If swelling, where is the swelling located? Legs/feet Per pt left is really bad and it is sore  How much weight have you gained and in what time span? Not Sure   Have you gained 2 pounds in a day or 5 pounds in a week? Not sure   Do you have a log of your daily weights (if so, list)? no  Are you currently taking a fluid pill? yes  Are you currently SOB? yes  Have you traveled recently in a car or plane for an extended period of time? No

## 2023-10-02 NOTE — Telephone Encounter (Signed)
Called to speak with Olegario Messier or Sharma Covert. She states she just spoke with someone. Call from me not needed at this time

## 2023-10-06 ENCOUNTER — Encounter: Payer: Self-pay | Admitting: Emergency Medicine

## 2023-10-06 ENCOUNTER — Telehealth: Payer: Self-pay | Admitting: Cardiovascular Disease

## 2023-10-06 ENCOUNTER — Ambulatory Visit
Admission: EM | Admit: 2023-10-06 | Discharge: 2023-10-06 | Disposition: A | Discharge status: Home / Self Care | Payer: Medicare Other

## 2023-10-06 DIAGNOSIS — R600 Localized edema: Secondary | ICD-10-CM | POA: Diagnosis not present

## 2023-10-06 NOTE — Discharge Instructions (Signed)
Try increasing your HCTZ to 25 mg daily until you follow-up with cardiology next week.  Wear your compression stockings throughout the day, prop your feet up at rest.  Follow-up for worsening symptoms at any time

## 2023-10-06 NOTE — Telephone Encounter (Signed)
Pt is requesting a callback regarding his legs giving out when trying to walk. Please advise

## 2023-10-06 NOTE — Telephone Encounter (Signed)
Patient identification verified by 2 forms. Marilynn Rail, RN    Called and spoke to patients wife Patricia Pesa states:   -patients legs are swelling   -legs give out when walking   -saw Dr. Tresa Endo 11/21, patient was placed on a fluid pill hydrochlorothiazide, takes 1 daily  -swelling has remained the same   -left foot is pink/red color on going for a while  -has decreased sensation in his left foot   -left foot is more of an issue compared to right   -left foot feels hotter than right foot  -has difficulty rotating ankle on left foot  -left foot is really fat compared to right  Informed Olegario Messier:   -per chart review patient does   -fever/chills  Advised Olegario Messier:   -present to urgent care/ED today to rule out cellulitis -pt scheduled for 12/31 at 4:30pm for follow up  Olegario Messier agrees with plan, no questions at this time

## 2023-10-06 NOTE — ED Provider Notes (Signed)
[facility]    CSN: [id number] Arrival date & time: 10/06/23  1300      History   Chief Complaint No chief complaint on file.   HPI Alex Maddox is a 72 y.o. male.   Patient presenting today with about 2 months of bilateral foot and lower leg swelling worse on the left than the right.  Cardiologist started him last month on HCTZ which he states helped a bit but has not resolved the issue.  He is now having some progressively worsening pain, numbness, and slight redness to the left foot.  States he called his cardiologist this morning about this and was told to come here to make sure he did not have an infection, appointment scheduled with them for Tuesday of next week.  So far not trying anything other than compression stockings for swelling at home.    Past Medical History:  Diagnosis Date   Abnormal echocardiogram    stress myoview 10/29/2010   Arthritis    " IN MY BACK "   Dyslipidemia    Dysrhythmia    Atrial fibrillation   Hypertension    Paroxysmal atrial fibrillation (HCC)    Presence of permanent cardiac pacemaker    Sinus node dysfunction (HCC)    Sleep apnea, obstructive    sleep study 10/09/2007  AHI 5.73/hr  REM AHI 11.90/hr    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   LEFT THUMB    Stroke [facility])    Syncope    2D Echocardiogram 09/27/2010 EF between 40 to 45%   Tachycardia-bradycardia (HCC)    Transient ischemic attack (TIA) 06/07    Patient Active Problem List   Diagnosis Date Noted   Secondary hypercoagulable state (HCC) 01/24/2022   Typical atrial flutter (HCC) 01/23/2022   Acute combined systolic and diastolic heart failure (HCC) 01/23/2022   OSA (obstructive sleep apnea) 01/23/2022   Pacemaker battery depletion 12/30/2020   Tachycardia-bradycardia syndrome (HCC) 12/30/2020   History of colonic polyps 07/10/2018   Sepsis (HCC) 06/29/2018   Fever in adult >> 103 06/29/2018   Acute Toxic metabolic encephalopathy due to infection  06/29/2018   Suspected CNS infection 06/29/2018   Thrombocytopenia (HCC) 09/25/2017   Guaiac + stool 05/15/2017   Left ventricular dysfunction 12/30/2015   Coronary artery calcification seen on CT scan 09/08/2014   Ascending aortic aneurysm (HCC) 09/08/2014   AAA (abdominal aortic aneurysm) without rupture (HCC) 09/08/2014   History of TIA (transient ischemic attack) 09/08/2014   Rib fractures    Head injury    Fall    ICH (intracerebral hemorrhage) (HCC)    Dyslipidemia (high LDL; low HDL)    Essential hypertension    Syncope 08/13/2014   Intracranial hemorrhage (HCC) 08/13/2014   Anticoagulated on Coumadin    Left rib fracture 08/12/2014   Encounter for therapeutic drug monitoring 11/07/2013   Neurocardiogenic syncope 08/26/2013   Pacemaker - Medtronic REVO dual-chamber implanted August 2012 08/26/2013   HTN (hypertension) 08/26/2013   Hyperlipidemia 08/26/2013   Erectile dysfunction 08/26/2013   Atrial fibrillation (HCC) 12/14/2012   Long term current use of anticoagulant therapy 12/14/2012    Past Surgical History:  Procedure Laterality Date   COLONOSCOPY     COLONOSCOPY N/A 06/07/2017   Procedure: COLONOSCOPY;  Surgeon: Malissa Hippo, MD;  Location[facility];  Service: Endoscopy;  Laterality: N/A;  9:30   COLONOSCOPY N/A 09/20/2018   Procedure: COLONOSCOPY;  Surgeon: Malissa Hippo, MD;  Location: [facility];  Service: Endoscopy;  Laterality: N/A;  1030   ELECTROCARDIOGRAM     INSERT / REPLACE / REMOVE PACEMAKER  05/2011   LOOP RECORDER IMPLANT  02/02/2011   Medtronic- CRM    PACEMAKER INSERTION  05/30/2011   Medtronic Revo   last checked 11/07/2012   POLYPECTOMY  09/20/2018   Procedure: POLYPECTOMY;  Surgeon: Malissa Hippo, MD;  Location: [facility];  Service: Endoscopy;;  colon   PPM GENERATOR CHANGEOUT N/A 12/30/2020   Procedure: PPM GENERATOR CHANGEOUT;  Surgeon: Thurmon Fair, MD;  Location: [facility];  Service: Cardiovascular;   Laterality: N/A;       Home Medications    Prior to Admission medications   Medication Sig Start Date End Date Taking? Authorizing Provider  carvedilol (COREG) 25 MG tablet TAKE TWO TABLETS (50MG  TOTAL) BY MOUTH TWO TIMES DAILY WITH A MEAL 06/01/23   Croitoru, Mihai, MD  Cyanocobalamin (B-12 PO) Take 1 capsule by mouth daily.    [provider]  diazepam (VALIUM) 2 MG tablet Take 2 mg by mouth every 8 (eight) hours as needed for anxiety.     [provider]  dofetilide (TIKOSYN) 250 MCG capsule Take 1 capsule (250 mcg total) by mouth 2 (two) times daily. 04/24/23   Fenton, Clint R, PA  finasteride (PROSCAR) 5 MG tablet Take 5 mg by mouth daily.    [provider]  hydrochlorothiazide (MICROZIDE) 12.5 MG capsule Take 1 capsule (12.5 mg total) by mouth as needed. 08/31/23 11/29/23  Lennette Bihari, MD  irbesartan (AVAPRO) 150 MG tablet TAKE ONE TABLET (150MG  TOTAL) BY MOUTH DAILY 08/14/23   Croitoru, Rachelle Hora, MD  Misc Natural Products (NEURIVA) CAPS Take 1 capsule by mouth daily. For memory    [provider]  Multiple Vitamin (MULTIVITAMIN WITH MINERALS) TABS tablet Take 1 tablet by mouth daily. Adult Multivitamin 50+    [provider]  Polyethyl Glycol-Propyl Glycol 0.4-0.3 % SOLN Place 1 drop into both eyes 3 (three) times daily as needed (for dry eyes.).    [provider]  potassium chloride (KLOR-CON) 10 MEQ tablet Take 2 tablets (20 mEq total) by mouth 2 (two) times daily. Take 2 tablets twice daily 08/31/23   Lennette Bihari, MD  rosuvastatin (CRESTOR) 20 MG tablet TAKE ONE TABLET (20MG  TOTAL) BY MOUTH DAILY 08/08/23   Croitoru, Mihai, MD  Saw Palmetto 450 MG CAPS Take 900 mg by mouth daily.    [provider]  trospium (SANCTURA) 20 MG tablet Take 20 mg by mouth daily.    [provider]  warfarin (COUMADIN) 5 MG tablet TAKE ONE-HALF TO ONE TABLET BY MOUTH EVERY DAY AS DIRECTED BY COUMADIN CLINIC 06/21/23   Croitoru,  Mihai, MD    Family History Family History  Problem Relation Age of Onset   Hypertension Mother    Heart attack Father 25       deceased from heart attack   Hypertension Brother    Colon cancer Neg Hx     Social History Social History   Tobacco Use   Smoking status: Never   Smokeless tobacco: Never   Tobacco comments:    Never smoked 06/28/23  Vaping Use   Vaping status: Never Used  Substance Use Topics   Alcohol use: Yes    Comment: occasionally wine   Drug use: No     Allergies   Apoaequorin and Prevnar 13 [pneumococcal 13-val conj vacc]   Review of Systems Review of Systems  Per HPI  Physical Exam Triage Vital Signs ED Triage Vitals  Encounter Vitals Group     BP 10/06/23 1455 133/86     Systolic BP Percentile --      Diastolic BP Percentile --      Pulse Rate 10/06/23 1455 60     Resp 10/06/23 1455 18     Temp 10/06/23 1455 97.8 F (36.6 C)     Temp Source 10/06/23 1455 Oral     SpO2 10/06/23 1455 98 %     Weight --      Height --      Head Circumference --      Peak Flow --      Pain Score 10/06/23 1457 5     Pain Loc --      Pain Education --      Exclude from Growth Chart --    No data found.  Updated Vital Signs BP 133/86 (BP Location: Right Arm)   Pulse 60   Temp 97.8 F (36.6 C) (Oral)   Resp 18   SpO2 98%   Visual Acuity Right Eye Distance:   Left Eye Distance:   Bilateral Distance:    Right Eye Near:   Left Eye Near:    Bilateral Near:     Physical Exam Vitals and nursing note reviewed.  Constitutional:      Appearance: Normal appearance.  HENT:     Head: Atraumatic.  Eyes:     Extraocular Movements: Extraocular movements intact.     Conjunctiva/sclera: Conjunctivae normal.  Cardiovascular:     Rate and Rhythm: Normal rate and regular rhythm.     Pulses: Normal pulses.  Pulmonary:     Effort: Pulmonary effort is normal.     Breath sounds: Normal breath sounds.  Musculoskeletal:        General: Swelling and  tenderness present. No signs of injury. Normal range of motion.     Cervical back: Normal range of motion and neck supple.     Comments: Bilateral lower leg and foot edema, worse on the left  Skin:    General: Skin is warm and dry.     Comments: Left foot slightly hyperpigmented, consistent with rubor of dependency more so than a erythematous cellulitis appearance  Neurological:     General: No focal deficit present.     Mental Status: He is oriented to person, place, and time.     Motor: No weakness.     Gait: Gait normal.     Comments: Bilateral lower extremities neurovascularly intact  Psychiatric:        Mood and Affect: Mood normal.        Thought Content: Thought content normal.        Judgment: Judgment normal.      UC Treatments / Results  Labs (all labs ordered are listed, but only abnormal results are displayed) Labs Reviewed - No data to display  EKG   Radiology No results found.  Procedures Procedures (including critical care time)  Medications Ordered in UC Medications - No data to display  Initial Impression / Assessment and Plan / UC Course  I have reviewed the triage vital signs and the nursing notes.  Pertinent labs & imaging results that were available during my care of the patient were reviewed by me and considered in my medical decision making (see chart for details).     No evidence of cellulitis at this time, afebrile, no wounds appreciable and no erythema,  drainage.  Offered DVT rule out ultrasound but they wish to wait and talk with cardiologist on Tuesday prior to this.  Discussed to increase HCTZ for the next few days until this appointment to see if this helps further with symptoms in addition to consistent use of compression stockings, leg elevation.  ED for worsening symptoms at any time.  Final Clinical Impressions(s) / UC Diagnoses   Final diagnoses:  Bilateral leg edema     Discharge Instructions      Try increasing your HCTZ to  25 mg daily until you follow-up with cardiology next week.  Wear your compression stockings throughout the day, prop your feet up at rest.  Follow-up for worsening symptoms at any time    ED Prescriptions   None    PDMP not reviewed this encounter.   Particia Nearing, [place] 10/06/23 1542

## 2023-10-06 NOTE — ED Triage Notes (Signed)
Bilateral foot and lower leg swelling.  States left foot is worse. Swelling started back in November.  States left foot hurts to touch and is red on top.  States he is unable to walk long distances.

## 2023-10-08 NOTE — Progress Notes (Signed)
 Cardiology Office Note:  .   Date:  10/10/2023  ID:  Alex Maddox, DOB [birth year], MRN [id number] PCP: [name], MD  [facility] Providers Cardiologist:  Jerel Balding, MD { History of Present Illness: .   Alex Maddox is a 72 y.o. male with history of CHF, ppm, AAA, coronary calcium , HTN, HLD who presents for follow-up.   History of Present Illness   Mr. Sprung, a 72 year old male with a significant cardiac history including systolic heart failure, EF 45%, AAA, coronary calcium , hypertension, hyperlipidemia, pacemaker implant, and paroxysmal AFib, presents for a follow-up visit. The patient's primary concern is persistent lower extremity edema, particularly in the left ankle and leg, which has been ongoing for several months and has progressively worsened. Despite being on a diuretic (hydrochlorothiazide ), the swelling has only partially improved and the patient reports pain upon touch. The patient also reports a significant decrease in energy levels and increased difficulty in walking distances that were previously manageable without trouble. The patient denies any chest pain but does report shortness of breath. The patient has a history of sleep apnea and uses a CPAP machine. The patient denies any recent injuries to the legs. The patient's diet includes frequent meals at restaurants known for high-salt content, and the patient admits to consuming processed and packaged foods.          Problem List Paroxysmal Afib  Tachycardia-bradycardia s/p ppm AAA -3.5 cm  Systolic HF -EF 45% 01/25/2022 Coronary calcium  HLD HTN OSA    ROS: All other ROS reviewed and negative. Pertinent positives noted in the HPI.     Studies Reviewed: SABRA       Physical Exam:   VS:  BP 102/60 (BP Location: Left Arm, Patient Position: Sitting, Cuff Size: Normal)   Pulse (!) 59   Ht 6' (1.829 m)   Wt 200 lb 12.8 oz (91.1 kg)   SpO2 100%   BMI 27.23 kg/m    Wt Readings from Last 3 Encounters:   10/10/23 200 lb 12.8 oz (91.1 kg)  08/31/23 203 lb 3.2 oz (92.2 kg)  06/28/23 200 lb 6.4 oz (90.9 kg)    GEN: Well nourished, well developed in no acute distress NECK: No JVD; No carotid bruits CARDIAC: RRR, no murmurs, rubs, gallops RESPIRATORY:  Clear to auscultation without rales, wheezing or rhonchi  ABDOMEN: Soft, non-tender, non-distended EXTREMITIES:  No edema; No deformity  ASSESSMENT AND PLAN: .   Assessment and Plan    Lower Extremity Edema SOB Systolic HF, EF 45% Bilateral lower extremity edema, worse on the left, likely due to high salt intake and possible fluid overload in the setting of systolic heart failure or venous insufficiency. No signs of infection or DVT. -Stop Hydrochlorothiazide . -Start Lasix  20mg  daily. -Order venous ultrasounds of lower extremities to evaluate for venous insufficiency -Echo to re-evaluate LVEF -Advise patient to reduce salt intake.  Systolic Heart Failure, 45% Last echocardiogram in April of the previous year showed EF of 45%. Patient reports decreased exercise tolerance and fatigue. -Order repeat echocardiogram to assess current heart function. -Continue current heart failure medications (Irbesartan , Carvedilol ). Further titration pending medications.  -lasix  20 mg as a above.    CKD IIIA -stable per review. On Potassium supplement.   Follow-up Patient to follow up with [facility] in 6 weeks after completion of diagnostic tests and initiation of Lasix .              Follow-up: Return in about 6 weeks (around 11/21/2023).  Time  Spent with Patient: I have spent a total of 35 minutes caring for this patient today face to face, ordering and reviewing labs/tests, reviewing prior records/medical history, examining the patient, establishing an assessment and plan, communicating results/findings to the patient/family, and documenting in the medical record.   Signed, Darryle DASEN. Barbaraann, MD, [facility]  [facility]  [address], [address] [place], [place] [address] [phone]  5:35 PM

## 2023-10-09 DIAGNOSIS — D696 Thrombocytopenia, unspecified: Secondary | ICD-10-CM | POA: Diagnosis not present

## 2023-10-09 DIAGNOSIS — I129 Hypertensive chronic kidney disease with stage 1 through stage 4 chronic kidney disease, or unspecified chronic kidney disease: Secondary | ICD-10-CM | POA: Diagnosis not present

## 2023-10-09 DIAGNOSIS — N1831 Chronic kidney disease, stage 3a: Secondary | ICD-10-CM | POA: Diagnosis not present

## 2023-10-09 DIAGNOSIS — I5032 Chronic diastolic (congestive) heart failure: Secondary | ICD-10-CM | POA: Diagnosis not present

## 2023-10-10 ENCOUNTER — Encounter: Payer: Self-pay | Admitting: Cardiovascular Disease

## 2023-10-10 ENCOUNTER — Ambulatory Visit: Payer: Medicare Other | Attending: Cardiovascular Disease | Admitting: Cardiovascular Disease

## 2023-10-10 VITALS — BP 102/60 | HR 59 | Ht 72.0 in | Wt 200.8 lb

## 2023-10-10 DIAGNOSIS — I700 Atherosclerosis of aorta: Secondary | ICD-10-CM | POA: Diagnosis not present

## 2023-10-10 DIAGNOSIS — R0602 Shortness of breath: Secondary | ICD-10-CM

## 2023-10-10 DIAGNOSIS — I5022 Chronic systolic (congestive) heart failure: Secondary | ICD-10-CM

## 2023-10-10 DIAGNOSIS — R600 Localized edema: Secondary | ICD-10-CM

## 2023-10-10 DIAGNOSIS — I5032 Chronic diastolic (congestive) heart failure: Secondary | ICD-10-CM | POA: Diagnosis not present

## 2023-10-10 MED ORDER — FUROSEMIDE 20 MG PO TABS: 20.0000 mg | ORAL_TABLET | Freq: Every day | ORAL | 3 refills | Status: DC

## 2023-10-10 NOTE — Patient Instructions (Addendum)
 Medication Instructions:  - STOP Hydrochlorothiazide   - START LASIX  20MG  DAILY    *If you need a refill on your cardiac medications before your next appointment, please call your pharmacy*   Lab Work: NONE    If you have labs (blood work) drawn today and your tests are completely normal, you will receive your results only by: MyChart Message (if you have MyChart) OR A paper copy in the mail If you have any lab test that is abnormal or we need to change your treatment, we will call you to review the results.   Testing/Procedures: Echo will be scheduled at [address].  Your physician has requested that you have an echocardiogram. Echocardiography is a painless test that uses sound waves to create images of your heart. It provides your doctor with information about the size and shape of your heart and how well your heart's chambers and valves are working. This procedure takes approximately one hour. There are no restrictions for this procedure. Please do NOT wear cologne, perfume, aftershave, or lotions (deodorant is allowed). Please arrive 15 minutes prior to your appointment time.   Your physician has requested that you have a lower or upper extremity venous duplex. This test is an ultrasound of the veins in the legs or arms. It looks at venous blood flow that carries blood from the heart to the legs or arms. Allow one hour for a Lower Venous exam. Allow thirty minutes for an Upper Venous exam. There are no restrictions or special instructions.  Please note: We ask at that you not bring children with you during ultrasound (echo/ vascular) testing. Due to room size and safety concerns, children are not allowed in the ultrasound rooms during exams. Our front office staff cannot provide observation of children in our lobby area while testing is being conducted. An adult accompanying a patient to their appointment will only be allowed in the ultrasound room at the discretion of  the ultrasound technician under special circumstances. We apologize for any inconvenience.     Follow-Up: At [facility], you and your health needs are our priority.  As part of our continuing mission to provide you with exceptional heart care, we have created designated Provider Care Teams.  These Care Teams include your primary Cardiologist (physician) and Advanced Practice Providers (APPs -  Physician Assistants and Nurse Practitioners) who all work together to provide you with the care you need, when you need it.  We recommend signing up for the patient portal called MyChart.  Sign up information is provided on this After Visit Summary.  MyChart is used to connect with patients for Virtual Visits (Telemedicine).  Patients are able to view lab/test results, encounter notes, upcoming appointments, etc.  Non-urgent messages can be sent to your provider as well.   To learn more about what you can do with MyChart, go to [email or web address].    Your next appointment:   6 week(s)  The format for your next appointment:   In Person  Provider:   Josefa Beauvais, FNP, Jon Hails, PA-C, Callie Goodrich, PA-C, Kathleen Johnson, PA-C, Delon Holts, PA-C, Lamarr Satterfield, DNP, ANP, Hao Meng, PA-C, Damien Braver, NP, or Katlyn West, NP

## 2023-10-13 ENCOUNTER — Ambulatory Visit: Payer: Medicare Other | Attending: Cardiovascular Disease | Admitting: *Deleted

## 2023-10-13 DIAGNOSIS — Z5181 Encounter for therapeutic drug level monitoring: Secondary | ICD-10-CM | POA: Diagnosis not present

## 2023-10-13 DIAGNOSIS — I4891 Unspecified atrial fibrillation: Secondary | ICD-10-CM | POA: Diagnosis not present

## 2023-10-13 LAB — POCT INR: INR: 1.4 — AB (ref 2.0–3.0)

## 2023-10-13 NOTE — Patient Instructions (Signed)
 Take warfarin 1 1/2 tablets tonight then resume 1 tablet daily except for 1/2 a tablet on Monday, Wednesdays and Friday.  Recheck INR in 2 weeks.

## 2023-10-16 DIAGNOSIS — N1831 Chronic kidney disease, stage 3a: Secondary | ICD-10-CM | POA: Diagnosis not present

## 2023-10-16 DIAGNOSIS — I5022 Chronic systolic (congestive) heart failure: Secondary | ICD-10-CM | POA: Diagnosis not present

## 2023-10-16 DIAGNOSIS — I129 Hypertensive chronic kidney disease with stage 1 through stage 4 chronic kidney disease, or unspecified chronic kidney disease: Secondary | ICD-10-CM | POA: Diagnosis not present

## 2023-10-16 DIAGNOSIS — G4733 Obstructive sleep apnea (adult) (pediatric): Secondary | ICD-10-CM | POA: Diagnosis not present

## 2023-10-19 ENCOUNTER — Telehealth: Payer: Medicare Other | Admitting: Cardiovascular Disease

## 2023-10-19 ENCOUNTER — Other Ambulatory Visit (HOSPITAL_COMMUNITY): Payer: Self-pay

## 2023-10-19 ENCOUNTER — Telehealth (INDEPENDENT_AMBULATORY_CARE_PROVIDER_SITE_OTHER): Payer: Self-pay | Admitting: Gastroenterology

## 2023-10-19 NOTE — Telephone Encounter (Signed)
 Pt requesting a cb due to issues with CPAP machine

## 2023-10-19 NOTE — Telephone Encounter (Signed)
 Who is your primary care physician: Dr.Fusco  Reasons for the colonoscopy: Recall  Have you had a colonoscopy before?  yes  Do you have family history of colon cancer? no  Previous colonoscopy with polyps removed? Yes 5+ years ago  Do you have a history colorectal cancer?   no  Are you diabetic? If yes, Type 1 or Type 2?     no  Do you have a prosthetic or mechanical heart valve? no  Do you have a pacemaker/defibrillator?   yes  Have you had endocarditis/atrial fibrillation? no  Have you had joint replacement within the last 12 months?  no  Do you tend to be constipated or have to use laxatives? no  Do you have any history of drugs or alchohol?  no  Do you use supplemental oxygen?  no  Have you had a stroke or heart attack within the last 6 months? no  Do you take weight loss medication?  no      Do you take any blood-thinning medications such as: (aspirin, warfarin, Plavix, Aggrenox)  yes  If yes we need the name, milligram, dosage and who is prescribing doctor Warfarin Sodium  5 mg Sun,Tues,Thurs, Sat 1 5mg  pill Monday,Wed, Fri 1/2 5 mg pill Dr.Mihai Croitoru[place] Current Outpatient Medications on File Prior to Visit  Medication Sig Dispense Refill   carvedilol  (COREG ) 25 MG tablet TAKE TWO TABLETS (50MG  TOTAL) BY MOUTH TWO TIMES DAILY WITH A MEAL 120 tablet 6   Cyanocobalamin  (B-12 PO) Take 1 capsule by mouth daily.     diazepam (VALIUM) 2 MG tablet Take 2 mg by mouth every 8 (eight) hours as needed for anxiety.      dofetilide  (TIKOSYN ) 250 MCG capsule Take 1 capsule (250 mcg total) by mouth 2 (two) times daily. 60 capsule 6   finasteride  (PROSCAR ) 5 MG tablet Take 5 mg by mouth daily.     furosemide  (LASIX ) 20 MG tablet Take 1 tablet (20 mg total) by mouth daily. 90 tablet 3   hydrochlorothiazide  (MICROZIDE ) 12.5 MG capsule Take 1 capsule (12.5 mg total) by mouth as needed. 90 capsule 3   irbesartan  (AVAPRO ) 150 MG tablet TAKE ONE TABLET (150MG  TOTAL) BY MOUTH  DAILY 90 tablet 2   Misc Natural Products (NEURIVA) CAPS Take 1 capsule by mouth daily. For memory     Multiple Vitamin (MULTIVITAMIN WITH MINERALS) TABS tablet Take 1 tablet by mouth daily. Adult Multivitamin 50+     Polyethyl Glycol-Propyl Glycol 0.4-0.3 % SOLN Place 1 drop into both eyes 3 (three) times daily as needed (for dry eyes.).     potassium chloride  (KLOR-CON ) 10 MEQ tablet Take 2 tablets (20 mEq total) by mouth 2 (two) times daily. Take 2 tablets twice daily 120 tablet 3   rosuvastatin  (CRESTOR ) 20 MG tablet TAKE ONE TABLET (20MG  TOTAL) BY MOUTH DAILY 90 tablet 2   Saw Palmetto 450 MG CAPS Take 900 mg by mouth daily.     trospium (SANCTURA) 20 MG tablet Take 20 mg by mouth daily.     warfarin (COUMADIN ) 5 MG tablet TAKE ONE-HALF TO ONE TABLET BY MOUTH EVERY DAY AS DIRECTED BY COUMADIN  CLINIC 90 tablet 1   No current facility-administered medications on file prior to visit.    Allergies  Allergen Reactions   Apoaequorin Other (See Comments)   Prevnar 13 [Pneumococcal 13-Val Conj Vacc]     Rash, caused him to be unconscious      Pharmacy: [facility] Name: [facility]  Best number where you can be reached: [phone]

## 2023-10-19 NOTE — Telephone Encounter (Signed)
 Room 3, clearance to hold warfarin Thanks

## 2023-10-25 ENCOUNTER — Telehealth (INDEPENDENT_AMBULATORY_CARE_PROVIDER_SITE_OTHER): Payer: Self-pay | Admitting: Gastroenterology

## 2023-10-25 MED ORDER — NA SULFATE-K SULFATE-MG SULF 17.5-3.13-1.6 GM/177ML PO SOLN: ORAL | 0 refills | Status: DC

## 2023-10-25 NOTE — Addendum Note (Signed)
 Addended by: Garlon Tuggle on: 10/25/2023 02:42 PM   Modules accepted: Orders

## 2023-10-25 NOTE — Telephone Encounter (Signed)
 Pt contacted and scheduled. Prep sent to [facility] (pt requesting Suprep that he had last TCS). Instructions will be mailed to patient once pre op is received. Clearance sent for Warfarin. No PA needed per [facility]

## 2023-10-25 NOTE — Telephone Encounter (Signed)
    10/25/23  INNIS CAMIRE Feb 24, 1951  What type of surgery is being performed? COLONOSCOPY   When is surgery scheduled? 11/17/23  CLEARANCE TO HOLD WARFARIN 5 DAYS PRIOR   Name of physician performing surgery?  Dr. Samantha Cress [facility] Phone: [phone] Fax: [phone]  Anethesia type (none, local, MAC, general)? MAC

## 2023-10-26 ENCOUNTER — Ambulatory Visit: Payer: Medicare Other | Attending: Cardiovascular Disease | Admitting: *Deleted

## 2023-10-26 DIAGNOSIS — I4891 Unspecified atrial fibrillation: Secondary | ICD-10-CM | POA: Diagnosis not present

## 2023-10-26 DIAGNOSIS — Z5181 Encounter for therapeutic drug level monitoring: Secondary | ICD-10-CM | POA: Diagnosis not present

## 2023-10-26 LAB — POCT INR: INR: 1.4 — AB (ref 2.0–3.0)

## 2023-10-26 NOTE — Patient Instructions (Signed)
Take warfarin 1 1/2 tablets tonight then increase dose to 1 tablet daily except for 1/2 a tablet on Monday Recheck INR in 2 weeks.  Pending colonoscopy on 11/17/23

## 2023-10-27 NOTE — Telephone Encounter (Signed)
Pharmacy please advise on holding Coumadin prior to Colonoscopy scheduled for 11/17/23. Thank you.

## 2023-10-31 ENCOUNTER — Encounter (INDEPENDENT_AMBULATORY_CARE_PROVIDER_SITE_OTHER): Payer: Self-pay | Admitting: *Deleted

## 2023-10-31 NOTE — Telephone Encounter (Signed)
Patient with diagnosis of A Fib on warfarin for anticoagulation.     Procedure: colonoscopy Date of procedure: 11/17/23     CHA2DS2-VASc Score = 6  This indicates a 9.7% annual risk of stroke. The patient's score is based upon: CHF History: 1 HTN History: 1 Diabetes History: 0 Stroke History: 2 Vascular Disease History: 1 Age Score: 1 Gender Score: 0     CrCl 60 ml/min Platelet count 140K   Patient can hold warfarin for 5 days prior to procedure. Per Dr. Royann Shivers, patient WILL need Lovenox bridge around procedure. Will make Coumadin clinic aware   **This guidance is not considered finalized until pre-operative APP has relayed final recommendations.**

## 2023-10-31 NOTE — Telephone Encounter (Signed)
Called and left VM on cell phone informing pt he will have to do Lovenox bridge while holding warfarin for colonoscopy.  He has appt with me on 11/09/23.  Will give him detailed instructions at  that visit.

## 2023-10-31 NOTE — Telephone Encounter (Signed)
   Patient Name: Alex Maddox  DOB: 1950/12/10 MRN: [id number]  Primary Cardiologist: Thurmon Fair, MD  Clinical pharmacists have reviewed the patient's past medical history, labs, and current medications as part of preoperative protocol coverage. The following recommendations have been made:   Patient with diagnosis of A Fib on warfarin for anticoagulation.     Procedure: colonoscopy Date of procedure: 11/17/23     CHA2DS2-VASc Score = 6  This indicates a 9.7% annual risk of stroke. The patient's score is based upon: CHF History: 1 HTN History: 1 Diabetes History: 0 Stroke History: 2 Vascular Disease History: 1 Age Score: 1 Gender Score: 0     CrCl 60 ml/min Platelet count 140K   Patient can hold warfarin for 5 days prior to procedure. Per Dr. Royann Shivers, patient WILL need Lovenox bridge around procedure.   Pharmacy team will make Coumadin clinic aware  I will route this recommendation to the requesting party via Epic fax function and remove from pre-op pool.  Please call with questions.  Joylene Grapes, NP 10/31/2023, 12:31 PM

## 2023-10-31 NOTE — Telephone Encounter (Signed)
Patient with diagnosis of A Fib on warfarin for anticoagulation.    Procedure: colonoscopy Date of procedure: 11/17/23   CHA2DS2-VASc Score = 6  This indicates a 9.7% annual risk of stroke. The patient's score is based upon: CHF History: 1 HTN History: 1 Diabetes History: 0 Stroke History: 2 Vascular Disease History: 1 Age Score: 1 Gender Score: 0   CrCl 60 ml/min Platelet count 140K   Patient has history of TIA/stroke on PMH but unclear date. Will route to Dr Royann Shivers for input if Lovenox bridge is necessary.   **This guidance is not considered finalized until pre-operative APP has relayed final recommendations.**

## 2023-10-31 NOTE — Telephone Encounter (Signed)
Spoke with patient. Pt in need of new mask and hose for PAP device. Notified patient that I will reach out to his insurance carrier for their DME preference.

## 2023-10-31 NOTE — Telephone Encounter (Signed)
Referral completed, TCS apt letter sent to PCP

## 2023-10-31 NOTE — Telephone Encounter (Signed)
The stroke is remote (over 5 years) but he has a very high burden of paroxysmal atrial fibrillation. I do recommend bridging.

## 2023-11-01 ENCOUNTER — Telehealth: Payer: Self-pay | Admitting: Cardiovascular Disease

## 2023-11-01 ENCOUNTER — Other Ambulatory Visit: Payer: Self-pay

## 2023-11-01 DIAGNOSIS — D696 Thrombocytopenia, unspecified: Secondary | ICD-10-CM

## 2023-11-01 NOTE — Progress Notes (Signed)
Remote pacemaker transmission.   

## 2023-11-01 NOTE — Telephone Encounter (Signed)
Wife Olegario Messier) called to follow-up with Merry Proud regarding changing patient's CPAP mask and readjusting his machine from 7 back down as patient has been running out of water at night.

## 2023-11-02 ENCOUNTER — Inpatient Hospital Stay: Payer: Medicare Other | Attending: Oncology | Admitting: Oncology

## 2023-11-02 ENCOUNTER — Inpatient Hospital Stay: Payer: Medicare Other

## 2023-11-02 ENCOUNTER — Telehealth: Payer: Self-pay

## 2023-11-02 VITALS — BP 104/69 | HR 72 | Temp 98.0°F | Resp 18 | Ht 72.0 in | Wt 194.0 lb

## 2023-11-02 DIAGNOSIS — R748 Abnormal levels of other serum enzymes: Secondary | ICD-10-CM | POA: Insufficient documentation

## 2023-11-02 DIAGNOSIS — Z7901 Long term (current) use of anticoagulants: Secondary | ICD-10-CM | POA: Diagnosis not present

## 2023-11-02 DIAGNOSIS — I509 Heart failure, unspecified: Secondary | ICD-10-CM | POA: Diagnosis not present

## 2023-11-02 DIAGNOSIS — N189 Chronic kidney disease, unspecified: Secondary | ICD-10-CM | POA: Diagnosis not present

## 2023-11-02 DIAGNOSIS — M7989 Other specified soft tissue disorders: Secondary | ICD-10-CM | POA: Insufficient documentation

## 2023-11-02 DIAGNOSIS — D696 Thrombocytopenia, unspecified: Secondary | ICD-10-CM

## 2023-11-02 DIAGNOSIS — I4891 Unspecified atrial fibrillation: Secondary | ICD-10-CM | POA: Diagnosis not present

## 2023-11-02 DIAGNOSIS — Z79899 Other long term (current) drug therapy: Secondary | ICD-10-CM | POA: Insufficient documentation

## 2023-11-02 LAB — CBC WITH DIFFERENTIAL/PLATELET
Abs Immature Granulocytes: 0.02 10*3/uL (ref 0.00–0.07)
Basophils Absolute: 0 10*3/uL (ref 0.0–0.1)
Basophils Relative: 1 %
Eosinophils Absolute: 0.2 10*3/uL (ref 0.0–0.5)
Eosinophils Relative: 4 %
HCT: 47 % (ref 39.0–52.0)
Hemoglobin: 15.2 g/dL (ref 13.0–17.0)
Immature Granulocytes: 0 %
Lymphocytes Relative: 22 %
Lymphs Abs: 1.1 10*3/uL (ref 0.7–4.0)
MCH: 30.6 pg (ref 26.0–34.0)
MCHC: 32.3 g/dL (ref 30.0–36.0)
MCV: 94.8 fL (ref 80.0–100.0)
Monocytes Absolute: 0.5 10*3/uL (ref 0.1–1.0)
Monocytes Relative: 10 %
Neutro Abs: 3.1 10*3/uL (ref 1.7–7.7)
Neutrophils Relative %: 63 %
Platelets: 122 10*3/uL — ABNORMAL LOW (ref 150–400)
RBC: 4.96 MIL/uL (ref 4.22–5.81)
RDW: 12.1 % (ref 11.5–15.5)
WBC: 4.9 10*3/uL (ref 4.0–10.5)
nRBC: 0 % (ref 0.0–0.2)

## 2023-11-02 LAB — COMPREHENSIVE METABOLIC PANEL
ALT: 66 U/L — ABNORMAL HIGH (ref 0–44)
AST: 44 U/L — ABNORMAL HIGH (ref 15–41)
Albumin: 3.8 g/dL (ref 3.5–5.0)
Alkaline Phosphatase: 68 U/L (ref 38–126)
Anion gap: 5 (ref 5–15)
BUN: 24 mg/dL — ABNORMAL HIGH (ref 8–23)
CO2: 28 mmol/L (ref 22–32)
Calcium: 9.9 mg/dL (ref 8.9–10.3)
Chloride: 104 mmol/L (ref 98–111)
Creatinine, Ser: 1.38 mg/dL — ABNORMAL HIGH (ref 0.61–1.24)
GFR, Estimated: 55 mL/min — ABNORMAL LOW (ref >60)
Glucose, Bld: 139 mg/dL — ABNORMAL HIGH (ref 70–99)
Potassium: 4.2 mmol/L (ref 3.5–5.1)
Sodium: 137 mmol/L (ref 135–145)
Total Bilirubin: 0.8 mg/dL (ref 0.0–1.2)
Total Protein: 6.6 g/dL (ref 6.5–8.1)

## 2023-11-02 NOTE — Telephone Encounter (Signed)
Left VM for patient with Dr. Norris Cross recommendation of using humidifier at night. Left callback number for questions and/or concerns.

## 2023-11-02 NOTE — Progress Notes (Signed)
[facility] [address], [place] [address]   Clinic Day:  11/03/23   Referring physician: Assunta Found, MD  Patient Care Team: Assunta Found, MD as PCP - General (Family Medicine) Thurmon Fair, MD as PCP - Cardiology (Cardiology) Lisbeth Renshaw, MD as Consulting Physician (Neurosurgery) Glendale Chard, DO as Consulting Physician (Neurology)   ASSESSMENT & PLAN:   Assessment:  1.  Mild thrombocytopenia: - Patient seen at the request of Dr. Phillips Odor for thrombocytopenia. - Recent CBC in May showed platelet count was 101 with normal hemoglobin and white count. - He is on Coumadin for more than 10 years.  He has easy bruising and bleeding as a result.  He also takes saw palmetto for many years.  No recent herbal supplements.  Denies any quinine supplements or tonic water.  No B symptoms.  2.  Social/family history: - Lives with wife at home.  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  Has exposure to chemicals and Roundup.  Non-smoker. - No family history of thrombocytopenia.  Mother had ovarian cancer.  Paternal cousin has lung cancer and paternal uncle had lung cancer.  Plan:  1.  Mild thrombocytopenia: - Hematology workup from 05/04/2023 showed no evidence of nutritional deficiencies with normal copper and MMA or infection with nonreactive hep B and hep C results.  No evidence of connective tissue disorders with normal ANA and rheumatoid factor.  No evidence of underlying MDS with normal protein electrophoresis and immunofixation.  Reticulocyte count is also normal.  Ultrasound of the spleen is within normal limits. 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 -Recommend active surveillance every 4 to 5 months. -Labs from 11/02/2023 show platelet count 122.  -Discussed continuing to monitor and if platelets drop below 50,000 would consider treatment with steroids.   2. Elevated Liver enzymes -Liver enzymes from 11/02/2023 are  slightly elevated.  AST 44 ALT 66-mild. -He was recently started on Lasix and his HCTZ was increased.  No new supplements. -Will continue to monitor.  3.  CKD -Has had fluctuation in his creatinine dating back to 2020. -Baseline creatinine ranges from normal to 1.59.  4.  Atrial fibrillation -Patient on warfarin. -He is followed by cardiology.  5.  Systolic heart failure: -Has upcoming echocardiogram. -Previous echo was from April 2024 which showed EF of 45%. -She was instructed to continue carvedilol and irbesartan.  -She continues Lasix 20 mg.  6.  Left lower extremity swelling: -He is scheduled for a Doppler on 11/08/2023. -He is currently on Coumadin which makes this less likely to be a blood clot. -Continue TED hose and diuretics per cardiology.  PLAN SUMMARY: >> Return to clinic in 4 to 6 months for follow-up with labs and see a provider.     No orders of the defined types were placed in this encounter.   [name], NP   1/24/20258:44 AM  CHIEF COMPLAINT/PURPOSE OF CONSULT:   Diagnosis: thrombocytopenia  Current Therapy:  none  HISTORY OF PRESENT ILLNESS:   Alex Maddox is a 73 y.o. male presenting to clinic today for follow-up for thrombocytopenia.  He was evaluated at urgent care on 10/06/2023 for bilateral lower extremity swelling.  Symptoms have been present for greater than 2 months.  Cardiology started him on HCTZ which helped some.  He was encouraged to increase his HCTZ and follow-up with cardiology.  Patient is scheduled for colonoscopy on 11/17/2023.  He was advised to hold his Coumadin for 5 days prior to procedure with  Lovenox bridge.  He is here to review recent lab results.  Continues to notice some easy bruising and bleeding.  He still remains on Coumadin for greater than 12 years for 2 TIAs.  Denies any new medication. He denies taking quinine supplements, tonic water, or eating walnuts. He denies, epistaxis, fever, night sweats.   Reports he has  been having some lower extremity swelling (L> R) and is currently wearing and elevating his legs which appears to be helping.  Denies any pain to his lower extremities although he reports tenderness to his left foot at times.  Over Christmas, he was placed on Lasix and his HCTZ was increased which seems to be helping.  He has reduced his salt intake as well.  His wife states they are trying to change their diet.  They are no longer eating out.    He is scheduled for an echocardiogram and left lower extremity Doppler to rule out a blood clot on 11/08/2023.  Cardiology thinks this is less likely given he is on Coumadin.  Has some shortness of breath with exertion.  Appetite is 100% energy levels are 65%.   PAST MEDICAL HISTORY:   Past Medical History: Past Medical History:  Diagnosis Date   Abnormal echocardiogram    stress myoview 10/29/2010   Arthritis    " IN MY BACK "   Dyslipidemia    Dysrhythmia    Atrial fibrillation   Hypertension    Paroxysmal atrial fibrillation (HCC)    Presence of permanent cardiac pacemaker    Sinus node dysfunction (HCC)    Sleep apnea, obstructive    sleep study 10/09/2007  AHI 5.73/hr  REM AHI 11.90/hr    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   LEFT THUMB    Stroke [facility])    Syncope    2D Echocardiogram 09/27/2010 EF between 40 to 45%   Tachycardia-bradycardia (HCC)    Transient ischemic attack (TIA) 06/07    Surgical History: Past Surgical History:  Procedure Laterality Date   COLONOSCOPY     COLONOSCOPY N/A 06/07/2017   Procedure: COLONOSCOPY;  Surgeon: Malissa Hippo, MD;  Location: [facility];  Service: Endoscopy;  Laterality: N/A;  9:30   COLONOSCOPY N/A 09/20/2018   Procedure: COLONOSCOPY;  Surgeon: Malissa Hippo, MD;  Location: [facility];  Service: Endoscopy;  Laterality: N/A;  1030   ELECTROCARDIOGRAM     INSERT / REPLACE / REMOVE PACEMAKER  05/2011   LOOP RECORDER IMPLANT  02/02/2011   Medtronic- CRM    PACEMAKER  INSERTION  05/30/2011   Medtronic Revo   last checked 11/07/2012   POLYPECTOMY  09/20/2018   Procedure: POLYPECTOMY;  Surgeon: Malissa Hippo, MD;  Location: [facility];  Service: Endoscopy;;  colon   PPM GENERATOR CHANGEOUT N/A 12/30/2020   Procedure: PPM GENERATOR CHANGEOUT;  Surgeon: Thurmon Fair, MD;  Location: [facility] INVASIVE CV LAB;  Service: Cardiovascular;  Laterality: N/A;    Social History: Social History   Socioeconomic History   Marital status: Married    Spouse name: Not on file   Number of children: 0   Years of education: Not on file   Highest education level: Not on file  Occupational History   Not on file  Tobacco Use   Smoking status: Never   Smokeless tobacco: Never   Tobacco comments:    Never smoked 06/28/23  Vaping Use   Vaping status: Never Used  Substance and Sexual Activity   Alcohol  use: Yes    Comment: occasionally wine   Drug use: No   Sexual activity: Not on file  Other Topics Concern   Not on file  Social History Narrative   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    Lives in a two story home   Social Drivers of Health   Financial Resource Strain: Not on file  Food Insecurity: Not on file  Transportation Needs: Not on file  Physical Activity: Not on file  Stress: Not on file  Social Connections: Not on file  Intimate Partner Violence: Not on file    Family History: Family History  Problem Relation Age of Onset   Hypertension Mother    Heart attack Father 76       deceased from heart attack   Hypertension Brother    Colon cancer Neg Hx     Current Medications:  Current Outpatient Medications:    carvedilol (COREG) 25 MG tablet, TAKE TWO TABLETS (50MG  TOTAL) BY MOUTH TWO TIMES DAILY WITH A MEAL, Disp: 120 tablet, Rfl: 6   Cyanocobalamin (B-12 PO), Take 1 capsule by mouth daily., Disp: , Rfl:    diazepam (VALIUM) 2 MG tablet, Take 2 mg by mouth every 8 (eight) hours as needed for anxiety. , Disp: ,  Rfl:    dofetilide (TIKOSYN) 250 MCG capsule, Take 1 capsule (250 mcg total) by mouth 2 (two) times daily., Disp: 60 capsule, Rfl: 6   finasteride (PROSCAR) 5 MG tablet, Take 5 mg by mouth daily., Disp: , Rfl:    furosemide (LASIX) 20 MG tablet, Take 1 tablet (20 mg total) by mouth daily., Disp: 90 tablet, Rfl: 3   hydrochlorothiazide (MICROZIDE) 12.5 MG capsule, Take 1 capsule (12.5 mg total) by mouth as needed., Disp: 90 capsule, Rfl: 3   irbesartan (AVAPRO) 150 MG tablet, TAKE ONE TABLET (150MG  TOTAL) BY MOUTH DAILY, Disp: 90 tablet, Rfl: 2   Misc Natural Products (NEURIVA) CAPS, Take 1 capsule by mouth daily. For memory, Disp: , Rfl:    Multiple Vitamin (MULTIVITAMIN WITH MINERALS) TABS tablet, Take 1 tablet by mouth daily. Adult Multivitamin 50+, Disp: , Rfl:    Na Sulfate-K Sulfate-Mg Sulfate concentrate 17.5-3.13-1.6 GM/177ML SOLN, Use as directed, Disp: 354 mL, Rfl: 0   Polyethyl Glycol-Propyl Glycol 0.4-0.3 % SOLN, Place 1 drop into both eyes 3 (three) times daily as needed (for dry eyes.)., Disp: , Rfl:    potassium chloride (KLOR-CON) 10 MEQ tablet, Take 2 tablets (20 mEq total) by mouth 2 (two) times daily. Take 2 tablets twice daily, Disp: 120 tablet, Rfl: 3   rosuvastatin (CRESTOR) 20 MG tablet, TAKE ONE TABLET (20MG  TOTAL) BY MOUTH DAILY, Disp: 90 tablet, Rfl: 2   Saw Palmetto 450 MG CAPS, Take 900 mg by mouth daily., Disp: , Rfl:    trospium (SANCTURA) 20 MG tablet, Take 20 mg by mouth daily., Disp: , Rfl:    warfarin (COUMADIN) 5 MG tablet, TAKE ONE-HALF TO ONE TABLET BY MOUTH EVERY DAY AS DIRECTED BY COUMADIN CLINIC, Disp: 90 tablet, Rfl: 1   Allergies: Allergies  Allergen Reactions   Apoaequorin Other (See Comments)   Prevnar 13 [Pneumococcal 13-Val Conj Vacc]     Rash, caused him to be unconscious     REVIEW OF SYSTEMS:   Review of Systems  Respiratory:  Positive for shortness of breath.   Cardiovascular:  Positive for leg swelling (L>R).  Neurological:  Positive for  numbness.  Psychiatric/Behavioral:  The patient is  nervous/anxious.      VITALS:   Blood pressure 104/69, pulse 72, temperature 98 F (36.7 C), temperature source Oral, resp. rate 18, height 6' (1.829 m), weight 194 lb (88 kg), SpO2 98%.  Wt Readings from Last 3 Encounters:  11/02/23 194 lb (88 kg)  10/10/23 200 lb 12.8 oz (91.1 kg)  08/31/23 203 lb 3.2 oz (92.2 kg)    Body mass index is 26.31 kg/m.   PHYSICAL EXAM:   Physical Exam Constitutional:      Appearance: Normal appearance.  Cardiovascular:     Rate and Rhythm: Normal rate and regular rhythm.  Pulmonary:     Effort: Pulmonary effort is normal.     Breath sounds: Normal breath sounds.  Abdominal:     General: Bowel sounds are normal.     Palpations: Abdomen is soft.  Musculoskeletal:        General: No swelling. Normal range of motion.  Neurological:     Mental Status: He is alert and oriented to person, place, and time. Mental status is at baseline.     LABS:      Latest Ref Rng & Units 11/02/2023    9:33 AM 04/26/2023   10:15 AM 12/24/2020    4:06 PM  CBC  WBC 4.0 - 10.5 K/uL 4.9  6.4  6.3   Hemoglobin 13.0 - 17.0 g/dL 98.1  19.1  47.8   Hematocrit 39.0 - 52.0 % 47.0  46.8  45.7   Platelets 150 - 400 K/uL 122  118  133       Latest Ref Rng & Units 11/02/2023    9:33 AM 08/17/2023    4:01 PM 06/28/2023   10:46 AM  CMP  Glucose 70 - 99 mg/dL 295  621  308   BUN 8 - 23 mg/dL 24  19  17    Creatinine 0.61 - 1.24 mg/dL 6.57  8.46  9.62   Sodium 135 - 145 mmol/L 137  142  137   Potassium 3.5 - 5.1 mmol/L 4.2  4.9  3.6   Chloride 98 - 111 mmol/L 104  107  106   CO2 22 - 32 mmol/L 28  24  24    Calcium 8.9 - 10.3 mg/dL 9.9  95.2  9.5   Total Protein 6.5 - 8.1 g/dL 6.6     Total Bilirubin 0.0 - 1.2 mg/dL 0.8     Alkaline Phos 38 - 126 U/L 68     AST 15 - 41 U/L 44     ALT 0 - 44 U/L 66        No results found for: "CEA1", "CEA" / No results found for: "CEA1", "CEA" No results found for: "PSA1" No  results found for: "CAN199" No results found for: "CAN125"  Lab Results  Component Value Date   TOTALPROTELP 6.2 04/26/2023   TOTALPROTELP 6.1 04/26/2023   ALBUMINELP 3.4 04/26/2023   A1GS 0.2 04/26/2023   A2GS 0.8 04/26/2023   BETS 0.9 04/26/2023   GAMS 0.9 04/26/2023   MSPIKE Not Observed 04/26/2023   SPEI Comment 04/26/2023   No results found for: "TIBC", "FERRITIN", "IRONPCTSAT" Lab Results  Component Value Date   LDH 139 04/26/2023     STUDIES:   No results found.

## 2023-11-08 ENCOUNTER — Encounter: Payer: Self-pay | Admitting: Cardiovascular Disease

## 2023-11-08 ENCOUNTER — Ambulatory Visit (HOSPITAL_COMMUNITY)
Admission: RE | Admit: 2023-11-08 | Discharge: 2023-11-08 | Disposition: A | Discharge status: Home / Self Care | Payer: Medicare Other | Source: Ambulatory Visit | Attending: Cardiovascular Disease | Admitting: Cardiovascular Disease

## 2023-11-08 ENCOUNTER — Ambulatory Visit (HOSPITAL_BASED_OUTPATIENT_CLINIC_OR_DEPARTMENT_OTHER)
Admission: RE | Admit: 2023-11-08 | Discharge: 2023-11-08 | Disposition: A | Discharge status: Home / Self Care | Payer: Medicare Other | Source: Ambulatory Visit | Attending: Cardiovascular Disease | Admitting: Cardiovascular Disease

## 2023-11-08 DIAGNOSIS — R0602 Shortness of breath: Secondary | ICD-10-CM | POA: Diagnosis not present

## 2023-11-08 DIAGNOSIS — R600 Localized edema: Secondary | ICD-10-CM | POA: Diagnosis not present

## 2023-11-08 LAB — ECHOCARDIOGRAM COMPLETE
AR max vel: 3.83 cm2
AV Area VTI: 3.89 cm2
AV Area mean vel: 3.44 cm2
AV Mean grad: 2 mm[Hg]
AV Peak grad: 4.6 mm[Hg]
AV Vena cont: 0.24 cm
Ao pk vel: 1.07 m/s
Area-P 1/2: 3.23 cm2
P 1/2 time: 643 ms
S' Lateral: 2.86 cm

## 2023-11-09 ENCOUNTER — Ambulatory Visit: Payer: Medicare Other | Attending: Cardiovascular Disease | Admitting: *Deleted

## 2023-11-09 ENCOUNTER — Inpatient Hospital Stay: Payer: Medicare Other

## 2023-11-09 DIAGNOSIS — I4891 Unspecified atrial fibrillation: Secondary | ICD-10-CM

## 2023-11-09 DIAGNOSIS — Z5181 Encounter for therapeutic drug level monitoring: Secondary | ICD-10-CM | POA: Diagnosis not present

## 2023-11-09 LAB — POCT INR: INR: 1.5 — AB (ref 2.0–3.0)

## 2023-11-09 MED ORDER — ENOXAPARIN SODIUM 150 MG/ML IJ SOSY: 150.0000 mg | PREFILLED_SYRINGE | INTRAMUSCULAR | 0 refills | Status: DC

## 2023-11-09 NOTE — Patient Instructions (Signed)
Procedure:  Colonoscopy on 11/17/23  Labs:  11/02/23   Hgb 15.2   Hct 47.0   Plts 122   Scr 1.38   CrCl 63.26   Wt 91.1kg  Lovenox 150mg  once daily at 7am   #10 syringes sent to [facility].  11/11/23  Last dose of warfarin 11/12/23  No Lovenox or warfarin 2/3 - 2/6  Lovenox 150mg  SQ daily at 7am 2/7  No Lovenox in am ---colonoscopy---- Warfarin 10mg  pm 2/8  Lovenox 150mg   @ 7am and warfarin 7.5mg  pm 2/9 - 2/10  Lovenox 150mg   @ 7am and warfarin 5mg  pm 2/11  Lovenox 150mg   @ 7am and warfarin 7.5mg  pm 2/12  Lovenox 150mg   @ 7am and warfarin 5mg  pm 2/13  Lovenox 150mg   @ 7am and INR appt at 2:45pm

## 2023-11-10 ENCOUNTER — Telehealth (INDEPENDENT_AMBULATORY_CARE_PROVIDER_SITE_OTHER): Payer: Self-pay | Admitting: Gastroenterology

## 2023-11-10 DIAGNOSIS — N182 Chronic kidney disease, stage 2 (mild): Secondary | ICD-10-CM | POA: Diagnosis not present

## 2023-11-10 DIAGNOSIS — I5032 Chronic diastolic (congestive) heart failure: Secondary | ICD-10-CM | POA: Diagnosis not present

## 2023-11-10 DIAGNOSIS — I480 Paroxysmal atrial fibrillation: Secondary | ICD-10-CM | POA: Diagnosis not present

## 2023-11-10 NOTE — Telephone Encounter (Signed)
Wife left message.Pt is scheduled for TCS on 11/17/23. Pt recently seen cardiology and they state that pt is having issues with veins in legs swelling. Wife is wanting to know if it is OK to proceed with colonoscopy? Please advise. Thank you  Returned call to patient and left message that I would send a message back to provider.

## 2023-11-12 ENCOUNTER — Encounter: Payer: Self-pay | Admitting: Cardiovascular Disease

## 2023-11-13 NOTE — Telephone Encounter (Signed)
Pt has been made aware that he didn't need to do anything else in plan for his upcoming Colonoscopy.  They were appreciative of the call back.

## 2023-11-13 NOTE — Telephone Encounter (Signed)
After chart review, it appears patient has received instructions for Lovenox bridging.  Patient does not need any further testing prior to his upcoming procedure.  Please contact patient and let them know that they may proceed with planned colonoscopy.  Thank you.  Thomasene Ripple. Bernard Slayden NP-C     11/13/2023, 12:03 PM [facility] [address] Office [phone] Fax [phone]

## 2023-11-13 NOTE — Telephone Encounter (Signed)
Pt spouse called back in asking if there is anything else they need to do or has the pt been cleared. Please advise.

## 2023-11-13 NOTE — Telephone Encounter (Signed)
I see that he has been followed for varicose veins.  However his heart has been working adequately. He should be fine with proceeding with a colonoscopy.  Please make sure that he was able to come off his warfarin as requested.

## 2023-11-13 NOTE — Telephone Encounter (Signed)
Pt wife contacted and verbalized understanding.  

## 2023-11-14 ENCOUNTER — Encounter (HOSPITAL_COMMUNITY)
Admission: RE | Admit: 2023-11-14 | Discharge: 2023-11-14 | Disposition: A | Discharge status: Home / Self Care | Payer: Medicare Other | Source: Ambulatory Visit | Attending: Gastroenterology | Admitting: Gastroenterology

## 2023-11-14 ENCOUNTER — Encounter (HOSPITAL_COMMUNITY): Payer: Self-pay

## 2023-11-14 ENCOUNTER — Other Ambulatory Visit: Payer: Self-pay

## 2023-11-15 ENCOUNTER — Encounter: Payer: Self-pay | Admitting: Cardiovascular Disease

## 2023-11-15 NOTE — Progress Notes (Signed)
 PERIOPERATIVE PRESCRIPTION FOR IMPLANTED CARDIAC DEVICE PROGRAMMING  Patient Information: Name:  Alex Maddox  DOB:  1951/05/09  MRN:  [id number]  Planned Procedure:  colonoscopy with propofol  Surgeon:  Dr. Eartha Date of Procedure:  11/17/2023 Position during surgery:  not noted  Device Information:  Clinic EP Physician:  Dr. Jerel Croitoru  Device Type:  Pacemaker Manufacturer and Phone #:  Medtronic: [phone] Pacemaker Dependent?:  No. Date of Last Device Check:  09/28/2023 Normal Device Function?:  Yes.    Electrophysiologist's Recommendations:  Have magnet available. Provide continuous ECG monitoring when magnet is used or reprogramming is to be performed.  Procedure should not interfere with device function.  No device programming or magnet placement needed.  Per Device Clinic Standing Orders, [name], RN  1:55 PM 11/15/2023

## 2023-11-17 ENCOUNTER — Encounter (HOSPITAL_COMMUNITY): Payer: Self-pay | Admitting: Gastroenterology

## 2023-11-17 ENCOUNTER — Ambulatory Visit (HOSPITAL_COMMUNITY): Payer: Medicare Other | Admitting: Certified Registered"

## 2023-11-17 ENCOUNTER — Ambulatory Visit (HOSPITAL_COMMUNITY)
Admission: RE | Admit: 2023-11-17 | Discharge: 2023-11-17 | Disposition: A | Discharge status: Home / Self Care | Payer: Medicare Other | Attending: Gastroenterology | Admitting: Gastroenterology

## 2023-11-17 ENCOUNTER — Encounter (HOSPITAL_COMMUNITY)
Admission: RE | Disposition: A | Discharge status: Home / Self Care | Payer: Self-pay | Source: Home / Self Care | Attending: Gastroenterology

## 2023-11-17 ENCOUNTER — Other Ambulatory Visit: Payer: Self-pay

## 2023-11-17 DIAGNOSIS — D120 Benign neoplasm of cecum: Secondary | ICD-10-CM

## 2023-11-17 DIAGNOSIS — Z8673 Personal history of transient ischemic attack (TIA), and cerebral infarction without residual deficits: Secondary | ICD-10-CM | POA: Insufficient documentation

## 2023-11-17 DIAGNOSIS — D122 Benign neoplasm of ascending colon: Secondary | ICD-10-CM | POA: Insufficient documentation

## 2023-11-17 DIAGNOSIS — Z1211 Encounter for screening for malignant neoplasm of colon: Secondary | ICD-10-CM | POA: Diagnosis not present

## 2023-11-17 DIAGNOSIS — G4733 Obstructive sleep apnea (adult) (pediatric): Secondary | ICD-10-CM | POA: Insufficient documentation

## 2023-11-17 DIAGNOSIS — I509 Heart failure, unspecified: Secondary | ICD-10-CM | POA: Diagnosis not present

## 2023-11-17 DIAGNOSIS — I480 Paroxysmal atrial fibrillation: Secondary | ICD-10-CM | POA: Insufficient documentation

## 2023-11-17 DIAGNOSIS — I2510 Atherosclerotic heart disease of native coronary artery without angina pectoris: Secondary | ICD-10-CM | POA: Diagnosis not present

## 2023-11-17 DIAGNOSIS — Z7901 Long term (current) use of anticoagulants: Secondary | ICD-10-CM | POA: Diagnosis not present

## 2023-11-17 DIAGNOSIS — K648 Other hemorrhoids: Secondary | ICD-10-CM | POA: Diagnosis not present

## 2023-11-17 DIAGNOSIS — Z860100 Personal history of colon polyps, unspecified: Secondary | ICD-10-CM

## 2023-11-17 DIAGNOSIS — D123 Benign neoplasm of transverse colon: Secondary | ICD-10-CM | POA: Diagnosis not present

## 2023-11-17 DIAGNOSIS — I110 Hypertensive heart disease with heart failure: Secondary | ICD-10-CM | POA: Diagnosis not present

## 2023-11-17 DIAGNOSIS — K635 Polyp of colon: Secondary | ICD-10-CM | POA: Diagnosis not present

## 2023-11-17 HISTORY — PX: COLONOSCOPY WITH PROPOFOL: SHX5780

## 2023-11-17 HISTORY — PX: POLYPECTOMY: SHX5525

## 2023-11-17 LAB — HM COLONOSCOPY

## 2023-11-17 SURGERY — COLONOSCOPY WITH PROPOFOL
Anesthesia: General

## 2023-11-17 MED ORDER — LIDOCAINE HCL (PF) 2 % IJ SOLN
INTRAMUSCULAR | Status: AC
  Filled 2023-11-17: qty 5

## 2023-11-17 MED ORDER — PROPOFOL 500 MG/50ML IV EMUL
INTRAVENOUS | Status: DC | PRN
  Administered 2023-11-17: 125 ug/kg/min via INTRAVENOUS

## 2023-11-17 MED ORDER — PHENYLEPHRINE 80 MCG/ML (10ML) SYRINGE FOR IV PUSH (FOR BLOOD PRESSURE SUPPORT)
PREFILLED_SYRINGE | INTRAVENOUS | Status: DC | PRN
  Administered 2023-11-17 (×3): 80 ug via INTRAVENOUS

## 2023-11-17 MED ORDER — PROPOFOL 1000 MG/100ML IV EMUL
INTRAVENOUS | Status: AC
  Filled 2023-11-17: qty 100

## 2023-11-17 MED ORDER — PROPOFOL 10 MG/ML IV BOLUS
INTRAVENOUS | Status: DC | PRN
  Administered 2023-11-17: 60 mg via INTRAVENOUS

## 2023-11-17 MED ORDER — LIDOCAINE HCL (CARDIAC) PF 100 MG/5ML IV SOSY
PREFILLED_SYRINGE | INTRAVENOUS | Status: DC | PRN
  Administered 2023-11-17: 60 mg via INTRATRACHEAL

## 2023-11-17 MED ORDER — LACTATED RINGERS IV SOLN: INTRAVENOUS | Status: DC | PRN

## 2023-11-17 NOTE — H&P (Signed)
 Alex Maddox is an 73 y.o. male.   Chief Complaint: history colon polyps HPI: 73 year old male with past medical history of hypertension, A-fib, hypertension, sleep apnea, stroke, coming for history of colonic polyps.  The patient denies having any nausea, vomiting, fever, chills, hematochezia, melena, hematemesis, abdominal distention, abdominal pain, diarrhea, jaundice, pruritus or weight loss.   Past Medical History:  Diagnosis Date   Abnormal echocardiogram    stress myoview 10/29/2010   Arthritis     IN MY BACK    Dyslipidemia    Dysrhythmia    Atrial fibrillation   Hypertension    Paroxysmal atrial fibrillation (HCC)    Presence of permanent cardiac pacemaker    Sinus node dysfunction (HCC)    Sleep apnea, obstructive    sleep study 10/09/2007  AHI 5.73/hr  REM AHI 11.90/hr    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   LEFT THUMB    Stroke [facility])    Syncope    2D Echocardiogram 09/27/2010 EF between 40 to 45%   Tachycardia-bradycardia (HCC)    Transient ischemic attack (TIA) 06/07    Past Surgical History:  Procedure Laterality Date   COLONOSCOPY     COLONOSCOPY N/A 06/07/2017   Procedure: COLONOSCOPY;  Surgeon: [name], MD;  Location: [facility];  Service: Endoscopy;  Laterality: N/A;  9:30   COLONOSCOPY N/A 09/20/2018   Procedure: COLONOSCOPY;  Surgeon: [name], MD;  Location: [facility];  Service: Endoscopy;  Laterality: N/A;  1030   ELECTROCARDIOGRAM     INSERT / REPLACE / REMOVE PACEMAKER  05/2011   LOOP RECORDER IMPLANT  02/02/2011   Medtronic- CRM    PACEMAKER INSERTION  05/30/2011   Medtronic Revo   last checked 11/07/2012   POLYPECTOMY  09/20/2018   Procedure: POLYPECTOMY;  Surgeon: [name], MD;  Location: [facility];  Service: Endoscopy;;  colon   PPM GENERATOR CHANGEOUT N/A 12/30/2020   Procedure: PPM GENERATOR CHANGEOUT;  Surgeon: Francyne Headland, MD;  Location: [facility] INVAS[facility];  Service: Cardiovascular;   Laterality: N/A;    Family History  Problem Relation Age of Onset   Hypertension Mother    Heart attack Father 5       deceased from heart attack   Hypertension Brother    Colon cancer Neg Hx    Social History:  reports that he has never smoked. He has never used smokeless tobacco. He reports current alcohol  use. He reports that he does not use drugs.  Allergies:  Allergies  Allergen Reactions   Apoaequorin Other (See Comments)   Prevnar 13 [Pneumococcal 13-Val Conj Vacc]     Rash, caused him to be unconscious     Medications Prior to Admission  Medication Sig Dispense Refill   carvedilol  (COREG ) 25 MG tablet TAKE TWO TABLETS (50MG  TOTAL) BY MOUTH TWO TIMES DAILY WITH A MEAL 120 tablet 6   Cyanocobalamin  (B-12 PO) Take 1 capsule by mouth daily.     diazepam (VALIUM) 2 MG tablet Take 2 mg by mouth every 8 (eight) hours as needed for anxiety.      dofetilide  (TIKOSYN ) 250 MCG capsule Take 1 capsule (250 mcg total) by mouth 2 (two) times daily. 60 capsule 6   enoxaparin  (LOVENOX ) 150 MG/ML injection Inject 1 mL (150 mg total) into the skin daily. 30 mL 0   finasteride  (PROSCAR ) 5 MG tablet Take 5 mg by mouth daily.     hydrochlorothiazide  (MICROZIDE ) 12.5 MG capsule  Take 1 capsule (12.5 mg total) by mouth as needed. 90 capsule 3   irbesartan  (AVAPRO ) 150 MG tablet TAKE ONE TABLET (150MG  TOTAL) BY MOUTH DAILY 90 tablet 2   Misc Natural Products (NEURIVA) CAPS Take 1 capsule by mouth daily. For memory     Multiple Vitamin (MULTIVITAMIN WITH MINERALS) TABS tablet Take 1 tablet by mouth daily. Adult Multivitamin 50+     potassium chloride  (KLOR-CON ) 10 MEQ tablet Take 2 tablets (20 mEq total) by mouth 2 (two) times daily. Take 2 tablets twice daily 120 tablet 3   rosuvastatin  (CRESTOR ) 20 MG tablet TAKE ONE TABLET (20MG  TOTAL) BY MOUTH DAILY 90 tablet 2   Saw Palmetto 450 MG CAPS Take 900 mg by mouth daily.     trospium (SANCTURA) 20 MG tablet Take 20 mg by mouth daily.     furosemide   (LASIX ) 20 MG tablet Take 1 tablet (20 mg total) by mouth daily. 90 tablet 3   Na Sulfate-K Sulfate-Mg Sulfate concentrate 17.5-3.13-1.6 GM/177ML SOLN Use as directed 354 mL 0   Polyethyl Glycol-Propyl Glycol 0.4-0.3 % SOLN Place 1 drop into both eyes 3 (three) times daily as needed (for dry eyes.).     warfarin (COUMADIN ) 5 MG tablet TAKE ONE-HALF TO ONE TABLET BY MOUTH EVERY DAY AS DIRECTED BY COUMADIN  CLINIC 90 tablet 1    No results found for this or any previous visit (from the past 48 hours). No results found.  Review of Systems  All other systems reviewed and are negative.   Blood pressure 124/77, pulse (!) 59, temperature 98.2 F (36.8 C), temperature source Oral, resp. rate 18, height 6' (1.829 m), weight 88.5 kg, SpO2 100%. Physical Exam  GENERAL: The patient is AO x3, in no acute distress. HEENT: Head is normocephalic and atraumatic. EOMI are intact. Mouth is well hydrated and without lesions. NECK: Supple. No masses LUNGS: Clear to auscultation. No presence of rhonchi/wheezing/rales. Adequate chest expansion HEART: RRR, normal s1 and s2. ABDOMEN: Soft, nontender, no guarding, no peritoneal signs, and nondistended. BS +. No masses. EXTREMITIES: Without any cyanosis, clubbing, rash, lesions or edema. NEUROLOGIC: AOx3, no focal motor deficit. SKIN: no jaundice, no rashes  Assessment/Plan 73 year old male with past medical history of hypertension, A-fib, hypertension, sleep apnea, stroke, coming for history of colonic polyps.  Will proceed with colonoscopy.  Toribio Eartha Flavors, MD 11/17/2023, 8:28 AM

## 2023-11-17 NOTE — Op Note (Signed)
 [facility] Patient Name: Alex Maddox Procedure Date: 11/17/2023 8:10 AM MRN: [id number] Date of Birth: 1951/08/16 Attending MD: Toribio Fortune , , [id number] CSN: [id number] Age: 73 Admit Type: Outpatient Procedure:                Colonoscopy Indications:              Surveillance: Personal history of adenomatous                            polyps on last colonoscopy > 5 years ago Providers:                Toribio Fortune, Devere Lodge, Gordy Lonni Balm,                            Technician Referring MD:              Medicines:                Monitored Anesthesia Care Complications:            No immediate complications. Estimated Blood Loss:     Estimated blood loss: none. Procedure:                Pre-Anesthesia Assessment:                           - ASA Grade Assessment: II - A patient with mild                            systemic disease.                           - After reviewing the risks and benefits, the                            patient was deemed in satisfactory condition to                            undergo the procedure.                           - Prior to the procedure, a History and Physical                            was performed, and patient medications, allergies                            and sensitivities were reviewed. The patient's                            tolerance of previous anesthesia was reviewed.                           - The risks and benefits of the procedure and the                            sedation options and risks were discussed with the  patient. All questions were answered and informed                            consent was obtained.                           After obtaining informed consent, the colonoscope                            was passed under direct vision. Throughout the                            procedure, the patient's blood pressure, pulse, and                            oxygen  saturations were monitored continuously. The                            [id number]-HQ190L ([id number]) scope was introduced through                            the anus and advanced to the the cecum, identified                            by appendiceal orifice and ileocecal valve. The                            colonoscopy was performed without difficulty. The                            patient tolerated the procedure well. The quality                            of the bowel preparation was good. Scope In: 8:31:26 AM Scope Out: 8:54:22 AM Scope Withdrawal Time: 0 hours 13 minutes 24 seconds  Total Procedure Duration: 0 hours 22 minutes 56 seconds  Findings:      The perianal and digital rectal examinations were normal.      Six sessile polyps were found in the ascending colon and cecum. The       polyps were 3 to 6 mm in size. These polyps were removed with a cold       snare. Resection and retrieval were complete.      Non-bleeding internal hemorrhoids were found during retroflexion. The       hemorrhoids were small. Impression:               - Six 3 to 6 mm polyps in the ascending colon and                            in the cecum, removed with a cold snare. Resected                            and retrieved.                           -  Non-bleeding internal hemorrhoids. Moderate Sedation:      Per Anesthesia Care Recommendation:           - Discharge patient to home (ambulatory).                           - Resume previous diet.                           - Await pathology results.                           - Repeat colonoscopy for surveillance based on                            pathology results.                           - Restart warfarin tonight. Procedure Code(s):        --- Professional ---                           [phone], Colonoscopy, flexible; with removal of                            tumor(s), polyp(s), or other lesion(s) by snare                            technique Diagnosis  Code(s):        --- Professional ---                           Z86.010, Personal history of colonic polyps                           D12.2, Benign neoplasm of ascending colon                           D12.0, Benign neoplasm of cecum                           K64.8, Other hemorrhoids CPT copyright 2022 [employer]. All rights reserved. The codes documented in this report are preliminary and upon coder review may  be revised to meet current compliance requirements. Toribio Fortune, MD Toribio Fortune,  11/17/2023 9:01:18 AM This report has been signed electronically. Number of Addenda: 0

## 2023-11-17 NOTE — Anesthesia Preprocedure Evaluation (Addendum)
 Anesthesia Evaluation  Patient identified by MRN, date of birth, ID band Patient awake    Reviewed: Allergy & Precautions, H&P , NPO status , Patient's Chart, lab work & pertinent test results, reviewed documented beta blocker date and time   Airway Mallampati: II  TM Distance: >3 FB Neck ROM: full    Dental no notable dental hx. (+) Dental Advisory Given, Teeth Intact   Pulmonary sleep apnea    Pulmonary exam normal breath sounds clear to auscultation       Cardiovascular Exercise Tolerance: Good hypertension, + CAD and +CHF  Normal cardiovascular exam+ dysrhythmias Atrial Fibrillation + pacemaker  Rhythm:regular Rate:Normal  EF 50 - 55 % but was 45% in prior echo.  Diastolic OK   Neuro/Psych Intracranial hemmorhage TIACVA  negative psych ROS   GI/Hepatic negative GI ROS, Neg liver ROS,,,  Endo/Other  negative endocrine ROS    Renal/GU negative Renal ROS  negative genitourinary   Musculoskeletal  (+) Arthritis , Osteoarthritis,    Abdominal   Peds  Hematology negative hematology ROS (+)   Anesthesia Other Findings   Reproductive/Obstetrics negative OB ROS                              Anesthesia Physical Anesthesia Plan  ASA: 3  Anesthesia Plan: General   Post-op Pain Management: Minimal or no pain anticipated   Induction: Intravenous  PONV Risk Score and Plan: Propofol  infusion  Airway Management Planned: Nasal Cannula and Natural Airway  Additional Equipment: None  Intra-op Plan:   Post-operative Plan:   Informed Consent: I have reviewed the patients History and Physical, chart, labs and discussed the procedure including the risks, benefits and alternatives for the proposed anesthesia with the patient or authorized representative who has indicated his/her understanding and acceptance.     Dental Advisory Given  Plan Discussed with: CRNA  Anesthesia Plan Comments:          Anesthesia Quick Evaluation

## 2023-11-17 NOTE — Transfer of Care (Signed)
 Immediate Anesthesia Transfer of Care Note  Patient: Alex Maddox  Procedure(s) Performed: COLONOSCOPY WITH PROPOFOL  POLYPECTOMY  Patient Location: Short Stay  Anesthesia Type:General  Level of Consciousness: drowsy and patient cooperative  Airway & Oxygen Therapy: Patient Spontanous Breathing  Post-op Assessment: Report given to RN and Post -op Vital signs reviewed and stable  Post vital signs: Reviewed and stable  Last Vitals:  Vitals Value Taken Time  BP 99/55 11/17/23 0857  Temp 36.7 C 11/17/23 0857  Pulse 61 11/17/23 0857  Resp 13 11/17/23 0857  SpO2 100 % 11/17/23 0857    Last Pain:  Vitals:   11/17/23 0857  TempSrc: Oral  PainSc: 0-No pain         Complications: No notable events documented.

## 2023-11-17 NOTE — Anesthesia Postprocedure Evaluation (Signed)
 Anesthesia Post Note  Patient: Alex Maddox  Procedure(s) Performed: COLONOSCOPY WITH PROPOFOL  POLYPECTOMY  Patient location during evaluation: PACU Anesthesia Type: General Level of consciousness: awake and alert Pain management: pain level controlled Vital Signs Assessment: post-procedure vital signs reviewed and stable Respiratory status: spontaneous breathing, nonlabored ventilation, respiratory function stable and patient connected to nasal cannula oxygen Cardiovascular status: blood pressure returned to baseline and stable Postop Assessment: no apparent nausea or vomiting Anesthetic complications: no   There were no known notable events for this encounter.   Last Vitals:  Vitals:   11/17/23 0752 11/17/23 0857  BP: 124/77 (!) 99/55  Pulse: (!) 59 61  Resp: 18 13  Temp: 36.8 C 36.7 C  SpO2: 100% 100%    Last Pain:  Vitals:   11/17/23 0857  TempSrc: Oral  PainSc: 0-No pain                 Daved Mcfann L Kaimana Neuzil

## 2023-11-17 NOTE — Discharge Instructions (Addendum)
 You are being discharged to home.  Resume your previous diet.  We are waiting for your pathology results.  Your physician has recommended a repeat colonoscopy for surveillance based on pathology results.

## 2023-11-20 ENCOUNTER — Ambulatory Visit: Payer: Medicare Other | Attending: Nurse Practitioner | Admitting: Emergency Medicine

## 2023-11-20 ENCOUNTER — Encounter (INDEPENDENT_AMBULATORY_CARE_PROVIDER_SITE_OTHER): Payer: Self-pay | Admitting: *Deleted

## 2023-11-20 ENCOUNTER — Encounter: Payer: Self-pay | Admitting: Emergency Medicine

## 2023-11-20 VITALS — BP 100/60 | HR 60 | Ht 72.0 in | Wt 194.0 lb

## 2023-11-20 DIAGNOSIS — G4733 Obstructive sleep apnea (adult) (pediatric): Secondary | ICD-10-CM | POA: Diagnosis not present

## 2023-11-20 DIAGNOSIS — I5042 Chronic combined systolic (congestive) and diastolic (congestive) heart failure: Secondary | ICD-10-CM | POA: Diagnosis not present

## 2023-11-20 DIAGNOSIS — I7143 Infrarenal abdominal aortic aneurysm, without rupture: Secondary | ICD-10-CM | POA: Diagnosis not present

## 2023-11-20 DIAGNOSIS — I495 Sick sinus syndrome: Secondary | ICD-10-CM

## 2023-11-20 DIAGNOSIS — M79672 Pain in left foot: Secondary | ICD-10-CM

## 2023-11-20 DIAGNOSIS — M7989 Other specified soft tissue disorders: Secondary | ICD-10-CM | POA: Diagnosis not present

## 2023-11-20 DIAGNOSIS — I480 Paroxysmal atrial fibrillation: Secondary | ICD-10-CM

## 2023-11-20 LAB — SURGICAL PATHOLOGY

## 2023-11-20 NOTE — Progress Notes (Signed)
 Cardiology Office Note:    Date:  11/20/2023  ID:  Abdulkadir, Mcaulay 1950/12/12, MRN [id number] PCP: Minus Amel, MD  [facility] Providers Cardiologist:  Luana Rumple, MD       Patient Profile:      Alex Maddox is a 73 y.o. male with visit-pertinent history of systolic heart failure, AAA, coronary calcium , hypertension, hyperlipidemia, pacemaker, paroxysmal A-fib, OSA.  He has history of neurally mediated syncope with both cardioinhibitory and vasodepressive components and received a dual-chamber Medtronic MRI conditional permanent pacemaker in August 2012.  He has history of proximal atrial fibrillation and a history of TIA.  He had a normal nuclear stress test in 2012, EF 43%.  He developed congestive heart failure during episode of protracted persistent typical atrial flutter with 2: 1 AV conduction April 2023.  Overdrive pacing was unsuccessful at termination.  Was admitted for dofetilide  loading with conversion to sinus/atrial paced rhythm and resolution of heart failure.  He has history of AAA which has increased slowly from 2.8 in 2018 to 3.0 in 2020, 3.4 in April 2022, 3.7 cm on scan performed in July 2023, probably stable in size since we measured smaller this year at 3.5 cm on 04/21/2023   Seen in clinic on 05/18/2023.  It was noted on average she has 1-2 syncopal events every year despite his pacemaker.  His atrial fibrillation was 9% comparable to his usual historical burden.  Device interrogation has shown atrial fibrillation with RVR episodes that are generally brief.  Last seen in clinic on 10/10/2023 where he presented for a follow-up visit with primary concern of lower extremity edema particularly left ankle and leg.  This had been going on for several months and has progressively worsened.  His hydrochlorothiazide  was stopped and he was started on Lasix  20 mg daily.  Echocardiogram 11/08/2023 showed LVEF 50-55%, no RWMA, normal diastolic parameters, RV SF normal, normal  PASP, trivial AR, mild dilation of ascending aorta measuring 41 mm.  Venous reflux bilateral ultrasound completed 11/08/2023 showing chronic thrombus and small saphenous vein in the right lower extremity.  No evidence of DVT in left lower extremity.  He was advised to stay on Coumadin  and no other changes were made.      History of Present Illness:  Discussed the use of AI scribe software for clinical note transcription with the patient, who gave verbal consent to proceed.  Alex Maddox is a 73 y.o. male who returns for systolic heart failure and lower extremity edema.  Patient arrives in the clinic today with his wife.  He notes the leg swelling and shortness of breath have been ongoing since December. The patient reports that these symptoms have affected his ability to walk long distances, such as during hunting trips, which he used to do without difficulty.  He reports since the addition of Lasix  he has had vast improvement in lower extremity swelling and and some improvement in his shortness of breath.  He has been working on his salt restrictions.  The patient's main concern today is left foot pain and swelling, which has been ongoing for a "a long time" and "more than a year". The pain is sporadic and occurs at both rest and activity. The patient also reports numbness and tingling in the last three toes of the left foot that has also been going on for very long time and he has been tested for neuropathy which was inconclusive. The patient has been wearing compression stockings, which have helped  improve the swelling in the calves but not in the left foot. The patient denies any recent injury to the foot.     Review of Systems  Constitutional: Negative for weight gain and weight loss.  Cardiovascular:  Positive for leg swelling. Negative for chest pain, claudication, dyspnea on exertion, irregular heartbeat, near-syncope, orthopnea, palpitations, paroxysmal nocturnal dyspnea and syncope.   Respiratory:  Positive for shortness of breath. Negative for cough and hemoptysis.   Gastrointestinal:  Negative for abdominal pain, hematochezia and melena.  Genitourinary:  Negative for hematuria.  Neurological:  Negative for dizziness and light-headedness.     See HPI     Home Medications:    Prior to Admission medications   Medication Sig Start Date End Date Taking? Authorizing Provider  carvedilol  (COREG ) 25 MG tablet TAKE TWO TABLETS (50MG  TOTAL) BY MOUTH TWO TIMES DAILY WITH A MEAL 06/01/23   Croitoru, Mihai, MD  Cyanocobalamin  (B-12 PO) Take 1 capsule by mouth daily.    [provider]  diazepam (VALIUM) 2 MG tablet Take 2 mg by mouth every 8 (eight) hours as needed for anxiety.     [provider]  dofetilide  (TIKOSYN ) 250 MCG capsule Take 1 capsule (250 mcg total) by mouth 2 (two) times daily. 04/24/23   Fenton, Clint R, PA  finasteride  (PROSCAR ) 5 MG tablet Take 5 mg by mouth daily.    [provider]  furosemide  (LASIX ) 20 MG tablet Take 1 tablet (20 mg total) by mouth daily. 10/10/23 01/08/24  O'NealCathay Clonts, MD  hydrochlorothiazide  (MICROZIDE ) 12.5 MG capsule Take 1 capsule (12.5 mg total) by mouth as needed. 08/31/23 11/29/23  Millicent Ally, MD  irbesartan  (AVAPRO ) 150 MG tablet TAKE ONE TABLET (150MG  TOTAL) BY MOUTH DAILY 08/14/23   Croitoru, Karyl Paget, MD  Misc Natural Products (NEURIVA) CAPS Take 1 capsule by mouth daily. For memory    [provider]  Multiple Vitamin (MULTIVITAMIN WITH MINERALS) TABS tablet Take 1 tablet by mouth daily. Adult Multivitamin 50+    [provider]  Polyethyl Glycol-Propyl Glycol 0.4-0.3 % SOLN Place 1 drop into both eyes 3 (three) times daily as needed (for dry eyes.).    [provider]  potassium chloride  (KLOR-CON ) 10 MEQ tablet Take 2 tablets (20 mEq total) by mouth 2 (two) times daily. Take 2 tablets twice daily 08/31/23   Millicent Ally, MD  rosuvastatin  (CRESTOR ) 20 MG tablet  TAKE ONE TABLET (20MG  TOTAL) BY MOUTH DAILY 08/08/23   Croitoru, Mihai, MD  Saw Palmetto 450 MG CAPS Take 900 mg by mouth daily.    [provider]  trospium (SANCTURA) 20 MG tablet Take 20 mg by mouth daily.    [provider]  warfarin (COUMADIN ) 5 MG tablet TAKE ONE-HALF TO ONE TABLET BY MOUTH EVERY DAY AS DIRECTED BY COUMADIN  CLINIC 06/21/23   Croitoru, Karyl Paget, MD   Studies Reviewed:   EKG Interpretation Date/Time:  Monday November 20 2023 10:26:25 EST Ventricular Rate:  60 PR Interval:  196 QRS Duration:  130 QT Interval:  466 QTC Calculation: 466 R Axis:   25  Text Interpretation: Atrial-paced rhythm Right bundle branch block When compared with ECG of 31-Aug-2023 11:14, Electronic atrial pacemaker has replaced Electronic ventricular pacemaker Confirmed by Palmer Bobo [phone]) on 11/20/2023 11:15:47 AM    Echocardiogram 11/08/2023 1. Left ventricular ejection fraction, by estimation, is 50 to 55%. The  left ventricle has low normal function. The left ventricle has no regional  wall motion abnormalities. Left ventricular  diastolic parameters were  normal. The average left ventricular   global longitudinal strain is -11.6 %. The global longitudinal strain is  abnormal.   2. Right ventricular systolic function is normal. The right ventricular  size is normal. There is normal pulmonary artery systolic pressure.   3. The mitral valve is degenerative. No evidence of mitral valve  regurgitation. No evidence of mitral stenosis.   4. The aortic valve is tricuspid. Aortic valve regurgitation is trivial.  Aortic valve sclerosis is present, with no evidence of aortic valve  stenosis.   5. Aortic dilatation noted. There is dilatation of the ascending aorta,  measuring 41 mm.   6. The inferior vena cava is normal in size with greater than 50%  respiratory variability, suggesting right atrial pressure of 3 mmHg.   Vascular ultrasound lower extremity 11/08/2023 Right:  -  No evidence of deep vein thrombosis seen in the right lower extremity,  from the common femoral through the popliteal veins.  - Evaluation of the right lower extremity shows there is chronic thrombus  in the small saphenous vein.  - The deep venous system is incompetent at the common femoral and mid  femoral veins.  - The great saphenous vein is incompetent at the distal thigh and knee.  - The small saphenous vein is competent.     Left:  - No evidence of deep vein thrombosis seen in the left lower extremity,  from the common femoral through the popliteal veins.  - No evidence of superficial venous thrombosis in the left lower  extremity.  - The deep venous system is incompetent at the common femoral vein.  - The great saphenous vein is grossly incompetent.  - The small saphenous vein is incompetent at the proximal calf.  Risk Assessment/Calculations:    CHA2DS2-VASc Score = 6   This indicates a 9.7% annual risk of stroke. The patient's score is based upon: CHF History: 1 HTN History: 1 Diabetes History: 0 Stroke History: 2 Vascular Disease History: 1 Age Score: 1 Gender Score: 0            Physical Exam:   VS:  BP 100/60 (BP Location: Right Arm, Patient Position: Sitting, Cuff Size: Normal)   Pulse 60   Ht 6' (1.829 m)   Wt 194 lb (88 kg)   SpO2 99%   BMI 26.31 kg/m    Wt Readings from Last 3 Encounters:  11/20/23 194 lb (88 kg)  11/17/23 195 lb (88.5 kg)  11/14/23 194 lb (88 kg)    Constitutional:      Appearance: Normal and healthy appearance. Not in distress.  Neck:     Vascular: JVD normal.  Pulmonary:     Effort: Pulmonary effort is normal.     Breath sounds: Normal breath sounds.  Chest:     Chest wall: Not tender to palpatation.  Cardiovascular:     PMI at left midclavicular line. Normal rate. Regular rhythm. Normal S1. Normal S2.      Murmurs: There is no murmur.     No gallop.  No click. No rub.  Pulses:    Intact distal pulses.  Edema:     Peripheral edema present.    Thigh: bilateral non-pitting edema of the thigh.    Pretibial: bilateral non-pitting edema of the pretibial area.    Ankle: 1+ edema of the left ankle and non-pitting edema of the right ankle.    Feet: 1+ edema of the left foot and non-pitting edema of the  right foot. Musculoskeletal: Normal range of motion.     Cervical back: Normal range of motion and neck supple. Skin:    General: Skin is warm and dry.  Neurological:     General: No focal deficit present.     Mental Status: Alert, oriented to person, place, and time and oriented to person, place and time.  Psychiatric:        Mood and Affect: Mood and affect normal.        Behavior: Behavior is cooperative.        Thought Content: Thought content normal.        Assessment and Plan:  Systolic heart failure / Lower extremity edema Echocardiogram 11/08/2023 with LVEF 50-55%, no RWMA, normal diastolic parameters showing improved LVEF in comparison to echo 01/2022 with LVEF 45% and global hypokinesis Vascular lower extremity ultrasound 11/08/2023 with right lower extremity chronic thrombus in small saphenous vein and no evidence of thrombosis in lower extremity from common femoral through popliteal veins.  No evidence of DVT in the left. Today he is euvolemic and well compensated.  NYHA class II. Weight is stable and down 6 lbs since 09/2023 Lower extremity edema has vastly improved since addition of Lasix  with no overt signs of fluid overload.  He has no edema in right leg and no edema in left upper leg through the calf with +1 edema in left foot.  He notes shortness of breath/DOE has improved however activity intolerance still mildly reduced.  He is without any angina, orthopnea, PND, syncope -Continue leg elevation, lower extremity stockings, sodium reduction -Continue carvedilol  25 mg daily, Lasix  20 mg daily  Foot pain He notes ongoing left-sided foot pain for several months.  Notes the foot pain is mostly  constant and is not influenced by movement or exertion.  Left medial ankle with 1+ edema. Left foot with no signs of infection, no redness, foot warm to the touch, 2+ dorsalis pedis/posterior tibial pulses, less than 3-second capillary refill time I recommend reevaluation with PCP for ongoing left foot pain  AAA Ultrasound AAA on 04/29/2023 measuring 3.5 cm with previous diameter measurement 3.7 cm on 04/20/2022 -Repeat US  AAA has previously been ordered by Dr. Alvis Ba to be completed in 1 year on 04/2024  Paroxysmal atrial fibrillation / atrial flutter S/p dofetilide  loading 01/2022 Afib burden 22.4% on last device interrogation 09/28/2023 He denies palpations, irregular heart rates, tachycardia   -Continue dofetilide  250 mcg twice daily -Continue Coumadin , managed by Coumadin  clinic -Continue carvedilol  50 mg twice daily  Hypertension Blood pressure today 100/60 and under good control -Continue current antihypertension regimen  [facility] Reports adherence to CPAP  Tachybradycardia syndrome S/p PPM, followed by Dr Alvis Ba            Dispo: Keep scheduled visit on 12/25/2022 with Dr. Alvis Ba  Signed, Ava Boatman, NP

## 2023-11-20 NOTE — Patient Instructions (Signed)
Medication Instructions:  Your physician recommends that you continue on your current medications as directed. Please refer to the Current Medication list given to you today.  *If you need a refill on your cardiac medications before your next appointment, please call your pharmacy*   Lab Work: NONE ordered at this time of appointment    Testing/Procedures: NONE ordered at this time of appointment     Follow-Up: At [facility], you and your health needs are our priority.  As part of our continuing mission to provide you with exceptional heart care, we have created designated Provider Care Teams.  These Care Teams include your primary Cardiologist (physician) and Advanced Practice Providers (APPs -  Physician Assistants and Nurse Practitioners) who all work together to provide you with the care you need, when you need it.  We recommend signing up for the patient portal called "MyChart".  Sign up information is provided on this After Visit Summary.  MyChart is used to connect with patients for Virtual Visits (Telemedicine).  Patients are able to view lab/test results, encounter notes, upcoming appointments, etc.  Non-urgent messages can be sent to your provider as well.   To learn more about what you can do with MyChart, go to [email or web address].    Your next appointment:    Keep follow up   Provider:   Thurmon Fair, MD

## 2023-11-21 ENCOUNTER — Other Ambulatory Visit (HOSPITAL_COMMUNITY): Payer: Self-pay | Admitting: Physician Assistant

## 2023-11-23 ENCOUNTER — Encounter (INDEPENDENT_AMBULATORY_CARE_PROVIDER_SITE_OTHER): Payer: Self-pay | Admitting: *Deleted

## 2023-11-23 ENCOUNTER — Ambulatory Visit: Payer: Medicare Other | Attending: Cardiovascular Disease | Admitting: *Deleted

## 2023-11-23 DIAGNOSIS — Z5181 Encounter for therapeutic drug level monitoring: Secondary | ICD-10-CM

## 2023-11-23 DIAGNOSIS — I4891 Unspecified atrial fibrillation: Secondary | ICD-10-CM

## 2023-11-23 LAB — POCT INR: INR: 1.5 — AB (ref 2.0–3.0)

## 2023-11-23 MED ORDER — ENOXAPARIN SODIUM 150 MG/ML IJ SOSY: 150.0000 mg | PREFILLED_SYRINGE | INTRAMUSCULAR | 0 refills | Status: DC

## 2023-11-23 NOTE — Patient Instructions (Signed)
Take warfarin 1 1/2 tablets tonight then continue 1 tablet daily except for 1 1/2 a tablets on Tuesdays and Fridays Continue Lovenox 150mg  sq at 7am. Pt is stopping the herbal remedy he has been taking for sexual dysfunction that apparently has decreased his INR.

## 2023-11-24 ENCOUNTER — Other Ambulatory Visit (HOSPITAL_COMMUNITY): Payer: Self-pay | Admitting: Family Medicine

## 2023-11-24 ENCOUNTER — Encounter: Payer: Self-pay | Admitting: Cardiovascular Disease

## 2023-11-24 ENCOUNTER — Ambulatory Visit (HOSPITAL_COMMUNITY)
Admission: RE | Admit: 2023-11-24 | Discharge: 2023-11-24 | Disposition: A | Discharge status: Home / Self Care | Payer: Medicare Other | Source: Ambulatory Visit | Attending: Family Medicine | Admitting: Family Medicine

## 2023-11-24 DIAGNOSIS — M7732 Calcaneal spur, left foot: Secondary | ICD-10-CM | POA: Diagnosis not present

## 2023-11-24 DIAGNOSIS — M25472 Effusion, left ankle: Secondary | ICD-10-CM | POA: Diagnosis not present

## 2023-11-24 DIAGNOSIS — R7309 Other abnormal glucose: Secondary | ICD-10-CM | POA: Diagnosis not present

## 2023-11-24 DIAGNOSIS — M7989 Other specified soft tissue disorders: Secondary | ICD-10-CM | POA: Diagnosis not present

## 2023-11-24 DIAGNOSIS — M79672 Pain in left foot: Secondary | ICD-10-CM | POA: Diagnosis not present

## 2023-11-27 ENCOUNTER — Ambulatory Visit: Payer: Medicare Other | Attending: Cardiovascular Disease | Admitting: *Deleted

## 2023-11-27 DIAGNOSIS — Z5181 Encounter for therapeutic drug level monitoring: Secondary | ICD-10-CM

## 2023-11-27 DIAGNOSIS — I4891 Unspecified atrial fibrillation: Secondary | ICD-10-CM | POA: Diagnosis not present

## 2023-11-27 LAB — POCT INR: INR: 1.7 — AB (ref 2.0–3.0)

## 2023-11-27 NOTE — Patient Instructions (Signed)
 Take warfarin 2 tablets tonight then increase to 1 tablet daily except for 1 1/2 a tablets on Tuesdays, Thursdays and Saturdays Out of Lovenox.  Will stop Pt stopped the herbal remedy he has been taking for sexual dysfunction that apparently has decreased his INR.

## 2023-12-04 NOTE — Telephone Encounter (Signed)
 Patient's spouse is calling to f/u regarding not hearing back about this message.  Spoke with LPN Brandi who advised she will callback after clinic today.  Pt's spouse verbalized understanding stating she would just like to know if the prescription can be sent and if not where they go with it next.   Please advise.

## 2023-12-05 ENCOUNTER — Ambulatory Visit: Payer: Medicare Other | Attending: Cardiovascular Disease | Admitting: *Deleted

## 2023-12-05 ENCOUNTER — Telehealth: Payer: Self-pay

## 2023-12-05 DIAGNOSIS — I4891 Unspecified atrial fibrillation: Secondary | ICD-10-CM

## 2023-12-05 DIAGNOSIS — I10 Essential (primary) hypertension: Secondary | ICD-10-CM

## 2023-12-05 DIAGNOSIS — I5022 Chronic systolic (congestive) heart failure: Secondary | ICD-10-CM

## 2023-12-05 DIAGNOSIS — G4733 Obstructive sleep apnea (adult) (pediatric): Secondary | ICD-10-CM

## 2023-12-05 DIAGNOSIS — G4719 Other hypersomnia: Secondary | ICD-10-CM

## 2023-12-05 DIAGNOSIS — I495 Sick sinus syndrome: Secondary | ICD-10-CM

## 2023-12-05 DIAGNOSIS — Z5181 Encounter for therapeutic drug level monitoring: Secondary | ICD-10-CM

## 2023-12-05 DIAGNOSIS — I480 Paroxysmal atrial fibrillation: Secondary | ICD-10-CM

## 2023-12-05 DIAGNOSIS — R0602 Shortness of breath: Secondary | ICD-10-CM

## 2023-12-05 DIAGNOSIS — I5042 Chronic combined systolic (congestive) and diastolic (congestive) heart failure: Secondary | ICD-10-CM

## 2023-12-05 LAB — POCT INR: INR: 2.4 (ref 2.0–3.0)

## 2023-12-05 NOTE — Telephone Encounter (Signed)
 Spoke with patient's wife, notified that order for PAP supplies has been sent to [facility] at given fax number.

## 2023-12-05 NOTE — Patient Instructions (Signed)
 Continue warfarin 1 tablet daily except for 1 1/2 a tablets on Tuesdays, Thursdays and Saturdays Pt stopped the herbal remedy he has been taking for sexual dysfunction that apparently has decreased his INR.   Recheck in 3 wks

## 2023-12-08 DIAGNOSIS — I10 Essential (primary) hypertension: Secondary | ICD-10-CM | POA: Diagnosis not present

## 2023-12-08 DIAGNOSIS — N182 Chronic kidney disease, stage 2 (mild): Secondary | ICD-10-CM | POA: Diagnosis not present

## 2023-12-08 DIAGNOSIS — G629 Polyneuropathy, unspecified: Secondary | ICD-10-CM | POA: Diagnosis not present

## 2023-12-14 ENCOUNTER — Ambulatory Visit (HOSPITAL_COMMUNITY)
Admission: RE | Admit: 2023-12-14 | Discharge: 2023-12-14 | Disposition: A | Discharge status: Home / Self Care | Source: Ambulatory Visit | Attending: Internal Medicine | Admitting: Internal Medicine

## 2023-12-14 ENCOUNTER — Other Ambulatory Visit (HOSPITAL_COMMUNITY): Payer: Self-pay | Admitting: Internal Medicine

## 2023-12-14 DIAGNOSIS — M79605 Pain in left leg: Secondary | ICD-10-CM

## 2023-12-14 DIAGNOSIS — M25472 Effusion, left ankle: Secondary | ICD-10-CM

## 2023-12-14 DIAGNOSIS — I10 Essential (primary) hypertension: Secondary | ICD-10-CM | POA: Diagnosis not present

## 2023-12-14 DIAGNOSIS — M7989 Other specified soft tissue disorders: Secondary | ICD-10-CM | POA: Diagnosis not present

## 2023-12-14 DIAGNOSIS — M1991 Primary osteoarthritis, unspecified site: Secondary | ICD-10-CM | POA: Diagnosis not present

## 2023-12-14 DIAGNOSIS — I5032 Chronic diastolic (congestive) heart failure: Secondary | ICD-10-CM | POA: Diagnosis not present

## 2023-12-14 DIAGNOSIS — I480 Paroxysmal atrial fibrillation: Secondary | ICD-10-CM | POA: Diagnosis not present

## 2023-12-14 DIAGNOSIS — N182 Chronic kidney disease, stage 2 (mild): Secondary | ICD-10-CM | POA: Diagnosis not present

## 2023-12-18 DIAGNOSIS — I480 Paroxysmal atrial fibrillation: Secondary | ICD-10-CM | POA: Diagnosis not present

## 2023-12-18 DIAGNOSIS — N182 Chronic kidney disease, stage 2 (mild): Secondary | ICD-10-CM | POA: Diagnosis not present

## 2023-12-18 DIAGNOSIS — I10 Essential (primary) hypertension: Secondary | ICD-10-CM | POA: Diagnosis not present

## 2023-12-18 DIAGNOSIS — M25472 Effusion, left ankle: Secondary | ICD-10-CM | POA: Diagnosis not present

## 2023-12-18 DIAGNOSIS — M1991 Primary osteoarthritis, unspecified site: Secondary | ICD-10-CM | POA: Diagnosis not present

## 2023-12-18 DIAGNOSIS — G629 Polyneuropathy, unspecified: Secondary | ICD-10-CM | POA: Diagnosis not present

## 2023-12-22 ENCOUNTER — Ambulatory Visit (INDEPENDENT_AMBULATORY_CARE_PROVIDER_SITE_OTHER): Admitting: Podiatry

## 2023-12-22 ENCOUNTER — Ambulatory Visit (INDEPENDENT_AMBULATORY_CARE_PROVIDER_SITE_OTHER)

## 2023-12-22 ENCOUNTER — Encounter: Payer: Self-pay | Admitting: Podiatry

## 2023-12-22 ENCOUNTER — Other Ambulatory Visit: Payer: Self-pay

## 2023-12-22 VITALS — Ht 72.0 in | Wt 194.0 lb

## 2023-12-22 DIAGNOSIS — I480 Paroxysmal atrial fibrillation: Secondary | ICD-10-CM

## 2023-12-22 DIAGNOSIS — M5432 Sciatica, left side: Secondary | ICD-10-CM

## 2023-12-22 DIAGNOSIS — M7742 Metatarsalgia, left foot: Secondary | ICD-10-CM

## 2023-12-22 DIAGNOSIS — I495 Sick sinus syndrome: Secondary | ICD-10-CM

## 2023-12-22 DIAGNOSIS — I10 Essential (primary) hypertension: Secondary | ICD-10-CM

## 2023-12-22 DIAGNOSIS — R0602 Shortness of breath: Secondary | ICD-10-CM

## 2023-12-22 DIAGNOSIS — I872 Venous insufficiency (chronic) (peripheral): Secondary | ICD-10-CM | POA: Diagnosis not present

## 2023-12-22 DIAGNOSIS — I5042 Chronic combined systolic (congestive) and diastolic (congestive) heart failure: Secondary | ICD-10-CM

## 2023-12-22 DIAGNOSIS — G4733 Obstructive sleep apnea (adult) (pediatric): Secondary | ICD-10-CM

## 2023-12-22 DIAGNOSIS — I4891 Unspecified atrial fibrillation: Secondary | ICD-10-CM

## 2023-12-22 DIAGNOSIS — G4719 Other hypersomnia: Secondary | ICD-10-CM

## 2023-12-22 NOTE — Progress Notes (Signed)
 Order with Pressure settings for PAP device sent to [facility] today

## 2023-12-22 NOTE — Progress Notes (Signed)
 Chief Complaint  Patient presents with   Foot Pain    " The pain has been in both feet since December and the swelling has gone down some since being on gabapentin"    HPI: 73 y.o. male presents today after foot and leg pain.  States is mostly on the left side.  He did recently start gabapentin which has been helpful.  He reports history of back pain with arthritis and bulging disks.  Also complaining of leg swelling.  He reports that he had venous ultrasounds in the past which did show distended veins.  Past Medical History:  Diagnosis Date   Abnormal echocardiogram    stress myoview 10/29/2010   Arthritis    " IN MY BACK "   Dyslipidemia    Dysrhythmia    Atrial fibrillation   Hypertension    Paroxysmal atrial fibrillation (HCC)    Presence of permanent cardiac pacemaker    Sinus node dysfunction (HCC)    Sleep apnea, obstructive    sleep study 10/09/2007  AHI 5.73/hr  REM AHI 11.90/hr    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   LEFT THUMB    Stroke [facility])    Syncope    2D Echocardiogram 09/27/2010 EF between 40 to 45%   Tachycardia-bradycardia (HCC)    Transient ischemic attack (TIA) 06/07    Past Surgical History:  Procedure Laterality Date   COLONOSCOPY     COLONOSCOPY N/A 06/07/2017   Procedure: COLONOSCOPY;  Surgeon: Malissa Hippo, MD;  [facility];  Service: Endoscopy;  Laterality: N/A;  9:30   COLONOSCOPY N/A 09/20/2018   Procedure: COLONOSCOPY;  Surgeon: Malissa Hippo, MD;  Location: [facility];  Service: Endoscopy;  Laterality: N/A;  1030   COLONOSCOPY WITH PROPOFOL N/A 11/17/2023   Procedure: COLONOSCOPY WITH PROPOFOL;  Surgeon: Dolores Frame, MD;  Location: [facility];  Service: Gastroenterology;  Laterality: N/A;  9:15AM;ASA 3   ELECTROCARDIOGRAM     INSERT / REPLACE / REMOVE PACEMAKER  05/2011   LOOP RECORDER IMPLANT  02/02/2011   Medtronic- CRM    PACEMAKER INSERTION  05/30/2011   Medtronic Revo   last  checked 11/07/2012   POLYPECTOMY  09/20/2018   Procedure: POLYPECTOMY;  Surgeon: Malissa Hippo, MD;  Location: [facility];  Service: Endoscopy;;  colon   POLYPECTOMY  11/17/2023   Procedure: POLYPECTOMY;  Surgeon: Dolores Frame, MD;  Location: [facility];  Service: Gastroenterology;;   [facility] GENERATOR CHANGEOUT N/A 12/30/2020   Procedure: PPM GENERATOR CHANGEOUT;  Surgeon: Thurmon Fair, MD;  Location: [facility] INVASIVE CV LAB;  Service: Cardiovascular;  Laterality: N/A;    Allergies  Allergen Reactions   Apoaequorin Other (See Comments)   Prevnar 13 [Pneumococcal 13-Val Conj Vacc]     Rash, caused him to be unconscious     ROS    Physical Exam: There were no vitals filed for this visit.  General: The patient is alert and oriented x3 in no acute distress.  Dermatology: Skin is warm, dry and supple bilateral lower extremities. Interspaces are clear of maceration and debris.  Pedal skin atrophic  Vascular: Palpable pedal pulses bilaterally. Capillary refill within normal limits.  +1 pitting edema.  Varicosities noted.  No erythema or calor.  Neurological: Light touch sensation grossly intact bilateral feet.  Protective sensation decreased  Musculoskeletal Exam: No pedal deformities noted.  Some pain to dorsal left forefoot  Radiographic Exam:  Normal  osseous mineralization. Joint spaces preserved.  No fractures or osseous irregularities noted.  Assessment/Plan of Care: 1. Metatarsalgia of left foot   2. Sciatica of left side   3. Venous (peripheral) insufficiency      No orders of the defined types were placed in this encounter.  AMB REFERRAL TO VASCULAR SURGERY/CLINIC  Discussed clinical findings with patient today.  Most of patient's symptoms are related to sciatica and back pain.  They have improved significantly with gabapentin.  Recommend that he see a back specialist if the pain persist.  Regarding the forefoot pain, could be due to altered gait from  sciatica.  Recommend RICE therapy with immobilization.  Metatarsal pad dispensed as could be compensatory. Could be compensatory tendinitis.  Regarding his leg swelling, may benefit from evaluation from pain specialist.  I have instructed patient to to use compression stockings in the meantime  Please follow-up in 3 weeks if symptoms fail to improve or worsen.   Diego Ulbricht L. Marchia Bond, AACFAS [facility]     [address] [place], [place] [address]                Office [phone]  Fax [phone]

## 2023-12-22 NOTE — Patient Instructions (Signed)
 More silicone pads can be purchased from:  [email or web address]

## 2023-12-25 ENCOUNTER — Ambulatory Visit: Payer: Medicare Other | Admitting: Cardiovascular Disease

## 2023-12-27 ENCOUNTER — Ambulatory Visit: Admitting: *Deleted

## 2023-12-27 ENCOUNTER — Encounter (HOSPITAL_COMMUNITY): Payer: Self-pay | Admitting: Physician Assistant

## 2023-12-27 ENCOUNTER — Ambulatory Visit (HOSPITAL_COMMUNITY)
Admission: RE | Admit: 2023-12-27 | Discharge: 2023-12-27 | Disposition: A | Discharge status: Home / Self Care | Payer: Medicare Other | Source: Ambulatory Visit | Attending: Physician Assistant | Admitting: Physician Assistant

## 2023-12-27 VITALS — BP 118/82 | HR 60 | Ht 72.0 in | Wt 194.4 lb

## 2023-12-27 DIAGNOSIS — Z5181 Encounter for therapeutic drug level monitoring: Secondary | ICD-10-CM

## 2023-12-27 DIAGNOSIS — Z8673 Personal history of transient ischemic attack (TIA), and cerebral infarction without residual deficits: Secondary | ICD-10-CM | POA: Diagnosis not present

## 2023-12-27 DIAGNOSIS — I2510 Atherosclerotic heart disease of native coronary artery without angina pectoris: Secondary | ICD-10-CM | POA: Diagnosis not present

## 2023-12-27 DIAGNOSIS — I4891 Unspecified atrial fibrillation: Secondary | ICD-10-CM | POA: Diagnosis not present

## 2023-12-27 DIAGNOSIS — I7121 Aneurysm of the ascending aorta, without rupture: Secondary | ICD-10-CM | POA: Insufficient documentation

## 2023-12-27 DIAGNOSIS — I495 Sick sinus syndrome: Secondary | ICD-10-CM | POA: Diagnosis not present

## 2023-12-27 DIAGNOSIS — G4733 Obstructive sleep apnea (adult) (pediatric): Secondary | ICD-10-CM | POA: Diagnosis not present

## 2023-12-27 DIAGNOSIS — Z950 Presence of cardiac pacemaker: Secondary | ICD-10-CM | POA: Diagnosis not present

## 2023-12-27 DIAGNOSIS — I4510 Unspecified right bundle-branch block: Secondary | ICD-10-CM | POA: Diagnosis not present

## 2023-12-27 DIAGNOSIS — I110 Hypertensive heart disease with heart failure: Secondary | ICD-10-CM | POA: Diagnosis not present

## 2023-12-27 DIAGNOSIS — I5020 Unspecified systolic (congestive) heart failure: Secondary | ICD-10-CM | POA: Insufficient documentation

## 2023-12-27 DIAGNOSIS — D6869 Other thrombophilia: Secondary | ICD-10-CM | POA: Insufficient documentation

## 2023-12-27 DIAGNOSIS — I714 Abdominal aortic aneurysm, without rupture, unspecified: Secondary | ICD-10-CM | POA: Insufficient documentation

## 2023-12-27 DIAGNOSIS — G8929 Other chronic pain: Secondary | ICD-10-CM | POA: Diagnosis not present

## 2023-12-27 DIAGNOSIS — Z7901 Long term (current) use of anticoagulants: Secondary | ICD-10-CM | POA: Diagnosis not present

## 2023-12-27 DIAGNOSIS — Z79899 Other long term (current) drug therapy: Secondary | ICD-10-CM | POA: Diagnosis not present

## 2023-12-27 DIAGNOSIS — I480 Paroxysmal atrial fibrillation: Secondary | ICD-10-CM | POA: Diagnosis not present

## 2023-12-27 DIAGNOSIS — M79673 Pain in unspecified foot: Secondary | ICD-10-CM | POA: Diagnosis not present

## 2023-12-27 LAB — MAGNESIUM: Magnesium: 2.3 mg/dL (ref 1.7–2.4)

## 2023-12-27 LAB — BASIC METABOLIC PANEL
Anion gap: 8 (ref 5–15)
BUN: 18 mg/dL (ref 8–23)
CO2: 28 mmol/L (ref 22–32)
Calcium: 9.9 mg/dL (ref 8.9–10.3)
Chloride: 105 mmol/L (ref 98–111)
Creatinine, Ser: 1.22 mg/dL (ref 0.61–1.24)
GFR, Estimated: >60 mL/min (ref >60)
Glucose, Bld: 94 mg/dL (ref 70–99)
Potassium: 3.6 mmol/L (ref 3.5–5.1)
Sodium: 141 mmol/L (ref 135–145)

## 2023-12-27 LAB — POCT INR: INR: 4.7 — AB (ref 2.0–3.0)

## 2023-12-27 NOTE — Progress Notes (Signed)
 Primary Care Physician: Assunta Found, MD Primary Cardiologist: Dr Royann Shivers Primary Electrophysiologist: none Referring Physician: Dr Odella Aquas Alex Maddox is a 73 y.o. male with a history of OSA, coronary calcifications, CHF, thoracic and abdominal aortic aneurysm, neurally mediated syncope, tachycardia-bradycardia syndrome s/p pacemaker, atrial fibrillation, atrial flutter who presents for follow up in the [facility]. Patient is on warfarin for a CHADS2VASC score of 4. He was seen by Dr Royann Shivers 01/21/22 in rapid atrial flutter. Overdrive pacing with his device converted him to rate controlled afib. Patient called [facility] with heart rates back up to 140s with some SOB and lower extremity edema. He is s/p dofetilide loading 01/2022.  Patient returns for follow up for atrial fibrillation and dofetilide monitoring. He has done well from a cardiac standpoint. His PPM shows 7.6% afib burden. No bleeding issues on anticoagulation. He reports that his chronic foot pain has improved with gabapentin.   Today, he denies symptoms of palpitations, chest pain, shortness of breath, orthopnea, PND, lower extremity edema, dizziness, presyncope, syncope, snoring, daytime somnolence, bleeding, or neurologic sequela. The patient is tolerating medications without difficulties and is otherwise without complaint today.    Atrial Fibrillation Risk Factors:  he does have symptoms or diagnosis of sleep apnea. he does not have a history of rheumatic fever.   Atrial Fibrillation Management history:  Previous antiarrhythmic drugs: dofetilide  Previous cardioversions: none Previous ablations: none Anticoagulation history: warfarin    Past Medical History:  Diagnosis Date   Abnormal echocardiogram    stress myoview 10/29/2010   Arthritis    " IN MY BACK "   Dyslipidemia    Dysrhythmia    Atrial fibrillation   Hypertension    Paroxysmal atrial fibrillation (HCC)     Presence of permanent cardiac pacemaker    Sinus node dysfunction (HCC)    Sleep apnea, obstructive    sleep study 10/09/2007  AHI 5.73/hr  REM AHI 11.90/hr    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   LEFT THUMB    Stroke [facility])    Syncope    2D Echocardiogram 09/27/2010 EF between 40 to 45%   Tachycardia-bradycardia (HCC)    Transient ischemic attack (TIA) 06/07    Current Outpatient Medications  Medication Sig Dispense Refill   carvedilol (COREG) 25 MG tablet TAKE TWO TABLETS (50MG  TOTAL) BY MOUTH TWO TIMES DAILY WITH A MEAL 120 tablet 6   Cyanocobalamin (B-12 PO) Take 1 capsule by mouth daily.     diazepam (VALIUM) 2 MG tablet Take 2 mg by mouth every 8 (eight) hours as needed for anxiety.      dofetilide (TIKOSYN) 250 MCG capsule TAKE ONE (1) CAPSULE BY MOUTH TWICE A DAY. (EVERY 12 HOURS.) 60 capsule 6   finasteride (PROSCAR) 5 MG tablet Take 5 mg by mouth daily.     furosemide (LASIX) 20 MG tablet Take 1 tablet (20 mg total) by mouth daily. 90 tablet 3   gabapentin (NEURONTIN) 100 MG capsule Take 100 mg by mouth 3 (three) times daily.     irbesartan (AVAPRO) 150 MG tablet TAKE ONE TABLET (150MG  TOTAL) BY MOUTH DAILY 90 tablet 2   Misc Natural Products (NEURIVA) CAPS Take 1 capsule by mouth daily. For memory     Multiple Vitamin (MULTIVITAMIN WITH MINERALS) TABS tablet Take 1 tablet by mouth daily. Adult Multivitamin 50+     Polyethyl Glycol-Propyl Glycol 0.4-0.3 % SOLN Place 1 drop into both eyes 3 (three)  times daily as needed (for dry eyes.).     potassium chloride (KLOR-CON) 10 MEQ tablet Take 2 tablets (20 mEq total) by mouth 2 (two) times daily. Take 2 tablets twice daily 120 tablet 3   rosuvastatin (CRESTOR) 20 MG tablet TAKE ONE TABLET (20MG  TOTAL) BY MOUTH DAILY 90 tablet 2   Saw Palmetto 450 MG CAPS Take 900 mg by mouth daily.     trospium (SANCTURA) 20 MG tablet Take 20 mg by mouth daily.     warfarin (COUMADIN) 5 MG tablet TAKE ONE-HALF TO ONE TABLET BY MOUTH EVERY  DAY AS DIRECTED BY COUMADIN CLINIC 90 tablet 1   No current facility-administered medications for this encounter.    ROS- All systems are reviewed and negative except as per the HPI above.  Physical Exam: Vitals:   12/27/23 1038  BP: 118/82  Pulse: 60  Weight: 88.2 kg  Height: 6' (1.829 m)    GEN: Well nourished, well developed in no acute distress CARDIAC: Regular rate and rhythm, no murmurs, rubs, gallops RESPIRATORY:  Clear to auscultation without rales, wheezing or rhonchi  ABDOMEN: Soft, non-tender, non-distended EXTREMITIES:  trace BLE edema, No deformity    Wt Readings from Last 3 Encounters:  12/27/23 88.2 kg  12/22/23 88 kg  11/20/23 88 kg    EKG today demonstrates A paced rhythm Vent. rate 60 BPM PR interval 190 ms QRS duration 132 ms QT/QTcB 466/466 ms   Echo 11/08/23  1. Left ventricular ejection fraction, by estimation, is 50 to 55%. The  left ventricle has low normal function. The left ventricle has no regional  wall motion abnormalities. Left ventricular diastolic parameters were  normal. The average left ventricular global longitudinal strain is -11.6 %. The global longitudinal strain is abnormal.   2. Right ventricular systolic function is normal. The right ventricular  size is normal. There is normal pulmonary artery systolic pressure.   3. The mitral valve is degenerative. No evidence of mitral valve  regurgitation. No evidence of mitral stenosis.   4. The aortic valve is tricuspid. Aortic valve regurgitation is trivial.  Aortic valve sclerosis is present, with no evidence of aortic valve  stenosis.   5. Aortic dilatation noted. There is dilatation of the ascending aorta,  measuring 41 mm.   6. The inferior vena cava is normal in size with greater than 50%  respiratory variability, suggesting right atrial pressure of 3 mmHg.   Comparison(s): A prior study was performed on 01/25/2022. LVEF 45%, global hypokinesis, indeterminate diastolic  dysfunction, average GLS-11.9%, RV mildly reduced and mildly enlarged, mild MR, moderate TR, ascending Ao 40mm, estimated RAP .       Epic records are reviewed at length today   CHA2DS2-VASc Score = 6  The patient's score is based upon: CHF History: 1 HTN History: 1 Diabetes History: 0 Stroke History: 2 Vascular Disease History: 1 Age Score: 1 Gender Score: 0       ASSESSMENT AND PLAN: Paroxysmal Atrial Fibrillation (ICD10:  I48.0) The patient's CHA2DS2-VASc score is 6, indicating a 9.7% annual risk of stroke.   S/p dofetilide loading 01/2022 PPM shows 7.6% afib burden Patient in SR today.  Continue dofetilide 250 mcg BID Continue warfarin Carvedilol 50 mg BID  Secondary Hypercoagulable State (ICD10:  D68.69) The patient is at significant risk for stroke/thromboembolism based upon his CHA2DS2-VASc Score of 6.  Continue Warfarin (Coumadin). No bleeding issues.   High Risk Medication Monitoring (ICD 10: Z79.899) QT interval on ECG acceptable for dofetilide monitoring.  Check bmet/mag today.      Tachy bradycardia syndrome S/p PPM, followed by Dr Royann Shivers   OSA  Encouraged nightly CPAP Followed by Dr Tresa Endo  HFrecEF EF 50-55% GDMT per primary cardiology team Fluid status appears stable today  HTN Stable on current regimen   Follow up in the AF clinic in 6 months.    [name] PA-C Afib Clinic [facility] [address] [place], [place] [address] [phone]

## 2023-12-27 NOTE — Patient Instructions (Signed)
 Hold warfarin tonight then decrease dose to 1 tablet daily. Pt stopped the herbal remedy he has been taking for sexual dysfunction that apparently has decreased his INR.   Recheck in 2 wks

## 2023-12-28 ENCOUNTER — Ambulatory Visit: Payer: Medicare Other

## 2023-12-28 DIAGNOSIS — I495 Sick sinus syndrome: Secondary | ICD-10-CM

## 2023-12-28 LAB — CUP PACEART REMOTE DEVICE CHECK
Battery Remaining Longevity: 128 mo
Battery Voltage: 3.01 V
Brady Statistic AP VP Percent: 23.59 %
Brady Statistic AP VS Percent: 28.75 %
Brady Statistic AS VP Percent: 0.31 %
Brady Statistic AS VS Percent: 47.43 %
Brady Statistic RA Percent Paced: 43.64 %
Brady Statistic RV Percent Paced: 21.74 %
Date Time Interrogation Session: 20250320002716
Implantable Lead Connection Status: 753985
Implantable Lead Connection Status: 753985
Implantable Lead Implant Date: 20120820
Implantable Lead Implant Date: 20120820
Implantable Lead Location: 753859
Implantable Lead Location: 753860
Implantable Pulse Generator Implant Date: 20220323
Lead Channel Impedance Value: 342 Ohm
Lead Channel Impedance Value: 380 Ohm
Lead Channel Impedance Value: 456 Ohm
Lead Channel Impedance Value: 532 Ohm
Lead Channel Pacing Threshold Amplitude: 0.625 V
Lead Channel Pacing Threshold Amplitude: 1.625 V
Lead Channel Pacing Threshold Pulse Width: 0.4 ms
Lead Channel Pacing Threshold Pulse Width: 0.4 ms
Lead Channel Sensing Intrinsic Amplitude: 6.375 mV
Lead Channel Sensing Intrinsic Amplitude: 6.375 mV
Lead Channel Sensing Intrinsic Amplitude: 6.875 mV
Lead Channel Sensing Intrinsic Amplitude: 6.875 mV
Lead Channel Setting Pacing Amplitude: 1.5 V
Lead Channel Setting Pacing Amplitude: 3.25 V
Lead Channel Setting Pacing Pulse Width: 0.4 ms
Lead Channel Setting Sensing Sensitivity: 0.9 mV
Zone Setting Status: 755011

## 2023-12-29 ENCOUNTER — Other Ambulatory Visit: Payer: Self-pay | Admitting: Cardiovascular Disease

## 2023-12-29 ENCOUNTER — Telehealth: Payer: Self-pay | Admitting: Cardiovascular Disease

## 2023-12-29 NOTE — Telephone Encounter (Signed)
 Pt c/o medication issue:  1. Name of Medication:  carvedilol (COREG) 25 MG tablet  2. How are you currently taking this medication (dosage and times per day)?   3. Are you having a reaction (difficulty breathing--STAT)?   4. What is your medication issue?   Please clarify dose is correct.

## 2024-01-01 ENCOUNTER — Encounter: Payer: Self-pay | Admitting: Cardiovascular Disease

## 2024-01-01 MED ORDER — CARVEDILOL 25 MG PO TABS: 50.0000 mg | ORAL_TABLET | Freq: Two times a day (BID) | ORAL | 3 refills | Status: AC

## 2024-01-01 NOTE — Telephone Encounter (Signed)
 Corrected dosage submitted to [employer]. Pt on coreg 50mg  BID.

## 2024-01-09 ENCOUNTER — Ambulatory Visit: Attending: Cardiovascular Disease | Admitting: *Deleted

## 2024-01-09 ENCOUNTER — Telehealth: Payer: Self-pay | Admitting: Cardiovascular Disease

## 2024-01-09 DIAGNOSIS — I4891 Unspecified atrial fibrillation: Secondary | ICD-10-CM | POA: Diagnosis not present

## 2024-01-09 DIAGNOSIS — G4733 Obstructive sleep apnea (adult) (pediatric): Secondary | ICD-10-CM | POA: Diagnosis not present

## 2024-01-09 DIAGNOSIS — Z7901 Long term (current) use of anticoagulants: Secondary | ICD-10-CM | POA: Diagnosis not present

## 2024-01-09 DIAGNOSIS — Z5181 Encounter for therapeutic drug level monitoring: Secondary | ICD-10-CM | POA: Diagnosis not present

## 2024-01-09 LAB — POCT INR: INR: 2.4 (ref 2.0–3.0)

## 2024-01-09 NOTE — Patient Instructions (Signed)
 Continue warfarin 1 tablet daily. Pt stopped the herbal remedy he has been taking for sexual dysfunction that apparently has decreased his INR.   Recheck in 4 wks

## 2024-01-09 NOTE — Telephone Encounter (Signed)
 Per Dr Lamount Cranker office he wants pt to have an updated sleep study

## 2024-01-12 ENCOUNTER — Encounter: Payer: Self-pay | Admitting: Podiatry

## 2024-01-12 ENCOUNTER — Ambulatory Visit: Admitting: Podiatry

## 2024-01-12 ENCOUNTER — Other Ambulatory Visit: Payer: Self-pay | Admitting: Cardiovascular Disease

## 2024-01-12 DIAGNOSIS — I872 Venous insufficiency (chronic) (peripheral): Secondary | ICD-10-CM

## 2024-01-12 DIAGNOSIS — M5432 Sciatica, left side: Secondary | ICD-10-CM | POA: Diagnosis not present

## 2024-01-12 NOTE — Progress Notes (Signed)
 Chief Complaint  Patient presents with   Foot Pain    "The pain is gone but the foot still swells and I don't have a lot of feeling in it."    HPI: 73 y.o. male presents today following up for left forefoot pain.  He reports that starting gabapentin symptoms helped the pain.  He does endorse continued numbness and swelling in the legs.  Foot and leg pain.  He reports history of back pain with arthritis and bulging disks. He reports that he had venous ultrasounds in the past which did show distended veins.  Past Medical History:  Diagnosis Date   Abnormal echocardiogram    stress myoview 10/29/2010   Arthritis    " IN MY BACK "   Dyslipidemia    Dysrhythmia    Atrial fibrillation   Hypertension    Paroxysmal atrial fibrillation (HCC)    Presence of permanent cardiac pacemaker    Sinus node dysfunction (HCC)    Sleep apnea, obstructive    sleep study 10/09/2007  AHI 5.73/hr  REM AHI 11.90/hr    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   LEFT THUMB    Stroke [facility])    Syncope    2D Echocardiogram 09/27/2010 EF between 40 to 45%   Tachycardia-bradycardia (HCC)    Transient ischemic attack (TIA) 06/07    Past Surgical History:  Procedure Laterality Date   COLONOSCOPY     COLONOSCOPY N/A 06/07/2017   Procedure: COLONOSCOPY;  Surgeon: Malissa Hippo, MD;  [facility];  Service: Endoscopy;  Laterality: N/A;  9:30   COLONOSCOPY N/A 09/20/2018   Procedure: COLONOSCOPY;  Surgeon: Malissa Hippo, MD;  [facility];  Service: Endoscopy;  Laterality: N/A;  1030   COLONOSCOPY WITH PROPOFOL N/A 11/17/2023   Procedure: COLONOSCOPY WITH PROPOFOL;  Surgeon: Dolores Frame, MD;  [facility];  Service: Gastroenterology;  Laterality: N/A;  9:15AM;ASA 3   ELECTROCARDIOGRAM     INSERT / REPLACE / REMOVE PACEMAKER  05/2011   LOOP RECORDER IMPLANT  02/02/2011   Medtronic- CRM    PACEMAKER INSERTION  05/30/2011   Medtronic Revo   last  checked 11/07/2012   POLYPECTOMY  09/20/2018   Procedure: POLYPECTOMY;  Surgeon: Malissa Hippo, MD;  [facility];  Service: Endoscopy;;  colon   POLYPECTOMY  11/17/2023   Procedure: POLYPECTOMY;  Surgeon: Dolores Frame, MD;  [facility];  Service: Gastroenterology;;   [facility] GENERATOR CHANGEOUT N/A 12/30/2020   Procedure: PPM GENERATOR CHANGEOUT;  Surgeon: Thurmon Fair, MD;  [facility];  Service: Cardiovascular;  Laterality: N/A;    Allergies  Allergen Reactions   Apoaequorin Other (See Comments)   Prevnar 13 [Pneumococcal 13-Val Conj Vacc]     Rash, caused him to be unconscious     ROS    Physical Exam: There were no vitals filed for this visit.  General: The patient is alert and oriented x3 in no acute distress.  Dermatology: Skin is warm, dry and supple bilateral lower extremities. Interspaces are clear of maceration and debris.  Pedal skin atrophic  Vascular: Palpable pedal pulses bilaterally. Capillary refill within normal limits.  +1 pitting edema.  Varicosities noted.  No erythema or calor.  Neurological: Light touch sensation grossly intact bilateral feet.  Protective sensation decreased  Musculoskeletal Exam: No pedal deformities noted.    Assessment/Plan of Care: 1. Sciatica of left side  2. Venous (peripheral) insufficiency      No orders of the defined types were placed in this encounter.  AMB REFERRAL TO NEUROSURGERY  Discussed clinical findings with patient today.  Reiterated to patient that his back pain and sciatica is the cause of the numbness.  Referral to neurosurgery placed for evaluation of this.  Regarding his leg swelling, we did further discuss findings of venous insufficiency.  Did discuss that compression stockings can be helpful for this condition though often needs to be used with caution and under the direction of cardiologist in setting of history of heart failure. Advised patient to see  his cardiologist.  Follow-up as needed.   Feliz Herard L. Marchia Bond, AACFAS [facility]     [address] [place], [place] [address]                Office [phone]  Fax [phone]

## 2024-01-15 NOTE — Progress Notes (Unsigned)
 Referring Physician:  Barbaraann Share, DPM [address] [place],  [place] [address]  Primary Physician:  Assunta Found, MD  History of Present Illness: 01/17/2024 [name] has a history of afib, HTN, AAA, heart failure, OSA, hyperlipidemia, TIA.   He has a pacemaker.   He was referred by podiatry.   He has 5-6 month history of constant numbness mostly in last 3 toes with intermittent swelling in his left foot. Pain is worse with standing and walking. He has mild LBP with prolonged standing/walking x years. No leg pain. He has seen improvement in his symptoms with neurontin. No weakness.   He is in COUMADIN. He takes neurontin.   He does not smoke.   Bowel/Bladder Dysfunction: he has urinary urgency. No bowel issues. No perineal numbness.   Conservative measures:  Physical therapy: has not participated in PT Multimodal medical therapy including regular antiinflammatories:  gabapentin Injections:  no epidural steroid injections  Past Surgery: no spinal surgeries  Alex Maddox has no symptoms of cervical myelopathy.  The symptoms are causing a significant impact on the patient's life.   Review of Systems:  A 10 point review of systems is negative, except for the pertinent positives and negatives detailed in the HPI.  Past Medical History: Past Medical History:  Diagnosis Date   Abnormal echocardiogram    stress myoview 10/29/2010   Arthritis    " IN MY BACK "   Dyslipidemia    Dysrhythmia    Atrial fibrillation   Hypertension    Paroxysmal atrial fibrillation (HCC)    Presence of permanent cardiac pacemaker    Sinus node dysfunction (HCC)    Sleep apnea, obstructive    sleep study 10/09/2007  AHI 5.73/hr  REM AHI 11.90/hr    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   LEFT THUMB    Stroke [facility])    Syncope    2D Echocardiogram 09/27/2010 EF between 40 to 45%   Tachycardia-bradycardia (HCC)    Transient ischemic attack (TIA) 06/07    Past  Surgical History: Past Surgical History:  Procedure Laterality Date   COLONOSCOPY     COLONOSCOPY N/A 06/07/2017   Procedure: COLONOSCOPY;  Surgeon: Malissa Hippo, MD;  [facility];  Service: Endoscopy;  Laterality: N/A;  9:30   COLONOSCOPY N/A 09/20/2018   Procedure: COLONOSCOPY;  Surgeon: Malissa Hippo, MD;  [facility];  Service: Endoscopy;  Laterality: N/A;  1030   COLONOSCOPY WITH PROPOFOL N/A 11/17/2023   Procedure: COLONOSCOPY WITH PROPOFOL;  Surgeon: Dolores Frame, MD;  [facility];  Service: Gastroenterology;  Laterality: N/A;  9:15AM;ASA 3   ELECTROCARDIOGRAM     INSERT / REPLACE / REMOVE PACEMAKER  05/2011   LOOP RECORDER IMPLANT  02/02/2011   [employer]- CRM    PACEMAKER INSERTION  05/30/2011   Medtronic Revo   last checked 11/07/2012   POLYPECTOMY  09/20/2018   Procedure: POLYPECTOMY;  Surgeon: Malissa Hippo, MD;  [facility];  Service: Endoscopy;;  colon   POLYPECTOMY  11/17/2023   Procedure: POLYPECTOMY;  Surgeon: Dolores Frame, MD;  [facility];  Service: Gastroenterology;;   [facility] GENERATOR CHANGEOUT N/A 12/30/2020   Procedure: PPM GENERATOR CHANGEOUT;  Surgeon: Thurmon Fair, MD;  [facility];  Service: Cardiovascular;  Laterality: N/A;    Allergies: Allergies as of 01/17/2024 - Review Complete 01/17/2024  Allergen Reaction Noted   Apoaequorin  Other (See Comments) 12/20/2022   Other  06/23/2022   Prevnar 13 [pneumococcal 13-val conj vacc]  06/23/2022    Medications: Outpatient Encounter Medications as of 01/17/2024  Medication Sig   carvedilol (COREG) 25 MG tablet Take 2 tablets (50 mg total) by mouth 2 (two) times daily with a meal.   Cyanocobalamin (B-12 PO) Take 1 capsule by mouth daily.   diazepam (VALIUM) 2 MG tablet Take 2 mg by mouth every 8 (eight) hours as needed for anxiety.    dofetilide (TIKOSYN) 250 MCG capsule TAKE ONE (1) CAPSULE BY MOUTH TWICE A  DAY. (EVERY 12 HOURS.)   finasteride (PROSCAR) 5 MG tablet Take 5 mg by mouth daily.   furosemide (LASIX) 20 MG tablet Take 1 tablet (20 mg total) by mouth daily as needed for fluid or edema (20 MG daily as needed for swelling- take potassium when you take this).   gabapentin (NEURONTIN) 100 MG capsule Take 100 mg by mouth 3 (three) times daily.   irbesartan (AVAPRO) 75 MG tablet Take 1 tablet (75 mg total) by mouth daily.   magnesium oxide (MAG-OX) 400 MG tablet Take 1 tablet (400 mg total) by mouth daily.   Misc Natural Products (NEURIVA) CAPS Take 1 capsule by mouth daily. For memory   Multiple Vitamin (MULTIVITAMIN WITH MINERALS) TABS tablet Take 1 tablet by mouth daily. Adult Multivitamin 50+   Polyethyl Glycol-Propyl Glycol 0.4-0.3 % SOLN Place 1 drop into both eyes 3 (three) times daily as needed (for dry eyes.).   potassium chloride (KLOR-CON) 10 MEQ tablet Take 2 tablets (20 mEq total) by mouth daily as needed (take 2 tablets daily when you take Fursosemide).   rosuvastatin (CRESTOR) 20 MG tablet TAKE ONE TABLET (20MG  TOTAL) BY MOUTH DAILY   Saw Palmetto 450 MG CAPS Take 900 mg by mouth daily.   trospium (SANCTURA) 20 MG tablet Take 20 mg by mouth daily.   warfarin (COUMADIN) 5 MG tablet TAKE ONE-HALF TO ONE TABLET BY MOUTH EVERY DAY AS DIRECTED BY COUMADIN CLINIC   [DISCONTINUED] furosemide (LASIX) 20 MG tablet Take 1 tablet (20 mg total) by mouth daily.   [DISCONTINUED] irbesartan (AVAPRO) 150 MG tablet TAKE ONE TABLET (150MG  TOTAL) BY MOUTH DAILY   [DISCONTINUED] potassium chloride (KLOR-CON) 10 MEQ tablet Take 2 tablets (20 mEq total) by mouth 2 (two) times daily. Take 2 tablets twice daily (Patient taking differently: Take 10 mEq by mouth 3 (three) times daily.)   [DISCONTINUED] potassium chloride (KLOR-CON) 10 MEQ tablet Take 2 tablets (20 mEq total) by mouth daily as needed (take 2 tablets daily when you take Fursosemide). Take 2 tablets twice daily   No facility-administered  encounter medications on file as of 01/17/2024.    Social History: Social History   Tobacco Use   Smoking status: Never   Smokeless tobacco: Never   Tobacco comments:    Never smoked 06/28/23  Vaping Use   Vaping status: Never Used  Substance Use Topics   Alcohol use: Not Currently    Comment: occasionally wine   Drug use: No    Family Medical History: Family History  Problem Relation Age of Onset   Hypertension Mother    Heart attack Father 47       deceased from heart attack   Hypertension Brother    Colon cancer Neg Hx     Physical Examination: Vitals:   01/17/24 1306  BP: 124/72    General: Patient is well developed, well nourished, calm, collected, and in no  apparent distress. Attention to examination is appropriate.  Respiratory: Patient is breathing without any difficulty.   NEUROLOGICAL:     Awake, alert, oriented to person, place, and time.  Speech is clear and fluent. Fund of knowledge is appropriate.   Cranial Nerves: Pupils equal round and reactive to light.  Facial tone is symmetric.    No abnormal lesions on exposed skin.   Strength: Side Biceps Triceps Deltoid Interossei Grip Wrist Ext. Wrist Flex.  R 5 5 5 5 5 5 5   L 5 5 5 5 5 5 5    Side Iliopsoas Quads Hamstring PF DF EHL  R 5 5 5 5 5 5   L 5 5 5 5  4- 3   Reflexes are 2+ and symmetric at the biceps, brachioradialis, patella and achilles.   Hoffman's is absent.  Clonus is not present.   Bilateral upper and lower extremity sensation is intact to light touch, but diminished in left foot.   He can toe stand on both feet- has trouble doing independently on one leg. He has difficulty heel standing on left.   No tenderness in left foot.   Gait is slow.   Medical Decision Making  Imaging: Lumbar MRI dated 03/30/21:  FINDINGS: Segmentation: Conventional anatomy assumed, with the last open disc space designated L5-S1.Concordant with previous imaging.   Alignment: Mild convex right  scoliosis and 2 mm of degenerative anterolisthesis at L4-5.   Vertebrae: No worrisome osseous lesion, acute fracture or pars defect. Mild sacroiliac degenerative changes bilaterally.   Conus medullaris: Extends to the L1-2 level and appears normal.   Paraspinal and other soft tissues: No significant paraspinal findings. Grossly stable infrarenal abdominal aortic aneurysm, measuring approximately 3.3 cm on image 8/5.   Disc levels:   T11-12: Disc desiccation with anterior osteophytes. No spinal stenosis or nerve root encroachment.   No significant disc space findings at T12-L1 or L1-2.   L2-3: Mild disc bulging with anterior osteophytes. No spinal stenosis or nerve root encroachment.   L3-4: Disc height and hydration are preserved. Mild disc bulging and facet hypertrophy. No spinal stenosis or nerve root encroachment.   L4-5: Mild loss of disc height with mild disc bulging. Moderate facet and ligamentous hypertrophy, worse on the left and accounting for the grade 1 anterolisthesis. Mild narrowing of the lateral recesses and foramina bilaterally without definite nerve root encroachment.   L5-S1: Mild disc bulging and endplate osteophyte formation asymmetric to the right. Mild bilateral facet hypertrophy. No significant spinal stenosis or nerve root encroachment.   IMPRESSION: 1. Mild lateral recess and foraminal narrowing bilaterally at L4-5 without definite nerve root encroachment. 2. Mild disc bulging at L2-3 and L5-S1. 3. No significant spinal stenosis. 4. Grossly stable 3.3 cm abdominal aortic aneurysm compared with CTA of 2 months ago. Please refer to that report for follow-up recommendations.     Electronically Signed   By: Carey Bullocks M.D.   On: 03/31/2021 15:14  I have personally reviewed the images and agree with the above interpretation.   EMG of bilateral lower extremities dated 11/11/20:  Impression: This is a normal study of the lower extremities.   In particular, there is no evidence of a sensorimotor polyneuropathy or lumbosacral radiculopathy.     ___________________________ Nita Sickle, DO   Assessment and Plan: Mr. Sawin has a  5-6 month history of constant numbness mostly in last 3 toes with intermittent swelling in his left foot. Pain is worse with standing and walking. He has mild LBP with  prolonged standing/walking x years. No leg pain.   MRI from 2022 shows slip at L4-L5 with mild lateral recess and bilateral foraminal stenosis. Also with diffuse lumbar spondylosis.   EMG of lower extremities from 11/11/20 was normal.   He has weakness in left DF and EHL.   Treatment options discussed with patient and following plan made:   - MRI of lumbar spine to further evaluate weakness in left foot. He has pacemaker. States his cardiologist states it is MRI compatible. Does not have card. Will have radiology verify he can have MRI.  - Repeat EMG/NCS of bilateral lower extremities with Dr. Allena Katz at [facility]. Orders done.  - Will get him AFO brace for left foot to help prevent falls. Order to [facility] in [place].  - He would like to follow up with me to review his results once the MRI and EMG are done. Will call him to schedule once I have results.   I spent a total of 35 minutes in face-to-face and non-face-to-face activities related to this patient's care today including review of outside records, review of imaging, review of symptoms, physical exam, discussion of differential diagnosis, discussion of treatment options, and documentation.   Thank you for involving me in the care of this patient.   Drake Leach PA-C Dept. of Neurosurgery

## 2024-01-16 DIAGNOSIS — D631 Anemia in chronic kidney disease: Secondary | ICD-10-CM | POA: Diagnosis not present

## 2024-01-16 DIAGNOSIS — N189 Chronic kidney disease, unspecified: Secondary | ICD-10-CM | POA: Diagnosis not present

## 2024-01-16 DIAGNOSIS — R809 Proteinuria, unspecified: Secondary | ICD-10-CM | POA: Diagnosis not present

## 2024-01-16 DIAGNOSIS — E211 Secondary hyperparathyroidism, not elsewhere classified: Secondary | ICD-10-CM | POA: Diagnosis not present

## 2024-01-17 ENCOUNTER — Ambulatory Visit: Admitting: Orthopedic Surgery

## 2024-01-17 ENCOUNTER — Encounter: Payer: Self-pay | Admitting: Cardiovascular Disease

## 2024-01-17 ENCOUNTER — Ambulatory Visit: Payer: Medicare Other | Attending: Cardiovascular Disease | Admitting: Cardiovascular Disease

## 2024-01-17 ENCOUNTER — Encounter: Payer: Self-pay | Admitting: Orthopedic Surgery

## 2024-01-17 VITALS — BP 124/72 | Ht 72.0 in | Wt 196.0 lb

## 2024-01-17 VITALS — BP 92/70 | HR 60 | Ht 72.0 in | Wt 196.0 lb

## 2024-01-17 DIAGNOSIS — I7141 Pararenal abdominal aortic aneurysm, without rupture: Secondary | ICD-10-CM

## 2024-01-17 DIAGNOSIS — Z950 Presence of cardiac pacemaker: Secondary | ICD-10-CM | POA: Diagnosis not present

## 2024-01-17 DIAGNOSIS — R29898 Other symptoms and signs involving the musculoskeletal system: Secondary | ICD-10-CM

## 2024-01-17 DIAGNOSIS — M545 Low back pain, unspecified: Secondary | ICD-10-CM

## 2024-01-17 DIAGNOSIS — I10 Essential (primary) hypertension: Secondary | ICD-10-CM

## 2024-01-17 DIAGNOSIS — Z5181 Encounter for therapeutic drug level monitoring: Secondary | ICD-10-CM

## 2024-01-17 DIAGNOSIS — I495 Sick sinus syndrome: Secondary | ICD-10-CM

## 2024-01-17 DIAGNOSIS — M5126 Other intervertebral disc displacement, lumbar region: Secondary | ICD-10-CM | POA: Diagnosis not present

## 2024-01-17 DIAGNOSIS — I480 Paroxysmal atrial fibrillation: Secondary | ICD-10-CM

## 2024-01-17 DIAGNOSIS — M47816 Spondylosis without myelopathy or radiculopathy, lumbar region: Secondary | ICD-10-CM

## 2024-01-17 DIAGNOSIS — D6869 Other thrombophilia: Secondary | ICD-10-CM

## 2024-01-17 DIAGNOSIS — R55 Syncope and collapse: Secondary | ICD-10-CM | POA: Diagnosis not present

## 2024-01-17 DIAGNOSIS — I7121 Aneurysm of the ascending aorta, without rupture: Secondary | ICD-10-CM | POA: Diagnosis not present

## 2024-01-17 DIAGNOSIS — I5042 Chronic combined systolic (congestive) and diastolic (congestive) heart failure: Secondary | ICD-10-CM

## 2024-01-17 DIAGNOSIS — E785 Hyperlipidemia, unspecified: Secondary | ICD-10-CM

## 2024-01-17 DIAGNOSIS — I4891 Unspecified atrial fibrillation: Secondary | ICD-10-CM | POA: Diagnosis not present

## 2024-01-17 DIAGNOSIS — G4733 Obstructive sleep apnea (adult) (pediatric): Secondary | ICD-10-CM

## 2024-01-17 DIAGNOSIS — Z79899 Other long term (current) drug therapy: Secondary | ICD-10-CM

## 2024-01-17 DIAGNOSIS — M4316 Spondylolisthesis, lumbar region: Secondary | ICD-10-CM

## 2024-01-17 DIAGNOSIS — M48061 Spinal stenosis, lumbar region without neurogenic claudication: Secondary | ICD-10-CM

## 2024-01-17 DIAGNOSIS — I700 Atherosclerosis of aorta: Secondary | ICD-10-CM | POA: Diagnosis not present

## 2024-01-17 DIAGNOSIS — R200 Anesthesia of skin: Secondary | ICD-10-CM

## 2024-01-17 MED ORDER — POTASSIUM CHLORIDE ER 10 MEQ PO TBCR: 20.0000 meq | EXTENDED_RELEASE_TABLET | Freq: Every day | ORAL | 3 refills | Status: DC | PRN

## 2024-01-17 MED ORDER — FUROSEMIDE 20 MG PO TABS: 20.0000 mg | ORAL_TABLET | Freq: Every day | ORAL | 3 refills | Status: AC | PRN

## 2024-01-17 MED ORDER — MAGNESIUM OXIDE 400 MG PO TABS: 400.0000 mg | ORAL_TABLET | Freq: Every day | ORAL | Status: DC

## 2024-01-17 MED ORDER — IRBESARTAN 75 MG PO TABS: 75.0000 mg | ORAL_TABLET | Freq: Every day | ORAL | 3 refills | Status: AC

## 2024-01-17 NOTE — Patient Instructions (Signed)
 Medication Instructions:  Decrease Irbesartan to 75 mg daily You can take Furosemide 20 mg daily with Potassium 20 meq- Daily as needed for swelling Take Magnesium Oxide 400 mg daily - Over the counter *If you need a refill on your cardiac medications before your next appointment, please call your pharmacy*  Follow-Up: At [facility], you and your health needs are our priority.  As part of our continuing mission to provide you with exceptional heart care, our providers are all part of one team.  This team includes your primary Cardiologist (physician) and Advanced Practice Providers or APPs (Physician Assistants and Nurse Practitioners) who all work together to provide you with the care you need, when you need it.  Your next appointment:   6 month(s)  Provider:   Thurmon Fair, MD     We recommend signing up for the patient portal called "MyChart".  Sign up information is provided on this After Visit Summary.  MyChart is used to connect with patients for Virtual Visits (Telemedicine).  Patients are able to view lab/test results, encounter notes, upcoming appointments, etc.  Non-urgent messages can be sent to your provider as well.   To learn more about what you can do with MyChart, go to [email or web address].        1st Floor: - Lobby - Registration  - Pharmacy  - Lab - Cafe  2nd Floor: - PV Lab - Diagnostic Testing (echo, CT, nuclear med)  3rd Floor: - Vacant  4th Floor: - TCTS (cardiothoracic surgery) - AFib Clinic - Structural Heart Clinic - Vascular Surgery  - Vascular Ultrasound  5th Floor: - HeartCare Cardiology (general and EP) - Clinical Pharmacy for coumadin, hypertension, lipid, weight-loss medications, and med management appointments    Valet parking services will be available as well.

## 2024-01-17 NOTE — Patient Instructions (Signed)
 It was so nice to see you today. Thank you so much for coming in.    You had a lumbar MRI in 2022 that showed some wear and tear. You have some weakness in your left foot and this could be coming from your back.   I want to get an MRI of your lower back to look into things further. We will get this approved through your insurance and [facility] in [place] will call you to schedule the appointment. Ask about your patient responsibility. You do not need to pay this prior to getting MRI, they can bill you.   They will make sure it is safe for you to have an MRI with your pacemaker. If you can find the card they gave you about the pacemaker, bring it with you.   After you have the MRI, it takes 14-21 days for me to get the results back.   I want to get an EMG (nerve conduction test) to look into things further. I have ordered this and [facility] will call you to schedule. You can also call them at [phone]. You will see Dr. Allena Katz again.   Once I have the results of the MRI and the nerve test, we will call you to schedule a follow up with me to review.   I sent an order to the [facility] in [place] for a brace for your left foot (AFO). They are located at [address]. [facility] in [place]. They should call you or you can call them at [phone].   Please do not hesitate to call if you have any questions or concerns. You can also message me in MyChart.   Drake Leach PA-C [phone]     The physicians and staff at [facility] are committed to providing excellent care. You may receive a survey asking for feedback about your experience at our office. We value you your feedback and appreciate you taking the time to to fill it out. The [facility] leadership team is also available to discuss your experience in person, feel free to contact us [phone].

## 2024-01-17 NOTE — Progress Notes (Signed)
 Some confusion about current dose of Potassium. They will send a mychart message after they get home- and will give [name] the dosage that is on the bottle. Will update med list at that time.

## 2024-01-17 NOTE — Progress Notes (Signed)
 Patient ID: DAVED MCFANN, male   DOB: 07/08/1951, 73 y.o.   MRN: [id number]    Cardiology Office Note    Date:  01/22/2024   ID:  ANDRANIK JEUNE, DOB 01/20/1951, MRN [id number]  PCP:  Minus Amel, MD  Cardiologist:   Luana Rumple, MD   Chief Complaint  Patient presents with   Irregular Heart Beat    History of Present Illness:  Alex Maddox Bambi Lever") is a 73 y.o. male with frequent paroxysmal atrial fibrillation, chronic combined systolic and diastolic heart failure, thoracic and abdominal aortic aneurysm, neurally mediated syncope, tachycardia-bradycardia syndrome and pacemaker (Medtronic Azure with 5086 leads, MRI conditional).   A variety of medical problems since his last appointment.  He had a syncope when he became emotional at a family.  He has had problems with foot pain and numbness in his left leg and will be seeing a neurosurgeon at [facility].  Some of his symptoms sound suggestive of lumbar spine disease with referred pain, but he also had some tenderness over the dorsum of his foot that could have been gout.  This has now resolved.  He had broad variations in his anticoagulation that were hard to explain until he confessed to his wife that he had been taking an over-the-counter supplement, INR is now stable after he stopped taking it.  He has not had dizziness or palpitations but his blood pressure today is rather low.  He is not having any problems with orthopnea, PND or lower extremity edema. Denies shortness of breath with activity.  Has not had any chest pain.  He has nocturnal muscle cramps but does not have intermittent claudication.  On the average she has 1 or 2 syncopal events every year despite the pacemaker.    The only time he's developed overt heart failure decompensation was during a protracted episode of atrial flutter with 2: 1 AV block and tachycardia in April 2023.  Attempts at overdrive pacing led to conversion atrial fibrillation.  He was started  on dofetilide with substantial reduction in the burden of arrhythmia, back to his previous baseline of paroxysmal atrial fibrillation, roughly 10% of the time.  As before, he continues to have frequent episodes of rapid ventricular response, despite being on very high dose beta-blocker.  Pacemaker interrogation shows normal device function.  The current generator was implanted in 2022 and has almost 11 years of remaining longevity.  All lead parameters are stable and in normal range.  He has 37% atrial pacing and 20% ventricular pacing.  His burden of atrial fibrillation increased from roughly 10% to 17% in the last few months, especially still during November and December.  Ventricular rate control is borderline acceptable.  His heart rate still occasionally goes over 120 bpm.  Presenting rhythm is atrial paced, ventricular sensed at 60 bpm.  He has managed to lose a little bit of weight and his BMI is now 26.  Continues to have trouble with his CPAP mask and wants to get an inspire device implanted.  Most recent labs show creatinine of 1.22, potassium 3.6 and hemoglobin 15.2.  He has chronically low HDL cholesterol, but his LDL cholesterol is in desirable range on statin therapy.  TSH is normal   He has a history of neurally mediated syncope with both cardioinhibitory and vasodepressor components and received a dual-chamber Medtronic MRI conditional permanent pacemaker in August 2012. He has a history of paroxysmal atrial fibrillation and a history of TIA at age 30. He had  a traumatic right occipital intracerebral hemorrhage while on warfarin anticoagulation in November 2016. He also has thoracic ascending aortic aneurysm, infrarenal AAA, systemic hypertension and hyperlipidemia. Normal nuclear stress test perfusion pattern in 2012, EF 43%; similar EF by echocardiography at that time, improved LVEF 50-55% in Jan 2025 by echo. Only had clinical heart failure during atrial flutter with sustained RVR. Aaron Aas He  has ascending aortic enlargement (2022 - 4.1 cm) and fusiform ectasia of the infrarenal abdominal aorta (2022 - 3.4 cm) and CT evidence of coronary calcification.  He developed congestive heart failure during episode of protracted persistent typical atrial flutter with 2: 1 AV conduction in April 2023.  Overdrive pacing was unsuccessful at termination.  Was admitted for dofetilide loading with conversion to sinus/atrial paced rhythm and resolution of heart failure.  Past Medical History:  Diagnosis Date   Abnormal echocardiogram    stress myoview 10/29/2010   Arthritis    " IN MY BACK "   Dyslipidemia    Dysrhythmia    Atrial fibrillation   Hypertension    Paroxysmal atrial fibrillation (HCC)    Presence of permanent cardiac pacemaker    Sinus node dysfunction (HCC)    Sleep apnea, obstructive    sleep study 10/09/2007  AHI 5.73/hr  REM AHI 11.90/hr    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   LEFT THUMB    Stroke [facility])    Syncope    2D Echocardiogram 09/27/2010 EF between 40 to 45%   Tachycardia-bradycardia (HCC)    Transient ischemic attack (TIA) 06/07    Past Surgical History:  Procedure Laterality Date   COLONOSCOPY     COLONOSCOPY N/A 06/07/2017   Procedure: COLONOSCOPY;  Surgeon: Ruby Corporal, MD;  Location: [facility]O [facility];  Service: Endoscopy;  Laterality: N/A;  9:30   COLONOSCOPY N/A 09/20/2018   Procedure: COLONOSCOPY;  Surgeon: Ruby Corporal, MD;  Location: [facility] ENDO [facility];  Service: Endoscopy;  Laterality: N/A;  1030   COLONOSCOPY WITH PROPOFOL N/A 11/17/2023   Procedure: COLONOSCOPY WITH PROPOFOL;  Surgeon: Urban Garden, MD;  Location: [facility] END[facility];  Service: Gastroenterology;  Laterality: N/A;  9:15AM;ASA 3   ELECTROCARDIOGRAM     INSERT / REPLACE / REMOVE PACEMAKER  05/2011   LOOP RECORDER IMPLANT  02/02/2011   Medtronic- CRM    PACEMAKER INSERTION  05/30/2011   Medtronic Revo   last checked 11/07/2012   POLYPECTOMY  09/20/2018   Procedure:  POLYPECTOMY;  Surgeon: Ruby Corporal, MD;  Location: [facility];  Service: Endoscopy;;  colon   POLYPECTOMY  11/17/2023   Procedure: POLYPECTOMY;  Surgeon: Urban Garden, MD;  Location: [facility];  Service: Gastroenterology;;   [facility] GENERATOR CHANGEOUT N/A 12/30/2020   Procedure: PPM GENERATOR CHANGEOUT;  Surgeon: Luana Rumple, MD;  Location: [facility] INVASIVE CV LAB;  Service: Cardiovascular;  Laterality: N/A;    Outpatient Medications Prior to Visit  Medication Sig Dispense Refill   carvedilol (COREG) 25 MG tablet Take 2 tablets (50 mg total) by mouth 2 (two) times daily with a meal. 360 tablet 3   Cyanocobalamin (B-12 PO) Take 1 capsule by mouth daily.     diazepam (VALIUM) 2 MG tablet Take 2 mg by mouth every 8 (eight) hours as needed for anxiety.      dofetilide (TIKOSYN) 250 MCG capsule TAKE ONE (1) CAPSULE BY MOUTH TWICE A DAY. (EVERY 12 HOURS.) 60 capsule 6   finasteride (PROSCAR) 5 MG tablet Take 5 mg by  mouth daily.     gabapentin (NEURONTIN) 100 MG capsule Take 100 mg by mouth 3 (three) times daily.     Misc Natural Products (NEURIVA) CAPS Take 1 capsule by mouth daily. For memory     Multiple Vitamin (MULTIVITAMIN WITH MINERALS) TABS tablet Take 1 tablet by mouth daily. Adult Multivitamin 50+     Polyethyl Glycol-Propyl Glycol 0.4-0.3 % SOLN Place 1 drop into both eyes 3 (three) times daily as needed (for dry eyes.).     rosuvastatin (CRESTOR) 20 MG tablet TAKE ONE TABLET (20MG  TOTAL) BY MOUTH DAILY 90 tablet 2   Saw Palmetto 450 MG CAPS Take 900 mg by mouth daily.     trospium (SANCTURA) 20 MG tablet Take 20 mg by mouth daily.     warfarin (COUMADIN) 5 MG tablet TAKE ONE-HALF TO ONE TABLET BY MOUTH EVERY DAY AS DIRECTED BY COUMADIN CLINIC 90 tablet 1   furosemide (LASIX) 20 MG tablet Take 1 tablet (20 mg total) by mouth daily. 90 tablet 3   irbesartan (AVAPRO) 150 MG tablet TAKE ONE TABLET (150MG  TOTAL) BY MOUTH DAILY 90 tablet 2   potassium chloride (KLOR-CON) 10  MEQ tablet Take 2 tablets (20 mEq total) by mouth 2 (two) times daily. Take 2 tablets twice daily (Patient taking differently: Take 10 mEq by mouth 3 (three) times daily.) 120 tablet 3   No facility-administered medications prior to visit.     Allergies:   Apoaequorin, Other, and Prevnar 13 [pneumococcal 13-val conj vacc]    Family History:  The patient's family history includes Heart attack (age of onset: 20) in his father; Hypertension in his brother and mother.   PHYSICAL EXAM:   VS:  BP 92/70 (BP Location: Left Arm, Patient Position: Sitting, Cuff Size: Large)   Pulse 60   Ht 6' (1.829 m)   Wt 196 lb (88.9 kg)   SpO2 98%   BMI 26.58 kg/m      General: Alert, oriented x3, no distress, minimally overweight.  Healthy left subclavian pacemaker site. Head: no evidence of trauma, PERRL, EOMI, no exophtalmos or lid lag, no myxedema, no xanthelasma; normal ears, nose and oropharynx Neck: normal jugular venous pulsations and no hepatojugular reflux; brisk carotid pulses without delay and no carotid bruits Chest: clear to auscultation, no signs of consolidation by percussion or palpation, normal fremitus, symmetrical and full respiratory excursions Cardiovascular: normal position and quality of the apical impulse, regular rhythm, normal first and widely split second heart sounds, no murmurs, rubs or gallops Abdomen: no tenderness or distention, no masses by palpation, no abnormal pulsatility or arterial bruits, normal bowel sounds, no hepatosplenomegaly Extremities: no clubbing, cyanosis or edema; 2+ radial, ulnar and brachial pulses bilaterally; 2+ right femoral, posterior tibial and dorsalis pedis pulses; 2+ left femoral, posterior tibial and dorsalis pedis pulses; no subclavian or femoral bruits Neurological: grossly nonfocal Psych: Normal mood and affect   Wt Readings from Last 3 Encounters:  01/17/24 196 lb (88.9 kg)  01/17/24 196 lb (88.9 kg)  12/27/23 194 lb 6.4 oz (88.2 kg)       Studies/Labs Reviewed:   EKG:    EKG Interpretation Date/Time:  Wednesday January 17 2024 08:52:01 EDT Ventricular Rate:  60 PR Interval:  198 QRS Duration:  136 QT Interval:  492 QTC Calculation: 492 R Axis:   35  Text Interpretation: Atrial-paced rhythm Right bundle branch block When compared with ECG of 27-Dec-2023 10:40, No significant change was found Confirmed by Kimetha Trulson ([id number]) on 01/17/2024 9:09:21  AM         CTA 01/27/2021: 1. Stable 4.1 cm ascending thoracic aortic aneurysm (previously 4.2) without complicating features. Recommend annual imaging followup 2. 3.4 cm infrarenal abdominal aortic aneurysm (previously 3.0). Recommend follow-up ultrasound every 3 years. 3. 1.1 cm hypervascular subcapsular lesion in hepatic segment 6, slightly larger than previous studies, nonspecific. Consider elective outpatient liver MR or CT for further characterization.  AAA duplex ultrasound 04/21/2023:  Abdominal Aorta: There is evidence of abnormal dilatation of the mid and  distal Abdominal aorta. The largest aortic measurement is 3.5 cm. The  largest aortic diameter remains essentially unchanged compared to prior  exam. Previous diameter measurement was   3.7 cm obtained on 04/20/22.  IVC/Iliac: There is no evidence of thrombus involving the IVC.   BMET    Component Value Date/Time   NA 141 12/27/2023 1032   NA 142 08/17/2023 1601   K 3.6 12/27/2023 1032   CL 105 12/27/2023 1032   CO2 28 12/27/2023 1032   GLUCOSE 94 12/27/2023 1032   BUN 18 12/27/2023 1032   BUN 19 08/17/2023 1601   CREATININE 1.22 12/27/2023 1032   CREATININE 1.19 01/16/2017 0846   CALCIUM 9.9 12/27/2023 1032   GFRNONAA >60 12/27/2023 1032   GFRAA 75 08/09/2019 0912   Lipid Panel     Component Value Date/Time   CHOL 123 05/04/2021 0801   TRIG 71 05/04/2021 0801   HDL 37 (L) 05/04/2021 0801   CHOLHDL 3.3 05/04/2021 0801   LDLCALC 71 05/04/2021 0801   LABVLDL 15 05/04/2021 0801      ASSESSMENT:    1. Chronic combined systolic and diastolic heart failure (HCC)   2. Paroxysmal atrial fibrillation (HCC)   3. Essential hypertension   4. Tachycardia-bradycardia syndrome (HCC)   5. Acquired thrombophilia (HCC)   6. Encounter for monitoring dofetilide therapy   7. Neurocardiogenic syncope   8. Pacemaker   9. Aneurysm of ascending aorta without rupture (HCC)   10. Pararenal abdominal aortic aneurysm (AAA) without rupture (HCC)   11. Atherosclerosis of aorta (HCC)   12. Dyslipidemia (high LDL; low HDL)   13. OSA (obstructive sleep apnea)   14. Lumbar spine pain       PLAN:  In order of problems listed above:  CHF: His reduced LV systolic function has always been asymptomatic and he appears to be clinically euvolemic.  Will change his diuretic prescription to as needed.  Most recent LVEF improved at 50-55%.  Prefer to avoid diuretics due to his history of neurally mediated syncope.  He only had heart failure during his episode of persistent atrial flutter with rapid ventricular response.  He is on a very high dose of carvedilol to help with ventricular rate control.  Will reduce his dose of irbesartan further to 75 mg daily.  He will not tolerate Entresto.  We have discussed the use of Farxiga or Jardiance, but cost is a big issue and dehydration from these agents could worsen his risk of syncope.  Has nocturnal muscle cramps.  Will try to see if magnesium supplements help. PAFib: On dofetilide and high-dose carvedilol.  His burden of arrhythmia increased during the holiday months but has returned to baseline.  Not clear why this happened, but he was not particularly symptomatic during that time.  He has not had any of the episodes of atrial flutter with persistent high ventricular rates, but still has atrial fibrillation with RVR fairly frequently despite very high dose carvedilol.  Option to switch  to amiodarone in the future if he fails dofetilide.  CHADS Vasc 6(age,  TIA 2, HTN, vascular disease, CHF).   Tachy-brady sd: Pacing occurs relatively infrequently.  The device has made substantial impact on his episodes of vasovagal syncope, although these still occur occasionally.. Warfarin: His INR was highly variable since he was taking an over-the-counter supplement which clearly had some interaction.  Now back to baseline. Dofetilide: Monitor QT interval every 6 months and be aware of drug interactions.  Normal potassium and creatinine in March. Neurally mediated syncope: A variety of different triggers such as increased emotion at a funeral or clipping his toenails. PPM: Normal device function.  Continue remote downloads every 3 months. Asc Ao aneurysm: Time to reevaluate this.  Asymptomatic, last evaluated in 2022 and has been very stable over the last 4 years; plan to perform CT angiography every 3-5 years.  Reevaluate with CT angiography of the entire aorta next year. AAA: Also asymptomatic.  This has increased slowly from 2.8 in 2018 to 3.0 in 2020, 3.4 in April 2022, 3.7 cm in July 2023, 3.5 cm on 04/21/2023 will check his entire aorta by CT angiography. HTN: Using very high dose of beta-blocker for good control of ventricular rates during atrial fibrillation.  Decrease irbesartan to 75 mg daily.  He will not tolerate Entresto (note history of vasovagal syncope). Coronary Ca: He does not have angina or history of a true acute coronary event and remains asymptomatic now.  He had several normal nuclear stress tests in the past (most recently 2012).  The focus is on risk factor management.  On statin.   HLP: Chronically low HDL cholesterol, but LDL within target range on current statin prescription.  He has lost a little weight so hopefully his HDL is showing some improvement.  Avoid sweets and starchy foods. OSA: He has lost additional weight but still has symptoms of obstructive sleep apnea and hates using the CPAP machine.  Will refer to Dr. Tellis Feathers to discuss  inspire device implantation. Lumbar spine disease: Recently saw Lucetta Russel, PA-C in [place].  He has known mild disc disease on MRI from 2022.  He had normal EMG/nerve conduction studies in February 2022.  His symptoms have worsened in the last few months.  There is a plan for repeat MRI and nerve conduction studies.  His device is an MRI conditional pacemaker and he is not pacemaker dependent.  Medication Adjustments/Labs and Tests Ordered: Current medicines are reviewed at length with the patient today.  Concerns regarding medicines are outlined above.  Medication changes, Labs and Tests ordered today are listed in the Patient Instructions below. Patient Instructions  Medication Instructions:  Decrease Irbesartan to 75 mg daily You can take Furosemide 20 mg daily with Potassium 20 meq- Daily as needed for swelling Take Magnesium Oxide 400 mg daily - Over the counter *If you need a refill on your cardiac medications before your next appointment, please call your pharmacy*  Follow-Up: At [facility], you and your health needs are our priority.  As part of our continuing mission to provide you with exceptional heart care, our providers are all part of one team.  This team includes your primary Cardiologist (physician) and Advanced Practice Providers or APPs (Physician Assistants and Nurse Practitioners) who all work together to provide you with the care you need, when you need it.  Your next appointment:   6 month(s)  Provider:   Luana Rumple, MD     We recommend signing up for  the patient portal called "MyChart".  Sign up information is provided on this After Visit Summary.  MyChart is used to connect with patients for Virtual Visits (Telemedicine).  Patients are able to view lab/test results, encounter notes, upcoming appointments, etc.  Non-urgent messages can be sent to your provider as well.   To learn more about what you can do with MyChart, go to [email or web address].         1st Floor: - Lobby - Registration  - Pharmacy  - Lab - Cafe  2nd Floor: - PV Lab - Diagnostic Testing (echo, CT, nuclear med)  3rd Floor: - Vacant  4th Floor: - TCTS (cardiothoracic surgery) - AFib Clinic - Structural Heart Clinic - Vascular Surgery  - Vascular Ultrasound  5th Floor: - HeartCare Cardiology (general and EP) - Clinical Pharmacy for coumadin, hypertension, lipid, weight-loss medications, and med management appointments    Valet parking services will be available as well.        Signed, Luana Rumple, MD  01/22/2024 3:23 PM    [facility] [address] [place], [place], [place]  [address] Phone: [phone]; Fax: [phone]

## 2024-01-19 NOTE — Progress Notes (Signed)
 Order faxed to [facility] in [place].

## 2024-01-23 DIAGNOSIS — R809 Proteinuria, unspecified: Secondary | ICD-10-CM | POA: Diagnosis not present

## 2024-01-23 DIAGNOSIS — N182 Chronic kidney disease, stage 2 (mild): Secondary | ICD-10-CM | POA: Diagnosis not present

## 2024-01-23 DIAGNOSIS — I480 Paroxysmal atrial fibrillation: Secondary | ICD-10-CM | POA: Diagnosis not present

## 2024-01-29 ENCOUNTER — Other Ambulatory Visit: Payer: Self-pay | Admitting: Emergency Medicine

## 2024-01-29 DIAGNOSIS — I7121 Aneurysm of the ascending aorta, without rupture: Secondary | ICD-10-CM

## 2024-01-29 DIAGNOSIS — I714 Abdominal aortic aneurysm, without rupture, unspecified: Secondary | ICD-10-CM

## 2024-01-29 NOTE — Progress Notes (Signed)
 Croitoru, Mihai, MD  Marlon Simpson, RN Can you please schedule him for a CT angiogram of his entire aorta (chest abdomen ) for both ascending aortic aneurysm and abdominal aortic aneurysm?  He had labs performed 12/27/2023.  Otherwise not urgent.  Order placed

## 2024-02-01 ENCOUNTER — Encounter: Payer: Self-pay | Admitting: Radiology

## 2024-02-02 ENCOUNTER — Other Ambulatory Visit: Payer: Self-pay

## 2024-02-02 ENCOUNTER — Encounter: Payer: Self-pay | Admitting: Neurology

## 2024-02-02 DIAGNOSIS — R202 Paresthesia of skin: Secondary | ICD-10-CM

## 2024-02-06 ENCOUNTER — Ambulatory Visit: Attending: Cardiovascular Disease | Admitting: *Deleted

## 2024-02-06 DIAGNOSIS — Z5181 Encounter for therapeutic drug level monitoring: Secondary | ICD-10-CM | POA: Diagnosis not present

## 2024-02-06 DIAGNOSIS — I4891 Unspecified atrial fibrillation: Secondary | ICD-10-CM | POA: Diagnosis not present

## 2024-02-06 LAB — POCT INR: INR: 2.7 (ref 2.0–3.0)

## 2024-02-06 NOTE — Patient Instructions (Signed)
Continue warfarin 1 tablet daily Recheck in 4 wks 

## 2024-02-08 NOTE — Progress Notes (Signed)
 Remote pacemaker transmission.

## 2024-02-13 ENCOUNTER — Other Ambulatory Visit

## 2024-02-19 ENCOUNTER — Ambulatory Visit
Admission: RE | Admit: 2024-02-19 | Discharge: 2024-02-19 | Disposition: A | Discharge status: Home / Self Care | Source: Ambulatory Visit | Attending: Cardiovascular Disease | Admitting: Cardiovascular Disease

## 2024-02-19 DIAGNOSIS — I7143 Infrarenal abdominal aortic aneurysm, without rupture: Secondary | ICD-10-CM | POA: Diagnosis not present

## 2024-02-19 DIAGNOSIS — I714 Abdominal aortic aneurysm, without rupture, unspecified: Secondary | ICD-10-CM

## 2024-02-19 DIAGNOSIS — I7121 Aneurysm of the ascending aorta, without rupture: Secondary | ICD-10-CM | POA: Diagnosis not present

## 2024-02-19 MED ORDER — IOPAMIDOL (ISOVUE-370) INJECTION 76%
80.0000 mL | Freq: Once | INTRAVENOUS | Status: AC | PRN
  Administered 2024-02-19: 80 mL via INTRAVENOUS

## 2024-02-20 ENCOUNTER — Ambulatory Visit: Payer: Self-pay | Admitting: Cardiovascular Disease

## 2024-02-20 DIAGNOSIS — I714 Abdominal aortic aneurysm, without rupture, unspecified: Secondary | ICD-10-CM

## 2024-02-22 ENCOUNTER — Ambulatory Visit (HOSPITAL_COMMUNITY)
Admission: RE | Admit: 2024-02-22 | Discharge: 2024-02-22 | Disposition: A | Discharge status: Home / Self Care | Source: Ambulatory Visit | Attending: Orthopedic Surgery | Admitting: Orthopedic Surgery

## 2024-02-22 DIAGNOSIS — M4316 Spondylolisthesis, lumbar region: Secondary | ICD-10-CM | POA: Diagnosis not present

## 2024-02-22 DIAGNOSIS — R29898 Other symptoms and signs involving the musculoskeletal system: Secondary | ICD-10-CM

## 2024-02-22 DIAGNOSIS — M47816 Spondylosis without myelopathy or radiculopathy, lumbar region: Secondary | ICD-10-CM

## 2024-02-22 DIAGNOSIS — M48061 Spinal stenosis, lumbar region without neurogenic claudication: Secondary | ICD-10-CM | POA: Diagnosis not present

## 2024-02-22 DIAGNOSIS — M51369 Other intervertebral disc degeneration, lumbar region without mention of lumbar back pain or lower extremity pain: Secondary | ICD-10-CM | POA: Diagnosis not present

## 2024-02-22 DIAGNOSIS — R200 Anesthesia of skin: Secondary | ICD-10-CM | POA: Diagnosis not present

## 2024-02-22 DIAGNOSIS — M4807 Spinal stenosis, lumbosacral region: Secondary | ICD-10-CM | POA: Diagnosis not present

## 2024-02-22 DIAGNOSIS — I714 Abdominal aortic aneurysm, without rupture, unspecified: Secondary | ICD-10-CM | POA: Diagnosis not present

## 2024-02-22 NOTE — Progress Notes (Signed)
  Device system confirmed to be MRI conditional, with implant date > 6 weeks ago, and no evidence of abandoned or epicardial leads in review of most recent CXR  Device last cleared by EP Provider: Alena An 02/14/24  (Name and date)  Clearance is good through for 1 year as long as parameters remain stable at time of check. If pt undergoes a cardiac device procedure during that time, they should be re-cleared.   Tachy-therapies to be programmed off if applicable with device back to pre-MRI settings after completion of exam.  Medtronic - Programming recommendation received through Medtronic App/Tablet  Quentin Brunner, RT  02/22/2024 12:30 PM

## 2024-03-05 ENCOUNTER — Ambulatory Visit: Attending: Cardiovascular Disease | Admitting: *Deleted

## 2024-03-05 DIAGNOSIS — Z5181 Encounter for therapeutic drug level monitoring: Secondary | ICD-10-CM | POA: Diagnosis not present

## 2024-03-05 DIAGNOSIS — I4891 Unspecified atrial fibrillation: Secondary | ICD-10-CM | POA: Diagnosis not present

## 2024-03-05 LAB — POCT INR: INR: 2.8 (ref 2.0–3.0)

## 2024-03-05 NOTE — Patient Instructions (Signed)
Continue warfarin 1 tablet daily °Recheck in 6 wks °

## 2024-03-14 ENCOUNTER — Encounter: Payer: Self-pay | Admitting: Neurology

## 2024-03-14 ENCOUNTER — Encounter: Admitting: Neurology

## 2024-03-22 ENCOUNTER — Ambulatory Visit: Admitting: Neurology

## 2024-03-22 DIAGNOSIS — G5732 Lesion of lateral popliteal nerve, left lower limb: Secondary | ICD-10-CM

## 2024-03-22 DIAGNOSIS — R202 Paresthesia of skin: Secondary | ICD-10-CM | POA: Diagnosis not present

## 2024-03-22 NOTE — Procedures (Signed)
 [facility] Neurology  [address] [place], [address]  [place], [place] [address] Tel: [phone] Fax: [phone] Test Date:  03/22/2024  Patient: Alex Maddox DOB: [birth year] Physician: Reyna Cava, DO  Sex: Male Height: 6' 0 Ref Phys: Lucetta Russel, Kirby Peoples  ID#: [id number]   Technician:    History: This is a 73 year old man referred for evaluation of left foot weakness and numbness.  NCV & EMG Findings: Extensive electrodiagnostic testing of the left lower extremity and additional studies of the right shows:  Bilateral sural and peroneal sensory responses are within normal limit superficial peroneal sensory response is asymmetrically reduced as compared to the right. Left peroneal motor response shows reduced amplitude (2.1 mV) and decreased conduction velocity slowing across the fibular head (Poplt-B Fib, 31 m/s) extensor digitorum brevis. Left peroneal motor response at the tibialis anterior shows mildly slowed conduction velocity across the fibular head (Poplit-Fib Head, 37 m/s).  Bilateral tibial motor responses are within normal limits.  Bilateral tibial H reflex studies are within normal limits. Active on chronic motor axonal loss changes are seen affecting the left anterior tibialis and fibularis longus muscles.  Proximal and deep muscles were not tested as the patient is on anticoagulation therapy.  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 There is no evidence of a large fiber sensorimotor polyneuropathy or lumbosacral radiculopathy affecting the lower extremities.   ___________________________ Reyna Cava, DO    Nerve Conduction Studies   Stim Site NR Peak (ms) Norm Peak (ms) O-P Amp (V) Norm O-P Amp  Left Sup Peroneal Anti Sensory (Ant Lat Mall)  32 C  12 cm    2.3 <4.6 5.2 >3  Right Sup Peroneal Anti Sensory (Ant Lat Mall)  32 C  12 cm    2.4 <4.6 9.4 >3  Left Sural Anti Sensory (Lat Mall)  32 C   Calf    3.2 <4.6 4.1 >3  Right Sural Anti Sensory (Lat Mall)  32 C  Calf    3.1 <4.6 5.6 >3     Stim Site NR Onset (ms) Norm Onset (ms) O-P Amp (mV) Norm O-P Amp Site1 Site2 Delta-0 (ms) Dist (cm) Vel (m/s) Norm Vel (m/s)  Left Peroneal Motor (Ext Dig Brev)  32 C  Ankle    2.9 <6.0 *2.1 >2.5 B Fib Ankle 9.0 38.0 42 >40  B Fib    11.9  1.8  Poplt B Fib 2.6 8.0 *31 >40  Poplt    14.5  1.8         Right Peroneal Motor (Ext Dig Brev)  32 C  Ankle    3.3 <6.0 3.4 >2.5 B Fib Ankle 8.9 41.0 46 >40  B Fib    12.2  3.0  Poplt B Fib 1.7 9.0 53 >40  Poplt    13.9  2.9         Left Peroneal TA Motor (Tib Ant)  32 C  Fib Head    2.8 <4.5 4.1 >3 Poplit Fib Head 1.9 7.0 *37 >40  Poplit    4.7 <5.7 3.7         Left Tibial Motor (Abd Hall Brev)  32 C  Ankle    4.0 <6.0 5.3 >4 Knee Ankle 10.7 44.0 41 >40  Knee    14.7  3.8         Right Tibial Motor (Abd Hall Brev)  32 C  Ankle    3.7 <6.0 5.3 >4 Knee Ankle 10.9 44.0  40 >40  Knee    14.6  2.9          Electromyography   Side Muscle Ins.Act Fibs Fasc Recrt Amp Dur Poly Activation Comment  Right AntTibialis Nml Nml Nml Nml Nml Nml Nml Nml N/A  Right Gastroc Nml Nml Nml Nml Nml Nml Nml Nml N/A  Right RectFemoris Nml Nml Nml Nml Nml Nml Nml Nml N/A  Right BicepsFemS Nml Nml Nml Nml Nml Nml Nml Nml N/A  Left AntTibialis Nml *1+ Nml *2- *1+ *1+ *1+ Nml N/A  Left Gastroc Nml Nml Nml Nml Nml Nml Nml Nml N/A  Left RectFemoris Nml Nml Nml Nml Nml Nml Nml Nml N/A  Left BicepsFemS Nml Nml Nml Nml Nml Nml Nml Nml N/A  Left Fibularis Long Nml *1+ Nml *2- *1+ *1+ *1+ Nml N/A      Waveforms:

## 2024-03-22 NOTE — Progress Notes (Unsigned)
 Referring Physician:  Minus Amel, MD [address] [place],  [place] [address]  Primary Physician:  Minus Amel, MD  History of Present Illness: 03/27/2024 [name] has a history of afib, HTN, AAA, heart failure, OSA, hyperlipidemia, TIA.   He has a pacemaker.   Last seen by me on 01/17/24 for constant numbness in left foot with left foot pain and mild LBP.   Previous MRI in 2022 shows slip at L4-L5 with mild lateral recess and bilateral foraminal stenosis. Also with diffuse lumbar spondylosis.    EMG of lower extremities from 11/11/20 was normal.    He had weakness in left DF and EHL. He was sent for AFO brace for left foot.   Here to review updated lumbar MRI and EMG.   He is about the same. He has constant numbness in left foot with weakness. He notes intermittent swelling of left > right foot. He has mild LBP with prolonged standing/walking x years. No leg pain.   He is in COUMADIN . He takes neurontin.   He does not smoke.   Bowel/Bladder Dysfunction: he has urinary urgency. No bowel issues. No perineal numbness.   Conservative measures:  Physical therapy: has not participated in PT Multimodal medical therapy including regular antiinflammatories:  gabapentin Injections:  no epidural steroid injections  Past Surgery: no spinal surgeries  Alex Maddox has no symptoms of cervical myelopathy.  The symptoms are causing a significant impact on the patient's life.   Review of Systems:  A 10 point review of systems is negative, except for the pertinent positives and negatives detailed in the HPI.  Past Medical History: Past Medical History:  Diagnosis Date   Abnormal echocardiogram    stress myoview 10/29/2010   Arthritis     IN MY BACK    Dyslipidemia    Dysrhythmia    Atrial fibrillation   Hypertension    Paroxysmal atrial fibrillation (HCC)    Presence of permanent cardiac pacemaker    Sinus node dysfunction (HCC)    Sleep apnea, obstructive     sleep study 10/09/2007  AHI 5.73/hr  REM AHI 11.90/hr    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   LEFT THUMB    Stroke [facility])    Syncope    2D Echocardiogram 09/27/2010 EF between 40 to 45%   Tachycardia-bradycardia (HCC)    Transient ischemic attack (TIA) 06/07    Past Surgical History: Past Surgical History:  Procedure Laterality Date   COLONOSCOPY     COLONOSCOPY N/A 06/07/2017   Procedure: COLONOSCOPY;  Surgeon: Ruby Corporal, MD;  [facility];  Service: Endoscopy;  Laterality: N/A;  9:30   COLONOSCOPY N/A 09/20/2018   Procedure: COLONOSCOPY;  Surgeon: Ruby Corporal, MD;  [facility];  Service: Endoscopy;  Laterality: N/A;  1030   COLONOSCOPY WITH PROPOFOL  N/A 11/17/2023   Procedure: COLONOSCOPY WITH PROPOFOL ;  Surgeon: Urban Garden, MD;  Location: [facility];  Service: Gastroenterology;  Laterality: N/A;  9:15AM;ASA 3   ELECTROCARDIOGRAM     INSERT / REPLACE / REMOVE PACEMAKER  05/2011   LOOP RECORDER IMPLANT  02/02/2011   Medtronic- CRM    PACEMAKER INSERTION  05/30/2011   Medtronic Revo   last checked 11/07/2012   POLYPECTOMY  09/20/2018   Procedure: POLYPECTOMY;  Surgeon: Ruby Corporal, MD;  [facility]: [facility];  Service: Endoscopy;;  colon   POLYPECTOMY  11/17/2023   Procedure: POLYPECTOMY;  Surgeon: Umberto Ganong,  Bearl Limes, MD;  Location: [facility];  Service: Gastroenterology;;   [facility] GENERATOR CHANGEOUT N/A 12/30/2020   Procedure: PPM GENERATOR CHANGEOUT;  Surgeon: Luana Rumple, MD;  Location: [facility] INVAS[facility] CV LAB;  Service: Cardiovascular;  Laterality: N/A;    Allergies: Allergies as of 03/27/2024 - Review Complete 03/27/2024  Allergen Reaction Noted   Prevnar 13 [pneumococcal 13-val conj vacc]  06/23/2022    Medications: Outpatient Encounter Medications as of 03/27/2024  Medication Sig   carvedilol  (COREG ) 25 MG tablet Take 2 tablets (50 mg total) by mouth 2 (two) times daily with a meal.    Cyanocobalamin  (B-12 PO) Take 1 capsule by mouth daily.   diazepam (VALIUM) 2 MG tablet Take 2 mg by mouth every 8 (eight) hours as needed for anxiety.    dofetilide  (TIKOSYN ) 250 MCG capsule TAKE ONE (1) CAPSULE BY MOUTH TWICE A DAY. (EVERY 12 HOURS.)   finasteride  (PROSCAR ) 5 MG tablet Take 5 mg by mouth daily.   furosemide  (LASIX ) 20 MG tablet Take 1 tablet (20 mg total) by mouth daily as needed for fluid or edema (20 MG daily as needed for swelling- take potassium when you take this).   gabapentin (NEURONTIN) 100 MG capsule Take 100 mg by mouth 3 (three) times daily.   irbesartan  (AVAPRO ) 75 MG tablet Take 1 tablet (75 mg total) by mouth daily.   magnesium  oxide (MAG-OX) 400 MG tablet Take 1 tablet (400 mg total) by mouth daily.   Misc Natural Products (NEURIVA) CAPS Take 1 capsule by mouth daily. For memory   Multiple Vitamin (MULTIVITAMIN WITH MINERALS) TABS tablet Take 1 tablet by mouth daily. Adult Multivitamin 50+   Polyethyl Glycol-Propyl Glycol 0.4-0.3 % SOLN Place 1 drop into both eyes 3 (three) times daily as needed (for dry eyes.).   potassium chloride  (KLOR-CON ) 10 MEQ tablet Take 2 tablets (20 mEq total) by mouth daily as needed (take 2 tablets daily when you take Fursosemide).   rosuvastatin  (CRESTOR ) 20 MG tablet TAKE ONE TABLET (20MG  TOTAL) BY MOUTH DAILY   Saw Palmetto 450 MG CAPS Take 900 mg by mouth daily.   trospium (SANCTURA) 20 MG tablet Take 20 mg by mouth daily.   warfarin (COUMADIN ) 5 MG tablet TAKE ONE-HALF TO ONE TABLET BY MOUTH EVERY DAY AS DIRECTED BY COUMADIN  CLINIC   No facility-administered encounter medications on file as of 03/27/2024.    Social History: Social History   Tobacco Use   Smoking status: Never   Smokeless tobacco: Never   Tobacco comments:    Never smoked 06/28/23  Vaping Use   Vaping status: Never Used  Substance Use Topics   Alcohol  use: Not Currently    Comment: occasionally wine   Drug use: No    Family Medical History: Family  History  Problem Relation Age of Onset   Hypertension Mother    Heart attack Father 50       deceased from heart attack   Hypertension Brother    Colon cancer Neg Hx     Physical Examination: Vitals:   03/27/24 1039  BP: 122/82      Awake, alert, oriented to person, place, and time.  Speech is clear and fluent. Fund of knowledge is appropriate.   Cranial Nerves: Pupils equal round and reactive to light.  Facial tone is symmetric.    No abnormal lesions on exposed skin.   Strength: Side Iliopsoas Quads Hamstring PF DF EHL  R 5 5 5 5 5 5   L 5 5 5  5 4- 3   Reflexes are 2+ and symmetric at the biceps, brachioradialis, patella and achilles.   Hoffman's is absent.  Clonus is not present.   Bilateral upper and lower extremity sensation is intact to light touch, but diminished in left foot.   He can toe stand on both feet- has trouble doing independently on one leg. He has difficulty heel standing on left.   Gait is slow.   Medical Decision Making  Imaging: Lumbar MRI dated 02/22/24:  FINDINGS: Segmentation: Conventional anatomy assumed, with the last open disc space designated L5-S1.Concordant with prior imaging.   Alignment:  Stable minimal degenerative anterolisthesis at L4-5.   Vertebrae: No worrisome osseous lesion, acute fracture or pars defect. Scattered small hemangiomas. Mild sacroiliac degenerative changes bilaterally.   Conus medullaris: Extends to the L2 level. The conus and cauda equina appear normal.   Paraspinal and other soft tissues: No significant paraspinal findings. Known abdominal aortic aneurysm is suboptimally evaluated, largely obscured by a saturation band on the sagittal images. Based on the axial images, this appears similar to recent CTA, measuring up to 3.8 cm on image 24/5.   Disc levels:   Sagittal images demonstrate no significant disc space findings within the visualized lower thoracic spine. There is mild disc bulging and anterior  osteophytes at T10-11 and T11-12 without resulting spinal stenosis or nerve root encroachment.   T12-L1: No significant findings.   L1-2: No significant findings.   L2-3: Mildly increased loss of disc height with annular disc bulging and mild facet hypertrophy. Mild narrowing of the left lateral recess without definite nerve root encroachment. The spinal canal and foramina remain patent.   L3-4: Preserved disc height and hydration. Stable mild disc bulging and mild bilateral facet hypertrophy. No significant spinal stenosis or foraminal narrowing.   L4-5: Stable mild loss of disc height with mild disc bulging and moderate bilateral facet hypertrophy. Similar mild lateral recess and foraminal narrowing bilaterally without definite nerve root encroachment. The spinal canal remains patent.   L5-S1: Stable loss of disc height with annular disc bulging and endplate osteophytes asymmetric to the right. Mild facet and ligamentous hypertrophy. Mildly increased right foraminal narrowing without definite nerve root encroachment. The spinal canal and left foramen remain patent.   IMPRESSION: 1. No acute findings or clear explanation for the patient's symptoms. 2. Mildly increased disc bulging and facet hypertrophy at L2-3 with mild narrowing of the left lateral recess. 3. Stable mild lateral recess and foraminal narrowing bilaterally at L4-5. 4. Mildly increased right foraminal narrowing at L5-S1 without definite nerve root encroachment. 5. Known abdominal aortic aneurysm appears similar to recent CTA.     Electronically Signed   By: Elmon Hagedorn M.D.   On: 03/25/2024 11:36  I have personally reviewed the images and agree with the above interpretation.    EMG of bilateral lower extremities dated 04/21/24:    Assessment and Plan: Alex Maddox has constant numbness in left foot with weakness. He notes intermittent swelling of left > right foot. He has mild LBP with prolonged  standing/walking x years. No leg pain.   EMG of lower extremities from 11/11/20 was normal. Updated EMG shows eft common peroneal mononeuropathy across the fibular head, predominantly demyelinating, mild.   He has known lumbar spondylosis with slip L4-L5 with multilevel foraminal stenosis. Don't see anything that would correlate with left foot weakness.   Treatment options discussed with patient and following plan made:   - Recommend he see Dr. Felipe Horton to discuss further  treatment options for left peroneal neuropathy.  - Orders sent to [facility] in [place] for AFO left foot. Did not get after last visit.   I spent a total of 25 minutes in face-to-face and non-face-to-face activities related to this patient's care today including review of outside records, review of imaging, review of symptoms, physical exam, discussion of differential diagnosis, discussion of treatment options, and documentation.   Lucetta Russel PA-C Dept. of Neurosurgery

## 2024-03-25 ENCOUNTER — Encounter: Payer: Self-pay | Admitting: Surgery

## 2024-03-25 ENCOUNTER — Ambulatory Visit: Attending: Surgery | Admitting: Surgery

## 2024-03-25 VITALS — BP 121/77 | HR 65 | Temp 98.0°F | Ht 72.0 in | Wt 193.0 lb

## 2024-03-25 DIAGNOSIS — I7143 Infrarenal abdominal aortic aneurysm, without rupture: Secondary | ICD-10-CM | POA: Diagnosis not present

## 2024-03-25 NOTE — Progress Notes (Signed)
 [facility] [place]  Patient name: Alex Maddox MRN: [id number] DOB: Feb 21, 1951 Sex: male   REQUESTING PROVIDER:    Dr Alvis Ba   REASON FOR CONSULT:    AAA  HISTORY OF PRESENT ILLNESS:   Alex Maddox is a 73 y.o. male, who is referred for evaluation of an abdominal aortic aneurysm.  He recently had a CT scan that shows a 4 cm intrarenal abdominal aortic aneurysm.  Patient suffers from paroxysmal atrial fibrillation and is on Coumadin .  He has a pacemaker placed for sinus node dysfunction.  He takes a statin for hypercholesterolemia.  He is managed medically for hypertension.  He remains active and walks frequently and does not have issues with leg pain.  He does not have any abdominal pain.  PAST MEDICAL HISTORY    Past Medical History:  Diagnosis Date   Abnormal echocardiogram    stress myoview 10/29/2010   Arthritis     IN MY BACK    Dyslipidemia    Dysrhythmia    Atrial fibrillation   Hypertension    Paroxysmal atrial fibrillation (HCC)    Presence of permanent cardiac pacemaker    Sinus node dysfunction (HCC)    Sleep apnea, obstructive    sleep study 10/09/2007  AHI 5.73/hr  REM AHI 11.90/hr    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   LEFT THUMB    Stroke (HCC)    Syncope    2D Echocardiogram 09/27/2010 EF between 40 to 45%   Tachycardia-bradycardia (HCC)    Transient ischemic attack (TIA) 06/07     FAMILY HISTORY   Family History  Problem Relation Age of Onset   Hypertension Mother    Heart attack Father 23       deceased from heart attack   Hypertension Brother    Colon cancer Neg Hx     SOCIAL HISTORY:   Social History   Socioeconomic History   Marital status: Married    Spouse name: Not on file   Number of children: 0   Years of education: Not on file   Highest education level: Not on file  Occupational History   Not on file  Tobacco Use   Smoking status: Never    Smokeless tobacco: Never   Tobacco comments:    Never smoked 06/28/23  Vaping Use   Vaping status: Never Used  Substance and Sexual Activity   Alcohol  use: Not Currently    Comment: occasionally wine   Drug use: No   Sexual activity: Not on file  Other Topics Concern   Not on file  Social History Narrative   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    Lives in a two story home   Social Drivers of Health   Financial Resource Strain: Not on file  Food Insecurity: Not on file  Transportation Needs: Not on file  Physical Activity: Not on file  Stress: Not on file  Social Connections: Not on file  Intimate Partner Violence: Not on file    ALLERGIES:    Allergies  Allergen Reactions   Apoaequorin Other (See Comments)   Other     Rash, caused him to be unconscious   Prevnar 13 [Pneumococcal 13-Val Conj Vacc]     Rash, caused him to be unconscious     CURRENT MEDICATIONS:    Current Outpatient Medications  Medication Sig Dispense Refill   carvedilol  (COREG ) 25 MG tablet Take 2 tablets (  50 mg total) by mouth 2 (two) times daily with a meal. 360 tablet 3   Cyanocobalamin  (B-12 PO) Take 1 capsule by mouth daily.     diazepam (VALIUM) 2 MG tablet Take 2 mg by mouth every 8 (eight) hours as needed for anxiety.      dofetilide  (TIKOSYN ) 250 MCG capsule TAKE ONE (1) CAPSULE BY MOUTH TWICE A DAY. (EVERY 12 HOURS.) 60 capsule 6   finasteride  (PROSCAR ) 5 MG tablet Take 5 mg by mouth daily.     furosemide  (LASIX ) 20 MG tablet Take 1 tablet (20 mg total) by mouth daily as needed for fluid or edema (20 MG daily as needed for swelling- take potassium when you take this). 90 tablet 3   gabapentin (NEURONTIN) 100 MG capsule Take 100 mg by mouth 3 (three) times daily.     irbesartan  (AVAPRO ) 75 MG tablet Take 1 tablet (75 mg total) by mouth daily. 90 tablet 3   magnesium  oxide (MAG-OX) 400 MG tablet Take 1 tablet (400 mg total) by mouth daily.     Misc Natural  Products (NEURIVA) CAPS Take 1 capsule by mouth daily. For memory     Multiple Vitamin (MULTIVITAMIN WITH MINERALS) TABS tablet Take 1 tablet by mouth daily. Adult Multivitamin 50+     Polyethyl Glycol-Propyl Glycol 0.4-0.3 % SOLN Place 1 drop into both eyes 3 (three) times daily as needed (for dry eyes.).     potassium chloride  (KLOR-CON ) 10 MEQ tablet Take 2 tablets (20 mEq total) by mouth daily as needed (take 2 tablets daily when you take Fursosemide). 180 tablet 3   rosuvastatin  (CRESTOR ) 20 MG tablet TAKE ONE TABLET (20MG  TOTAL) BY MOUTH DAILY 90 tablet 2   Saw Palmetto 450 MG CAPS Take 900 mg by mouth daily.     trospium (SANCTURA) 20 MG tablet Take 20 mg by mouth daily.     warfarin (COUMADIN ) 5 MG tablet TAKE ONE-HALF TO ONE TABLET BY MOUTH EVERY DAY AS DIRECTED BY COUMADIN  CLINIC 90 tablet 1   No current facility-administered medications for this visit.    REVIEW OF SYSTEMS:   [X]  denotes positive finding, [ ]  denotes negative finding Cardiac  Comments:  Chest pain or chest pressure:    Shortness of breath upon exertion:    Short of breath when lying flat:    Irregular heart rhythm:        Vascular    Pain in calf, thigh, or hip brought on by ambulation:    Pain in feet at night that wakes you up from your sleep:     Blood clot in your veins:    Leg swelling:  x       Pulmonary    Oxygen at home:    Productive cough:     Wheezing:         Neurologic    Sudden weakness in arms or legs:     Sudden numbness in arms or legs:     Sudden onset of difficulty speaking or slurred speech:    Temporary loss of vision in one eye:     Problems with dizziness:         Gastrointestinal    Blood in stool:      Vomited blood:         Genitourinary    Burning when urinating:     Blood in urine:        Psychiatric    Major depression:  Hematologic    Bleeding problems:    Problems with blood clotting too easily:        Skin    Rashes or ulcers:         Constitutional    Fever or chills:     PHYSICAL EXAM:   Vitals:   03/25/24 0956  BP: 121/77  Pulse: 65  Temp: 98 F (36.7 C)  SpO2: 96%  Weight: 193 lb (87.5 kg)  Height: 6' (1.829 m)    GENERAL: The patient is a well-nourished male, in no acute distress. The vital signs are documented above. CARDIAC: There is a regular rate and rhythm.  VASCULAR: Patient did not want to take his compression socks off this morning.  No carotid bruits PULMONARY: Nonlabored respirations ABDOMEN: Soft and non-tender  MUSCULOSKELETAL: There are no major deformities or cyanosis. NEUROLOGIC: No focal weakness or paresthesias are detected. SKIN: There are no ulcers or rashes noted. PSYCHIATRIC: The patient has a normal affect.  STUDIES:   I have reviewed the following CTA: Interval increase in size of an L4 0.0 cm infrarenal abdominal aortic aneurysm. Recommend follow-up in 12 months and referral to a vascular specialist.   There is now a 8 mm penetrating ulcer of the infrarenal aorta below the abdominal aortic aneurysm sac. Again follow up with a vascular specialist is recommended.   Stable dilatation of the ascending thoracic aorta measuring 4.2 cm.    ASSESSMENT and PLAN   AAA: Maximal aortic diameter by CT scan is 4 cm.  I discussed with the patient and his wife that I would recommend repair once he becomes greater than 5 cm.  I will follow him with surveillance ultrasound.  He will need a repeat CT scan once his diameter is greater than 4.5 cm.  He will return in 1 year.   Marti Slates, MD, FACS [facility] [phone] Pager [phone]

## 2024-03-27 ENCOUNTER — Ambulatory Visit: Admitting: Orthopedic Surgery

## 2024-03-27 ENCOUNTER — Encounter: Payer: Self-pay | Admitting: Orthopedic Surgery

## 2024-03-27 VITALS — BP 122/82 | Ht 72.0 in | Wt 195.0 lb

## 2024-03-27 DIAGNOSIS — M47896 Other spondylosis, lumbar region: Secondary | ICD-10-CM

## 2024-03-27 DIAGNOSIS — M5126 Other intervertebral disc displacement, lumbar region: Secondary | ICD-10-CM

## 2024-03-27 DIAGNOSIS — G5732 Lesion of lateral popliteal nerve, left lower limb: Secondary | ICD-10-CM

## 2024-03-27 DIAGNOSIS — R29898 Other symptoms and signs involving the musculoskeletal system: Secondary | ICD-10-CM | POA: Diagnosis not present

## 2024-03-27 NOTE — Patient Instructions (Addendum)
 It was so nice to see you today. Thank you so much for coming in.    Your nerve test showed compression of a nerve in your leg. I think this is causing your numbness and weakness.  You have a follow up with Dr. Felipe Horton next week. Let me know if we need to change this.   I sent another order to the [facility] in [place] for a brace for your left foot (AFO). They are located at [address]. [facility] in [place]. They should call you or you can call them at [phone].   Please do not hesitate to call if you have any questions or concerns. You can also message me in MyChart.   Alex Russel PA-C [phone]     The physicians and staff at [facility] at [facility] are committed to providing excellent care. You may receive a survey asking for feedback about your experience at our office. We value you your feedback and appreciate you taking the time to to fill it out. The [facility] leadership team is also available to discuss your experience in person, feel free to contact us  [phone].

## 2024-03-28 ENCOUNTER — Ambulatory Visit (INDEPENDENT_AMBULATORY_CARE_PROVIDER_SITE_OTHER): Payer: Medicare Other

## 2024-03-28 DIAGNOSIS — I495 Sick sinus syndrome: Secondary | ICD-10-CM

## 2024-03-28 LAB — CUP PACEART REMOTE DEVICE CHECK
Battery Remaining Longevity: 129 mo
Battery Voltage: 3.01 V
Brady Statistic AP VP Percent: 21.81 %
Brady Statistic AP VS Percent: 31.08 %
Brady Statistic AS VP Percent: 0.34 %
Brady Statistic AS VS Percent: 46.83 %
Brady Statistic RA Percent Paced: 43.12 %
Brady Statistic RV Percent Paced: 20.68 %
Date Time Interrogation Session: 20250619011419
Implantable Lead Connection Status: 753985
Implantable Lead Connection Status: 753985
Implantable Lead Implant Date: 20120820
Implantable Lead Implant Date: 20120820
Implantable Lead Location: 753859
Implantable Lead Location: 753860
Implantable Pulse Generator Implant Date: 20220323
Lead Channel Impedance Value: 342 Ohm
Lead Channel Impedance Value: 380 Ohm
Lead Channel Impedance Value: 513 Ohm
Lead Channel Impedance Value: 608 Ohm
Lead Channel Pacing Threshold Amplitude: 0.625 V
Lead Channel Pacing Threshold Amplitude: 1.25 V
Lead Channel Pacing Threshold Pulse Width: 0.4 ms
Lead Channel Pacing Threshold Pulse Width: 0.4 ms
Lead Channel Sensing Intrinsic Amplitude: 6 mV
Lead Channel Sensing Intrinsic Amplitude: 6 mV
Lead Channel Sensing Intrinsic Amplitude: 6.625 mV
Lead Channel Sensing Intrinsic Amplitude: 6.625 mV
Lead Channel Setting Pacing Amplitude: 1.5 V
Lead Channel Setting Pacing Amplitude: 2.75 V
Lead Channel Setting Pacing Pulse Width: 0.4 ms
Lead Channel Setting Sensing Sensitivity: 0.9 mV
Zone Setting Status: 755011

## 2024-04-01 ENCOUNTER — Other Ambulatory Visit: Payer: Self-pay

## 2024-04-01 ENCOUNTER — Ambulatory Visit: Admitting: Neurosurgery

## 2024-04-01 ENCOUNTER — Encounter: Admitting: Surgery

## 2024-04-01 ENCOUNTER — Encounter: Payer: Self-pay | Admitting: Neurosurgery

## 2024-04-01 VITALS — BP 118/78 | Ht 72.0 in | Wt 195.0 lb

## 2024-04-01 DIAGNOSIS — M21372 Foot drop, left foot: Secondary | ICD-10-CM

## 2024-04-01 DIAGNOSIS — G5732 Lesion of lateral popliteal nerve, left lower limb: Secondary | ICD-10-CM | POA: Diagnosis not present

## 2024-04-01 DIAGNOSIS — Z01818 Encounter for other preprocedural examination: Secondary | ICD-10-CM

## 2024-04-01 NOTE — Patient Instructions (Addendum)
 Please see below for information in regards to your upcoming surgery:   Planned surgery: Left Peroneal Nerve Decompression    Surgery date:  CALL OFFICE WITH DATE ( 05/14/2024, 05/16/2024, 05/21/2024, 05/23/2024, 06/04/2024, 06/06/2024)  at [facility] (Medical Mall: [address] [place], [place] [address]) - you will find out your arrival time the business day before your surgery.   Pre-op appointment at [facility] Pre-admit Testing: you will receive a call with a date/time for this appointment. If you are scheduled for an in person appointment, Pre-admit Testing is located on the first floor of the [facility] building, [address] [facility] [facility], [address]. During this appointment, they will advise you which medications you can take the morning of surgery, and which medications you will need to hold for surgery. Labs (such as blood work, EKG) may be done at your pre-op appointment. You are not required to fast for these labs. Should you need to change your pre-op appointment, please call Pre-admit testing at [phone].    Blood thinners:  Warfarin: Addendum 05/06/24: Per Dr Francyne: stop warfarin 5 days prior, resume 3 days after surgery.   Surgical clearance: we will send a clearance form to  [facility] Croitoru,MD    Common restrictions after surgery: Avoid lifting objects heavier than 10 pounds for the first 6 weeks after surgery. Where possible, avoid household activities that involve lifting, bending, reaching, pushing, or pulling such as laundry, vacuuming, grocery shopping, and childcare. Try to arrange for help from friends and family for these activities while you heal. Do not drive while taking prescription pain medication.    How to contact us :  If you have any questions/concerns before or after surgery, you can reach us  at [phone], or you can send a mychart message. We can be reached by phone or mychart 8am-4pm, Monday-Friday.  *Please note: Calls after  4pm are forwarded to a third party answering service. Mychart messages are not routinely monitored during evenings, weekends, and holidays. Please call our office to contact the answering service for urgent concerns during non-business hours.  If you have FMLA/disability paperwork, please drop it off or fax it to [phone], attention Patty.  Appointments/FMLA & disability paperwork: Odetta Mora, & Ritta Registered Nurses/Surgery schedulers: Pearle Wandler & Lauren Medical Assistants: Damien ODESSIA Sailors Physician Assistants: Lyle Decamp, PA-C, Edsel Goods, PA-C & Glade Boys, PA-C Surgeons: Reeves Daisy, MD & Penne Sharps, MD   [facility] PREADMIT TESTING VISIT and SURGERY INFORMATION SHEET   Now that surgery has been scheduled you can anticipate several phone calls from [facility] services. A pharmacy technician will call you to verify your current list of medications taken at home.               The Pre-Service Center will call to verify your insurance information and to give you billing estimates and information.             The Preadmit Testing Office will be calling to schedule a visit to obtain information for the anesthesia team and provide instructions on preparation for surgery.  What can you expect for the Preadmit Testing Visit: Appointments may be scheduled in-person or by telephone.  If a telephone visit is scheduled, you may be asked to come into the office to have lab tests or other studies performed.   This visit will not be completed any greater than 14 days prior to your surgery.  If your surgery has been scheduled for a future date,  please do not be alarmed if we have not contacted you to schedule an appointment more than a month prior to the surgery date.    Please be prepared to provide the following information during this appointment:            -Personal medical history                                               -Medication and allergy  list            -Any history of problems with anesthesia              -Recent lab work or diagnostic studies            -Please notify us  of any needs we should be aware of to provide the best care possible           -You will be provided with instructions on how to prepare for your surgery.    On The Day of Surgery:  You must have a driver to take you home after surgery, you will be asked not to drive for 24 hours following surgery.  Taxi, Gisele and non-medical transport will not be acceptable means of transportation unless you have a responsible individual who will be traveling with you.  Visitors in the surgical area:   2 people will be able to visit you in your room once your preparation for surgery has been completed. During surgery, your visitors will be asked to wait in the Surgery Waiting Area.  It is not a requirement for them to stay, if they prefer to leave and come back.  Your visitor(s) will be given an update once the surgery has been completed.  No visitors are allowed in the initial recovery room to respect patient privacy and safety.  Once you are more awake and transfer to the secondary recovery area, or are transferred to an inpatient room, visitors will again be able to see you.  To respect and protect your privacy: We will ask on the day of surgery who your driver will be and what the contact number for that individual will be. We will ask if it is okay to share information with this individual, or if there is an alternative individual that we, or the surgeon, should contact to provide updates and information. If family or friends come to the surgical information desk requesting information about you, who you have not listed with us , no information will be given.   It may be helpful to designate someone as the main contact who will be responsible for updating your other friends and family.    PREADMIT TESTING OFFICE: [phone] SAME DAY SURGERY: [phone] We look  forward to caring for you before and throughout the process of your surgery.

## 2024-04-01 NOTE — Progress Notes (Signed)
 Referring Physician:  [name], MD [address] [place],  [place] [address]  Primary Physician:  [name], MD  History of Present Illness: 04/01/2024 [name] has a history of afib, HTN, AAA, heart failure, OSA, hyperlipidemia, TIA.  He has a pacemaker.  He has been having numbness and weakness in his left lower extremity.  He has been worked up by [facility] as well as Dr. 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  He states that this continues to get worse.  He does not smoke.   Bowel/Bladder Dysfunction: he has urinary urgency. No bowel issues. No perineal numbness.   Conservative measures:  Physical therapy: has not participated in PT Multimodal medical therapy including regular antiinflammatories:  gabapentin Injections:  no epidural steroid injections  Past Surgery: no spinal surgeries  The symptoms are causing a significant impact on the patient's life.   Review of Systems:  A 10 point review of systems is negative, except for the pertinent positives and negatives detailed in the HPI.  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   LEFT THUMB    Stroke [facility])    Syncope    2D Echocardiogram 09/27/2010 EF between 40 to 45%   Tachycardia-bradycardia (HCC)    Transient ischemic attack (TIA) 06/07    Past Surgical History: Past Surgical History:  Procedure Laterality Date   COLONOSCOPY     COLONOSCOPY N/A 06/07/2017   Procedure: COLONOSCOPY;  Surgeon: [name], MD;  Location: [facility];  Service: Endoscopy;  Laterality: N/A;  9:30   COLONOSCOPY N/A 09/20/2018   Procedure: COLONOSCOPY;  Surgeon: [name], MD;  Location: [facility];  Service: Endoscopy;  Laterality: N/A;  1030   COLONOSCOPY WITH PROPOFOL  N/A 11/17/2023   Procedure: COLONOSCOPY WITH PROPOFOL ;  Surgeon: [name], MD;  Location: [facility];  Service: Gastroenterology;  Laterality: N/A;  9:15AM;ASA 3   ELECTROCARDIOGRAM     INSERT / REPLACE / REMOVE PACEMAKER  05/2011   LOOP RECORDER IMPLANT  02/02/2011   Medtronic- CRM    PACEMAKER INSERTION  05/30/2011   Medtronic Revo   last checked 11/07/2012   POLYPECTOMY  09/20/2018   Procedure: POLYPECTOMY;  Surgeon: [name], MD;  Location: [facility];  Service: Endoscopy;;  colon   POLYPECTOMY  11/17/2023   Procedure: POLYPECTOMY;  Surgeon: [name], MD;  Location: [facility];  Service: Gastroenterology;;   [facility] GENERATOR CHANGEOUT N/A 12/30/2020   Procedure: PPM GENERATOR CHANGEOUT;  Surgeon: Francyne Headland, MD;  Location: [facility] INVASIVE CV LAB;  Service: Cardiovascular;  Laterality: N/A;    Allergies: Allergies as of 04/01/2024 - Review Complete 04/01/2024  Allergen Reaction Noted   Prevnar 13 [pneumococcal 13-val conj vacc]  06/23/2022    Medications: Outpatient Encounter Medications as of 04/01/2024  Medication Sig   carvedilol  (COREG ) 25 MG tablet Take 2 tablets (50 mg total) by mouth 2 (  two) times daily with a meal.   Cyanocobalamin  (B-12 PO) Take 1 capsule by mouth daily.   diazepam (VALIUM) 2 MG tablet Take 2 mg by mouth every 8 (eight) hours as needed for anxiety.    dofetilide  (TIKOSYN ) 250 MCG capsule TAKE ONE (1) CAPSULE BY MOUTH TWICE A DAY. (EVERY 12 HOURS.)   finasteride  (PROSCAR ) 5 MG tablet Take 5 mg by mouth daily.   furosemide  (LASIX ) 20 MG tablet Take 1 tablet (20 mg total) by mouth daily as needed for fluid or edema (20 MG daily as needed for swelling- take potassium when you take  this).   gabapentin (NEURONTIN) 100 MG capsule Take 100 mg by mouth 3 (three) times daily.   irbesartan  (AVAPRO ) 75 MG tablet Take 1 tablet (75 mg total) by mouth daily.   magnesium  oxide (MAG-OX) 400 MG tablet Take 1 tablet (400 mg total) by mouth daily.   Misc Natural Products (NEURIVA) CAPS Take 1 capsule by mouth daily. For memory   Multiple Vitamin (MULTIVITAMIN WITH MINERALS) TABS tablet Take 1 tablet by mouth daily. Adult Multivitamin 50+   Polyethyl Glycol-Propyl Glycol 0.4-0.3 % SOLN Place 1 drop into both eyes 3 (three) times daily as needed (for dry eyes.).   potassium chloride  (KLOR-CON ) 10 MEQ tablet Take 2 tablets (20 mEq total) by mouth daily as needed (take 2 tablets daily when you take Fursosemide).   rosuvastatin  (CRESTOR ) 20 MG tablet TAKE ONE TABLET (20MG  TOTAL) BY MOUTH DAILY   Saw Palmetto 450 MG CAPS Take 900 mg by mouth daily.   trospium (SANCTURA) 20 MG tablet Take 20 mg by mouth daily.   warfarin (COUMADIN ) 5 MG tablet TAKE ONE-HALF TO ONE TABLET BY MOUTH EVERY DAY AS DIRECTED BY COUMADIN  CLINIC   No facility-administered encounter medications on file as of 04/01/2024.    Social History: Social History   Tobacco Use   Smoking status: Never   Smokeless tobacco: Never   Tobacco comments:    Never smoked 06/28/23  Vaping Use   Vaping status: Never Used  Substance Use Topics   Alcohol  use: Not Currently    Comment: occasionally wine   Drug use: No    Family Medical History: Family History  Problem Relation Age of Onset   Hypertension Mother    Heart attack Father 65       deceased from heart attack   Hypertension Brother    Colon cancer Neg Hx     Physical Examination: Vitals:   04/01/24 0927  BP: 118/78      Awake, alert, oriented to person, place, and time.  Speech is clear and fluent. Fund of knowledge is appropriate.   Cranial Nerves: Pupils equal round and reactive to light.  Facial tone is symmetric.    No abnormal lesions on exposed  skin.   Strength: Side Iliopsoas Quads Hamstring PF DF EHL EDC inv ev  R 5 5 5 5 5 5 5 5 5   L 5 5 5 5  4- 3 2 5  4-   Reflexes are 2+ and symmetric at the biceps, brachioradialis, patella and achilles.   Hoffman's is absent.  Clonus is not present.   Bilateral upper and lower extremity sensation is intact to light touch, but diminished in left foot in peroneal distribution  He can toe stand on both feet- has trouble doing independently on one leg. He has difficulty heel standing on left.   Gait is slow.   Medical Decision Making  Imaging: Lumbar MRI dated 02/22/24:  FINDINGS: Segmentation: Conventional anatomy assumed, with the last open disc space designated L5-S1.Concordant with prior imaging.   Alignment:  Stable minimal degenerative anterolisthesis at L4-5.   Vertebrae: No worrisome osseous lesion, acute fracture or pars defect. Scattered small hemangiomas. Mild sacroiliac degenerative changes bilaterally.   Conus medullaris: Extends to the L2 level. The conus and cauda equina appear normal.   Paraspinal and other soft tissues: No significant paraspinal findings. Known abdominal aortic aneurysm is suboptimally evaluated, largely obscured by a saturation band on the sagittal images. Based on the axial images, this appears similar to recent CTA, measuring up to 3.8 cm on image 24/5.   Disc levels:   Sagittal images demonstrate no significant disc space findings within the visualized lower thoracic spine. There is mild disc bulging and anterior osteophytes at T10-11 and T11-12 without resulting spinal stenosis or nerve root encroachment.   T12-L1: No significant findings.   L1-2: No significant findings.   L2-3: Mildly increased loss of disc height with annular disc bulging and mild facet hypertrophy. Mild narrowing of the left lateral recess without definite nerve root encroachment. The spinal canal and foramina remain patent.   L3-4: Preserved disc height and  hydration. Stable mild disc bulging and mild bilateral facet hypertrophy. No significant spinal stenosis or foraminal narrowing.   L4-5: Stable mild loss of disc height with mild disc bulging and moderate bilateral facet hypertrophy. Similar mild lateral recess and foraminal narrowing bilaterally without definite nerve root encroachment. The spinal canal remains patent.   L5-S1: Stable loss of disc height with annular disc bulging and endplate osteophytes asymmetric to the right. Mild facet and ligamentous hypertrophy. Mildly increased right foraminal narrowing without definite nerve root encroachment. The spinal canal and left foramen remain patent.   IMPRESSION: 1. No acute findings or clear explanation for the patient's symptoms. 2. Mildly increased disc bulging and facet hypertrophy at L2-3 with mild narrowing of the left lateral recess. 3. Stable mild lateral recess and foraminal narrowing bilaterally at L4-5. 4. Mildly increased right foraminal narrowing at L5-S1 without definite nerve root encroachment. 5. Known abdominal aortic aneurysm appears similar to recent CTA.     Electronically Signed   By: Elsie Perone M.D.   On: 03/25/2024 11:36  I have personally reviewed the images and agree with the above interpretation.    EMG of bilateral lower extremities dated 04/21/24:    I personally reviewed his MRI, did demonstrate degenerative disease, however his pattern is much more consistent with a peripheral entrapment rather than a spinal level entrapment.  We did discuss that he may need to address his spine in the future but at this point we feel the highest likelihood improvement will come from a peroneal decompression alone.   Assessment and Plan: Mr. Welker has constant numbness in left foot with weakness.  He has been seen by Glade Boys as well as Tonita Blanch.  They diagnosed him with a left-sided peroneal neuropathy.  He does have a mild foot drop with significant  weakness in his toe extensors and more mild but present weakness in dorsiflexion and eversion.  He has a positive Tinel sign at the fibular neck.  EMG demonstrated a demyelinating lesion at the fibular neck.  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  With this in mind he likely has a compression at the fibular head and will require a decompression.  Given his presentation I like to get an MRI of the knee  to evaluate for any intraneural cyst formation as this would cause us  to add a nerve transection of the articular branch to the surgical plan.  At this point we will plan for decompression alone pending results of the MRI.  We discussed risk and benefits of surgery including but not limited to in his case incomplete improvement in sensation loss, incomplete improvement in motor function, bleeding, numbness tingling nerve injury, infection.  Penne MICAEL Sharps, MD Dept. of Neurosurgery

## 2024-04-02 ENCOUNTER — Telehealth: Payer: Self-pay | Admitting: *Deleted

## 2024-04-02 NOTE — Telephone Encounter (Signed)
   Pre-operative Risk Assessment    Patient Name: Alex Maddox  DOB: 10-07-1951 MRN: [id number]   Date of last office visit: 01/17/24 DR. CROITORU Date of next office visit: NONE   Request for Surgical Clearance    Procedure:  Left Peroneal Nerve Decompression  Date of Surgery:  Clearance 05/21/24                                Surgeon:  DR. PENNE SHARPS Surgeon's Group or Practice Name:  [facility] AT [place] Phone number:  [phone] Fax number:  [phone]   Type of Clearance Requested:   - Medical  - Pharmacy:  Hold Warfarin (Coumadin ) x 7 DAYS PRIOR; RESUME 7 DAYS POST OP   Type of Anesthesia:  MAC   Additional requests/questions:    Alex Maddox   04/02/2024, 3:04 PM    Letter  SHARPS PENNE ORN, MD on 04/01/2024     [facility] Neurosurgery at [facility] [address], [address] [place], [place]  [address] Phone:  [phone]   Fax:  [phone]   Alex Maddox  Jun 29, 1951   04/01/2024        Faxed to: Jerel Croitoru,MD    Surgeon:  PENNE SHARPS, MD    Date of procedure: 05/21/2024   Planned procedure & Anesthesia Type: Left Peroneal Nerve Decompression/ Monitored Anesthesia Care   Should you choose to see this patient in your office to provide clearance, please ask your office staff to contact the patient directly.  Please fax your evaluation and any supporting documentation as soon as completed.  Thank you! Your assistance is greatly appreciated!   Note: We have instructed patient to hold the following medications for the procedure as listed below:   Hold: Warfarin stop 7 days prior, after resume 7 days after if a peripheral nerve.    Is this patient optimized to have the above procedure? Yes or No     If NO, please indicate further studies/evaluations recommended   Risk: Low      Moderate        High   Remarks:         Physician Name:    Physician Signature:  ________________________________Date:_________________                       [facility] Neurosurgery at [facility] [place], [phone]                        1

## 2024-04-03 DIAGNOSIS — Z7689 Persons encountering health services in other specified circumstances: Secondary | ICD-10-CM | POA: Diagnosis not present

## 2024-04-03 DIAGNOSIS — G589 Mononeuropathy, unspecified: Secondary | ICD-10-CM | POA: Diagnosis not present

## 2024-04-03 DIAGNOSIS — I10 Essential (primary) hypertension: Secondary | ICD-10-CM | POA: Diagnosis not present

## 2024-04-03 DIAGNOSIS — E782 Mixed hyperlipidemia: Secondary | ICD-10-CM | POA: Diagnosis not present

## 2024-04-03 DIAGNOSIS — Z8679 Personal history of other diseases of the circulatory system: Secondary | ICD-10-CM | POA: Diagnosis not present

## 2024-04-04 ENCOUNTER — Telehealth: Payer: Self-pay | Admitting: *Deleted

## 2024-04-04 ENCOUNTER — Ambulatory Visit: Payer: Self-pay | Admitting: Cardiovascular Disease

## 2024-04-04 ENCOUNTER — Encounter: Payer: Self-pay | Admitting: Cardiovascular Disease

## 2024-04-04 NOTE — Telephone Encounter (Signed)
 Pt's wife states the requesting office will also need device clearance. I did tell her they will need to send over device clearance form for the device clinic to address.    I will send this to the device pool as well.            Addend   Delete   Tag   Copy   Wynetta Niels HERO, CMA Certified Medical Assistant   Telephone Encounter Signed   Creation Time: 04/04/2024  1:21 PM   Signed     S/w both the pt and his wife and they have agreed to tele preop appt 04/30/24. Med rec and consent are done.            Patient Consent for Virtual Visit        Alex Maddox has provided verbal consent on 04/04/2024 for a virtual visit (video or telephone).   CONSENT FOR VIRTUAL VISIT FOR:  Alex Maddox  By participating in this virtual visit I agree to the following:  I hereby voluntarily request, consent and authorize [facility] and its employed or contracted physicians, physician assistants, nurse practitioners or other licensed health care professionals (the Practitioner), to provide me with telemedicine health care services (the "Services) as deemed necessary by the treating Practitioner. I acknowledge and consent to receive the Services by the Practitioner via telemedicine. I understand that the telemedicine visit will involve communicating with the Practitioner through live audiovisual communication technology and the disclosure of certain medical information by electronic transmission. I acknowledge that I have been given the opportunity to request an in-person assessment or other available alternative prior to the telemedicine visit and am voluntarily participating in the telemedicine visit.  I understand that I have the right to withhold or withdraw my consent to the use of telemedicine in the course of my care at any time, without affecting my right to future care or treatment, and that the Practitioner or I may terminate the telemedicine visit at any time. I understand  that I have the right to inspect all information obtained and/or recorded in the course of the telemedicine visit and may receive copies of available information for a reasonable fee.  I understand that some of the potential risks of receiving the Services via telemedicine include:  Delay or interruption in medical evaluation due to technological equipment failure or disruption; Information transmitted may not be sufficient (e.g. poor resolution of images) to allow for appropriate medical decision making by the Practitioner; and/or  In rare instances, security protocols could fail, causing a breach of personal health information.  Furthermore, I acknowledge that it is my responsibility to provide information about my medical history, conditions and care that is complete and accurate to the best of my ability. I acknowledge that Practitioner's advice, recommendations, and/or decision may be based on factors not within their control, such as incomplete or inaccurate data provided by me or distortions of diagnostic images or specimens that may result from electronic transmissions. I understand that the practice of medicine is not an exact science and that Practitioner makes no warranties or guarantees regarding treatment outcomes. I acknowledge that a copy of this consent can be made available to me via my patient portal [facility] MyChart), or I can request a printed copy by calling the office of [facility].    I understand that my insurance will be billed for this visit.   I have read or had this consent read to me. I  understand the contents of this consent, which adequately explains the benefits and risks of the Services being provided via telemedicine.  I have been provided ample opportunity to ask questions regarding this consent and the Services and have had my questions answered to my satisfaction. I give my informed consent for the services to be provided through the use of telemedicine  in my medical care

## 2024-04-04 NOTE — Telephone Encounter (Signed)
   Name: Alex Maddox  DOB: 05/07/1951  MRN: [id number]  Primary Cardiologist: Jerel Balding, MD Last OV: 01/17/24  Preoperative team, please contact this patient and set up a phone call appointment for further preoperative risk assessment. Please obtain consent and complete medication review. Thank you for your help.  I confirm that guidance regarding antiplatelet and oral anticoagulation therapy has been completed and, if necessary, noted below.  Per Dr. Balding, Hold warfarin for 5 days prior to procedure, resume no later than 3 days after the procedure if there are no bleeding complications. It will take another 4-5 days to reach therapeutic INR after restarting the warfarin   I also confirmed the patient resides in the state of [place] . As per [facility] Medical Board telemedicine laws, the patient must reside in the state in which the provider is licensed.   Sukanya Goldblatt D Ethylene Reznick, NP 04/04/2024, 12:49 PM [facility]

## 2024-04-04 NOTE — Telephone Encounter (Signed)
 Patient with diagnosis of atrial fibrillation on warfarin for anticoagulation.    Procedure:  Left Peroneal Nerve Decompression   Date of Surgery:  Clearance 05/21/24   CHA2DS2-VASc Score = 6   This indicates a 9.7% annual risk of stroke. The patient's score is based upon: CHF History: 1 HTN History: 1 Diabetes History: 0 Stroke History: 2 Vascular Disease History: 1 Age Score: 1 Gender Score: 0   Per chart has hx of TIA prior to 2014  CrCl 70 Platelet count 122  Per office protocol, patient can hold warfarin for 5 days prior to procedure.   Patient will not need bridging with Lovenox  (enoxaparin ) around procedure.  Surgeon is asking for hold of 7 days prior and 7 days after.  Will need to review with primary cardiologist for final decision.   **This guidance is not considered finalized until pre-operative APP has relayed final recommendations.**

## 2024-04-04 NOTE — Telephone Encounter (Signed)
 Pt's wife states the requesting office will also need device clearance. I did tell her they will need to send over device clearance form for the device clinic to address.   I will send this to the device pool as well.

## 2024-04-04 NOTE — Telephone Encounter (Signed)
 Hold 5 days before (7 is unnecessarily long). Resume no later than 3 days after the procedure if there are no bleeding complications. It will take another 4-5 days to reach therapeutic INR after restarting the warfarin.

## 2024-04-04 NOTE — Progress Notes (Signed)
 PERIOPERATIVE PRESCRIPTION FOR IMPLANTED CARDIAC DEVICE PROGRAMMING  Patient Information: Name:  Alex Maddox  DOB:  1951-01-31  MRN:  [id number]  Procedure:  Left Peroneal Nerve Decompression   Date of Surgery:  Clearance 05/21/24                                  Surgeon:  DR. PENNE SHARPS Surgeon's Group or Practice Name:  [facility] [place] Phone number:  [phone] Fax number:  [phone]   Type of Clearance Requested:   - Medical  - Pharmacy:  Hold Warfarin (Coumadin ) x 7 DAYS PRIOR; RESUME 7 DAYS POST OP   Type of Anesthesia:  MAC  Device Information:  Clinic EP Physician:  Dr. CHRISTELLA. Croitoru  Device Type:  Pacemaker Manufacturer and Phone #:  Medtronic: [phone] Pacemaker Dependent?:  No. Date of Last Device Check:  03/28/2024 Normal Device Function?:  Yes.    Electrophysiologist's Recommendations:  Have magnet available. Provide continuous ECG monitoring when magnet is used or reprogramming is to be performed.  Procedure should not interfere with device function.  No device programming or magnet placement needed.  Per Device Clinic Standing Orders, [name], RN  2:36 PM 04/04/2024

## 2024-04-04 NOTE — Telephone Encounter (Signed)
 S/w both the pt and his wife and they have agreed to tele preop appt 04/30/24. Med rec and consent are done.

## 2024-04-09 DIAGNOSIS — Z7689 Persons encountering health services in other specified circumstances: Secondary | ICD-10-CM | POA: Diagnosis not present

## 2024-04-09 DIAGNOSIS — I10 Essential (primary) hypertension: Secondary | ICD-10-CM | POA: Diagnosis not present

## 2024-04-09 NOTE — Telephone Encounter (Signed)
 I will update the requesting office in regard to device clearance. Please see notes from Delon Sharps, RN.    Pt has a tele preop appt 04/30/24 for clearance.

## 2024-04-11 NOTE — Telephone Encounter (Signed)
 Notified patient of changes via mychart message. Updated instructions that were written on his AVS. Updated nursing communication order for pre-admit testing with new instructions.

## 2024-04-11 NOTE — Addendum Note (Signed)
 Addended by: Rim Thatch on: 04/11/2024 04:51 PM   Modules accepted: Orders

## 2024-04-16 ENCOUNTER — Ambulatory Visit: Attending: Cardiovascular Disease | Admitting: *Deleted

## 2024-04-16 DIAGNOSIS — Z5181 Encounter for therapeutic drug level monitoring: Secondary | ICD-10-CM | POA: Diagnosis not present

## 2024-04-16 DIAGNOSIS — I4891 Unspecified atrial fibrillation: Secondary | ICD-10-CM

## 2024-04-16 LAB — POCT INR: INR: 3 (ref 2.0–3.0)

## 2024-04-16 NOTE — Patient Instructions (Signed)
 Continue warfarin 1 tablet daily. Pending peroneal nerve decompression on 05/21/24.  Will hold warfarin 7 days before procedure and 3 days after procedure.  Resume coumadin  on 05/24/24 Recheck on 06/03/24

## 2024-04-17 DIAGNOSIS — R7303 Prediabetes: Secondary | ICD-10-CM | POA: Diagnosis not present

## 2024-04-17 DIAGNOSIS — G589 Mononeuropathy, unspecified: Secondary | ICD-10-CM | POA: Diagnosis not present

## 2024-04-17 DIAGNOSIS — Z0000 Encounter for general adult medical examination without abnormal findings: Secondary | ICD-10-CM | POA: Diagnosis not present

## 2024-04-17 DIAGNOSIS — I10 Essential (primary) hypertension: Secondary | ICD-10-CM | POA: Diagnosis not present

## 2024-04-17 DIAGNOSIS — Z0001 Encounter for general adult medical examination with abnormal findings: Secondary | ICD-10-CM | POA: Diagnosis not present

## 2024-04-17 DIAGNOSIS — Z8679 Personal history of other diseases of the circulatory system: Secondary | ICD-10-CM | POA: Diagnosis not present

## 2024-04-17 DIAGNOSIS — E782 Mixed hyperlipidemia: Secondary | ICD-10-CM | POA: Diagnosis not present

## 2024-04-17 DIAGNOSIS — Z79899 Other long term (current) drug therapy: Secondary | ICD-10-CM | POA: Diagnosis not present

## 2024-04-18 NOTE — Telephone Encounter (Addendum)
 Received notification that pt has not read the mychart message from 04/11/24 about the change to his warfarin for surgery. Left message on pt's home # to return my call or check his mychart message. Also left message on pt's secondary #.

## 2024-04-19 NOTE — Progress Notes (Signed)
  Device system confirmed to be MRI conditional, with implant date > 6 weeks ago, and no evidence of abandoned or epicardial leads in review of most recent CXR Device last cleared by EP Provider:  Daphne Barrack, NP  Clearance is good through for 1 year as long as parameters remain stable at time of check. If pt undergoes a cardiac device procedure during that time, they should be re-cleared.   Tachy-therapies to be programmed off if applicable with device back to pre-MRI settings after completion of exam.  Medtronic - Programming recommendation received through Medtronic App/Tablet  Suzan Recardo Arna Debby  04/19/2024 2:22 PM

## 2024-04-22 NOTE — Progress Notes (Signed)
Please see anticoagulation encounter.

## 2024-04-23 ENCOUNTER — Ambulatory Visit (HOSPITAL_COMMUNITY)
Admission: RE | Admit: 2024-04-23 | Discharge: 2024-04-23 | Disposition: A | Discharge status: Home / Self Care | Source: Ambulatory Visit | Attending: Neurosurgery | Admitting: Neurosurgery

## 2024-04-23 DIAGNOSIS — M25462 Effusion, left knee: Secondary | ICD-10-CM | POA: Diagnosis not present

## 2024-04-23 DIAGNOSIS — M1712 Unilateral primary osteoarthritis, left knee: Secondary | ICD-10-CM | POA: Diagnosis not present

## 2024-04-23 DIAGNOSIS — G5732 Lesion of lateral popliteal nerve, left lower limb: Secondary | ICD-10-CM | POA: Insufficient documentation

## 2024-04-23 MED ORDER — GADOBUTROL 1 MMOL/ML IV SOLN
8.5000 mL | Freq: Once | INTRAVENOUS | Status: AC | PRN
  Administered 2024-04-23: 8.5 mL via INTRAVENOUS

## 2024-04-26 ENCOUNTER — Inpatient Hospital Stay: Payer: Medicare Other | Attending: Oncology

## 2024-04-26 ENCOUNTER — Ambulatory Visit: Payer: Self-pay | Admitting: Neurosurgery

## 2024-04-26 DIAGNOSIS — D696 Thrombocytopenia, unspecified: Secondary | ICD-10-CM | POA: Diagnosis not present

## 2024-04-26 LAB — CBC WITH DIFFERENTIAL/PLATELET
Abs Immature Granulocytes: 0.02 K/uL (ref 0.00–0.07)
Basophils Absolute: 0 K/uL (ref 0.0–0.1)
Basophils Relative: 1 %
Eosinophils Absolute: 0.2 K/uL (ref 0.0–0.5)
Eosinophils Relative: 3 %
HCT: 47.7 % (ref 39.0–52.0)
Hemoglobin: 15.5 g/dL (ref 13.0–17.0)
Immature Granulocytes: 0 %
Lymphocytes Relative: 21 %
Lymphs Abs: 1.6 K/uL (ref 0.7–4.0)
MCH: 31.5 pg (ref 26.0–34.0)
MCHC: 32.5 g/dL (ref 30.0–36.0)
MCV: 97 fL (ref 80.0–100.0)
Monocytes Absolute: 0.9 K/uL (ref 0.1–1.0)
Monocytes Relative: 12 %
Neutro Abs: 4.7 K/uL (ref 1.7–7.7)
Neutrophils Relative %: 63 %
Platelets: 105 K/uL — ABNORMAL LOW (ref 150–400)
RBC: 4.92 MIL/uL (ref 4.22–5.81)
RDW: 12.5 % (ref 11.5–15.5)
WBC: 7.4 K/uL (ref 4.0–10.5)
nRBC: 0 % (ref 0.0–0.2)

## 2024-04-26 LAB — COMPREHENSIVE METABOLIC PANEL WITH GFR
ALT: 36 U/L (ref 0–44)
AST: 25 U/L (ref 15–41)
Albumin: 3.9 g/dL (ref 3.5–5.0)
Alkaline Phosphatase: 53 U/L (ref 38–126)
Anion gap: 12 (ref 5–15)
BUN: 28 mg/dL — ABNORMAL HIGH (ref 8–23)
CO2: 26 mmol/L (ref 22–32)
Calcium: 9.9 mg/dL (ref 8.9–10.3)
Chloride: 102 mmol/L (ref 98–111)
Creatinine, Ser: 1.3 mg/dL — ABNORMAL HIGH (ref 0.61–1.24)
GFR, Estimated: 59 mL/min — ABNORMAL LOW (ref >60)
Glucose, Bld: 73 mg/dL (ref 70–99)
Potassium: 3.9 mmol/L (ref 3.5–5.1)
Sodium: 140 mmol/L (ref 135–145)
Total Bilirubin: 1 mg/dL (ref 0.0–1.2)
Total Protein: 6.9 g/dL (ref 6.5–8.1)

## 2024-04-30 ENCOUNTER — Ambulatory Visit: Attending: Internal Medicine | Admitting: Nurse Practitioner

## 2024-04-30 ENCOUNTER — Encounter: Payer: Self-pay | Admitting: Nurse Practitioner

## 2024-04-30 DIAGNOSIS — Z01810 Encounter for preprocedural cardiovascular examination: Secondary | ICD-10-CM | POA: Diagnosis not present

## 2024-04-30 NOTE — Progress Notes (Signed)
 Virtual Visit via Telephone Note   Because of Alex Maddox co-morbid illnesses, he is at least at moderate risk for complications without adequate follow up.  This format is felt to be most appropriate for this patient at this time.  Due to technical limitations with video connection (technology), today's appointment will be conducted as an audio only telehealth visit, and Alex Maddox verbally agreed to proceed in this manner.   All issues noted in this document were discussed and addressed.  No physical exam could be performed with this format.  Evaluation Performed:  Preoperative cardiovascular risk assessment _____________   Date:  04/30/2024   Patient ID:  Alex Maddox, DOB 1951-05-30, MRN [id number] Patient Location:  Home Provider location:   Office  Primary Care Provider:  [name], MD Primary Cardiologist:  Jerel Balding, MD  Chief Complaint / Patient Profile   73 y.o. y/o male with a h/o PAF on chronic anticoagulation, chronic combined diastolic and systolic heart failure, thoracic and abdominal aortic aneurysm, neurally mediated syncope, tachy-brady syndrome s/p PPM implant, HTN, coronary calcification who is pending left peroneal nerve decompression with Dr Claudene on 8/12 and presents today for telephonic preoperative cardiovascular risk assessment.  History of Present Illness    Alex Maddox is a 73 y.o. male who presents via audio/video conferencing for a telehealth visit today.  Pt was last seen in cardiology clinic on 01/17/24 by Dr. Balding.  At that time STPEHEN PETITJEAN was doing well.  The patient is now pending procedure as outlined above. Since his last visit, he denies chest pain, shortness of breath, lower extremity edema, fatigue, palpitations, melena, hematuria, hemoptysis, diaphoresis, weakness, presyncope, syncope, orthopnea, and PND. He remains active with walking and yard work and is able to achieve > 4 METS activity without concerning cardiac symptoms.     Past Medical History    Past Medical History:  Diagnosis Date   Abnormal echocardiogram    stress myoview 10/29/2010   Arthritis     IN MY BACK    Dyslipidemia    Dysrhythmia    Atrial fibrillation   Hypertension    Paroxysmal atrial fibrillation (HCC)    Presence of permanent cardiac pacemaker    Sinus node dysfunction (HCC)    Sleep apnea, obstructive    sleep study 10/09/2007  AHI 5.73/hr  REM AHI 11.90/hr    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   LEFT THUMB    Stroke [facility])    Syncope    2D Echocardiogram 09/27/2010 EF between 40 to 45%   Tachycardia-bradycardia (HCC)    Transient ischemic attack (TIA) 06/07   Past Surgical History:  Procedure Laterality Date   COLONOSCOPY     COLONOSCOPY N/A 06/07/2017   Procedure: COLONOSCOPY;  Surgeon: [name], MD;  [facility];  Service: Endoscopy;  Laterality: N/A;  9:30   COLONOSCOPY N/A 09/20/2018   Procedure: COLONOSCOPY;  Surgeon: [name], MD;  Location: [facility];  Service: Endoscopy;  Laterality: N/A;  1030   COLONOSCOPY WITH PROPOFOL  N/A 11/17/2023   Procedure: COLONOSCOPY WITH PROPOFOL ;  Surgeon: [name], MD;  Location: [facility];  Service: Gastroenterology;  Laterality: N/A;  9:15AM;ASA 3   ELECTROCARDIOGRAM     INSERT / REPLACE / REMOVE PACEMAKER  05/2011   LOOP RECORDER IMPLANT  02/02/2011   Medtronic- CRM    PACEMAKER INSERTION  05/30/2011   Medtronic Revo   last checked 11/07/2012  POLYPECTOMY  09/20/2018   Procedure: POLYPECTOMY;  Surgeon: [name], MD;  Location: [facility];  Service: Endoscopy;;  colon   POLYPECTOMY  11/17/2023   Procedure: POLYPECTOMY;  Surgeon: [name], MD;  Location: [facility];  Service: Gastroenterology;;   [facility] GENERATOR CHANGEOUT N/A 12/30/2020   Procedure: PPM GENERATOR CHANGEOUT;  Surgeon: Francyne Headland, MD;  Location: [facility] INVASIVE CV LAB;  Service: Cardiovascular;  Laterality: N/A;     Allergies  Allergies  Allergen Reactions   Prevnar 13 [Pneumococcal 13-Val Conj Vacc]     Rash, caused him to be unconscious     Home Medications    Prior to Admission medications   Medication Sig Start Date End Date Taking? Authorizing Provider  carvedilol  (COREG ) 25 MG tablet Take 2 tablets (50 mg total) by mouth 2 (two) times daily with a meal. 01/01/24   Fenton, Clint R, PA  Cyanocobalamin  (B-12 PO) Take 1 capsule by mouth daily.    [provider]  diazepam (VALIUM) 2 MG tablet Take 2 mg by mouth every 8 (eight) hours as needed for anxiety.     [provider]  dofetilide  (TIKOSYN ) 250 MCG capsule TAKE ONE (1) CAPSULE BY MOUTH TWICE A DAY. (EVERY 12 HOURS.) 11/21/23   Fenton, Clint R, PA  finasteride  (PROSCAR ) 5 MG tablet Take 5 mg by mouth daily.    [provider]  furosemide  (LASIX ) 20 MG tablet Take 1 tablet (20 mg total) by mouth daily as needed for fluid or edema (20 MG daily as needed for swelling- take potassium when you take this). 01/17/24 04/16/24  Croitoru, Mihai, MD  gabapentin (NEURONTIN) 100 MG capsule Take 100 mg by mouth 3 (three) times daily. 12/18/23   [provider]  irbesartan  (AVAPRO ) 75 MG tablet Take 1 tablet (75 mg total) by mouth daily. 01/17/24   Croitoru, Mihai, MD  magnesium  oxide (MAG-OX) 400 MG tablet Take 1 tablet (400 mg total) by mouth daily. 01/17/24   Croitoru, Mihai, MD  Misc Natural Products (NEURIVA) CAPS Take 1 capsule by mouth daily. For memory    [provider]  Multiple Vitamin (MULTIVITAMIN WITH MINERALS) TABS tablet Take 1 tablet by mouth daily. Adult Multivitamin 50+    [provider]  Polyethyl Glycol-Propyl Glycol 0.4-0.3 % SOLN Place 1 drop into both eyes 3 (three) times daily as needed (for dry eyes.).    [provider]  potassium chloride  (KLOR-CON ) 10 MEQ tablet Take 2 tablets (20 mEq total) by mouth daily as needed (take 2 tablets daily when you take Fursosemide). 01/17/24    Croitoru, Mihai, MD  rosuvastatin  (CRESTOR ) 20 MG tablet TAKE ONE TABLET (20MG  TOTAL) BY MOUTH DAILY 08/08/23   Croitoru, Mihai, MD  Saw Palmetto 450 MG CAPS Take 900 mg by mouth daily.    [provider]  trospium (SANCTURA) 20 MG tablet Take 20 mg by mouth daily.    [provider]  warfarin (COUMADIN ) 5 MG tablet TAKE ONE-HALF TO ONE TABLET BY MOUTH EVERY DAY AS DIRECTED BY COUMADIN  CLINIC 01/12/24   Croitoru, Headland, MD    Physical Exam    Vital Signs:  AKSHAJ BESANCON does not have vital signs available for review today.  Given telephonic nature of communication, physical exam is limited. AAOx3. NAD. Normal affect.  Speech and respirations are unlabored.  Accessory Clinical Findings    None  Assessment & Plan    1.  Preoperative Cardiovascular Risk Assessment: According to the Revised Cardiac  Risk Index (RCRI), his Perioperative Risk of Major Cardiac Event is (%): 11 which is high risk and is due to history of TIA and CHF, however he does not have evidence of volume overload and is maximized on medical therapy. His Functional Capacity in METs is: 7.59 according to the Duke Activity Status Index (DASI). The patient is doing well from a cardiac perspective. Therefore, based on ACC/AHA guidelines, the patient would be at acceptable risk for the planned procedure without further cardiovascular testing.   The patient was advised that if he develops new symptoms prior to surgery to contact our office to arrange for a follow-up visit, and he verbalized understanding.  Per Dr. Francyne, primary cardiologist: Hold 5 days before (7 is unnecessarily long). Resume no later than 3 days after the procedure if there are no bleeding complications. It will take another 4-5 days to reach therapeutic INR after restarting the warfarin.   A copy of this note will be routed to requesting surgeon.  Time:   Today, I have spent 10 minutes with the patient with telehealth technology discussing  medical history, symptoms, and management plan.     Rosaline EMERSON Bane, NP-C  04/30/2024, 9:03 AM [address], [address] [place], [place] [address] Office [phone] Fax [phone]

## 2024-05-02 ENCOUNTER — Telehealth: Payer: Self-pay

## 2024-05-02 NOTE — Telephone Encounter (Signed)
 Alex Maddox is scheduled for a left peroneal nerve decompression on 05/21/24.  Per cardiology note on 04/30/24: Per Dr. Francyne, primary cardiologist: Hold 5 days before (7 is unnecessarily long). Resume no later than 3 days after the procedure if there are no bleeding complications. It will take another 4-5 days to reach therapeutic INR after restarting the warfarin.

## 2024-05-03 ENCOUNTER — Inpatient Hospital Stay: Payer: Medicare Other | Admitting: Oncology

## 2024-05-03 DIAGNOSIS — D696 Thrombocytopenia, unspecified: Secondary | ICD-10-CM | POA: Diagnosis not present

## 2024-05-03 NOTE — Progress Notes (Signed)
   [facility] OFFICE PROGRESS NOTE  Alex Rush, MD  I connected with Alex Maddox on 05/03/24 at 11:15 AM EDT by telephone visit and verified that I am speaking with the correct person using two identifiers.   I discussed the limitations, risks, security and privacy concerns of performing an evaluation and management service by telemedicine and the availability of in-person appointments. I also discussed with the patient that there may be a patient responsible charge related to this service. The patient expressed understanding and agreed to proceed.   Other persons participating in the visit and their role in the encounter: NP. Patient    Patient's location: Home  Provider's location: Clinic    ASSESSMENT & PLAN:  Assessment & Plan Thrombocytopenia (HCC) - Hematology workup from 04/26/2024 showed no evidence of nutritional deficiencies with normal copper  and MMA or infection with nonreactive hep B and hep C results.  No evidence of connective tissue disorders with normal ANA and rheumatoid factor.  No evidence of underlying MDS with normal protein electrophoresis and immunofixation.  Reticulocyte count is also normal.  Ultrasound of the spleen is within normal limits. 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 -Recommend active surveillance every 4 to 5 months. -Labs from 04/26/2024 show platelet count 105.  -Discussed continuing to monitor and if platelets drop below 50,000 would consider treatment with steroids.   Orders Placed This Encounter  Procedures   Comprehensive metabolic panel    Standing Status:   Future    Expected Date:   10/03/2024    Expiration Date:   01/01/2025   CBC with Differential    Standing Status:   Future    Expected Date:   10/03/2024    Expiration Date:   01/01/2025    INTERVAL HISTORY: Patient returns for recurrent thrombocytopenia.  He denies any episodes of bleeding, melena or hematochezia.  States he is on  Coumadin  but will come off of Coumadin  in August.  Reports he has been having some trouble with his left lower extremity and is pending left peroneal nerve decompression with Dr. Claudene on 05/21/24.   We reviewed his CBC and CMP.  SUMMARY OF HEMATOLOGIC HISTORY: Patient is a 73 year old male with past medical history significant for hypertension, AAA, atrial fibrillation (on Coumadin ), pacemaker, sleep apnea, stroke with history of colon polyps who is followed by hematology for thrombocytopenia.  He was last seen by hematology on 11/02/2023.  Most recent colonoscopy from showed nonbleeding internal hemorrhoids, 3 to 6 mm polyps in ascending colon.  Pathology negative for dysplasia.  No results found for: CBC  There were no vitals filed for this visit.  Review of Systems  Constitutional:  Positive for malaise/fatigue.  Cardiovascular:  Positive for leg swelling.  Gastrointestinal:  Positive for diarrhea.  Neurological:  Positive for tingling (L foot).  Psychiatric/Behavioral:  The patient is nervous/anxious.    Physical Exam Neurological:     Mental Status: He is alert and oriented to person, place, and time.     I provided 20  minutes of non face-to-face telephone visit time during this encounter, and > 50% was spent counseling as documented under my assessment & plan.  Alex Hope, NP 05/03/2024 11:21 AM

## 2024-05-03 NOTE — Assessment & Plan Note (Addendum)
-   Hematology workup from 04/26/2024 showed no evidence of nutritional deficiencies with normal copper  and MMA or infection with nonreactive hep B and hep C results.  No evidence of connective tissue disorders with normal ANA and rheumatoid factor.  No evidence of underlying MDS with normal protein electrophoresis and immunofixation.  Reticulocyte count is also normal.  Ultrasound of the spleen is within normal limits. 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 -Recommend active surveillance every 4 to 5 months. -Labs from 04/26/2024 show platelet count 105.  -Discussed continuing to monitor and if platelets drop below 50,000 would consider treatment with steroids.

## 2024-05-06 NOTE — Telephone Encounter (Signed)
 Notified pt of change via mychart.

## 2024-05-06 NOTE — Addendum Note (Signed)
 Addended by: Raiyah Speakman on: 05/06/2024 10:53 AM   Modules accepted: Orders

## 2024-05-08 ENCOUNTER — Other Ambulatory Visit: Payer: Self-pay

## 2024-05-08 ENCOUNTER — Encounter: Payer: Self-pay | Admitting: Cardiovascular Disease

## 2024-05-08 ENCOUNTER — Encounter
Admission: RE | Admit: 2024-05-08 | Discharge: 2024-05-08 | Disposition: A | Discharge status: Home / Self Care | Source: Ambulatory Visit | Attending: Neurosurgery | Admitting: Neurosurgery

## 2024-05-08 VITALS — BP 97/69 | HR 60 | Resp 14 | Ht 72.0 in | Wt 191.4 lb

## 2024-05-08 DIAGNOSIS — Z01818 Encounter for other preprocedural examination: Secondary | ICD-10-CM

## 2024-05-08 HISTORY — DX: Atherosclerotic heart disease of native coronary artery without angina pectoris: I25.10

## 2024-05-08 HISTORY — DX: Syncope and collapse: R55

## 2024-05-08 HISTORY — DX: Prediabetes: R73.03

## 2024-05-08 HISTORY — DX: Other specified postprocedural states: Z98.890

## 2024-05-08 HISTORY — DX: Long term (current) use of anticoagulants: Z79.01

## 2024-05-08 HISTORY — DX: Lesion of lateral popliteal nerve, left lower limb: G57.32

## 2024-05-08 HISTORY — DX: Personal history of transient ischemic attack (TIA), and cerebral infarction without residual deficits: Z86.73

## 2024-05-08 HISTORY — DX: Aneurysm of the ascending aorta, without rupture: I71.21

## 2024-05-08 HISTORY — DX: Heart disease, unspecified: I51.9

## 2024-05-08 HISTORY — DX: Heart failure, unspecified: I50.9

## 2024-05-08 HISTORY — DX: Male erectile dysfunction, unspecified: N52.9

## 2024-05-08 HISTORY — DX: Nontraumatic intracranial hemorrhage, unspecified: I62.9

## 2024-05-08 HISTORY — DX: Hyperlipidemia, unspecified: E78.5

## 2024-05-08 HISTORY — DX: Sepsis, unspecified organism: A41.9

## 2024-05-08 HISTORY — DX: Thrombocytopenia, unspecified: D69.6

## 2024-05-08 NOTE — Progress Notes (Signed)
 PERIOPERATIVE PRESCRIPTION FOR IMPLANTED CARDIAC DEVICE PROGRAMMING  Patient Information: Name:  Alex Maddox  DOB:  [birth year]  MRN:  [id number]    Planned Procedure: OPEN LEFT PERONEAL NERVE DECOMPRESSION   Surgeon:  Dr. Penne Sharps, MD  Requesting device clearance: Dorise Pereyra, FNP-C  Date of Procedure:  05/21/2024  Cautery will be used.   Please route documentation back me via [facility], or may fax report to [facility] PAT APP at [phone].  Device Information:  Clinic EP Physician:  Dr. Nicky Croitoru   Device Type:  Pacemaker Manufacturer and Phone #:  Medtronic: [phone] Pacemaker Dependent?:  No. Date of Last Device Check:  03/28/2024  Normal Device Function?:  Yes.    Electrophysiologist's Recommendations:  Have magnet available. Provide continuous ECG monitoring when magnet is used or reprogramming is to be performed.  Procedure should not interfere with device function.  No device programming or magnet placement needed.  Per Device Clinic Standing Orders, [name], RN  5:22 PM 05/08/2024

## 2024-05-08 NOTE — Patient Instructions (Addendum)
 Your procedure is scheduled on:05-21-24 Tuesday Report to the Registration Desk on the 1st floor of the [facility].Then proceed to the 2nd floor Surgery Desk  To find out your arrival time, please call [phone] between 1PM - 3PM on:05-20-24 Monday If your arrival time is 6:00 am, do not arrive before that time as the [facility] entrance doors do not open until 6:00 am.  REMEMBER: Instructions that are not followed completely may result in serious medical risk, up to and including death; or upon the discretion of your surgeon and anesthesiologist your surgery may need to be rescheduled.  Do not eat food after midnight the night before surgery.  No gum chewing or hard candies.  You may however, drink CLEAR liquids up to 2 hours before you are scheduled to arrive for your surgery. Do not drink anything within 2 hours of your scheduled arrival time.  Clear liquids include: - water   - apple juice without pulp - gatorade (not RED colors) - black coffee or tea (Do NOT add milk or creamers to the coffee or tea) Do NOT drink anything that is not on this list.  One week prior to surgery:Last dose will be on 05-13-24  Stop ANY OVER THE COUNTER supplements until after surgery (Vitamin B12, Magnesium , Multivitamin, Equate Brain Health, Saw Palmetto)  You may however, continue to take Tylenol  if needed for pain up until the day of surgery.  Stop warfarin (COUMADIN ) 5 days prior to surgery-Last dose will be on 05-15-24 Wednesday  Continue taking all of your other prescription medications up until the day of surgery.  ON THE DAY OF SURGERY ONLY TAKE THESE MEDICATIONS WITH SIPS OF WATER : -carvedilol  (COREG )  -dofetilide  (TIKOSYN )  -finasteride  (PROSCAR )  -gabapentin (NEURONTIN)  -rosuvastatin  (CRESTOR )   No Alcohol  for 24 hours before or after surgery.  No Smoking including e-cigarettes for 24 hours before surgery.  No chewable tobacco products for at least 6 hours before surgery.  No  nicotine patches on the day of surgery.  Do not use any recreational drugs for at least a week (preferably 2 weeks) before your surgery.  Please be advised that the combination of cocaine and anesthesia may have negative outcomes, up to and including death. If you test positive for cocaine, your surgery will be cancelled.  On the morning of surgery brush your teeth with toothpaste and water , you may rinse your mouth with mouthwash if you wish. Do not swallow any toothpaste or mouthwash.  Use CHG Soap as directed on instruction sheet.  Do not wear jewelry, make-up, hairpins, clips or nail polish.  For welded (permanent) jewelry: bracelets, anklets, waist bands, etc.  Please have this removed prior to surgery.  If it is not removed, there is a chance that hospital personnel will need to cut it off on the day of surgery.  Do not wear lotions, powders, or perfumes.   Do not shave body hair from the neck down 48 hours before surgery.  Contact lenses, hearing aids and dentures may not be worn into surgery.  Do not bring valuables to the hospital. [facility] is not responsible for any missing/lost belongings or valuables.   Notify your doctor if there is any change in your medical condition (cold, fever, infection).  Wear comfortable clothing (specific to your surgery type) to the hospital.  After surgery, you can help prevent lung complications by doing breathing exercises.  Take deep breaths and cough every 1-2 hours. Your doctor may order a device called an  Incentive Spirometer to help you take deep breaths. When coughing or sneezing, hold a pillow firmly against your incision with both hands. This is called "splinting." Doing this helps protect your incision. It also decreases belly discomfort.  If you are being admitted to the hospital overnight, leave your suitcase in the car. After surgery it may be brought to your room.  In case of increased patient census, it may be necessary  for you, the patient, to continue your postoperative care in the Same Day Surgery department.  If you are being discharged the day of surgery, you will not be allowed to drive home. You will need a responsible individual to drive you home and stay with you for 24 hours after surgery.   If you are taking public transportation, you will need to have a responsible individual with you.  Please call the Pre-admissions Testing Dept. at [phone] if you have any questions about these instructions.  Surgery Visitation Policy:  Patients having surgery or a procedure may have two visitors.  Children under the age of 40 must have an adult with them who is not the patient.     Preparing for Surgery with CHLORHEXIDINE  GLUCONATE (CHG) Soap  Chlorhexidine  Gluconate (CHG) Soap  o An antiseptic cleaner that kills germs and bonds with the skin to continue killing germs even after washing  o Used for showering the night before surgery and morning of surgery  Before surgery, you can play an important role by reducing the number of germs on your skin.  CHG (Chlorhexidine  gluconate) soap is an antiseptic cleanser which kills germs and bonds with the skin to continue killing germs even after washing.  Please do not use if you have an allergy to CHG or antibacterial soaps. If your skin becomes reddened/irritated stop using the CHG.  1. Shower the NIGHT BEFORE SURGERY and the MORNING OF SURGERY with CHG soap.  2. If you choose to wash your hair, wash your hair first as usual with your normal shampoo.  3. After shampooing, rinse your hair and body thoroughly to remove the shampoo.  4. Use CHG as you would any other liquid soap. You can apply CHG directly to the skin and wash gently with a scrungie or a clean washcloth.  5. Apply the CHG soap to your body only from the neck down. Do not use on open wounds or open sores. Avoid contact with your eyes, ears, mouth, and genitals (private parts). Wash  face and genitals (private parts) with your normal soap.  6. Wash thoroughly, paying special attention to the area where your surgery will be performed.  7. Thoroughly rinse your body with warm water .  8. Do not shower/wash with your normal soap after using and rinsing off the CHG soap.  9. Pat yourself dry with a clean towel.  10. Wear clean pajamas to bed the night before surgery.  12. Place clean sheets on your bed the night of your first shower and do not sleep with pets.  13. Shower again with the CHG soap on the day of surgery prior to arriving at the hospital.  14. Do not apply any deodorants/lotions/powders.  15. Please wear clean clothes to the hospital.   [employer] to address health-related social needs:  [email or web address]

## 2024-05-10 ENCOUNTER — Other Ambulatory Visit: Payer: Self-pay | Admitting: Cardiovascular Disease

## 2024-05-15 DIAGNOSIS — G5732 Lesion of lateral popliteal nerve, left lower limb: Secondary | ICD-10-CM | POA: Diagnosis not present

## 2024-05-20 ENCOUNTER — Encounter: Payer: Self-pay | Admitting: Neurosurgery

## 2024-05-20 NOTE — Progress Notes (Signed)
 Perioperative / Anesthesia Services  Pre-Admission Testing Clinical Review / Pre-Operative Anesthesia Consult  Date: 05/20/24  PATIENT DEMOGRAPHICS: Name: Alex Maddox DOB: [birth year] MRN:   [id number]  Note: Available PAT nursing documentation and vital signs have been reviewed. Clinical nursing staff has updated patient's PMH/PSHx, current medication list, and drug allergies/intolerances to ensure complete and comprehensive history available to assist care teams in MDM as it pertains to the aforementioned surgical procedure and anticipated anesthetic course. Extensive review of available clinical information personally performed. [place] PMH and PSHx updated with any diagnoses/procedures that  may have been inadvertently omitted during his intake with the pre-admission testing department's nursing staff.  PLANNED SURGICAL PROCEDURE(S):   Case: [id number] Date/Time: 05/21/24 0700   Procedure: PERONEAL NERVE DECOMPRESSION (Left) - OPEN LEFT PERONEAL NERVE DECOMPRESSION   Anesthesia type: Monitor Anesthesia Care   Diagnosis: Neuropathy of left peroneal nerve [G57.32]   Pre-op diagnosis: G57.3 Peroneal neuropathy, left   Location: [facility] OR ROOM 03 / [facility]   Surgeons: Claudene Penne ORN, MD        CLINICAL DISCUSSION: Alex Maddox is a 73 y.o. male who is submitted for pre-surgical anesthesia review and clearance prior to him undergoing the above procedure. Patient has never been a smoker in the past. Pertinent PMH includes: CAD, tachycardia/bradycardia syndrome/SA node dysfunction, PAF, combined systolic/diastolic HF, multiple CVA/TIA, TAA, infrarenal AAA with penetrating ulcer, chronic cerebral microvascular disease, neurocardiogenic syncope, RBBB, aortic atherosclerosis, HTN, HLD, prediabetes, CKD-III, OSAH (unable to tolerate nocturnal PAP therapy), thrombocytopenia, BPH, OA, lumbar DDD, LEFT peroneal neuropathy.   Patient is followed by cardiology Ron,  MD). He was last seen in the cardiology clinic on 01/17/2024; notes reviewed. At the time of his clinic visit, patient doing well overall from a cardiovascular perspective. Patient denied any chest pain, shortness of breath, PND, orthopnea, palpitations, significant peripheral edema, weakness, fatigue, vertiginous symptoms, or presyncope/syncope. Patient with a past medical history significant for cardiovascular diagnoses. Documented physical exam was grossly benign, providing no evidence of acute exacerbation and/or decompensation of the patient's known cardiovascular conditions.  The patient has history of tachybradycardia syndrome in neurally mediated syncope.  He ultimately underwent placement of a implanted Medtronic pacemaker on 05/31/2011.  Device reached ERI on 09/17/2021.  Pulse generator was changed on 12/30/2020 placing a Medtronic Azure XT DR [id number] (SN: [id number]) device.  Device is regularly interrogated by patient's primary electrophysiology team.  Last interrogation was on next 03/28/2024 at which time device was noted to be functioning properly.  Most recent TTE performed on 11/08/2023 revealed a low normal left ventricular systolic function with an EF of 50-55%. There were no regional wall motion abnormalities.  Left ventricular diastolic Doppler parameters were normal.  GLS -11.6%. Right ventricular size and function normal with a TAPSE measuring 2.1 cm  (normal range >/= 1.6 cm).  PASP was normal.  There was mild mitral annular calcification.  There was no significant valvular regurgitation.  All transvalvular gradients were noted to be normal providing no evidence of hemodynamically significant valvular stenosis. Aorta normal in size with no evidence of ectasia or aneurysmal dilatation.  Patient with known aneurysmal dilatation of both the thoracic and abdominal aorta.  Most recent imaging was performed on 02/19/2024.  Thoracic aortic defects measuring 4.2 cm.  Aortic arch mildly dilated  at 3.8 cm.  Infrarenal abdominal aortic aneurysm measuring 4.0 cm was observed.  There was a small penetrating ulcer of the aorta projecting anteriorly measuring 8 x 5 mm.  Patient with a history of multiple CVAs/TIAs in the past.  He does not have any significant neurological deficits following these events.  Patient with an atrial fibrillation diagnosis; CHA2DS2-VASc Score = 6 (age, CHF, HTN, CVA/TIA x 2, vascular disease). His rate and rhythm are currently being maintained on oral dofetilide  + carvedilol . He is chronically anticoagulated using warfarin; reported to be compliant with therapy with no evidence or reports of GI/GU bleeding.  Blood pressure well controlled at 92/70 mmHg on currently prescribed beta-blocker (carvedilol ), diuretic (furosemide ), and ARB (irbesartan ) therapies.  Patient is on rosuvastatin  for his HLD diagnosis and ASCVD prevention.  Patient is prediabetic.  He does have an OSAH diagnosis, however he does not require the use of nocturnal PAP therapy. Patient is able to complete all of his  ADL/IADLs without cardiovascular limitation.  Per the DASI, patient is able to achieve at least 4 METS of physical activity without experiencing any significant degree of angina/anginal equivalent symptoms.  Due to patient's low blood pressure, irbesartan   dose was reduced. Patient scheduled to follow-up with outpatient cardiology in 6 months or sooner if needed.  Alex Maddox is scheduled for an elective PERONEAL NERVE DECOMPRESSION (Left) on 05/21/2024 with Dr. Penne Sharps, MD.  Given patient's past medical history significant for cardiovascular diagnoses, presurgical cardiac clearance was sought by the PAT team. Per cardiology, according to the Revised Cardiac Risk Index (RCRI), his Perioperative Risk of Major Cardiac Event is (%): 11 which is high risk and is due to history of TIA and CHF, however he does not have evidence of volume overload and is maximized on medical therapy. His  Functional Capacity in METs is: 7.59 according to the [employer] Activity Status Index (DASI). The patient is doing well from a cardiac perspective. Therefore, based on ACC/AHA guidelines, the patient would be at ACCEPTABLE risk for the planned procedure without further cardiovascular testing.  Again, this patient is on daily oral anticoagulation therapy. He has been instructed on recommendations for holding his warfarin for 5 days prior to his procedure with plans to restart as soon as postoperative bleeding risk felt to be minimized by his primary attending surgeon. The patient has been instructed that his last dose of should be on 05/15/2024.  Patient denies previous perioperative complications with anesthesia in the past. In review his EMR, it is noted that patient underwent a general anesthetic course at [facility] ([facility]) in 11/2023 without documented complications.   MOST RECENT VITAL SIGNS:    05/08/2024    1:38 PM 04/01/2024    9:27 AM 03/27/2024   10:39 AM  Vitals with BMI  Height 6' 0 6' 0 6' 0  Weight 191 lbs 6 oz 195 lbs 195 lbs  BMI 25.95 26.44 26.44  Systolic 97 118 122  Diastolic 69 78 82  Pulse 60     PROVIDERS/SPECIALISTS: NOTE: Primary physician provider listed below. Patient may have been seen by APP or partner within same practice.   PROVIDER ROLE / SPECIALTY LAST OV  Sharps Penne ORN, MD Neurosurgery (Surgeon) 04/01/2024  Shona Norleen PEDLAR, MD Primary Care Provider ???  Francyne Headland, MD  Cardiology 01/17/2024; preop APP call 04/30/2024  Gaile New, MD Vascular Surgery 03/25/2024  Tobie Breslow, MD Neurology 03/22/2024  Rachele Amour, MD Nephrology 11/15/2022   ALLERGIES: Allergies  Allergen Reactions   Prevnar 13 [Pneumococcal 13-Val Conj Vacc]     Rash and passed out    CURRENT HOME MEDICATIONS: No current facility-administered medications for this encounter.  carvedilol  (COREG ) 25 MG tablet   Cyanocobalamin  (B-12 PO)   diazepam (VALIUM)  2 MG tablet   dofetilide  (TIKOSYN ) 250 MCG capsule   finasteride  (PROSCAR ) 5 MG tablet   furosemide  (LASIX ) 20 MG tablet   gabapentin  (NEURONTIN ) 100 MG capsule   irbesartan  (AVAPRO ) 75 MG tablet   magnesium  gluconate (MAGONATE) 500 (27 Mg) MG TABS tablet   Multiple Vitamin (MULTIVITAMIN WITH MINERALS) TABS tablet   OVER THE COUNTER MEDICATION   Polyethyl Glycol-Propyl Glycol 0.4-0.3 % SOLN   potassium chloride  (KLOR-CON ) 10 MEQ tablet   Saw Palmetto 450 MG CAPS   trospium (SANCTURA) 20 MG tablet   warfarin (COUMADIN ) 5 MG tablet   rosuvastatin  (CRESTOR ) 20 MG tablet   HISTORY: Past Medical History:  Diagnosis Date   Aortic atherosclerosis (HCC)    Arthritis of spine    Ascending aortic aneurysm (HCC)    BPH (benign prostatic hyperplasia)    CAD (coronary artery disease)    Cerebral microvascular disease    CKD (chronic kidney disease), stage III (HCC)    Combined systolic and diastolic congestive heart failure (HCC)    DDD (degenerative disc disease), cervical    a.) s/p C5-C6 ACDF   DDD (degenerative disc disease), lumbar    ED (erectile dysfunction)    History of loop recorder (has been removed)    History of multiple strokes    Hyperlipidemia    Hypertension    Infrarenal abdominal aortic aneurysm (AAA) without rupture (HCC)    Intracranial hemorrhage (HCC)    Neurocardiogenic syncope    last syncopal episode was 01-2024   Paroxysmal atrial fibrillation (HCC)    a.) CHA2DS2VASc = 6 (age, CHF, HTN, CVA/TIA x 2, vascular disease) as of 05/20/2024; b.) rate/rhythm maintained on oral dofetilide  + carvedilol ; chronically anticoagulated with warfarin   Penetrating atherosclerotic ulcer of aorta (HCC)    Peroneal neuropathy, left    Peroneal neuropathy, left    Pre-diabetes    Presence of permanent cardiac pacemaker 05/31/2011   a.) MDT Revo placed 05/31/2011 --> ERI 09/17/2021; b.) PPM generator changed 12/30/2020 --> Medtronic Azure XT DR [id number] (SN: [id number])   RBBB  (right bundle branch block)    Sepsis (HCC)    Sinus node dysfunction (HCC)    Sleep apnea, obstructive    cannot tolerate cpap-sleep study 10/09/2007  AHI 5.73/hr  REM AHI 11.90/hr   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   LEFT THUMB    Tachycardia-bradycardia (HCC)    a.) s/p PPM 05/31/2011   Thrombocytopenia (HCC)    Transient ischemic attack (TIA) 03/2006   Warfarin anticoagulation    Past Surgical History:  Procedure Laterality Date   ANTERIOR CERVICAL DECOMP/DISCECTOMY FUSION     Procedure: ACDF C5-C6   COLONOSCOPY     COLONOSCOPY N/A 06/07/2017   Procedure: COLONOSCOPY;  Surgeon: [name], MD;  [facility];  Service: Endoscopy;  Laterality: N/A;  9:30   COLONOSCOPY N/A 09/20/2018   Procedure: COLONOSCOPY;  Surgeon: [name], MD;  [facility];  Service: Endoscopy;  Laterality: N/A;  1030   COLONOSCOPY WITH PROPOFOL  N/A 11/17/2023   Procedure: COLONOSCOPY WITH PROPOFOL ;  Surgeon: [name], MD;  [facility];  Service: Gastroenterology;  Laterality: N/A;  9:15AM;ASA 3   LOOP RECORDER IMPLANT  02/02/2011   Medtronic- CRM    LOOP RECORDER REMOVAL     PACEMAKER INSERTION  05/30/2011   Medtronic Revo   POLYPECTOMY  09/20/2018  Procedure: POLYPECTOMY;  Surgeon: [name], MD;  [facility];  Service: Endoscopy;;  colon   POLYPECTOMY  11/17/2023   Procedure: POLYPECTOMY;  Surgeon: [name], MD;  [facility];  Service: Gastroenterology;;   [facility] GENERATOR CHANGEOUT N/A 12/30/2020   Procedure: PPM GENERATOR CHANGEOUT;  Surgeon: Francyne Headland, MD;  [facility];  Service: Cardiovascular;  Laterality: N/A;   WISDOM TOOTH EXTRACTION     Family History  Problem Relation Age of Onset   Hypertension Mother    Heart attack Father 43       deceased from heart attack   Hypertension Brother    Colon cancer Neg Hx    Social History   Tobacco Use    Smoking status: Never   Smokeless tobacco: Never   Tobacco comments:    Never smoked 06/28/23  Substance Use Topics   Alcohol  use: Yes    Comment: occasionally wine   LABS:  Appointment on 04/26/2024  Component Date Value Ref Range Status   WBC 04/26/2024 7.4  4.0 - 10.5 K/uL Final   RBC 04/26/2024 4.92  4.22 - 5.81 MIL/uL Final   Hemoglobin 04/26/2024 15.5  13.0 - 17.0 g/dL Final   HCT 92/81/7974 47.7  39.0 - 52.0 % Final   MCV 04/26/2024 97.0  80.0 - 100.0 fL Final   MCH 04/26/2024 31.5  26.0 - 34.0 pg Final   MCHC 04/26/2024 32.5  30.0 - 36.0 g/dL Final   RDW 92/81/7974 12.5  11.5 - 15.5 % Final   Platelets 04/26/2024 105 (L)  150 - 400 K/uL Final   REPEATED TO VERIFY   nRBC 04/26/2024 0.0  0.0 - 0.2 % Final   Neutrophils Relative % 04/26/2024 63  % Final   Neutro Abs 04/26/2024 4.7  1.7 - 7.7 K/uL Final   Lymphocytes Relative 04/26/2024 21  % Final   Lymphs Abs 04/26/2024 1.6  0.7 - 4.0 K/uL Final   Monocytes Relative 04/26/2024 12  % Final   Monocytes Absolute 04/26/2024 0.9  0.1 - 1.0 K/uL Final   Eosinophils Relative 04/26/2024 3  % Final   Eosinophils Absolute 04/26/2024 0.2  0.0 - 0.5 K/uL Final   Basophils Relative 04/26/2024 1  % Final   Basophils Absolute 04/26/2024 0.0  0.0 - 0.1 K/uL Final   Immature Granulocytes 04/26/2024 0  % Final   Abs Immature Granulocytes 04/26/2024 0.02  0.00 - 0.07 K/uL Final   Performed at [facility], [address] [place], [place] [address]   Sodium 04/26/2024 140  135 - 145 mmol/L Final   Potassium 04/26/2024 3.9  3.5 - 5.1 mmol/L Final   Chloride 04/26/2024 102  98 - 111 mmol/L Final   CO2 04/26/2024 26  22 - 32 mmol/L Final   Glucose, Bld 04/26/2024 73  70 - 99 mg/dL Final   Glucose reference range applies only to samples taken after fasting for at least 8 hours.   BUN 04/26/2024 28 (H)  8 - 23 mg/dL Final   Creatinine, Ser 04/26/2024 1.30 (H)  0.61 - 1.24 mg/dL Final   Calcium  04/26/2024 9.9  8.9 - 10.3 mg/dL Final    Total Protein 04/26/2024 6.9  6.5 - 8.1 g/dL Final   Albumin 92/81/7974 3.9  3.5 - 5.0 g/dL Final   AST 92/81/7974 25  15 - 41 U/L Final   ALT 04/26/2024 36  0 - 44 U/L Final   Alkaline Phosphatase 04/26/2024 53  38 -  126 U/L Final   Total Bilirubin 04/26/2024 1.0  0.0 - 1.2 mg/dL Final   GFR, Estimated 04/26/2024 59 (L)  >60 mL/min Final   Comment: (NOTE) Calculated using the CKD-EPI Creatinine Equation (2021)    Anion gap 04/26/2024 12  5 - 15 Final   Performed at [facility], [address], [place], [place] [address]    ECG: Date: 01/17/2024  Time ECG obtained: 0852 AM Rate: 60 bpm Rhythm: atrial paced rhythm; RBBB Axis (leads I and aVF): normal Intervals: PR 198 ms. QRS 136 ms. QTc 492 ms. ST segment and T wave changes: No evidence of acute T wave abnormalities or significant ST segment elevation or depression.  Evidence of a possible, age undetermined, prior infarct:  No Comparison: Similar to previous tracing obtained on 12/27/2023   IMAGING / PROCEDURES: MR KNEE LEFT W WO CONTRAST performed on 04/23/2024 Normal appearing peroneal nerve. Negative for cyst or other lesion impinging on the nerve. Small undersurface tear at the junction of the posterior horn and body of the medial meniscus. Mild medial compartment osteoarthritis. Subcutaneous varicosities about the knee are most notable on the medial side. Small volume of fluid anterior to the patella and patellar tendon may be due to bursitis.  MR LUMBAR SPINE WO CONTRAST performed on 02/22/2024 No acute findings or clear explanation for the patient's symptoms. Mildly increased disc bulging and facet hypertrophy at L2-3 with mild narrowing of the left lateral recess. Stable mild lateral recess and foraminal narrowing bilaterally at L4-5. Mildly increased right foraminal narrowing at L5-S1 without definite nerve root encroachment. Known abdominal aortic aneurysm appears similar to recent CTA.  CT ANGIO CHEST/ABD/PEL FOR  DISSECTION W AND/OR W/WO performed on 02/19/2024 Interval increase in size of an L4 0.0 cm infrarenal abdominal aortic aneurysm. Recommend follow-up in 12 months and referral to a vascular specialist. There is now a 8 mm penetrating ulcer of the infrarenal aorta below the abdominal aortic aneurysm sac. Again follow up with a vascular specialist is recommended. Stable dilatation of the ascending thoracic aorta measuring 4.2 cm.   TRANSTHORACIC ECHOCARDIOGRAM performed on 11/08/2023 Left ventricular ejection fraction, by estimation, is 50 to 55%. The left ventricle has low normal function. The left ventricle has no regional wall motion abnormalities. Left ventricular diastolic parameters were normal. The average left ventricular global longitudinal strain is -11.6 %. The global longitudinal strain is abnormal. Right ventricular systolic function is normal. The right ventricular size is normal. There is normal pulmonary artery systolic pressure.  The mitral valve is degenerative. No evidence of mitral valve regurgitation. No evidence of mitral stenosis.  The aortic valve is tricuspid. Aortic valve regurgitation is trivial. Aortic valve sclerosis is present, with no evidence of aortic valve  stenosis.  Aortic dilatation noted. There is dilatation of the ascending aorta, measuring 41 mm.  The inferior vena cava is normal in size with greater than 50% respiratory variability, suggesting right atrial pressure of 3 mmHg.     IMPRESSION AND PLAN: Alex Maddox has been referred for pre-anesthesia review and clearance prior to him undergoing the planned anesthetic and procedural courses. Available labs, pertinent testing, and imaging results were personally reviewed by me in preparation for upcoming operative/procedural course. [facility] medical record has been updated following extensive record review and patient interview with PAT staff.   This patient has been appropriately cleared by cardiology with an  overall ACCEPTABLE risk of patient experiencing significant perioperative cardiovascular complications. Completed perioperative prescription for cardiac device management documentation completed  by primary cardiology team and placed on patient's chart for review by the surgical/anesthetic team on the day of his procedure. Electrophysiology indicating that procedure should not interfere with the planned surgical procedure. Beyond normal perioperative cardiovascular monitoring, there are no recommendations from his electrophysiology team that would prompt further discussions/recommendations from an industry representative.   Based on clinical review performed today (05/20/24), barring any significant acute changes in the patient's overall condition, it is anticipated that he will be able to proceed with the planned surgical intervention. Any acute changes in clinical condition may necessitate his procedure being postponed and/or cancelled. Patient will meet with anesthesia team (MD and/or CRNA) on the day of his procedure for preoperative evaluation/assessment. Questions regarding anesthetic course will be fielded at that time.   Pre-surgical instructions were reviewed with the patient during his PAT appointment, and questions were fielded to satisfaction by PAT clinical staff. He has been instructed on which medications that he will need to hold prior to surgery, as well as the ones that have been deemed safe/appropriate to take on the day of his procedure. As part of the general education provided by PAT, patient made aware both verbally and in writing, that he would need to abstain from the use of any illegal substances during his perioperative course. He was advised that failure to follow the provided instructions could necessitate case cancellation or result in serious perioperative complications up to and including death. Patient encouraged to contact PAT and/or his surgeon's office to discuss any questions  or concerns that may arise prior to surgery; verbalized understanding.   Dorise Pereyra, MSN, APRN, FNP-C, CEN [facility]  Perioperative Services Nurse Practitioner Phone: [phone] Fax: [phone] 05/20/24 8:21 PM  NOTE: This note has been prepared using Dragon dictation software. Despite my best ability to proofread, there is always the potential that unintentional transcriptional errors may still occur from this process.

## 2024-05-21 ENCOUNTER — Ambulatory Visit: Payer: Self-pay | Admitting: Urgent Care

## 2024-05-21 ENCOUNTER — Encounter: Payer: Self-pay | Admitting: Neurosurgery

## 2024-05-21 ENCOUNTER — Encounter
Admission: RE | Disposition: A | Discharge status: Home / Self Care | Payer: Self-pay | Source: Ambulatory Visit | Attending: Neurosurgery

## 2024-05-21 ENCOUNTER — Other Ambulatory Visit: Payer: Self-pay

## 2024-05-21 ENCOUNTER — Ambulatory Visit
Admission: RE | Admit: 2024-05-21 | Discharge: 2024-05-21 | Disposition: A | Discharge status: Home / Self Care | Source: Ambulatory Visit | Attending: Neurosurgery | Admitting: Neurosurgery

## 2024-05-21 DIAGNOSIS — I4510 Unspecified right bundle-branch block: Secondary | ICD-10-CM | POA: Diagnosis not present

## 2024-05-21 DIAGNOSIS — Z87891 Personal history of nicotine dependence: Secondary | ICD-10-CM | POA: Insufficient documentation

## 2024-05-21 DIAGNOSIS — R7303 Prediabetes: Secondary | ICD-10-CM | POA: Diagnosis not present

## 2024-05-21 DIAGNOSIS — I5042 Chronic combined systolic (congestive) and diastolic (congestive) heart failure: Secondary | ICD-10-CM | POA: Diagnosis not present

## 2024-05-21 DIAGNOSIS — Z950 Presence of cardiac pacemaker: Secondary | ICD-10-CM | POA: Diagnosis not present

## 2024-05-21 DIAGNOSIS — I4891 Unspecified atrial fibrillation: Secondary | ICD-10-CM | POA: Diagnosis not present

## 2024-05-21 DIAGNOSIS — N183 Chronic kidney disease, stage 3 unspecified: Secondary | ICD-10-CM | POA: Diagnosis not present

## 2024-05-21 DIAGNOSIS — G4733 Obstructive sleep apnea (adult) (pediatric): Secondary | ICD-10-CM | POA: Insufficient documentation

## 2024-05-21 DIAGNOSIS — Z7901 Long term (current) use of anticoagulants: Secondary | ICD-10-CM | POA: Diagnosis not present

## 2024-05-21 DIAGNOSIS — I130 Hypertensive heart and chronic kidney disease with heart failure and stage 1 through stage 4 chronic kidney disease, or unspecified chronic kidney disease: Secondary | ICD-10-CM | POA: Diagnosis not present

## 2024-05-21 DIAGNOSIS — I7143 Infrarenal abdominal aortic aneurysm, without rupture: Secondary | ICD-10-CM | POA: Insufficient documentation

## 2024-05-21 DIAGNOSIS — I700 Atherosclerosis of aorta: Secondary | ICD-10-CM | POA: Insufficient documentation

## 2024-05-21 DIAGNOSIS — I2510 Atherosclerotic heart disease of native coronary artery without angina pectoris: Secondary | ICD-10-CM | POA: Insufficient documentation

## 2024-05-21 DIAGNOSIS — Z01818 Encounter for other preprocedural examination: Secondary | ICD-10-CM

## 2024-05-21 DIAGNOSIS — G5762 Lesion of plantar nerve, left lower limb: Secondary | ICD-10-CM | POA: Diagnosis not present

## 2024-05-21 DIAGNOSIS — G5732 Lesion of lateral popliteal nerve, left lower limb: Secondary | ICD-10-CM | POA: Diagnosis not present

## 2024-05-21 DIAGNOSIS — I480 Paroxysmal atrial fibrillation: Secondary | ICD-10-CM | POA: Diagnosis not present

## 2024-05-21 HISTORY — DX: Atherosclerosis of aorta: I70.0

## 2024-05-21 HISTORY — DX: Atherosclerotic heart disease of native coronary artery without angina pectoris: I25.10

## 2024-05-21 HISTORY — DX: Other cervical disc degeneration, unspecified cervical region: M50.30

## 2024-05-21 HISTORY — DX: Benign prostatic hyperplasia without lower urinary tract symptoms: N40.0

## 2024-05-21 HISTORY — DX: Unspecified right bundle-branch block: I45.10

## 2024-05-21 HISTORY — DX: Chronic kidney disease, stage 3 unspecified: N18.30

## 2024-05-21 HISTORY — PX: PERONEAL NERVE DECOMPRESSION: SHX2226

## 2024-05-21 HISTORY — DX: Unspecified combined systolic (congestive) and diastolic (congestive) heart failure: I50.40

## 2024-05-21 HISTORY — DX: Infrarenal abdominal aortic aneurysm, without rupture: I71.43

## 2024-05-21 HISTORY — DX: Aortic aneurysm of unspecified site, without rupture: I71.9

## 2024-05-21 HISTORY — DX: Other cerebrovascular disease: I67.89

## 2024-05-21 HISTORY — DX: Spondylosis without myelopathy or radiculopathy, site unspecified: M47.819

## 2024-05-21 HISTORY — DX: Other intervertebral disc degeneration, lumbar region without mention of lumbar back pain or lower extremity pain: M51.369

## 2024-05-21 LAB — PROTIME-INR
INR: 1.3 — ABNORMAL HIGH (ref 0.8–1.2)
Prothrombin Time: 17 s — ABNORMAL HIGH (ref 11.4–15.2)

## 2024-05-21 SURGERY — PERONEAL NERVE DECOMPRESSION
Anesthesia: General | Site: Leg Lower | Laterality: Left

## 2024-05-21 MED ORDER — MIDAZOLAM HCL 2 MG/2ML IJ SOLN
INTRAMUSCULAR | Status: DC | PRN
  Administered 2024-05-21 (×2): 2 mg via INTRAVENOUS

## 2024-05-21 MED ORDER — METHYLPREDNISOLONE ACETATE 40 MG/ML IJ SUSP
INTRAMUSCULAR | Status: AC
  Filled 2024-05-21: qty 1

## 2024-05-21 MED ORDER — CEFAZOLIN SODIUM-DEXTROSE 2-4 GM/100ML-% IV SOLN
2.0000 g | INTRAVENOUS | Status: AC
  Administered 2024-05-21 (×2): 2 g via INTRAVENOUS

## 2024-05-21 MED ORDER — LIDOCAINE HCL (PF) 2 % IJ SOLN
INTRAMUSCULAR | Status: AC
  Filled 2024-05-21: qty 5

## 2024-05-21 MED ORDER — LIDOCAINE HCL (CARDIAC) PF 100 MG/5ML IV SOSY
PREFILLED_SYRINGE | INTRAVENOUS | Status: DC | PRN
  Administered 2024-05-21 (×2): 40 mg via INTRAVENOUS

## 2024-05-21 MED ORDER — GABAPENTIN 100 MG PO CAPS
100.0000 mg | ORAL_CAPSULE | Freq: Two times a day (BID) | ORAL | 1 refills | Status: DC
  Filled 2024-05-21: qty 60, 30d supply, fill #0

## 2024-05-21 MED ORDER — PROPOFOL 10 MG/ML IV BOLUS
INTRAVENOUS | Status: AC
  Filled 2024-05-21: qty 20

## 2024-05-21 MED ORDER — CHLORHEXIDINE GLUCONATE 0.12 % MT SOLN
OROMUCOSAL | Status: AC
  Filled 2024-05-21: qty 15

## 2024-05-21 MED ORDER — ORAL CARE MOUTH RINSE: 15.0000 mL | Freq: Once | OROMUCOSAL | Status: AC

## 2024-05-21 MED ORDER — CEFAZOLIN SODIUM-DEXTROSE 2-4 GM/100ML-% IV SOLN
INTRAVENOUS | Status: AC
  Filled 2024-05-21: qty 100

## 2024-05-21 MED ORDER — 0.9 % SODIUM CHLORIDE (POUR BTL) OPTIME
TOPICAL | Status: DC | PRN
  Administered 2024-05-21 (×2): 500 mL

## 2024-05-21 MED ORDER — TRAMADOL HCL 50 MG PO TABS
50.0000 mg | ORAL_TABLET | Freq: Four times a day (QID) | ORAL | 0 refills | Status: DC | PRN
  Filled 2024-05-21: qty 28, 7d supply, fill #0

## 2024-05-21 MED ORDER — CHLORHEXIDINE GLUCONATE 0.12 % MT SOLN
15.0000 mL | Freq: Once | OROMUCOSAL | Status: AC
  Administered 2024-05-21 (×2): 15 mL via OROMUCOSAL

## 2024-05-21 MED ORDER — BUPIVACAINE-EPINEPHRINE (PF) 0.5% -1:200000 IJ SOLN
INTRAMUSCULAR | Status: DC | PRN
  Administered 2024-05-21 (×2): 4 mL via PERINEURAL

## 2024-05-21 MED ORDER — PROPOFOL 500 MG/50ML IV EMUL
INTRAVENOUS | Status: DC | PRN
  Administered 2024-05-21 (×2): 100 ug/kg/min via INTRAVENOUS

## 2024-05-21 MED ORDER — OXYCODONE HCL 5 MG/5ML PO SOLN: 5.0000 mg | Freq: Once | ORAL | Status: DC | PRN

## 2024-05-21 MED ORDER — OXYCODONE HCL 5 MG PO TABS: 5.0000 mg | ORAL_TABLET | Freq: Once | ORAL | Status: DC | PRN

## 2024-05-21 MED ORDER — LACTATED RINGERS IV SOLN: INTRAVENOUS | Status: DC

## 2024-05-21 MED ORDER — MIDAZOLAM HCL 2 MG/2ML IJ SOLN
INTRAMUSCULAR | Status: AC
  Filled 2024-05-21: qty 2

## 2024-05-21 MED ORDER — FENTANYL CITRATE (PF) 100 MCG/2ML IJ SOLN: 25.0000 ug | INTRAMUSCULAR | Status: DC | PRN

## 2024-05-21 MED ORDER — CEFAZOLIN IN SODIUM CHLORIDE 2-0.9 GM/100ML-% IV SOLN
2.0000 g | Freq: Once | INTRAVENOUS | Status: DC
  Filled 2024-05-21: qty 100

## 2024-05-21 MED ORDER — BUPIVACAINE-EPINEPHRINE (PF) 0.5% -1:200000 IJ SOLN
INTRAMUSCULAR | Status: AC
  Filled 2024-05-21: qty 10

## 2024-05-21 SURGICAL SUPPLY — 27 items
BRUSH SCRUB EZ 4% CHG (MISCELLANEOUS) ×1 IMPLANT
CHLORAPREP W/TINT 26 (MISCELLANEOUS) ×1 IMPLANT
CORD BIP STRL DISP 12FT (MISCELLANEOUS) ×1 IMPLANT
COVER PROBE FLX POLY STRL (MISCELLANEOUS) IMPLANT
DERMABOND ADVANCED .7 DNX12 (GAUZE/BANDAGES/DRESSINGS) ×1 IMPLANT
DRAPE INCISE IOBAN 66X45 STRL (DRAPES) ×1 IMPLANT
DRAPE LAPAROTOMY 77X122 PED (DRAPES) ×1 IMPLANT
DRAPE SURG 17X11 SM STRL (DRAPES) ×2 IMPLANT
DRSG OPSITE POSTOP 3X4 (GAUZE/BANDAGES/DRESSINGS) ×1 IMPLANT
FORCEPS JEWEL BIP 4-3/4 STR (INSTRUMENTS) ×1 IMPLANT
GAUZE SPONGE 4X4 12PLY STRL (GAUZE/BANDAGES/DRESSINGS) ×1 IMPLANT
GAUZE XEROFORM 1X8 LF (GAUZE/BANDAGES/DRESSINGS) ×1 IMPLANT
GLOVE BIOGEL PI IND STRL 8 (GLOVE) ×2 IMPLANT
GLOVE SURG SYN 7.5 PF PI (GLOVE) ×1 IMPLANT
GOWN STRL REUS W/ TWL LRG LVL3 (GOWN DISPOSABLE) ×1 IMPLANT
GOWN STRL REUS W/ TWL XL LVL3 (GOWN DISPOSABLE) ×1 IMPLANT
KIT TURNOVER KIT A (KITS) ×1 IMPLANT
MANIFOLD NEPTUNE II (INSTRUMENTS) ×1 IMPLANT
NDL HYPO 25X1 1.5 SAFETY (NEEDLE) ×1 IMPLANT
NEEDLE HYPO 25X1 1.5 SAFETY (NEEDLE) ×1 IMPLANT
NS IRRIG 500ML POUR BTL (IV SOLUTION) ×1 IMPLANT
PACK BASIN MINOR ARMC (MISCELLANEOUS) ×1 IMPLANT
SUT STRATA 3-0 15 PS-2 (SUTURE) IMPLANT
SUT VIC AB 2-0 SH 27XBRD (SUTURE) ×1 IMPLANT
SUT VIC AB 3-0 SH 27X BRD (SUTURE) ×1 IMPLANT
TRAP FLUID SMOKE EVACUATOR (MISCELLANEOUS) ×1 IMPLANT
WATER STERILE IRR 500ML POUR (IV SOLUTION) ×1 IMPLANT

## 2024-05-21 NOTE — Op Note (Signed)
 Indications: Patient was found to have a peroneal neuropathy on clinical exam and with electrodiagnostic and ultrasound testing.  Given their persistent symptoms in the face of conservative management, they were taken to the OR for peroneal nerve decompression.  Findings: Severe compression and flattening of the peroneal nerve at the posterior crural ligament   Preoperative Diagnosis: Peroneal neuropathy    Postoperative Diagnosis: same     EBL: Minimal  IVF: See anesthesia report Drains: none Disposition:Stable to PACU Complications: none   No foley catheter was placed.     Preoperative Note: The patient was seen again in the preoperative area.  They continue to have symptoms of peroneal neuropathy that were causing a significant impact on their lives.  They did not have any improvement with conservative management therefore we planned for a peroneal nerve decompression.  Risk of surgery is discussed and include: Infection, bleeding, wound healing issues, nerve injury, pain, failure to relieve the symptoms, need for further surgery.   Procedure:  1) left sided common peroneal nerve decompression at the fibular neck   Procedure: After obtaining informed consent, the patient taken to the operating room, placed in supine position, monitored anesthesia care was induced.  They were given preoperative antibiotics.  Prepped and draped in the usual fashion.  Comprehensive timeout was performed verifying the patient's name, MRN, planned procedure.   Patient was induced and monitored anesthesia care.  They were supine on the bed.  Her hip was padded. Down leg was point padded.  They were then prepped and draped in the usual fashion.  The nerve was clearly palpable inferior to the head of the fibula, preoperative ultrasound was utilized to evaluate the peroneal nerve as it crossed over the fibular neck and under the lateral compartment musculature.  The skin was infiltrated with local anesthetic.   The skin was opened sharply.  This brought us  down to the investing fascia.  Investing fascia was opened sharply with care taken to spare any crossing nerves or vessels.  We then followed the investing fascia deep to the lateral compartment.  We were able to identify the fascia over top of the lateral compartment.  This was opened sharply.  It was followed back to the posterior crural ligament.  Once the posterior crural ligament was identified it was isolated.  It appeared to be quite hypertrophied and compressive of the peroneal nerve at the fibular neck.  This was divided and resulted in significant decompression of the peroneal nerve.  We then followed the nerve proximally there is a few bands of proximal compression noted and the nerve was quite enlarged.  We followed this back to the popliteal fossa.  We then followed the distally and released any further fascial compression bands at the fibular neck.  The wound was copiously irrigatedl.  The wound was then closed in multiple layers with skin glue on the most superficial aspect.  No immediate complications.   Sponge and pattie counts were correct at the end of the procedure.    I performed this procedure with the assistance of Edsel Goods PA-C they helped with tissue retraction, exposure, protection of the nerve, closure.  I performed the critical portions of the procedure.   Penne MICAEL Sharps, MD/MSCR

## 2024-05-21 NOTE — Discharge Summary (Signed)
 Discharge Summary  Patient ID: SAIFULLAH JOLLEY MRN: [id number] DOB/AGE: June 13, 1951 73 y.o.  Admit date: 05/21/2024 Discharge date: 05/21/2024  Admission Diagnoses: Peroneal Neuropathy  Discharge Diagnoses:  Principal Problem:   Peroneal neuropathy, left   Discharged Condition: good  Hospital Course:  Normal Steelman is a 73 y.o presenting with left common peroneal nerve compression and neuropathy s/p decompression. His intraoperative course was uncomplicated. He was monitored in PACU and discharged home after meeting PACU discharge standards. He was given prescriptions for Tramadol  to take as needed for pain  Consults: None  Significant Diagnostic Studies: NA  Treatments: surgery: as above. Please see separately dictated operative report for further details  Discharge Exam: Blood pressure 117/73, pulse (!) 59, temperature (!) 97.5 F (36.4 C), resp. rate 11, SpO2 97%. CN grossly intact  Strength: Side Iliopsoas Quads Hamstring PF DF EHL EDC inv ev  R 5 5 5 5 5 5 5 5 5   L 5 5 5 5  4- 3 2 5  4-   Incision c/d/I with dermabond and clean post-op dressing in place  Disposition: Discharge disposition: 01-Home or Self Care        Allergies as of 05/21/2024       Reactions   Prevnar 13 [pneumococcal 13-val Conj Vacc]    Rash and passed out        Medication List     PAUSE taking these medications    warfarin 5 MG tablet Wait to take this until your doctor or other care provider tells you to start again. Commonly known as: COUMADIN  Take as directed. If you are unsure how to take this medication, talk to your nurse or doctor. Original instructions: TAKE ONE-HALF TO ONE TABLET BY MOUTH EVERY DAY AS DIRECTED BY COUMADIN  CLINIC What changed: See the new instructions.       TAKE these medications    B-12 PO Take 1 tablet by mouth daily.   carvedilol  25 MG tablet Commonly known as: COREG  Take 2 tablets (50 mg total) by mouth 2 (two) times daily with a meal.    diazepam 2 MG tablet Commonly known as: VALIUM Take 2 mg by mouth every 8 (eight) hours as needed for anxiety.   dofetilide  250 MCG capsule Commonly known as: TIKOSYN  TAKE ONE (1) CAPSULE BY MOUTH TWICE A DAY. (EVERY 12 HOURS.)   finasteride  5 MG tablet Commonly known as: PROSCAR  Take 5 mg by mouth every morning.   furosemide  20 MG tablet Commonly known as: LASIX  Take 1 tablet (20 mg total) by mouth daily as needed for fluid or edema (20 MG daily as needed for swelling- take potassium when you take this). What changed: when to take this   gabapentin  100 MG capsule Commonly known as: NEURONTIN  Take 100 mg by mouth 2 (two) times daily.   irbesartan  75 MG tablet Commonly known as: AVAPRO  Take 1 tablet (75 mg total) by mouth daily. What changed: when to take this   magnesium  gluconate 500 (27 Mg) MG Tabs tablet Commonly known as: MAGONATE Take 500 mg by mouth daily.   multivitamin with minerals Tabs tablet Take 1 tablet by mouth daily. Adult Multivitamin 50+   OVER THE COUNTER MEDICATION Take 1 tablet by mouth at bedtime. Equate Brain Health Supplement   Polyethyl Glycol-Propyl Glycol 0.4-0.3 % Soln Place 1 drop into both eyes 3 (three) times daily as needed (for dry eyes.).   potassium chloride  10 MEQ tablet Commonly known as: KLOR-CON  Take 2 tablets (20 mEq total) by mouth  daily as needed (take 2 tablets daily when you take Fursosemide). What changed:  how much to take reasons to take this   rosuvastatin  20 MG tablet Commonly known as: CRESTOR  TAKE ONE TABLET (20MG  TOTAL) BY MOUTH DAILY   Saw Palmetto 450 MG Caps Take 900 mg by mouth daily.   trospium 20 MG tablet Commonly known as: SANCTURA Take 20 mg by mouth at bedtime.        Follow-up Information     [facility]. Go on 06/04/2024.   Why: Lyle Companion PA-C  @ 3:00 PM. Contact information: [address] [place]  [phone]                 Signed: Edsel Jama Goods 05/21/2024, 8:39 AM

## 2024-05-21 NOTE — Transfer of Care (Signed)
 Immediate Anesthesia Transfer of Care Note  Patient: Alex Maddox  Procedure(s) Performed: PERONEAL NERVE DECOMPRESSION (Left: Leg Lower)  Patient Location: PACU  Anesthesia Type:General  Level of Consciousness: awake, alert , and oriented  Airway & Oxygen Therapy: Patient Spontanous Breathing and Patient connected to face mask oxygen  Post-op Assessment: Report given to RN and Post -op Vital signs reviewed and stable  Post vital signs: Reviewed and stable  Last Vitals:  Vitals Value Taken Time  BP 107/69 0755  Temp 35.8 0755  Pulse 60 0755  Resp 17 0755  SpO2 98 0755    Last Pain:  Vitals:   05/21/24 0621  TempSrc: Temporal  PainSc: 0-No pain         Complications: No notable events documented.

## 2024-05-21 NOTE — Anesthesia Postprocedure Evaluation (Signed)
 Anesthesia Post Note  Patient: Alex Maddox  Procedure(s) Performed: PERONEAL NERVE DECOMPRESSION (Left: Leg Lower)  Patient location during evaluation: PACU Anesthesia Type: General Level of consciousness: awake and alert Pain management: pain level controlled Vital Signs Assessment: post-procedure vital signs reviewed and stable Respiratory status: spontaneous breathing, nonlabored ventilation, respiratory function stable and patient connected to nasal cannula oxygen Cardiovascular status: blood pressure returned to baseline and stable Postop Assessment: no apparent nausea or vomiting Anesthetic complications: no   No notable events documented.   Last Vitals:  Vitals:   05/21/24 0815 05/21/24 0840  BP: 117/73 (!) 140/88  Pulse: (!) 59 (!) 59  Resp: 11 18  Temp:  (!) 36.2 C  SpO2: 97% 100%    Last Pain:  Vitals:   05/21/24 0840  TempSrc: Temporal  PainSc: 0-No pain                 [name]

## 2024-05-21 NOTE — Anesthesia Preprocedure Evaluation (Addendum)
 Anesthesia Evaluation  Patient identified by MRN, date of birth, ID band Patient awake    Reviewed: Allergy & Precautions, NPO status , Patient's Chart, lab work & pertinent test results  History of Anesthesia Complications Negative for: history of anesthetic complications  Airway Mallampati: III  TM Distance: >3 FB Neck ROM: full    Dental no notable dental hx.    Pulmonary sleep apnea    Pulmonary exam normal        Cardiovascular hypertension, + CAD, + Peripheral Vascular Disease and +CHF  + dysrhythmias (RBBB) Atrial Fibrillation + pacemaker   ECHO  IMPRESSIONS     1. Left ventricular ejection fraction, by estimation, is 50 to 55%. The  left ventricle has low normal function. The left ventricle has no regional  wall motion abnormalities. Left ventricular diastolic parameters were  normal. The average left ventricular   global longitudinal strain is -11.6 %. The global longitudinal strain is  abnormal.   2. Right ventricular systolic function is normal. The right ventricular  size is normal. There is normal pulmonary artery systolic pressure.   3. The mitral valve is degenerative. No evidence of mitral valve  regurgitation. No evidence of mitral stenosis.   4. The aortic valve is tricuspid. Aortic valve regurgitation is trivial.  Aortic valve sclerosis is present, with no evidence of aortic valve  stenosis.   5. Aortic dilatation noted. There is dilatation of the ascending aorta,  measuring 41 mm.   6. The inferior vena cava is normal in size with greater than 50%  respiratory variability, suggesting right atrial pressure of 3 mmHg.     Neuro/Psych TIA Neuromuscular disease CVA, No Residual Symptoms  negative psych ROS   GI/Hepatic negative GI ROS, Neg liver ROS,,,  Endo/Other  negative endocrine ROS    Renal/GU CRFRenal disease  negative genitourinary   Musculoskeletal   Abdominal   Peds   Hematology negative hematology ROS (+) INR 1.3, surgeon aware    Anesthesia Other Findings Past Medical History: No date: Aortic atherosclerosis (HCC) No date: Arthritis of spine No date: Ascending aortic aneurysm (HCC) No date: BPH (benign prostatic hyperplasia) No date: CAD (coronary artery disease) No date: Cerebral microvascular disease No date: CKD (chronic kidney disease), stage III (HCC) No date: Combined systolic and diastolic congestive heart failure  (HCC) No date: DDD (degenerative disc disease), cervical     Comment:  a.) s/p C5-C6 ACDF No date: DDD (degenerative disc disease), lumbar No date: ED (erectile dysfunction) No date: History of loop recorder (has been removed) No date: History of multiple strokes No date: Hyperlipidemia No date: Hypertension No date: Infrarenal abdominal aortic aneurysm (AAA) without rupture  (HCC) No date: Intracranial hemorrhage (HCC) No date: Neurocardiogenic syncope     Comment:  last syncopal episode was 01-2024 No date: Paroxysmal atrial fibrillation (HCC)     Comment:  a.) CHA2DS2VASc = 6 (age, CHF, HTN, CVA/TIA x 2,               vascular disease) as of 05/20/2024; b.) rate/rhythm               maintained on oral dofetilide  + carvedilol ; chronically               anticoagulated with warfarin No date: Penetrating atherosclerotic ulcer of aorta (HCC) No date: Peroneal neuropathy, left No date: Peroneal neuropathy, left No date: Pre-diabetes 05/31/2011: Presence of permanent cardiac pacemaker     Comment:  a.) MDT Revo placed 05/31/2011 --> ERI  09/17/2021; b.)               PPM generator changed 12/30/2020 --> Medtronic Azure XT               DR [id number] (SN: [id number]) No date: RBBB (right bundle branch block) No date: Sepsis (HCC) No date: Sinus node dysfunction (HCC) No date: Sleep apnea, obstructive     Comment:  cannot tolerate cpap-sleep study 10/09/2007  AHI 5.73/hr              REM AHI 11.90/hr 03/2014: Squamous cell  cancer of skin of finger     Comment:  LEFT THUMB  No date: Tachycardia-bradycardia [facility])     Comment:  a.) s/p PPM 05/31/2011 No date: Thrombocytopenia (HCC) 03/2006: Transient ischemic attack (TIA) No date: Warfarin anticoagulation  Past Surgical History: No date: ANTERIOR CERVICAL DECOMP/DISCECTOMY FUSION     Comment:  Procedure: ACDF C5-C6 No date: COLONOSCOPY 06/07/2017: COLONOSCOPY; N/A     Comment:  Procedure: COLONOSCOPY;  Surgeon: [name], MD;               Location: [facility];  Service: Endoscopy;                Laterality: N/A;  9:30 09/20/2018: COLONOSCOPY; N/A     Comment:  Procedure: COLONOSCOPY;  Surgeon: [name], MD;               Location: [facility];  Service: Endoscopy;                Laterality: N/A;  1030 11/17/2023: COLONOSCOPY WITH PROPOFOL ; N/A     Comment:  Procedure: COLONOSCOPY WITH PROPOFOL ;  Surgeon:               Eartha Flavors, Toribio, MD;  Location: [facility];               Service: Gastroenterology;  Laterality: N/A;  9:15AM;ASA               3 02/02/2011: LOOP RECORDER IMPLANT     Comment:  Medtronic- CRM  No date: LOOP RECORDER REMOVAL 05/30/2011: PACEMAKER INSERTION     Comment:  Medtronic Revo 09/20/2018: POLYPECTOMY     Comment:  Procedure: POLYPECTOMY;  Surgeon: [name], MD;               Location: [facility];  Service: Endoscopy;;  colon 11/17/2023: POLYPECTOMY     Comment:  Procedure: POLYPECTOMY;  Surgeon: Eartha Flavors Toribio, MD;  Location: [facility];  Service:               Gastroenterology;; 12/30/2020: PPM GENERATOR CHANGEOUT; N/A     Comment:  Procedure: PPM GENERATOR CHANGEOUT;  Surgeon: Francyne Headland, MD;  Location: [facility];  Service:               Cardiovascular;  Laterality: N/A; No date: WISDOM TOOTH EXTRACTION     Reproductive/Obstetrics negative OB ROS                              Anesthesia  Physical Anesthesia Plan  ASA: 3  Anesthesia Plan: General   Post-op Pain Management: Toradol  IV (intra-op)* and Ofirmev  IV (intra-op)*   Induction:  Intravenous  PONV Risk Score and Plan: 2 and Propofol  infusion and TIVA  Airway Management Planned: Natural Airway and Nasal Cannula  Additional Equipment:   Intra-op Plan:   Post-operative Plan:   Informed Consent: I have reviewed the patients History and Physical, chart, labs and discussed the procedure including the risks, benefits and alternatives for the proposed anesthesia with the patient or authorized representative who has indicated his/her understanding and acceptance.     Dental Advisory Given  Plan Discussed with: Anesthesiologist, CRNA and Surgeon  Anesthesia Plan Comments: (Patient consented for risks of anesthesia including but not limited to:  - adverse reactions to medications - risk of airway placement if required - damage to eyes, teeth, lips or other oral mucosa - nerve damage due to positioning  - sore throat or hoarseness - Damage to heart, brain, nerves, lungs, other parts of body or loss of life  Patient voiced understanding and assent.)         Anesthesia Quick Evaluation

## 2024-05-21 NOTE — H&P (Signed)
 Referring Physician:  Claudene Maddox ORN, MD [address] [place],  [place] [address]  Primary Physician:  Alex Norleen PEDLAR, MD  History of Present Illness: 05/21/2024 [name] has a history of afib, HTN, AAA, heart failure, OSA, hyperlipidemia, TIA.  He has a pacemaker.  He has been having numbness and weakness in his left lower extremity.  He has been worked up by [facility] as well as Dr. 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  He states that this continues to get worse.  He does not smoke.   Bowel/Bladder Dysfunction: he has urinary urgency. No bowel issues. No perineal numbness.   Conservative measures:  Physical therapy: has not participated in PT Multimodal medical therapy including regular antiinflammatories:  gabapentin  Injections:  no epidural steroid injections  Past Surgery: no spinal surgeries  The symptoms are causing a significant impact on the patient's life.   Review of Systems:  A 10 point review of systems is negative, except for the pertinent positives and negatives detailed in the HPI.  Past Medical History: Past Medical History:  Diagnosis Date   Aortic atherosclerosis (HCC)    Arthritis of spine    Ascending aortic aneurysm (HCC)    BPH (benign prostatic hyperplasia)    CAD (coronary artery disease)    Cerebral microvascular disease    CKD (chronic kidney disease), stage III (HCC)    Combined systolic and diastolic congestive heart failure (HCC)    DDD (degenerative disc disease), cervical    a.) s/p C5-C6 ACDF   DDD (degenerative disc disease), lumbar    ED (erectile dysfunction)    History of loop recorder (has been removed)    History of multiple strokes    Hyperlipidemia    Hypertension    Infrarenal abdominal aortic aneurysm (AAA) without rupture (HCC)    Intracranial hemorrhage (HCC)    Neurocardiogenic syncope    last syncopal episode was 01-2024   Paroxysmal atrial fibrillation (HCC)     a.) CHA2DS2VASc = 6 (age, CHF, HTN, CVA/TIA x 2, vascular disease) as of 05/20/2024; b.) rate/rhythm maintained on oral dofetilide  + carvedilol ; chronically anticoagulated with warfarin   Penetrating atherosclerotic ulcer of aorta (HCC)    Peroneal neuropathy, left    Peroneal neuropathy, left    Pre-diabetes    Presence of permanent cardiac pacemaker 05/31/2011   a.) MDT Revo placed 05/31/2011 --> ERI 09/17/2021; b.) PPM generator changed 12/30/2020 --> Medtronic Azure XT DR [id number] (SN: [id number])   RBBB (right bundle branch block)    Sepsis (HCC)    Sinus node dysfunction (HCC)    Sleep apnea, obstructive    cannot tolerate cpap-sleep study 10/09/2007  AHI 5.73/hr  REM AHI 11.90/hr   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   LEFT THUMB    Tachycardia-bradycardia (HCC)    a.) s/p PPM 05/31/2011   Thrombocytopenia (HCC)    Transient ischemic attack (TIA) 03/2006   Warfarin anticoagulation     Past Surgical History: Past Surgical History:  Procedure Laterality Date   ANTERIOR CERVICAL DECOMP/DISCECTOMY FUSION     Procedure: ACDF C5-C6   COLONOSCOPY     COLONOSCOPY N/A 06/07/2017   Procedure: COLONOSCOPY;  Surgeon: Alex Claudis PENNER, MD;  [facility];  Service: Endoscopy;  Laterality: N/A;  9:30   COLONOSCOPY N/A 09/20/2018   Procedure: COLONOSCOPY;  Surgeon: Alex Claudis PENNER, MD;  [facility];  Service: Endoscopy;  Laterality:  N/A;  1030   COLONOSCOPY WITH PROPOFOL  N/A 11/17/2023   Procedure: COLONOSCOPY WITH PROPOFOL ;  Surgeon: Alex Angelia Sieving, MD;  [facility]: [facility];  Service: Gastroenterology;  Laterality: N/A;  9:15AM;ASA 3   LOOP RECORDER IMPLANT  02/02/2011   Medtronic- CRM    LOOP RECORDER REMOVAL     PACEMAKER INSERTION  05/30/2011   Medtronic Revo   POLYPECTOMY  09/20/2018   Procedure: POLYPECTOMY;  Surgeon: Alex Claudis PENNER, MD;  [facility]: [facility];  Service: Endoscopy;;  colon   POLYPECTOMY  11/17/2023    Procedure: POLYPECTOMY;  Surgeon: Alex Angelia Sieving, MD;  Location: [facility];  Service: Gastroenterology;;   [facility] GENERATOR CHANGEOUT N/A 12/30/2020   Procedure: PPM GENERATOR CHANGEOUT;  Surgeon: Alex Headland, MD;  Location: [facility];  Service: Cardiovascular;  Laterality: N/A;   WISDOM TOOTH EXTRACTION      Allergies: Allergies as of 04/01/2024 - Review Complete 04/01/2024  Allergen Reaction Noted   Prevnar 13 [pneumococcal 13-val conj vacc]  06/23/2022    Medications: Outpatient Encounter Medications as of 04/01/2024  Medication Sig   carvedilol  (COREG ) 25 MG tablet Take 2 tablets (50 mg total) by mouth 2 (two) times daily with a meal.   Cyanocobalamin  (B-12 PO) Take 1 capsule by mouth daily.   diazepam (VALIUM) 2 MG tablet Take 2 mg by mouth every 8 (eight) hours as needed for anxiety.    dofetilide  (TIKOSYN ) 250 MCG capsule TAKE ONE (1) CAPSULE BY MOUTH TWICE A DAY. (EVERY 12 HOURS.)   finasteride  (PROSCAR ) 5 MG tablet Take 5 mg by mouth daily.   furosemide  (LASIX ) 20 MG tablet Take 1 tablet (20 mg total) by mouth daily as needed for fluid or edema (20 MG daily as needed for swelling- take potassium when you take this).   gabapentin  (NEURONTIN ) 100 MG capsule Take 100 mg by mouth 3 (three) times daily.   irbesartan  (AVAPRO ) 75 MG tablet Take 1 tablet (75 mg total) by mouth daily.   magnesium  oxide (MAG-OX) 400 MG tablet Take 1 tablet (400 mg total) by mouth daily.   Misc Natural Products (NEURIVA) CAPS Take 1 capsule by mouth daily. For memory   Multiple Vitamin (MULTIVITAMIN WITH MINERALS) TABS tablet Take 1 tablet by mouth daily. Adult Multivitamin 50+   Polyethyl Glycol-Propyl Glycol 0.4-0.3 % SOLN Place 1 drop into both eyes 3 (three) times daily as needed (for dry eyes.).   potassium chloride  (KLOR-CON ) 10 MEQ tablet Take 2 tablets (20 mEq total) by mouth daily as needed (take 2 tablets daily when you take Fursosemide).   rosuvastatin  (CRESTOR ) 20 MG  tablet TAKE ONE TABLET (20MG  TOTAL) BY MOUTH DAILY   Saw Palmetto 450 MG CAPS Take 900 mg by mouth daily.   trospium (SANCTURA) 20 MG tablet Take 20 mg by mouth daily.   warfarin (COUMADIN ) 5 MG tablet TAKE ONE-HALF TO ONE TABLET BY MOUTH EVERY DAY AS DIRECTED BY COUMADIN  CLINIC   No facility-administered encounter medications on file as of 04/01/2024.    Social History: Social History   Tobacco Use   Smoking status: Never   Smokeless tobacco: Never   Tobacco comments:    Never smoked 06/28/23  Vaping Use   Vaping status: Never Used  Substance Use Topics   Alcohol  use: Yes    Comment: occasionally wine   Drug use: No    Family Medical History: Family History  Problem Relation Age of Onset   Hypertension Mother    Heart attack  Father 40       deceased from heart attack   Hypertension Brother    Colon cancer Neg Hx     Physical Examination: Vitals:   05/21/24 0621  BP: 129/83  Pulse: (!) 59  Resp: 16  Temp: (!) 97.4 F (36.3 C)  SpO2: 100%      Awake, alert, oriented to person, place, and time.  Speech is clear and fluent. Fund of knowledge is appropriate.   Cranial Nerves: Pupils equal round and reactive to light.  Facial tone is symmetric.    No abnormal lesions on exposed skin.   Strength: Side Iliopsoas Quads Hamstring PF DF EHL EDC inv ev  R 5 5 5 5 5 5 5 5 5   L 5 5 5 5  4- 3 2 5  4-   Reflexes are 2+ and symmetric at the biceps, brachioradialis, patella and achilles.   Hoffman's is absent.  Clonus is not present.   Bilateral upper and lower extremity sensation is intact to light touch, but diminished in left foot in peroneal distribution  He can toe stand on both feet- has trouble doing independently on one leg. He has difficulty heel standing on left.   Gait is slow.   Medical Decision Making  Imaging: Lumbar MRI dated 02/22/24:  FINDINGS: Segmentation: Conventional anatomy assumed, with the last open disc space designated L5-S1.Concordant with  prior imaging.   Alignment:  Stable minimal degenerative anterolisthesis at L4-5.   Vertebrae: No worrisome osseous lesion, acute fracture or pars defect. Scattered small hemangiomas. Mild sacroiliac degenerative changes bilaterally.   Conus medullaris: Extends to the L2 level. The conus and cauda equina appear normal.   Paraspinal and other soft tissues: No significant paraspinal findings. Known abdominal aortic aneurysm is suboptimally evaluated, largely obscured by a saturation band on the sagittal images. Based on the axial images, this appears similar to recent CTA, measuring up to 3.8 cm on image 24/5.   Disc levels:   Sagittal images demonstrate no significant disc space findings within the visualized lower thoracic spine. There is mild disc bulging and anterior osteophytes at T10-11 and T11-12 without resulting spinal stenosis or nerve root encroachment.   T12-L1: No significant findings.   L1-2: No significant findings.   L2-3: Mildly increased loss of disc height with annular disc bulging and mild facet hypertrophy. Mild narrowing of the left lateral recess without definite nerve root encroachment. The spinal canal and foramina remain patent.   L3-4: Preserved disc height and hydration. Stable mild disc bulging and mild bilateral facet hypertrophy. No significant spinal stenosis or foraminal narrowing.   L4-5: Stable mild loss of disc height with mild disc bulging and moderate bilateral facet hypertrophy. Similar mild lateral recess and foraminal narrowing bilaterally without definite nerve root encroachment. The spinal canal remains patent.   L5-S1: Stable loss of disc height with annular disc bulging and endplate osteophytes asymmetric to the right. Mild facet and ligamentous hypertrophy. Mildly increased right foraminal narrowing without definite nerve root encroachment. The spinal canal and left foramen remain patent.   IMPRESSION: 1. No acute findings  or clear explanation for the patient's symptoms. 2. Mildly increased disc bulging and facet hypertrophy at L2-3 with mild narrowing of the left lateral recess. 3. Stable mild lateral recess and foraminal narrowing bilaterally at L4-5. 4. Mildly increased right foraminal narrowing at L5-S1 without definite nerve root encroachment. 5. Known abdominal aortic aneurysm appears similar to recent CTA.     Electronically Signed   By: Elsie  Gertrude M.D.   On: 03/25/2024 11:36  I have personally reviewed the images and agree with the above interpretation.    EMG of bilateral lower extremities dated 04/21/24:    I personally reviewed his MRI, did demonstrate degenerative disease, however his pattern is much more consistent with a peripheral entrapment rather than a spinal level entrapment.  We did discuss that he may need to address his spine in the future but at this point we feel the highest likelihood improvement will come from a peroneal decompression alone.   Assessment and Plan: Mr. Kunath has constant numbness in left foot with weakness.  He has been seen by [facility] as well as Tonita Blanch.  They diagnosed him with a left-sided peroneal neuropathy.  He does have a mild foot drop with significant weakness in his toe extensors and more mild but present weakness in dorsiflexion and eversion.  He has a positive Tinel sign at the fibular neck.  EMG demonstrated a demyelinating lesion at the fibular neck.  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  With this in mind he likely has a compression at the fibular head and will require a decompression.  MRI negative for cyst, will go forward with standard decompression.  We discussed risk and benefits of surgery including but not limited to in his case incomplete improvement in sensation loss, incomplete improvement in motor function, bleeding, numbness tingling nerve injury, infection.  Maddox MICAEL Sharps, MD Dept. of Neurosurgery

## 2024-05-21 NOTE — Discharge Instructions (Addendum)
 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 Increase protein intake to promote wound healing.  Recommended activity: no lifting, driving, or strenuous exercise for 2 weeks. You should walk multiple times per day  Special Instructions  No straining, no heavy lifting > 10lbs x 4 weeks.  Keep incision area clean and dry. May shower in 2 days. No baths or pools for 6 weeks.  Please remove dressing tomorrow, no need to apply a bandage afterwards  You have no sutures to remove, the skin is closed with adhesive  Please take pain medications as directed. Take a stool softener if on pain medications  *Regarding compression stockings-  Please wear day and night until you are walking a couple hundred feet three times a day.   Please Report any of the following: Nausea or Vomiting, Temperature is greater than 101.57F (38.1C) degrees, Dizziness, Abdominal Pain, Difficulty Breathing or Shortness of Breath, Inability to Eat, drink Fluids, or Take medications, Bleeding, swelling, or drainage from surgical incision sites, New numbness or weakness, and Bowel or bladder dysfunction to the neurosurgeon on call. How to contact us :  If you have any questions/concerns before or after surgery, you can reach us  at [phone], or you can send a mychart message. We can be reached by phone or mychart 8am-4pm, Monday-Friday.  *Please note: Calls after 4pm are forwarded to a third party answering service. Mychart messages are not routinely monitored during evenings, weekends, and holidays. Please call our office to contact the answering service for urgent concerns during non-business hours.   Additional Follow up appointments Please follow up with Lyle Decamp PA-C as scheduled in 2-3 weeks   Please see below for scheduled  appointments:  Future Appointments  Date Time Provider Department Center  06/03/2024  2:30 PM [facility]  [facility]  06/04/2024  3:00 PM Decamp Lyle, PA-C [facility] None  06/19/2024 10:30 AM Fenton, Clint R, PA [facility]  06/27/2024  7:00 AM [facility] DEVICE REMOTES [facility]  07/01/2024  2:30 PM Claudene Penne ORN, MD [facility] None  08/22/2024 10:00 AM Croitoru, Mihai, MD [facility]  09/26/2024  7:00 AM [facility] HVT DEVICE REMOTES [facility]  10/01/2024 10:45 AM [facility] LAB [facility] None  10/08/2024 11:30 AM Geofm Delon BRAVO, NP [facility] None  12/26/2024  7:00 AM [facility] HVT DEVICE REMOTES [facility]  03/27/2025  7:00 AM [facility] HVT DEVICE REMOTES [facility]

## 2024-05-22 ENCOUNTER — Encounter: Payer: Self-pay | Admitting: Neurosurgery

## 2024-05-28 DIAGNOSIS — G589 Mononeuropathy, unspecified: Secondary | ICD-10-CM | POA: Diagnosis not present

## 2024-05-28 DIAGNOSIS — H6690 Otitis media, unspecified, unspecified ear: Secondary | ICD-10-CM | POA: Diagnosis not present

## 2024-05-28 DIAGNOSIS — I10 Essential (primary) hypertension: Secondary | ICD-10-CM | POA: Diagnosis not present

## 2024-05-28 DIAGNOSIS — Z713 Dietary counseling and surveillance: Secondary | ICD-10-CM | POA: Diagnosis not present

## 2024-05-28 DIAGNOSIS — Z8679 Personal history of other diseases of the circulatory system: Secondary | ICD-10-CM | POA: Diagnosis not present

## 2024-05-28 DIAGNOSIS — E782 Mixed hyperlipidemia: Secondary | ICD-10-CM | POA: Diagnosis not present

## 2024-05-28 DIAGNOSIS — R7303 Prediabetes: Secondary | ICD-10-CM | POA: Diagnosis not present

## 2024-06-03 ENCOUNTER — Ambulatory Visit: Attending: Cardiovascular Disease | Admitting: *Deleted

## 2024-06-03 DIAGNOSIS — I4891 Unspecified atrial fibrillation: Secondary | ICD-10-CM

## 2024-06-03 DIAGNOSIS — Z5181 Encounter for therapeutic drug level monitoring: Secondary | ICD-10-CM | POA: Diagnosis not present

## 2024-06-03 LAB — POCT INR: INR: 1.9 — AB (ref 2.0–3.0)

## 2024-06-03 NOTE — Progress Notes (Signed)
 INR 1.9; Please see anticoagulation encounter

## 2024-06-03 NOTE — Patient Instructions (Signed)
 Continue warfarin 1 tablet daily. S/P peroneal nerve decompression on 05/21/24.  Held warfarin 7 days before procedure and 3 days after procedure. On Cifdinir twice daily x 6 more days. Recheck in 3 wks

## 2024-06-04 ENCOUNTER — Encounter: Payer: Self-pay | Admitting: Physician Assistant

## 2024-06-04 ENCOUNTER — Ambulatory Visit (INDEPENDENT_AMBULATORY_CARE_PROVIDER_SITE_OTHER): Admitting: Physician Assistant

## 2024-06-04 VITALS — BP 118/76 | Temp 99.1°F | Ht 72.0 in | Wt 194.2 lb

## 2024-06-04 DIAGNOSIS — Z09 Encounter for follow-up examination after completed treatment for conditions other than malignant neoplasm: Secondary | ICD-10-CM

## 2024-06-04 DIAGNOSIS — G5732 Lesion of lateral popliteal nerve, left lower limb: Secondary | ICD-10-CM

## 2024-06-04 MED ORDER — GABAPENTIN 100 MG PO CAPS: 100.0000 mg | ORAL_CAPSULE | Freq: Two times a day (BID) | ORAL | 1 refills | Status: DC

## 2024-06-04 NOTE — Progress Notes (Signed)
 Remote pacemaker transmission.

## 2024-06-04 NOTE — Progress Notes (Unsigned)
   REFERRING PHYSICIAN:  [name], Md [address] [place],  [place] [address]  DOS: 05/21/24, left peroneal nerve decompression  HISTORY OF PRESENT ILLNESS: Alex Maddox is approximately 2 weeks status post left peroneal nerve decompression. he is doing well.  Not a lot of new pain.  He has had some swelling.  Has not noticed a significant difference postoperatively at this point.    PHYSICAL EXAMINATION:  General: Patient is well developed, well nourished, calm, collected, and in no apparent distress.   NEUROLOGICAL:  General: In no acute distress.   Awake, alert, oriented to person, place, and time.  Pupils equal round and reactive to light.  Facial tone is symmetric.     Strength:             Side Iliopsoas Quads Hamstring PF DF EHL EDC inv ev  R 5 5 5 5 5 5 5 5 5   L 5 5 5 5  4- 3 2 5  4-    Incision c/d/i   ROS (Neurologic):  Negative except as noted above  IMAGING: No new imaging  ASSESSMENT/PLAN:  Alex Maddox is doing well approximately 2 weeks after left peroneal nerve decompression.  His pain is under control, waiting to see neurologic improvement as time goes on.  Patient was counseled on the slow nature of potential nerve recovery.  he will follow up in 1 month for his 6-week postop visit.   Advised to contact the office if any questions or concerns arise.  Lyle Decamp PA-C Department of neurosurgery

## 2024-06-17 ENCOUNTER — Other Ambulatory Visit (HOSPITAL_COMMUNITY): Payer: Self-pay | Admitting: Physician Assistant

## 2024-06-19 ENCOUNTER — Ambulatory Visit (HOSPITAL_COMMUNITY)
Admission: RE | Admit: 2024-06-19 | Discharge: 2024-06-19 | Disposition: A | Discharge status: Home / Self Care | Source: Ambulatory Visit | Attending: Physician Assistant | Admitting: Physician Assistant

## 2024-06-19 VITALS — BP 122/78 | HR 60 | Ht 72.0 in | Wt 192.8 lb

## 2024-06-19 DIAGNOSIS — Z79899 Other long term (current) drug therapy: Secondary | ICD-10-CM | POA: Diagnosis not present

## 2024-06-19 DIAGNOSIS — Z5181 Encounter for therapeutic drug level monitoring: Secondary | ICD-10-CM

## 2024-06-19 DIAGNOSIS — I4819 Other persistent atrial fibrillation: Secondary | ICD-10-CM

## 2024-06-19 DIAGNOSIS — I4891 Unspecified atrial fibrillation: Secondary | ICD-10-CM | POA: Diagnosis not present

## 2024-06-19 DIAGNOSIS — D6869 Other thrombophilia: Secondary | ICD-10-CM | POA: Diagnosis not present

## 2024-06-19 DIAGNOSIS — I480 Paroxysmal atrial fibrillation: Secondary | ICD-10-CM

## 2024-06-19 NOTE — Progress Notes (Signed)
 Primary Care Physician: Shona Norleen PEDLAR, MD Primary Cardiologist: Dr Francyne Primary Electrophysiologist: none Referring Physician: Dr Francyne Dickens Alex Maddox is a 73 y.o. male with a history of OSA, coronary calcifications, CHF, thoracic and abdominal aortic aneurysm, neurally mediated syncope, tachycardia-bradycardia syndrome s/p pacemaker, atrial fibrillation, atrial flutter who presents for follow up in the [facility]. Patient is on warfarin for stroke prevention. He was seen by Dr Francyne 01/21/22 in rapid atrial flutter. Overdrive pacing with his device converted him to rate controlled afib. Patient called [facility] with heart rates back up to 140s with some SOB and lower extremity edema. He is s/p dofetilide  loading 01/2022.  Patient returns for follow up for atrial fibrillation and dofetilide  monitoring. He is in SR today and feels well. He does have palpitations at times. His PPM shows 19% afib burden. No bleeding issues on anticoagulation.   Today, he  denies symptoms of chest pain, shortness of breath, orthopnea, PND, lower extremity edema, dizziness, presyncope, syncope, bleeding, or neurologic sequela. The patient is tolerating medications without difficulties and is otherwise without complaint today.    Atrial Fibrillation Risk Factors:  he does have symptoms or diagnosis of sleep apnea. he does not have a history of rheumatic fever.   Atrial Fibrillation Management history:  Previous antiarrhythmic drugs: dofetilide   Previous cardioversions: none Previous ablations: none Anticoagulation history: warfarin    Past Medical History:  Diagnosis Date   Aortic atherosclerosis (HCC)    Arthritis of spine    Ascending aortic aneurysm (HCC)    BPH (benign prostatic hyperplasia)    CAD (coronary artery disease)    Cerebral microvascular disease    CKD (chronic kidney disease), stage III (HCC)    Combined systolic and diastolic congestive heart  failure (HCC)    DDD (degenerative disc disease), cervical    a.) s/p C5-C6 ACDF   DDD (degenerative disc disease), lumbar    ED (erectile dysfunction)    History of loop recorder (has been removed)    History of multiple strokes    Hyperlipidemia    Hypertension    Infrarenal abdominal aortic aneurysm (AAA) without rupture (HCC)    Intracranial hemorrhage (HCC)    Neurocardiogenic syncope    last syncopal episode was 01-2024   Paroxysmal atrial fibrillation (HCC)    a.) CHA2DS2VASc = 6 (age, CHF, HTN, CVA/TIA x 2, vascular disease) as of 05/20/2024; b.) rate/rhythm maintained on oral dofetilide  + carvedilol ; chronically anticoagulated with warfarin   Penetrating atherosclerotic ulcer of aorta (HCC)    Peroneal neuropathy, left    Peroneal neuropathy, left    Pre-diabetes    Presence of permanent cardiac pacemaker 05/31/2011   a.) MDT Revo placed 05/31/2011 --> ERI 09/17/2021; b.) PPM generator changed 12/30/2020 --> Medtronic Azure XT DR [id number] (SN: [id number])   RBBB (right bundle branch block)    Sepsis (HCC)    Sinus node dysfunction (HCC)    Sleep apnea, obstructive    cannot tolerate cpap-sleep study 10/09/2007  AHI 5.73/hr  REM AHI 11.90/hr   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   LEFT THUMB    Tachycardia-bradycardia (HCC)    a.) s/p PPM 05/31/2011   Thrombocytopenia (HCC)    Transient ischemic attack (TIA) 03/2006   Warfarin anticoagulation     Current Outpatient Medications  Medication Sig Dispense Refill   carvedilol  (COREG ) 25 MG tablet Take 2 tablets (50 mg total) by mouth 2 (two) times daily with a  meal. 360 tablet 3   Cyanocobalamin  (B-12 PO) Take 1 tablet by mouth daily.     diazepam (VALIUM) 2 MG tablet Take 2 mg by mouth every 8 (eight) hours as needed for anxiety.      dofetilide  (TIKOSYN ) 250 MCG capsule TAKE ONE (1) CAPSULE BY MOUTH TWICE A DAY. (EVERY 12 HOURS.) 60 capsule 6   finasteride  (PROSCAR ) 5 MG tablet Take 5 mg by mouth every morning.      furosemide  (LASIX ) 20 MG tablet Take 1 tablet (20 mg total) by mouth daily as needed for fluid or edema (20 MG daily as needed for swelling- take potassium when you take this). (Patient taking differently: Take 20 mg by mouth as needed for fluid or edema (20 MG daily as needed for swelling- take potassium when you take this).) 90 tablet 3   gabapentin  (NEURONTIN ) 100 MG capsule Take 1 capsule (100 mg total) by mouth 2 (two) times daily. 60 capsule 1   irbesartan  (AVAPRO ) 75 MG tablet Take 1 tablet (75 mg total) by mouth daily. 90 tablet 3   magnesium  gluconate (MAGONATE) 500 (27 Mg) MG TABS tablet Take 500 mg by mouth daily.     Multiple Vitamin (MULTIVITAMIN WITH MINERALS) TABS tablet Take 1 tablet by mouth daily. Adult Multivitamin 50+     OVER THE COUNTER MEDICATION Take 1 tablet by mouth at bedtime. Equate Brain Health Supplement     Polyethyl Glycol-Propyl Glycol 0.4-0.3 % SOLN Place 1 drop into both eyes 3 (three) times daily as needed (for dry eyes.).     potassium chloride  (KLOR-CON ) 10 MEQ tablet Take 2 tablets (20 mEq total) by mouth daily as needed (take 2 tablets daily when you take Fursosemide). (Patient taking differently: Take 30 mEq by mouth every morning.) 180 tablet 3   rosuvastatin  (CRESTOR ) 20 MG tablet TAKE ONE TABLET (20MG  TOTAL) BY MOUTH DAILY 90 tablet 2   Saw Palmetto 450 MG CAPS Take 900 mg by mouth daily.     traMADol  (ULTRAM ) 50 MG tablet Take 1 tablet (50 mg total) by mouth every 6 (six) hours as needed. 30 tablet 0   trospium (SANCTURA) 20 MG tablet Take 20 mg by mouth at bedtime.     warfarin (COUMADIN ) 5 MG tablet TAKE ONE-HALF TO ONE TABLET BY MOUTH EVERY DAY AS DIRECTED BY COUMADIN  CLINIC 90 tablet 1   No current facility-administered medications for this encounter.    ROS- All systems are reviewed and negative except as per the HPI above.  Physical Exam: Vitals:   06/19/24 1035  BP: 122/78  Pulse: 60  Weight: 87.5 kg  Height: 6' (1.829 m)     GEN:  Well nourished, well developed in no acute distress CARDIAC: Regular rate and rhythm, no murmurs, rubs, gallops RESPIRATORY:  Clear to auscultation without rales, wheezing or rhonchi  ABDOMEN: Soft, non-tender, non-distended EXTREMITIES:  No edema; No deformity    Wt Readings from Last 3 Encounters:  06/19/24 87.5 kg  06/04/24 88.1 kg  05/08/24 86.8 kg    EKG today demonstrates A paced rhythm, RBBB Vent. rate 60 BPM PR interval 196 ms QRS duration 128 ms QT/QTcB 462/462 ms   Echo 11/08/23  1. Left ventricular ejection fraction, by estimation, is 50 to 55%. The  left ventricle has low normal function. The left ventricle has no regional  wall motion abnormalities. Left ventricular diastolic parameters were  normal. The average left ventricular global longitudinal strain is -11.6 %. The global longitudinal strain is abnormal.  2. Right ventricular systolic function is normal. The right ventricular  size is normal. There is normal pulmonary artery systolic pressure.   3. The mitral valve is degenerative. No evidence of mitral valve  regurgitation. No evidence of mitral stenosis.   4. The aortic valve is tricuspid. Aortic valve regurgitation is trivial.  Aortic valve sclerosis is present, with no evidence of aortic valve  stenosis.   5. Aortic dilatation noted. There is dilatation of the ascending aorta,  measuring 41 mm.   6. The inferior vena cava is normal in size with greater than 50%  respiratory variability, suggesting right atrial pressure of 3 mmHg.   Comparison(s): A prior study was performed on 01/25/2022. LVEF 45%, global hypokinesis, indeterminate diastolic dysfunction, average HOD-88.0%, RV mildly reduced and mildly enlarged, mild MR, moderate TR, ascending Ao 40mm, estimated RAP .       Epic records are reviewed at length today   CHA2DS2-VASc Score = 6  The patient's score is based upon: CHF History: 1 HTN History: 1 Diabetes History: 0 Stroke History:  2 Vascular Disease History: 1 Age Score: 1 Gender Score: 0       ASSESSMENT AND PLAN: Paroxysmal Atrial Fibrillation (ICD10:  I48.0) The patient's CHA2DS2-VASc score is 6, indicating a 9.7% annual risk of stroke.   S/p dofetilide  loading 01/2022 Patient is in SR today, device shows 19% afib burden, previously in the single digits.  We discussed rhythm control options today. He is interested in afib ablation for his increased afib burden, would also like to get off dofetilide  if possible. Will refer him to EP to discuss.  Continue dofetilide  250 mcg BID Continue warfarin Continue carvedilol  50 mg BID  Secondary Hypercoagulable State (ICD10:  D68.69) The patient is at significant risk for stroke/thromboembolism based upon his CHA2DS2-VASc Score of 6.  Continue Warfarin (Coumadin ). No bleeding issues.   High Risk Medication Monitoring (ICD 10: J342684) Patient requires ongoing monitoring for anti-arrhythmic medication which has the potential to cause life threatening arrhythmias. QT interval on ECG acceptable for dofetilide  monitoring. Check bmet/mag today.     Tachybradycardia syndrome  S/p PPM, followed by Dr Francyne  OSA  Encouraged nightly CPAP  HFrecEF EF 50-55% GDMT per primary cardiology team Fluid status appears stable today  HTN Stable on current regimen   Follow up with EP to establish care and discuss afib ablation.    Alex Kicks PA-C [facility] [address] [place], [place] [address] [phone]

## 2024-06-20 ENCOUNTER — Ambulatory Visit (HOSPITAL_COMMUNITY): Payer: Self-pay | Admitting: Physician Assistant

## 2024-06-20 LAB — BASIC METABOLIC PANEL WITH GFR
BUN/Creatinine Ratio: 15 (ref 10–24)
BUN: 18 mg/dL (ref 8–27)
CO2: 26 mmol/L (ref 20–29)
Calcium: 10.1 mg/dL (ref 8.6–10.2)
Chloride: 102 mmol/L (ref 96–106)
Creatinine, Ser: 1.24 mg/dL (ref 0.76–1.27)
Glucose: 79 mg/dL (ref 70–99)
Potassium: 4.5 mmol/L (ref 3.5–5.2)
Sodium: 140 mmol/L (ref 134–144)
eGFR: 62 mL/min/1.73 (ref >59)

## 2024-06-20 LAB — MAGNESIUM: Magnesium: 2.3 mg/dL (ref 1.6–2.3)

## 2024-06-24 ENCOUNTER — Ambulatory Visit: Attending: Cardiovascular Disease | Admitting: *Deleted

## 2024-06-24 DIAGNOSIS — Z5181 Encounter for therapeutic drug level monitoring: Secondary | ICD-10-CM | POA: Diagnosis not present

## 2024-06-24 DIAGNOSIS — I4891 Unspecified atrial fibrillation: Secondary | ICD-10-CM | POA: Diagnosis not present

## 2024-06-24 LAB — POCT INR: INR: 3.2 — AB (ref 2.0–3.0)

## 2024-06-24 NOTE — Progress Notes (Signed)
 INR 3.2 Please see anticoagulation encounter

## 2024-06-24 NOTE — Patient Instructions (Signed)
Take warfarin 1/2 tablet tonight then resume 1 tablet daily.   Recheck in 4 wks

## 2024-06-26 ENCOUNTER — Ambulatory Visit (HOSPITAL_COMMUNITY): Admitting: Physician Assistant

## 2024-06-27 ENCOUNTER — Ambulatory Visit (INDEPENDENT_AMBULATORY_CARE_PROVIDER_SITE_OTHER): Payer: Medicare Other

## 2024-06-27 DIAGNOSIS — I495 Sick sinus syndrome: Secondary | ICD-10-CM | POA: Diagnosis not present

## 2024-06-27 LAB — CUP PACEART REMOTE DEVICE CHECK
Battery Remaining Longevity: 125 mo
Battery Voltage: 3.01 V
Brady Statistic AP VP Percent: 24.19 %
Brady Statistic AP VS Percent: 29.79 %
Brady Statistic AS VP Percent: 0.31 %
Brady Statistic AS VS Percent: 45.78 %
Brady Statistic RA Percent Paced: 44.81 %
Brady Statistic RV Percent Paced: 22.45 %
Date Time Interrogation Session: 20250917231008
Implantable Lead Connection Status: 753985
Implantable Lead Connection Status: 753985
Implantable Lead Implant Date: 20120820
Implantable Lead Implant Date: 20120820
Implantable Lead Location: 753859
Implantable Lead Location: 753860
Implantable Pulse Generator Implant Date: 20220323
Lead Channel Impedance Value: 342 Ohm
Lead Channel Impedance Value: 380 Ohm
Lead Channel Impedance Value: 570 Ohm
Lead Channel Impedance Value: 646 Ohm
Lead Channel Pacing Threshold Amplitude: 0.625 V
Lead Channel Pacing Threshold Amplitude: 1.375 V
Lead Channel Pacing Threshold Pulse Width: 0.4 ms
Lead Channel Pacing Threshold Pulse Width: 0.4 ms
Lead Channel Sensing Intrinsic Amplitude: 6.125 mV
Lead Channel Sensing Intrinsic Amplitude: 6.125 mV
Lead Channel Sensing Intrinsic Amplitude: 6.5 mV
Lead Channel Sensing Intrinsic Amplitude: 6.5 mV
Lead Channel Setting Pacing Amplitude: 1.5 V
Lead Channel Setting Pacing Amplitude: 2.75 V
Lead Channel Setting Pacing Pulse Width: 0.4 ms
Lead Channel Setting Sensing Sensitivity: 0.9 mV
Zone Setting Status: 755011

## 2024-07-01 ENCOUNTER — Ambulatory Visit (INDEPENDENT_AMBULATORY_CARE_PROVIDER_SITE_OTHER): Admitting: Neurosurgery

## 2024-07-01 VITALS — BP 110/74 | Temp 97.4°F | Ht 72.0 in | Wt 196.0 lb

## 2024-07-01 DIAGNOSIS — G5732 Lesion of lateral popliteal nerve, left lower limb: Secondary | ICD-10-CM

## 2024-07-01 DIAGNOSIS — Z09 Encounter for follow-up examination after completed treatment for conditions other than malignant neoplasm: Secondary | ICD-10-CM

## 2024-07-01 NOTE — Progress Notes (Signed)
 Referring Physician:  [name], MD [address] [place],  [place] [address]  Primary Physician:  Shona Rush PEDLAR, MD  History of Present Illness: 07/01/2024 Alex Maddox has a history of afib, HTN, AAA, heart failure, OSA, hyperlipidemia, TIA.  He has a pacemaker.  He has been having numbness and weakness in his left lower extremity.  He was seen and evaluated found to have peroneal neuropathy at the fibular neck.  Underwent a decompression.  He states that he has felt a significant amount of motor improvement.  He is also noted some sensory improvement.  He states that he can walk without his AFO now.  Bowel/Bladder Dysfunction: he has urinary urgency. No bowel issues. No perineal numbness.   The symptoms are causing a significant impact on the patient's life.   Review of Systems:  A 10 point review of systems is negative, except for the pertinent positives and negatives detailed in the HPI.  Past Medical History: Past Medical History:  Diagnosis Date   Aortic atherosclerosis    Arthritis of spine    Ascending aortic aneurysm    BPH (benign prostatic hyperplasia)    CAD (coronary artery disease)    Cerebral microvascular disease    CKD (chronic kidney disease), stage III (HCC)    Combined systolic and diastolic congestive heart failure (HCC)    DDD (degenerative disc disease), cervical    a.) s/p C5-C6 ACDF   DDD (degenerative disc disease), lumbar    ED (erectile dysfunction)    History of loop recorder (has been removed)    History of multiple strokes    Hyperlipidemia    Hypertension    Infrarenal abdominal aortic aneurysm (AAA) without rupture (HCC)    Intracranial hemorrhage (HCC)    Neurocardiogenic syncope    last syncopal episode was 01-2024   Paroxysmal atrial fibrillation (HCC)    a.) CHA2DS2VASc = 6 (age, CHF, HTN, CVA/TIA x 2, vascular disease) as of 05/20/2024; b.) rate/rhythm maintained on oral dofetilide  + carvedilol ; chronically anticoagulated  with warfarin   Penetrating atherosclerotic ulcer of aorta (HCC)    Peroneal neuropathy, left    Peroneal neuropathy, left    Pre-diabetes    Presence of permanent cardiac pacemaker 05/31/2011   a.) MDT Revo placed 05/31/2011 --> ERI 09/17/2021; b.) PPM generator changed 12/30/2020 --> Medtronic Azure XT DR [id number] (SN: [id number])   RBBB (right bundle branch block)    Sepsis (HCC)    Sinus node dysfunction (HCC)    Sleep apnea, obstructive    cannot tolerate cpap-sleep study 10/09/2007  AHI 5.73/hr  REM AHI 11.90/hr   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   LEFT THUMB    Tachycardia-bradycardia (HCC)    a.) s/p PPM 05/31/2011   Thrombocytopenia    Transient ischemic attack (TIA) 03/2006   Warfarin anticoagulation     Past Surgical History: Past Surgical History:  Procedure Laterality Date   ANTERIOR CERVICAL DECOMP/DISCECTOMY FUSION     Procedure: ACDF C5-C6   COLONOSCOPY     COLONOSCOPY N/A 06/07/2017   Procedure: COLONOSCOPY;  Surgeon: [name], MD;  [facility];  Service: Endoscopy;  Laterality: N/A;  9:30   COLONOSCOPY N/A 09/20/2018   Procedure: COLONOSCOPY;  Surgeon: [name], MD;  Location: [facility];  Service: Endoscopy;  Laterality: N/A;  1030   COLONOSCOPY WITH PROPOFOL  N/A 11/17/2023   Procedure: COLONOSCOPY WITH PROPOFOL ;  Surgeon: [name], MD;  Location: [facility];  Service: Gastroenterology;  Laterality: N/A;  9:15AM;ASA 3   LOOP RECORDER IMPLANT  02/02/2011   Medtronic- CRM    LOOP RECORDER REMOVAL     PACEMAKER INSERTION  05/30/2011   Medtronic Revo   PERONEAL NERVE DECOMPRESSION Left 05/21/2024   Procedure: PERONEAL NERVE DECOMPRESSION;  Surgeon: Claudene Penne ORN, MD;  Location: [facility];  Service: Neurosurgery;  Laterality: Left;  OPEN LEFT PERONEAL NERVE DECOMPRESSION   POLYPECTOMY  09/20/2018   Procedure: POLYPECTOMY;  Surgeon: [name], MD;  [facility];  Service:  Endoscopy;;  colon   POLYPECTOMY  11/17/2023   Procedure: POLYPECTOMY;  Surgeon: [name], MD;  Location: [facility];  Service: Gastroenterology;;   [facility] GENERATOR CHANGEOUT N/A 12/30/2020   Procedure: PPM GENERATOR CHANGEOUT;  Surgeon: Francyne Headland, MD;  Location: [facility];  Service: Cardiovascular;  Laterality: N/A;   WISDOM TOOTH EXTRACTION      Allergies: Allergies as of 07/01/2024 - Review Complete 07/01/2024  Allergen Reaction Noted   Prevnar 13 [pneumococcal 13-val conj vacc]  06/23/2022    Medications: Outpatient Encounter Medications as of 07/01/2024  Medication Sig   carvedilol  (COREG ) 25 MG tablet Take 2 tablets (50 mg total) by mouth 2 (two) times daily with a meal.   Cyanocobalamin  (B-12 PO) Take 1 tablet by mouth daily.   diazepam (VALIUM) 2 MG tablet Take 2 mg by mouth every 8 (eight) hours as needed for anxiety.    dofetilide  (TIKOSYN ) 250 MCG capsule TAKE ONE (1) CAPSULE BY MOUTH TWICE A DAY. (EVERY 12 HOURS.)   finasteride  (PROSCAR ) 5 MG tablet Take 5 mg by mouth every morning.   furosemide  (LASIX ) 20 MG tablet Take 1 tablet (20 mg total) by mouth daily as needed for fluid or edema (20 MG daily as needed for swelling- take potassium when you take this). (Patient taking differently: Take 20 mg by mouth as needed for fluid or edema (20 MG daily as needed for swelling- take potassium when you take this).)   gabapentin  (NEURONTIN ) 100 MG capsule Take 1 capsule (100 mg total) by mouth 2 (two) times daily.   irbesartan  (AVAPRO ) 75 MG tablet Take 1 tablet (75 mg total) by mouth daily.   magnesium  gluconate (MAGONATE) 500 (27 Mg) MG TABS tablet Take 500 mg by mouth daily.   Multiple Vitamin (MULTIVITAMIN WITH MINERALS) TABS tablet Take 1 tablet by mouth daily. Adult Multivitamin 50+   OVER THE COUNTER MEDICATION Take 1 tablet by mouth at bedtime. Equate Brain Health Supplement   Polyethyl Glycol-Propyl Glycol 0.4-0.3 % SOLN Place 1 drop into both  eyes 3 (three) times daily as needed (for dry eyes.).   potassium chloride  (KLOR-CON ) 10 MEQ tablet Take 2 tablets (20 mEq total) by mouth daily as needed (take 2 tablets daily when you take Fursosemide). (Patient taking differently: Take 30 mEq by mouth every morning.)   rosuvastatin  (CRESTOR ) 20 MG tablet TAKE ONE TABLET (20MG  TOTAL) BY MOUTH DAILY   Saw Palmetto 450 MG CAPS Take 900 mg by mouth daily.   traMADol  (ULTRAM ) 50 MG tablet Take 1 tablet (50 mg total) by mouth every 6 (six) hours as needed.   trospium (SANCTURA) 20 MG tablet Take 20 mg by mouth at bedtime.   warfarin (COUMADIN ) 5 MG tablet TAKE ONE-HALF TO ONE TABLET BY MOUTH EVERY DAY AS DIRECTED BY COUMADIN  CLINIC   No facility-administered encounter medications on file as of 07/01/2024.    Social History: Social History   Tobacco Use  Smoking status: Never   Smokeless tobacco: Never   Tobacco comments:    Never smoked 06/28/23  Vaping Use   Vaping status: Never Used  Substance Use Topics   Alcohol  use: Yes    Comment: occasionally wine   Drug use: No    Family Medical History: Family History  Problem Relation Age of Onset   Hypertension Mother    Heart attack Father 73       deceased from heart attack   Hypertension Brother    Colon cancer Neg Hx     Physical Examination: Vitals:   07/01/24 1436  BP: 110/74  Temp: (!) 97.4 F (36.3 C)      Awake, alert, oriented to person, place, and time.  Speech is clear and fluent. Fund of knowledge is appropriate.   Cranial Nerves: Pupils equal round and reactive to light.  Facial tone is symmetric.    No abnormal lesions on exposed skin.   Strength: Side Iliopsoas Quads Hamstring PF DF EHL EDC inv ev  R 5 5 5 5 5 5 5 5 5   L 5 5 5 5  4+ 4+ 3 5 4+   Reflexes are 2+ and symmetric at the biceps, brachioradialis, patella and achilles.   Hoffman's is absent.  Clonus is not present.   Bilateral upper and lower extremity sensation is intact to light touch, but  diminished in left foot in peroneal distribution    Medical Decision Making  Imaging: Lumbar MRI dated 02/22/24:  FINDINGS: Segmentation: Conventional anatomy assumed, with the last open disc space designated L5-S1.Concordant with prior imaging.   Alignment:  Stable minimal degenerative anterolisthesis at L4-5.   Vertebrae: No worrisome osseous lesion, acute fracture or pars defect. Scattered small hemangiomas. Mild sacroiliac degenerative changes bilaterally.   Conus medullaris: Extends to the L2 level. The conus and cauda equina appear normal.   Paraspinal and other soft tissues: No significant paraspinal findings. Known abdominal aortic aneurysm is suboptimally evaluated, largely obscured by a saturation band on the sagittal images. Based on the axial images, this appears similar to recent CTA, measuring up to 3.8 cm on image 24/5.   Disc levels:   Sagittal images demonstrate no significant disc space findings within the visualized lower thoracic spine. There is mild disc bulging and anterior osteophytes at T10-11 and T11-12 without resulting spinal stenosis or nerve root encroachment.   T12-L1: No significant findings.   L1-2: No significant findings.   L2-3: Mildly increased loss of disc height with annular disc bulging and mild facet hypertrophy. Mild narrowing of the left lateral recess without definite nerve root encroachment. The spinal canal and foramina remain patent.   L3-4: Preserved disc height and hydration. Stable mild disc bulging and mild bilateral facet hypertrophy. No significant spinal stenosis or foraminal narrowing.   L4-5: Stable mild loss of disc height with mild disc bulging and moderate bilateral facet hypertrophy. Similar mild lateral recess and foraminal narrowing bilaterally without definite nerve root encroachment. The spinal canal remains patent.   L5-S1: Stable loss of disc height with annular disc bulging and endplate osteophytes  asymmetric to the right. Mild facet and ligamentous hypertrophy. Mildly increased right foraminal narrowing without definite nerve root encroachment. The spinal canal and left foramen remain patent.   IMPRESSION: 1. No acute findings or clear explanation for the patient's symptoms. 2. Mildly increased disc bulging and facet hypertrophy at L2-3 with mild narrowing of the left lateral recess. 3. Stable mild lateral recess and foraminal narrowing bilaterally at L4-5.  4. Mildly increased right foraminal narrowing at L5-S1 without definite nerve root encroachment. 5. Known abdominal aortic aneurysm appears similar to recent CTA.     Electronically Signed   By: Elsie Perone M.D.   On: 03/25/2024 11:36  I have personally reviewed the images and agree with the above interpretation.    EMG of bilateral lower extremities dated 04/21/24:    I personally reviewed his MRI, did demonstrate degenerative disease, however his pattern is much more consistent with a peripheral entrapment rather than a spinal level entrapment.  We did discuss that he may need to address his spine in the future but at this point we feel the highest likelihood improvement will come from a peroneal decompression alone.   Assessment and Plan: [name] has constant numbness in left foot with weakness.  He has been seen by Glade Boys as well as Tonita Blanch.  They diagnosed him with a left-sided peroneal neuropathy.  He underwent peroneal nerve decompression and has had a significant motor recovery.  He is having some sensory recovery but this is slow as expected.  I did discuss with him that the sensory recovery is often incomplete.  At this point he feels like he can walk without his AFO, I let him know that we will plan on ordering some physical therapy to help him optimize his motor outcome.  Continue to follow him for recovery.  Penne MICAEL Sharps, MD Dept. of Neurosurgery

## 2024-07-01 NOTE — Patient Instructions (Signed)

## 2024-07-02 NOTE — Progress Notes (Signed)
 Remote PPM Transmission

## 2024-07-06 ENCOUNTER — Ambulatory Visit: Payer: Self-pay | Admitting: Cardiovascular Disease

## 2024-07-17 DIAGNOSIS — D631 Anemia in chronic kidney disease: Secondary | ICD-10-CM | POA: Diagnosis not present

## 2024-07-17 DIAGNOSIS — R809 Proteinuria, unspecified: Secondary | ICD-10-CM | POA: Diagnosis not present

## 2024-07-17 DIAGNOSIS — N189 Chronic kidney disease, unspecified: Secondary | ICD-10-CM | POA: Diagnosis not present

## 2024-07-17 DIAGNOSIS — E119 Type 2 diabetes mellitus without complications: Secondary | ICD-10-CM | POA: Diagnosis not present

## 2024-07-17 DIAGNOSIS — I10 Essential (primary) hypertension: Secondary | ICD-10-CM | POA: Diagnosis not present

## 2024-07-22 ENCOUNTER — Encounter

## 2024-07-22 DIAGNOSIS — M6281 Muscle weakness (generalized): Secondary | ICD-10-CM | POA: Diagnosis not present

## 2024-07-22 DIAGNOSIS — M25572 Pain in left ankle and joints of left foot: Secondary | ICD-10-CM | POA: Diagnosis not present

## 2024-07-23 ENCOUNTER — Ambulatory Visit: Admitting: Cardiology

## 2024-07-23 DIAGNOSIS — I10 Essential (primary) hypertension: Secondary | ICD-10-CM | POA: Diagnosis not present

## 2024-07-23 DIAGNOSIS — R7303 Prediabetes: Secondary | ICD-10-CM | POA: Diagnosis not present

## 2024-07-23 DIAGNOSIS — E782 Mixed hyperlipidemia: Secondary | ICD-10-CM | POA: Diagnosis not present

## 2024-07-24 ENCOUNTER — Encounter (INDEPENDENT_AMBULATORY_CARE_PROVIDER_SITE_OTHER): Payer: Self-pay | Admitting: Gastroenterology

## 2024-07-24 DIAGNOSIS — D696 Thrombocytopenia, unspecified: Secondary | ICD-10-CM | POA: Diagnosis not present

## 2024-07-24 DIAGNOSIS — R809 Proteinuria, unspecified: Secondary | ICD-10-CM | POA: Diagnosis not present

## 2024-07-24 DIAGNOSIS — I5022 Chronic systolic (congestive) heart failure: Secondary | ICD-10-CM | POA: Diagnosis not present

## 2024-07-24 DIAGNOSIS — M6281 Muscle weakness (generalized): Secondary | ICD-10-CM | POA: Diagnosis not present

## 2024-07-24 DIAGNOSIS — N1831 Chronic kidney disease, stage 3a: Secondary | ICD-10-CM | POA: Diagnosis not present

## 2024-07-24 DIAGNOSIS — M25572 Pain in left ankle and joints of left foot: Secondary | ICD-10-CM | POA: Diagnosis not present

## 2024-07-29 ENCOUNTER — Ambulatory Visit: Attending: Cardiovascular Disease | Admitting: *Deleted

## 2024-07-29 DIAGNOSIS — E782 Mixed hyperlipidemia: Secondary | ICD-10-CM | POA: Diagnosis not present

## 2024-07-29 DIAGNOSIS — Z5181 Encounter for therapeutic drug level monitoring: Secondary | ICD-10-CM | POA: Diagnosis not present

## 2024-07-29 DIAGNOSIS — Z713 Dietary counseling and surveillance: Secondary | ICD-10-CM | POA: Diagnosis not present

## 2024-07-29 DIAGNOSIS — D696 Thrombocytopenia, unspecified: Secondary | ICD-10-CM | POA: Diagnosis not present

## 2024-07-29 DIAGNOSIS — I4891 Unspecified atrial fibrillation: Secondary | ICD-10-CM | POA: Diagnosis not present

## 2024-07-29 DIAGNOSIS — M6281 Muscle weakness (generalized): Secondary | ICD-10-CM | POA: Diagnosis not present

## 2024-07-29 DIAGNOSIS — R7303 Prediabetes: Secondary | ICD-10-CM | POA: Diagnosis not present

## 2024-07-29 DIAGNOSIS — I10 Essential (primary) hypertension: Secondary | ICD-10-CM | POA: Diagnosis not present

## 2024-07-29 DIAGNOSIS — G589 Mononeuropathy, unspecified: Secondary | ICD-10-CM | POA: Diagnosis not present

## 2024-07-29 DIAGNOSIS — Z8679 Personal history of other diseases of the circulatory system: Secondary | ICD-10-CM | POA: Diagnosis not present

## 2024-07-29 DIAGNOSIS — Z79899 Other long term (current) drug therapy: Secondary | ICD-10-CM | POA: Diagnosis not present

## 2024-07-29 DIAGNOSIS — M25572 Pain in left ankle and joints of left foot: Secondary | ICD-10-CM | POA: Diagnosis not present

## 2024-07-29 LAB — POCT INR: INR: 3.8 — AB (ref 2.0–3.0)

## 2024-07-29 NOTE — Progress Notes (Signed)
 INR 3.8 Please see anticoagulation encounter

## 2024-07-29 NOTE — Patient Instructions (Signed)
 Hold warfarin tonight then decrease dose to 1 tablet daily except 1/2 tablet on Wednesdays. Recheck in 4 wks

## 2024-07-31 DIAGNOSIS — M6281 Muscle weakness (generalized): Secondary | ICD-10-CM | POA: Diagnosis not present

## 2024-07-31 DIAGNOSIS — M25572 Pain in left ankle and joints of left foot: Secondary | ICD-10-CM | POA: Diagnosis not present

## 2024-08-05 ENCOUNTER — Ambulatory Visit: Attending: Cardiovascular Disease | Admitting: Cardiovascular Disease

## 2024-08-05 VITALS — BP 96/64 | HR 64 | Ht 72.0 in | Wt 196.0 lb

## 2024-08-05 DIAGNOSIS — Z01812 Encounter for preprocedural laboratory examination: Secondary | ICD-10-CM | POA: Diagnosis not present

## 2024-08-05 DIAGNOSIS — M25572 Pain in left ankle and joints of left foot: Secondary | ICD-10-CM | POA: Diagnosis not present

## 2024-08-05 DIAGNOSIS — M6281 Muscle weakness (generalized): Secondary | ICD-10-CM | POA: Diagnosis not present

## 2024-08-05 DIAGNOSIS — I495 Sick sinus syndrome: Secondary | ICD-10-CM | POA: Diagnosis not present

## 2024-08-05 DIAGNOSIS — I480 Paroxysmal atrial fibrillation: Secondary | ICD-10-CM | POA: Diagnosis not present

## 2024-08-05 NOTE — Progress Notes (Signed)
  Electrophysiology Office Note:    Date:  08/05/2024   ID:  Alex Maddox, Alex Maddox 05-18-1951, MRN [id number]  PCP:  Shona Norleen PEDLAR, MD   [facility] Providers Cardiologist:  Jerel Balding, MD Electrophysiologist:  OLE ONEIDA HOLTS, MD     Referring MD: Nellene Quita SAUNDERS, PA   History of Present Illness:    Alex Maddox is a 73 y.o. male with a medical history significant for atrial fibrillation, tachy-bradycardia, Medtronic dual-chamber pacemaker, thoracic and abdominal aortic aneurysm referred for arrhythmia management.     History of Present Illness  He has a Medtronic dual-chamber pacemaker placed in 2012.  He has a history of neurocardiogenic syncope and continues to have syncopal episodes occasionally despite the pacemaker.  He also has a history of tachybradycardia syndrome --rates are not well-controlled and fibrillation.  When seen by Dr. Balding who in April 2023, he was in rapid atrial flutter.  He was converted from flutter to atrial fibrillation with overdrive pacing with his device.   Over the past year, his atrial fibrillation burden has been slightly higher, typically about 20%.  His rates are oftentimes not well-controlled.      Today, he reports that he is well and has no complaints. he has no device related complaints -- no new tenderness, drainage, redness.   EKGs/Labs/Other Studies Reviewed Today:     Echocardiogram:  TTE November 08, 2023 LVEF 50 to 55%.  Normal diastology.  Normal RV function degenerative mitral valve with normal function.    EKG:         Physical Exam:    VS:  BP 96/64 (BP Location: Left Arm, Patient Position: Sitting, Cuff Size: Normal)   Pulse 64   Ht 6' (1.829 m)   Wt 196 lb (88.9 kg)   SpO2 97%   BMI 26.58 kg/m     Wt Readings from Last 3 Encounters:  08/05/24 196 lb (88.9 kg)  07/01/24 196 lb (88.9 kg)  06/19/24 192 lb 12.8 oz (87.5 kg)     GEN: Well nourished, well developed in no acute distress CARDIAC:  RRR, no murmurs, rubs, gallops RESPIRATORY:  Normal work of breathing MUSCULOSKELETAL: no edema    ASSESSMENT & PLAN:     Paroxysmal atrial fibrillation and typical atrial flutter Has also had atrial flutter in the past Has been maintained on Tikosyn  250 mcg twice daily AF burden has been increasing despite Tikosyn  We discussed management options, and using the shared decision making approach decided to proceed with A-fib ablation.  I explained the rationale and logistics of the procedure.  I explained the risks including but not limited to vascular or organ injury, stroke, heart attack, death.  Secondary hypercoagulable state CHA2DS2-VASc score is 6 Continue warfarin  Medtronic dual-chamber pacemaker I reviewed today's device interrogation.  See Paceart for details The device is functioning normally   Signed, Eulas FORBES Furbish, MD  08/05/2024 11:46 AM    [facility]

## 2024-08-05 NOTE — Patient Instructions (Signed)
 Medication Instructions:  Your physician recommends that you continue on your current medications as directed. Please refer to the Current Medication list given to you today.  *If you need a refill on your cardiac medications before your next appointment, please call your pharmacy*  Testing/Procedures: Ablation Your physician has recommended that you have an ablation. Catheter ablation is a medical procedure used to treat some cardiac arrhythmias (irregular heartbeats). During catheter ablation, a long, thin, flexible tube is put into a blood vessel in your groin (upper thigh), or neck. This tube is called an ablation catheter. It is then guided to your heart through the blood vessel. Radio frequency waves destroy small areas of heart tissue where abnormal heartbeats may cause an arrhythmia to start.   You are scheduled for Atrial Fibrillation Ablation on Monday, December 1 with Dr. Dr. Nancey. Please arrive at the Main Entrance A at [facility]: [address] [place], [place] [address] at 5:30 AM   What To Expect:  Labs: you will need to have lab work drawn within 30 days of your procedure. Please go to any [facility] location to have these drawn - no appointment is needed.  You will receive procedure instructions either through MyChart or in the mail 4-6 week prior to your procedure.  After your procedure we recommend no driving for 4 days, no lifting over 5 lbs for 7 days, and no work or strenuous activity for 7 days.  Please contact our office at [phone] if you have any questions.    Follow-Up: We will contact you to schedule your post-procedure appointments.

## 2024-08-05 NOTE — Addendum Note (Signed)
 Addended by: CASIMIR ALDONA BRAVO on: 08/05/2024 11:56 AM   Modules accepted: Orders

## 2024-08-06 ENCOUNTER — Ambulatory Visit: Payer: Self-pay | Admitting: Cardiovascular Disease

## 2024-08-06 LAB — CUP PACEART INCLINIC DEVICE CHECK
Date Time Interrogation Session: 20251027115200
Implantable Lead Connection Status: 753985
Implantable Lead Connection Status: 753985
Implantable Lead Implant Date: 20120820
Implantable Lead Implant Date: 20120820
Implantable Lead Location: 753859
Implantable Lead Location: 753860
Implantable Pulse Generator Implant Date: 20220323

## 2024-08-07 DIAGNOSIS — M6281 Muscle weakness (generalized): Secondary | ICD-10-CM | POA: Diagnosis not present

## 2024-08-07 DIAGNOSIS — M25572 Pain in left ankle and joints of left foot: Secondary | ICD-10-CM | POA: Diagnosis not present

## 2024-08-13 ENCOUNTER — Telehealth: Payer: Self-pay

## 2024-08-13 ENCOUNTER — Ambulatory Visit: Attending: Cardiovascular Disease | Admitting: *Deleted

## 2024-08-13 ENCOUNTER — Other Ambulatory Visit (HOSPITAL_COMMUNITY): Payer: Self-pay | Admitting: *Deleted

## 2024-08-13 DIAGNOSIS — I4891 Unspecified atrial fibrillation: Secondary | ICD-10-CM

## 2024-08-13 DIAGNOSIS — Z5181 Encounter for therapeutic drug level monitoring: Secondary | ICD-10-CM

## 2024-08-13 LAB — POCT INR: INR: 3 (ref 2.0–3.0)

## 2024-08-13 MED ORDER — POTASSIUM CHLORIDE ER 10 MEQ PO TBCR: 10.0000 meq | EXTENDED_RELEASE_TABLET | Freq: Three times a day (TID) | ORAL | 3 refills | Status: AC

## 2024-08-13 NOTE — Progress Notes (Signed)
 INR 3.0; Please see anticoagulation encounter

## 2024-08-13 NOTE — Telephone Encounter (Signed)
-----   Message from Nurse Aldona R sent at 08/05/2024 12:05 PM EDT ----- Regarding: 12/1 730 Afib Mealor Important: list procedure date as first item in subject line, followed by procedure type (e.g., 06/21/24 AFib ablation)  Precert:  MD: Mealor Type of ablation: A-fib Diagnosis: Afib CPT code: A-fib (06343) Ablation scheduled (date/time): 12/1 730  Procedure:  Added to calendar? Yes Orders entered? No, >30 days before procedure Letter complete? No, >30 days before procedure Scheduled with cath lab? Yes Any medications to hold? Yes (please list hold instructions): Routine - WARFARIN will need to be at goal Labs ordered (CBC, BMET, PT/INR if on warfarin): Yes Mapping system: CARTO (lab 4 or 6) CARTO/OPAL rep notified? Yes Cardiac CT needed? No Dye allergy? No Pre-meds ordered and instructions given? N/A Letter method: MyChart H&P: 10/27 Device: Yes, PPM/MDT  Follow-up:  Cassie/Angel, please schedule Routine.  Covering RN - please send this message to [employer], EP scheduler, EP Scheduling pool, EP [employer], and CT scheduler (Brittany Lynch/Stephanie Mogg), if indicated.

## 2024-08-13 NOTE — Patient Instructions (Signed)
 Pending AF Ablation 09/09/24  1st pre-ablation Continue warfarin 1 tablet daily except 1/2 tablet on Wednesdays. Recheck in 1 wk

## 2024-08-16 ENCOUNTER — Encounter (HOSPITAL_COMMUNITY): Payer: Self-pay

## 2024-08-16 ENCOUNTER — Telehealth (HOSPITAL_COMMUNITY): Payer: Self-pay

## 2024-08-16 NOTE — Telephone Encounter (Signed)
 Spoke with patient to complete pre-procedure call.     Health status review:  Any new medical conditions, recent signs of acute illness or been started on antibiotics? No Any recent hospitalizations or surgeries? No Any new medications started since pre-op visit? No  Follow all medication instructions prior to procedure or the procedure may be rescheduled:    Continue taking Coumadin  (Warfarin) once daily as directed without missing any doses before procedure. Essential chronic medications:  No medication should be continued, unless told otherwise. On the morning of your procedure DO NOT take any medication., including Coumadin  (Warfarin).  Nothing to eat or drink after midnight prior to your procedure.  Pre-procedure testing scheduled: lab work by November 17.  Confirmed patient is scheduled for Atrial Fibrillation Ablation on Monday, December 1 with Dr. Nancey. Instructed patient to arrive at the Main Entrance A at [facility]: [address] [place], [place] [address] and check in at Admitting at 5:30 AM.  Plan to go home the same day, you will only stay overnight if medically necessary. You MUST have a responsible adult to drive you home and MUST be with you the first 24 hours after you arrive home or your procedure could be cancelled.  Informed a nurse may call a day before the procedure to confirm arrival time and ensure instructions are followed.  Patient verbalized understanding to information provided and is agreeable to proceed with procedure.   Advised to contact RN Navigator at [phone], to inform of any new medications started after call or concerns prior to procedure.

## 2024-08-20 ENCOUNTER — Ambulatory Visit: Attending: Cardiovascular Disease | Admitting: *Deleted

## 2024-08-20 DIAGNOSIS — I4891 Unspecified atrial fibrillation: Secondary | ICD-10-CM | POA: Diagnosis not present

## 2024-08-20 DIAGNOSIS — Z5181 Encounter for therapeutic drug level monitoring: Secondary | ICD-10-CM | POA: Diagnosis not present

## 2024-08-20 LAB — POCT INR: INR: 2.9 (ref 2.0–3.0)

## 2024-08-20 NOTE — Patient Instructions (Addendum)
 Pending AF Ablation 09/09/24  2nd pre-ablation Continue warfarin 1 tablet daily except 1/2 tablet on Wednesdays. Recheck in 1 wk

## 2024-08-22 ENCOUNTER — Encounter: Admitting: Cardiovascular Disease

## 2024-08-26 ENCOUNTER — Ambulatory Visit: Attending: Cardiovascular Disease | Admitting: *Deleted

## 2024-08-26 DIAGNOSIS — Z5181 Encounter for therapeutic drug level monitoring: Secondary | ICD-10-CM

## 2024-08-26 DIAGNOSIS — I4891 Unspecified atrial fibrillation: Secondary | ICD-10-CM

## 2024-08-26 LAB — POCT INR: INR: 3.1 — AB (ref 2.0–3.0)

## 2024-08-26 NOTE — Progress Notes (Signed)
 INR 2.9; Please see anticoagulation encounter

## 2024-08-26 NOTE — Patient Instructions (Signed)
 Pending AF Ablation 09/09/24  3re pre-ablation Continue warfarin 1 tablet daily except 1/2 tablet on Wednesdays. Recheck in 1 wk

## 2024-08-27 LAB — BASIC METABOLIC PANEL WITH GFR
BUN/Creatinine Ratio: 17 (ref 10–24)
BUN: 21 mg/dL (ref 8–27)
CO2: 26 mmol/L (ref 20–29)
Calcium: 10.1 mg/dL (ref 8.6–10.2)
Chloride: 101 mmol/L (ref 96–106)
Creatinine, Ser: 1.26 mg/dL (ref 0.76–1.27)
Glucose: 122 mg/dL — AB (ref 70–99)
Potassium: 4.4 mmol/L (ref 3.5–5.2)
Sodium: 141 mmol/L (ref 134–144)
eGFR: 61 mL/min/1.73 (ref >59)

## 2024-08-27 LAB — CBC
Hematocrit: 45.8 % (ref 37.5–51.0)
Hemoglobin: 15.2 g/dL (ref 13.0–17.7)
MCH: 32 pg (ref 26.6–33.0)
MCHC: 33.2 g/dL (ref 31.5–35.7)
MCV: 96 fL (ref 79–97)
Platelets: 113 x10E3/uL — ABNORMAL LOW (ref 150–450)
RBC: 4.75 x10E6/uL (ref 4.14–5.80)
RDW: 12.4 % (ref 11.6–15.4)
WBC: 6 x10E3/uL (ref 3.4–10.8)

## 2024-08-27 NOTE — Progress Notes (Signed)
 INR 3.1  Please see anticoagulation encounter

## 2024-09-02 ENCOUNTER — Ambulatory Visit: Attending: Cardiovascular Disease | Admitting: *Deleted

## 2024-09-02 DIAGNOSIS — I4891 Unspecified atrial fibrillation: Secondary | ICD-10-CM

## 2024-09-02 DIAGNOSIS — Z5181 Encounter for therapeutic drug level monitoring: Secondary | ICD-10-CM | POA: Diagnosis not present

## 2024-09-02 LAB — POCT INR: INR: 3 (ref 2.0–3.0)

## 2024-09-02 NOTE — Patient Instructions (Signed)
 Pending AF Ablation 09/09/24  4th pre-ablation Continue warfarin 1 tablet daily except 1/2 tablet on Wednesdays. Recheck week after ablation

## 2024-09-02 NOTE — Progress Notes (Signed)
 INR 3.0; Please see anticoagulation encounter

## 2024-09-04 ENCOUNTER — Encounter: Payer: Self-pay | Admitting: Cardiovascular Disease

## 2024-09-04 ENCOUNTER — Ambulatory Visit: Attending: Cardiovascular Disease | Admitting: Cardiovascular Disease

## 2024-09-04 VITALS — BP 130/66 | HR 60 | Ht 72.0 in | Wt 196.0 lb

## 2024-09-04 DIAGNOSIS — G4733 Obstructive sleep apnea (adult) (pediatric): Secondary | ICD-10-CM

## 2024-09-04 DIAGNOSIS — I2510 Atherosclerotic heart disease of native coronary artery without angina pectoris: Secondary | ICD-10-CM

## 2024-09-04 DIAGNOSIS — D6869 Other thrombophilia: Secondary | ICD-10-CM

## 2024-09-04 DIAGNOSIS — I5032 Chronic diastolic (congestive) heart failure: Secondary | ICD-10-CM | POA: Diagnosis not present

## 2024-09-04 DIAGNOSIS — I4891 Unspecified atrial fibrillation: Secondary | ICD-10-CM

## 2024-09-04 DIAGNOSIS — Z5181 Encounter for therapeutic drug level monitoring: Secondary | ICD-10-CM

## 2024-09-04 DIAGNOSIS — I495 Sick sinus syndrome: Secondary | ICD-10-CM

## 2024-09-04 DIAGNOSIS — I7121 Aneurysm of the ascending aorta, without rupture: Secondary | ICD-10-CM

## 2024-09-04 DIAGNOSIS — E785 Hyperlipidemia, unspecified: Secondary | ICD-10-CM

## 2024-09-04 DIAGNOSIS — I480 Paroxysmal atrial fibrillation: Secondary | ICD-10-CM

## 2024-09-04 DIAGNOSIS — Z950 Presence of cardiac pacemaker: Secondary | ICD-10-CM

## 2024-09-04 DIAGNOSIS — Z79899 Other long term (current) drug therapy: Secondary | ICD-10-CM

## 2024-09-04 DIAGNOSIS — I10 Essential (primary) hypertension: Secondary | ICD-10-CM

## 2024-09-04 DIAGNOSIS — I7143 Infrarenal abdominal aortic aneurysm, without rupture: Secondary | ICD-10-CM

## 2024-09-04 DIAGNOSIS — I700 Atherosclerosis of aorta: Secondary | ICD-10-CM

## 2024-09-04 DIAGNOSIS — R55 Syncope and collapse: Secondary | ICD-10-CM

## 2024-09-04 NOTE — Progress Notes (Unsigned)
 Patient ID: Alex Maddox, male   DOB: [birth year], 73 y.o.   MRN: [id number]    Cardiology Office Note    Date:  09/05/2024   ID:  Alex Maddox, Alex Maddox Feb 19, 1951, MRN [id number]  PCP:  Shona Norleen PEDLAR, MD  Cardiologist:   Jerel Balding, MD   Chief Complaint  Patient presents with   Atrial Fibrillation    History of Present Illness:  Alex Maddox Alex Maddox) is a 73 y.o. male with frequent paroxysmal atrial fibrillation, chronic combined systolic and diastolic heart failure, thoracic and abdominal aortic aneurysm, neurally mediated syncope, tachycardia-bradycardia syndrome and pacemaker (Medtronic Azure with 5086 leads, MRI conditional).  He is doing generally well.  He is anxious about the upcoming A-fib ablation on 09/09/2024.  His episodes of syncope have been few and far between (once or twice a year) and continue to have a pattern consistent with vasodepressor syncope with rapid recovery when laying flat.  He has not had any serious injuries and has not had any bleeding problems.  Generally unaware of palpitations, seems to have worsening fatigue with his high burden of atrial fibrillation.  INR has been in therapeutic range for the last 4 weeks.  Has not had any focal neurological complaints.  Denies chest pain or shortness of breath.   Despite treatment with dofetilide  he continues to have a high burden of A-fib in the 15-30%, frequently with RVR range as monitored by his pacemaker. He was seen by Dr. Nancey 08/05/2024 and is scheduled to undergo A-fib ablation 09/09/2024.  Otherwise pacemaker interrogation shows normal findings.  He has 50% atrial pacing and 23% ventricular pacing.  Lead parameters are excellent and estimated charge longevity is 10.4 years.  No changes are made to device settings today.  He had a follow-up CT angiogram of the aorta 02/20/2024 that showed stable diameter of the ascending aortic aneurysm at 4.2 cm but continued slow enlargement of the AAA now up to 4.0 cm  previously measured at 3.7 cm by duplex ultrasound in 2023).  Although he wears his CPAP mask every night when he goes to bed, it frequently comes off in the night and does not replace it.  He takes lots of naps during the day and falls asleep easily.  He does not wear CPAP during the daytime naps.\  Metabolic control is good with a hemoglobin A1c of 6.0%, LDL 75 but with a chronically low HDL of 32.  He has normal thyroid  function tests and liver function test.  Creatinine was most recently borderline at 1.26.  The only time he's developed overt heart failure decompensation was during a protracted episode of atrial flutter with 2: 1 AV block and tachycardia in April 2023.  Attempts at overdrive pacing led to conversion atrial fibrillation.  He was started on dofetilide  with substantial reduction in the burden of arrhythmia, back to his previous baseline of paroxysmal atrial fibrillation, roughly 10% of the time.  As before, he continues to have frequent episodes of rapid ventricular response, despite being on very high dose beta-blocker.  He has a history of neurally mediated syncope with both cardioinhibitory and vasodepressor components and received a dual-chamber Medtronic MRI conditional permanent pacemaker in August 2012. He has a history of paroxysmal atrial fibrillation and a history of TIA at age 5. He had a traumatic right occipital intracerebral hemorrhage while on warfarin anticoagulation in November 2016. He also has thoracic ascending aortic aneurysm, infrarenal AAA, systemic hypertension and hyperlipidemia. Normal nuclear stress test perfusion pattern  in 2012, EF 43%; similar EF by echocardiography at that time, improved LVEF 50-55% in Jan 2025 by echo. Only had clinical heart failure during atrial flutter with sustained RVR. SABRA He has ascending aortic enlargement (2022 - 4.1 cm) and fusiform ectasia of the infrarenal abdominal aorta (2022 - 3.4 cm) and CT evidence of coronary  calcification.  He developed congestive heart failure during episode of protracted persistent typical atrial flutter with 2: 1 AV conduction in April 2023.  Overdrive pacing was unsuccessful at termination.  Was admitted for dofetilide  loading with conversion to sinus/atrial paced rhythm and resolution of heart failure.  Past Medical History:  Diagnosis Date   Aortic atherosclerosis    Arthritis of spine    Ascending aortic aneurysm    BPH (benign prostatic hyperplasia)    CAD (coronary artery disease)    Cerebral microvascular disease    CKD (chronic kidney disease), stage III (HCC)    Combined systolic and diastolic congestive heart failure (HCC)    DDD (degenerative disc disease), cervical    a.) s/p C5-C6 ACDF   DDD (degenerative disc disease), lumbar    ED (erectile dysfunction)    History of loop recorder (has been removed)    History of multiple strokes    Hyperlipidemia    Hypertension    Infrarenal abdominal aortic aneurysm (AAA) without rupture    Intracranial hemorrhage (HCC)    Neurocardiogenic syncope    last syncopal episode was 01-2024   Paroxysmal atrial fibrillation (HCC)    a.) CHA2DS2VASc = 6 (age, CHF, HTN, CVA/TIA x 2, vascular disease) as of 05/20/2024; b.) rate/rhythm maintained on oral dofetilide  + carvedilol ; chronically anticoagulated with warfarin   Penetrating atherosclerotic ulcer of aorta    Peroneal neuropathy, left    Peroneal neuropathy, left    Pre-diabetes    Presence of permanent cardiac pacemaker 05/31/2011   a.) MDT Revo placed 05/31/2011 --> ERI 09/17/2021; b.) PPM generator changed 12/30/2020 --> Medtronic Azure XT DR [id number] (SN: [id number])   RBBB (right bundle branch block)    Sepsis (HCC)    Sinus node dysfunction (HCC)    Sleep apnea, obstructive    cannot tolerate cpap-sleep study 10/09/2007  AHI 5.73/hr  REM AHI 11.90/hr   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   LEFT THUMB    Tachycardia-bradycardia (HCC)    a.) s/p PPM  05/31/2011   Thrombocytopenia    Transient ischemic attack (TIA) 03/2006   Warfarin anticoagulation     Past Surgical History:  Procedure Laterality Date   ANTERIOR CERVICAL DECOMP/DISCECTOMY FUSION     Procedure: ACDF C5-C6   COLONOSCOPY     COLONOSCOPY N/A 06/07/2017   Procedure: COLONOSCOPY;  Surgeon: [name], MD;  [facility]: [facility];  Service: Endoscopy;  Laterality: N/A;  9:30   COLONOSCOPY N/A 09/20/2018   Procedure: COLONOSCOPY;  Surgeon: [name], MD;  [facility]: [facility];  Service: Endoscopy;  Laterality: N/A;  1030   COLONOSCOPY WITH PROPOFOL  N/A 11/17/2023   Procedure: COLONOSCOPY WITH PROPOFOL ;  Surgeon: [name], MD;  Location: [facility];  Service: Gastroenterology;  Laterality: N/A;  9:15AM;ASA 3   LOOP RECORDER IMPLANT  02/02/2011   Medtronic- CRM    LOOP RECORDER REMOVAL     PACEMAKER INSERTION  05/30/2011   Medtronic Revo   PERONEAL NERVE DECOMPRESSION Left 05/21/2024   Procedure: PERONEAL NERVE DECOMPRESSION;  Surgeon: Claudene Penne ORN, MD;  Location: [facility];  Service: Neurosurgery;  Laterality: Left;  OPEN LEFT PERONEAL NERVE DECOMPRESSION   POLYPECTOMY  09/20/2018   Procedure: POLYPECTOMY;  Surgeon: [name], MD;  [facility]: [facility];  Service: Endoscopy;;  colon   POLYPECTOMY  11/17/2023   Procedure: POLYPECTOMY;  Surgeon: [name], MD;  Location: [facility];  Service: Gastroenterology;;   [facility] GENERATOR CHANGEOUT N/A 12/30/2020   Procedure: PPM GENERATOR CHANGEOUT;  Surgeon: Francyne Headland, MD;  Location: [facility];  Service: Cardiovascular;  Laterality: N/A;   WISDOM TOOTH EXTRACTION      Outpatient Medications Prior to Visit  Medication Sig Dispense Refill   carvedilol  (COREG ) 25 MG tablet Take 2 tablets (50 mg total) by mouth 2 (two) times daily with a meal. 360 tablet 3   Cyanocobalamin  (B-12 PO) Take 1,000 mcg by mouth daily.     diazepam (VALIUM) 2 MG tablet Take 2  mg by mouth every 8 (eight) hours as needed for anxiety.      dofetilide  (TIKOSYN ) 250 MCG capsule TAKE ONE (1) CAPSULE BY MOUTH TWICE A DAY. (EVERY 12 HOURS.) 60 capsule 6   finasteride  (PROSCAR ) 5 MG tablet Take 5 mg by mouth every morning.     furosemide  (LASIX ) 20 MG tablet Take 1 tablet (20 mg total) by mouth daily as needed for fluid or edema (20 MG daily as needed for swelling- take potassium when you take this). 90 tablet 3   irbesartan  (AVAPRO ) 75 MG tablet Take 1 tablet (75 mg total) by mouth daily. 90 tablet 3   magnesium  gluconate (MAGONATE) 500 (27 Mg) MG TABS tablet Take 500 mg by mouth daily.     Multiple Vitamin (MULTIVITAMIN WITH MINERALS) TABS tablet Take 1 tablet by mouth daily. Adult Multivitamin 50+     OVER THE COUNTER MEDICATION Take 1 tablet by mouth at bedtime. Equate Brain Health Supplement     Polyethyl Glycol-Propyl Glycol 0.4-0.3 % SOLN Place 1 drop into both eyes 3 (three) times daily as needed (for dry eyes.).     potassium chloride  (KLOR-CON ) 10 MEQ tablet Take 1 tablet (10 mEq total) by mouth 3 (three) times daily. (Patient taking differently: Take 10 mEq by mouth 3 (three) times daily as needed (When taking Furosemide ).) 180 tablet 3   rosuvastatin  (CRESTOR ) 20 MG tablet TAKE ONE TABLET (20MG  TOTAL) BY MOUTH DAILY 90 tablet 2   Saw Palmetto 450 MG CAPS Take 900 mg by mouth daily.     trospium (SANCTURA) 20 MG tablet Take 20 mg by mouth at bedtime.     warfarin (COUMADIN ) 5 MG tablet TAKE ONE-HALF TO ONE TABLET BY MOUTH EVERY DAY AS DIRECTED BY COUMADIN  CLINIC (Patient taking differently: Take 2.5-5 mg by mouth See admin instructions. Take 5 mg every day EXCEPT: Wednesday take 2.5 mg  AS DIRECTED BY COUMADIN  CLINIC) 90 tablet 1   No facility-administered medications prior to visit.     Allergies:   Prevnar 13 [pneumococcal 13-val conj vacc]    Family History:  The patient's family history includes Heart attack (age of onset: 57) in his father; Hypertension in  his brother and mother.   PHYSICAL EXAM:   VS:  BP 130/66 (BP Location: Left Arm, Patient Position: Sitting)   Pulse 60   Ht 6' (1.829 m)   Wt 196 lb (88.9 kg)   SpO2 99%   BMI 26.58 kg/m      General: Alert, oriented x3, no distress, minimally overweight.  Healthy left subclavian pacemaker site. Head: no evidence of trauma, PERRL, EOMI,  no exophtalmos or lid lag, no myxedema, no xanthelasma; normal ears, nose and oropharynx Neck: normal jugular venous pulsations and no hepatojugular reflux; brisk carotid pulses without delay and no carotid bruits Chest: clear to auscultation, no signs of consolidation by percussion or palpation, normal fremitus, symmetrical and full respiratory excursions Cardiovascular: normal position and quality of the apical impulse, regular rhythm, normal first and widely split second heart sounds, no murmurs, rubs or gallops Abdomen: no tenderness or distention, no masses by palpation, no abnormal pulsatility or arterial bruits, normal bowel sounds, no hepatosplenomegaly Extremities: no clubbing, cyanosis or edema; 2+ radial, ulnar and brachial pulses bilaterally; 2+ right femoral, posterior tibial and dorsalis pedis pulses; 2+ left femoral, posterior tibial and dorsalis pedis pulses; no subclavian or femoral bruits Neurological: grossly nonfocal Psych: Normal mood and affect   Wt Readings from Last 3 Encounters:  09/04/24 196 lb (88.9 kg)  08/05/24 196 lb (88.9 kg)  07/01/24 196 lb (88.9 kg)      Studies/Labs Reviewed:   EKG:    EKG Interpretation Date/Time:  Wednesday September 04 2024 08:45:32 EST Ventricular Rate:  60 PR Interval:  206 QRS Duration:  136 QT Interval:  438 QTC Calculation: 438 R Axis:   34  Text Interpretation: Atrial-paced rhythm Right bundle branch block When compared with ECG of 19-Jun-2024 10:48, T wave inversion now evident in Anterior leads Confirmed by Virgin Zellers ([id number]) on 09/04/2024 8:46:29 AM         CTA  02/19/2024: Interval increase in size of an L4 0.0 cm infrarenal abdominal aortic aneurysm. Recommend follow-up in 12 months and referral to a vascular specialist.   There is now a 8 mm penetrating ulcer of the infrarenal aorta below the abdominal aortic aneurysm sac. Again follow up with a vascular specialist is recommended.   Stable dilatation of the ascending thoracic aorta measuring 4.2 cm.  AAA duplex ultrasound 04/21/2023:  Abdominal Aorta: There is evidence of abnormal dilatation of the mid and  distal Abdominal aorta. The largest aortic measurement is 3.5 cm. The  largest aortic diameter remains essentially unchanged compared to prior  exam. Previous diameter measurement was   3.7 cm obtained on 04/20/22.  IVC/Iliac: There is no evidence of thrombus involving the IVC.   BMET    Component Value Date/Time   NA 141 08/26/2024 1506   K 4.4 08/26/2024 1506   CL 101 08/26/2024 1506   CO2 26 08/26/2024 1506   GLUCOSE 122 (H) 08/26/2024 1506   GLUCOSE 73 04/26/2024 1104   BUN 21 08/26/2024 1506   CREATININE 1.26 08/26/2024 1506   CREATININE 1.19 01/16/2017 0846   CALCIUM  10.1 08/26/2024 1506   GFRNONAA 59 (L) 04/26/2024 1104   GFRAA 75 08/09/2019 0912   Lipid Panel     Component Value Date/Time   CHOL 123 05/04/2021 0801   TRIG 71 05/04/2021 0801   HDL 37 (L) 05/04/2021 0801   CHOLHDL 3.3 05/04/2021 0801   LDLCALC 71 05/04/2021 0801   LABVLDL 15 05/04/2021 0801     ASSESSMENT:    1. Paroxysmal atrial fibrillation (HCC)   2. Tachycardia-bradycardia syndrome (HCC)   3. Chronic heart failure with preserved ejection fraction (HFpEF) (HCC)   4. Acquired thrombophilia   5. Encounter for monitoring dofetilide  therapy   6. Neurocardiogenic syncope   7. Pacemaker   8. Aneurysm of ascending aorta without rupture   9. Infrarenal abdominal aortic aneurysm (AAA) without rupture   10. Essential hypertension   11. Coronary artery calcification seen  on CT scan   12.  Atherosclerosis of aorta   13. Dyslipidemia (high LDL; low HDL)   14. OSA (obstructive sleep apnea)       PLAN:  In order of problems listed above:  CHF: NYHA functional class I, clinically euvolemic.  His reduced LV systolic function has always been asymptomatic and he appears to be clinically euvolemic.   Most recent LVEF improved at 50-55%.  Prefer to avoid diuretics due to his history of neurally mediated syncope.  He only had heart failure during his episode of persistent atrial flutter with rapid ventricular response.  He is on a very high dose of carvedilol  to help with ventricular rate control.  He will not tolerate Entresto.  We have had to decrease the dose of irbesartan .  We have discussed the use of Farxiga or Jardiance, but cost is a big issue and dehydration from these agents could worsen his risk of syncope.  PAFib: Initially had a decent response to dofetilide  and carvedilol , but now has very high burden of arrhythmia.  Also hard to rate control despite using very high doses of beta-blocker.  Scheduled for A-fib ablation 09/09/2024 [facility] Vasc 6 (age, TIA 2, HTN, vascular disease, CHF).   Tachy-brady sd: He is not pacemaker dependent.  The device has made substantial impact on his episodes of vasovagal syncope, although these still occur occasionally.. Warfarin: INR therapeutic range for the last 4 weeks preceding his upcoming ablation. Dofetilide : QTc in safe range.  Periodic monitoring of potassium and renal function. Neurally mediated syncope: Still occurs roughly once or twice a year despite the pacemaker.  A variety of different triggers such as increased emotion at a funeral or clipping his toenails. PPM: Normal device function.  Continue remote downloads every 3 months. Asc Ao aneurysm: Unchanged in size over many years.  Plan to repeat a coronary CT angiogram in 3 years. AAA: Asymptomatic but steadily increasing in size, now up to 4.0 cm with a neighboring small penetrating  ulcer.  Will monitor this with ultrasound in 6 months and he will soon probably require referral to vascular surgery.  Discussed the option for treatment with a stent graft. HTN: Blood pressure is higher than usual for him today.  Just a few weeks ago his blood pressure was in the 96/60 range.  He is on a very low-dose of ARB because of this.  He will not tolerate Entresto. Coronary Ca: Has never complained of angina has not had any documented coronary events although he has extensive coronary and aortic calcification by imaging studies..  He had several normal nuclear stress tests in the past (most recently 2012).  The focus is on risk factor management.  On statin.   HLP: LDL just slightly above target of 70, HDL is chronically low.  He is mildly overweight and is hemoglobin A1c is in prediabetes range.  We have discussed avoiding sweets and starches with high glycemic index. OSA: Have not yet found a satisfactory solution to ensure compliance with CPAP.  Will refer him to Dr. Wilbert Bihari to discuss other possible interventions. Lumbar spine disease: He has relatively mild disc bulging at L2-L3 and L5-S1 and recess and foraminal narrowing bilateral L4-L5 without benefit nerve root encroachment by MRI 2022.  Lumbar spine disease and cannot have MRIs since his pacemaker and leads are MRI conditional.  Medication Adjustments/Labs and Tests Ordered: Current medicines are reviewed at length with the patient today.  Concerns regarding medicines are outlined above.  Medication changes, Labs  and Tests ordered today are listed in the Patient Instructions below. Patient Instructions  Medication Instructions:  No changes *If you need a refill on your cardiac medications before your next appointment, please call your pharmacy*  Lab Work: None ordered If you have labs (blood work) drawn today and your tests are completely normal, you will receive your results only by: MyChart Message (if you have MyChart)  OR A paper copy in the mail If you have any lab test that is abnormal or we need to change your treatment, we will call you to review the results.  Testing/Procedures: Your physician has requested that you have an abdominal aorta duplex in May 2026. During this test, an ultrasound is used to evaluate the aorta. Allow 30 minutes for this exam. Do not eat after midnight the day before and avoid carbonated beverages.  Please note: We ask at that you not bring children with you during ultrasound (echo/ vascular) testing. Due to room size and safety concerns, children are not allowed in the ultrasound rooms during exams. Our front office staff cannot provide observation of children in our lobby area while testing is being conducted. An adult accompanying a patient to their appointment will only be allowed in the ultrasound room at the discretion of the ultrasound technician under special circumstances. We apologize for any inconvenience.   Follow-Up: At [facility], you and your health needs are our priority.  As part of our continuing mission to provide you with exceptional heart care, our providers are all part of one team.  This team includes your primary Cardiologist (physician) and Advanced Practice Providers or APPs (Physician Assistants and Nurse Practitioners) who all work together to provide you with the care you need, when you need it.  Your next appointment:    6 months- schedule after the Abdominal Aorta Duplex  Provider:   Jerel Balding, MD    We recommend signing up for the patient portal called MyChart.  Sign up information is provided on this After Visit Summary.  MyChart is used to connect with patients for Virtual Visits (Telemedicine).  Patients are able to view lab/test results, encounter notes, upcoming appointments, etc.  Non-urgent messages can be sent to your provider as well.   To learn more about what you can do with MyChart, go to [email or web address].         Signed, Jerel Balding, MD  09/05/2024 11:45 AM    [facility] [address] [place], [place], [place]  [address] Phone: [phone]; Fax: [phone]

## 2024-09-04 NOTE — Patient Instructions (Signed)
 Medication Instructions:  No changes *If you need a refill on your cardiac medications before your next appointment, please call your pharmacy*  Lab Work: None ordered If you have labs (blood work) drawn today and your tests are completely normal, you will receive your results only by: MyChart Message (if you have MyChart) OR A paper copy in the mail If you have any lab test that is abnormal or we need to change your treatment, we will call you to review the results.  Testing/Procedures: Your physician has requested that you have an abdominal aorta duplex in May 2026. During this test, an ultrasound is used to evaluate the aorta. Allow 30 minutes for this exam. Do not eat after midnight the day before and avoid carbonated beverages.  Please note: We ask at that you not bring children with you during ultrasound (echo/ vascular) testing. Due to room size and safety concerns, children are not allowed in the ultrasound rooms during exams. Our front office staff cannot provide observation of children in our lobby area while testing is being conducted. An adult accompanying a patient to their appointment will only be allowed in the ultrasound room at the discretion of the ultrasound technician under special circumstances. We apologize for any inconvenience.   Follow-Up: At [facility], you and your health needs are our priority.  As part of our continuing mission to provide you with exceptional heart care, our providers are all part of one team.  This team includes your primary Cardiologist (physician) and Advanced Practice Providers or APPs (Physician Assistants and Nurse Practitioners) who all work together to provide you with the care you need, when you need it.  Your next appointment:    6 months- schedule after the Abdominal Aorta Duplex  Provider:   Jerel Balding, MD    We recommend signing up for the patient portal called MyChart.  Sign up information is provided on this After  Visit Summary.  MyChart is used to connect with patients for Virtual Visits (Telemedicine).  Patients are able to view lab/test results, encounter notes, upcoming appointments, etc.  Non-urgent messages can be sent to your provider as well.   To learn more about what you can do with MyChart, go to [email or web address].

## 2024-09-06 NOTE — Pre-Procedure Instructions (Signed)
Instructed patient on the following items: Arrival time 0515 Nothing to eat or drink after midnight No meds AM of procedure Responsible person to drive you home and stay with you for 24 hrs  Have you missed any doses of anti-coagulant Coumadin- takes once a day, hasn't missed any doses.

## 2024-09-09 ENCOUNTER — Ambulatory Visit (HOSPITAL_COMMUNITY)
Admission: RE | Disposition: A | Discharge status: Home / Self Care | Payer: Self-pay | Source: Home / Self Care | Attending: Cardiovascular Disease

## 2024-09-09 ENCOUNTER — Other Ambulatory Visit: Payer: Self-pay

## 2024-09-09 ENCOUNTER — Ambulatory Visit (HOSPITAL_BASED_OUTPATIENT_CLINIC_OR_DEPARTMENT_OTHER): Admitting: Registered Nurse

## 2024-09-09 ENCOUNTER — Encounter (HOSPITAL_COMMUNITY): Admitting: Registered Nurse

## 2024-09-09 ENCOUNTER — Ambulatory Visit (HOSPITAL_COMMUNITY)
Admission: RE | Admit: 2024-09-09 | Discharge: 2024-09-09 | Disposition: A | Discharge status: Home / Self Care | Attending: Cardiovascular Disease | Admitting: Cardiovascular Disease

## 2024-09-09 DIAGNOSIS — Z950 Presence of cardiac pacemaker: Secondary | ICD-10-CM | POA: Insufficient documentation

## 2024-09-09 DIAGNOSIS — G4730 Sleep apnea, unspecified: Secondary | ICD-10-CM | POA: Insufficient documentation

## 2024-09-09 DIAGNOSIS — D6869 Other thrombophilia: Secondary | ICD-10-CM | POA: Diagnosis not present

## 2024-09-09 DIAGNOSIS — I4891 Unspecified atrial fibrillation: Secondary | ICD-10-CM | POA: Diagnosis not present

## 2024-09-09 DIAGNOSIS — I480 Paroxysmal atrial fibrillation: Secondary | ICD-10-CM | POA: Insufficient documentation

## 2024-09-09 DIAGNOSIS — Z79899 Other long term (current) drug therapy: Secondary | ICD-10-CM | POA: Diagnosis not present

## 2024-09-09 DIAGNOSIS — Z01818 Encounter for other preprocedural examination: Secondary | ICD-10-CM

## 2024-09-09 DIAGNOSIS — G4733 Obstructive sleep apnea (adult) (pediatric): Secondary | ICD-10-CM | POA: Diagnosis not present

## 2024-09-09 DIAGNOSIS — Z7901 Long term (current) use of anticoagulants: Secondary | ICD-10-CM | POA: Diagnosis not present

## 2024-09-09 DIAGNOSIS — Z8673 Personal history of transient ischemic attack (TIA), and cerebral infarction without residual deficits: Secondary | ICD-10-CM | POA: Diagnosis not present

## 2024-09-09 DIAGNOSIS — I483 Typical atrial flutter: Secondary | ICD-10-CM | POA: Diagnosis not present

## 2024-09-09 DIAGNOSIS — I10 Essential (primary) hypertension: Secondary | ICD-10-CM | POA: Insufficient documentation

## 2024-09-09 DIAGNOSIS — E785 Hyperlipidemia, unspecified: Secondary | ICD-10-CM

## 2024-09-09 HISTORY — PX: ATRIAL FIBRILLATION ABLATION: EP1191

## 2024-09-09 LAB — PROTIME-INR
INR: 2.7 — ABNORMAL HIGH (ref 0.8–1.2)
Prothrombin Time: 29.6 s — ABNORMAL HIGH (ref 11.4–15.2)

## 2024-09-09 LAB — POCT ACTIVATED CLOTTING TIME: Activated Clotting Time: 425 s

## 2024-09-09 SURGERY — ATRIAL FIBRILLATION ABLATION
Anesthesia: General

## 2024-09-09 MED ORDER — ACETAMINOPHEN 325 MG PO TABS: 650.0000 mg | ORAL_TABLET | ORAL | Status: DC | PRN

## 2024-09-09 MED ORDER — PROTAMINE SULFATE 10 MG/ML IV SOLN
INTRAVENOUS | Status: DC | PRN
  Administered 2024-09-09: 50 mg via INTRAVENOUS

## 2024-09-09 MED ORDER — FENTANYL CITRATE (PF) 100 MCG/2ML IJ SOLN
INTRAMUSCULAR | Status: AC
  Filled 2024-09-09: qty 2

## 2024-09-09 MED ORDER — SUGAMMADEX SODIUM 200 MG/2ML IV SOLN
INTRAVENOUS | Status: DC | PRN
  Administered 2024-09-09: 300 mg via INTRAVENOUS

## 2024-09-09 MED ORDER — ONDANSETRON HCL 4 MG/2ML IJ SOLN
INTRAMUSCULAR | Status: DC | PRN
  Administered 2024-09-09: 4 mg via INTRAVENOUS

## 2024-09-09 MED ORDER — LIDOCAINE 2% (20 MG/ML) 5 ML SYRINGE
INTRAMUSCULAR | Status: DC | PRN
  Administered 2024-09-09: 100 mg via INTRAVENOUS

## 2024-09-09 MED ORDER — CEFAZOLIN SODIUM-DEXTROSE 1-4 GM/50ML-% IV SOLN
2.0000 g | Freq: Once | INTRAVENOUS | Status: AC
  Administered 2024-09-09: 2 g via INTRAVENOUS

## 2024-09-09 MED ORDER — PHENYLEPHRINE 80 MCG/ML (10ML) SYRINGE FOR IV PUSH (FOR BLOOD PRESSURE SUPPORT)
PREFILLED_SYRINGE | INTRAVENOUS | Status: DC | PRN
  Administered 2024-09-09 (×2): 80 ug via INTRAVENOUS

## 2024-09-09 MED ORDER — ATROPINE SULFATE 1 MG/10ML IJ SOSY
PREFILLED_SYRINGE | INTRAMUSCULAR | Status: DC | PRN
  Administered 2024-09-09: 1 mg via INTRAVENOUS

## 2024-09-09 MED ORDER — ONDANSETRON HCL 4 MG/2ML IJ SOLN: 4.0000 mg | Freq: Four times a day (QID) | INTRAMUSCULAR | Status: DC | PRN

## 2024-09-09 MED ORDER — EPHEDRINE SULFATE-NACL 50-0.9 MG/10ML-% IV SOSY
PREFILLED_SYRINGE | INTRAVENOUS | Status: DC | PRN
  Administered 2024-09-09: 10 mg via INTRAVENOUS
  Administered 2024-09-09: 5 mg via INTRAVENOUS

## 2024-09-09 MED ORDER — SODIUM CHLORIDE 0.9 % IV SOLN: 250.0000 mL | INTRAVENOUS | Status: DC | PRN

## 2024-09-09 MED ORDER — PHENYLEPHRINE HCL-NACL 20-0.9 MG/250ML-% IV SOLN
INTRAVENOUS | Status: DC | PRN
  Administered 2024-09-09: 25 ug/min via INTRAVENOUS

## 2024-09-09 MED ORDER — HEPARIN SODIUM (PORCINE) 1000 UNIT/ML IJ SOLN
INTRAMUSCULAR | Status: DC | PRN
  Administered 2024-09-09: 15000 [IU] via INTRAVENOUS

## 2024-09-09 MED ORDER — DEXAMETHASONE SOD PHOSPHATE PF 10 MG/ML IJ SOLN
INTRAMUSCULAR | Status: DC | PRN
  Administered 2024-09-09: 10 mg via INTRAVENOUS

## 2024-09-09 MED ORDER — ROCURONIUM BROMIDE 10 MG/ML (PF) SYRINGE
PREFILLED_SYRINGE | INTRAVENOUS | Status: DC | PRN
  Administered 2024-09-09: 50 mg via INTRAVENOUS
  Administered 2024-09-09: 20 mg via INTRAVENOUS

## 2024-09-09 MED ORDER — PROPOFOL 10 MG/ML IV BOLUS
INTRAVENOUS | Status: DC | PRN
  Administered 2024-09-09: 50 mg via INTRAVENOUS
  Administered 2024-09-09: 150 mg via INTRAVENOUS

## 2024-09-09 MED ORDER — SODIUM CHLORIDE 0.9% FLUSH: 3.0000 mL | INTRAVENOUS | Status: DC | PRN

## 2024-09-09 MED ORDER — CEFAZOLIN SODIUM-DEXTROSE 2-4 GM/100ML-% IV SOLN
INTRAVENOUS | Status: DC
  Filled 2024-09-09: qty 100

## 2024-09-09 MED ORDER — FENTANYL CITRATE (PF) 250 MCG/5ML IJ SOLN
INTRAMUSCULAR | Status: DC | PRN
  Administered 2024-09-09 (×2): 50 ug via INTRAVENOUS

## 2024-09-09 MED ORDER — SODIUM CHLORIDE 0.9 % IV SOLN: INTRAVENOUS | Status: DC

## 2024-09-09 SURGICAL SUPPLY — 19 items
BLANKET WARM UNDERBOD FULL ACC (MISCELLANEOUS) ×1 IMPLANT
CABLE FARASTAR GEN2 SNGL USE (CABLE) IMPLANT
CATH EZ STEER NAV 8MM D-F CUR (ABLATOR) IMPLANT
CATH FARAWAVE 2.0 31 (CATHETERS) IMPLANT
CATH GE 8FR SOUNDSTAR (CATHETERS) IMPLANT
CATH OCTARAY 2.0 F 3-3-3-3-3 (CATHETERS) IMPLANT
CATH WEB BI DIR CSDF CRV REPRO (CATHETERS) IMPLANT
CLOSURE PERCLOSE PROSTYLE (Vascular Products) IMPLANT
COVER SWIFTLINK CONNECTOR (BAG) ×1 IMPLANT
DEVICE CLOSURE MYNXGRIP 6/7F (Vascular Products) IMPLANT
DILATOR VESSEL 38 20CM 16FR (INTRODUCER) IMPLANT
GUIDEWIRE INQWIRE 1.5J.035X260 (WIRE) IMPLANT
KIT VERSACROSS CNCT FARADRIVE (KITS) IMPLANT
PACK EP LF (CUSTOM PROCEDURE TRAY) ×1 IMPLANT
PAD DEFIB RADIO PHYSIO CONN (PAD) ×1 IMPLANT
PATCH CARTO3 (PAD) IMPLANT
SHEATH FARADRIVE STEERABLE (SHEATH) IMPLANT
SHEATH PINNACLE 8F 10CM (SHEATH) IMPLANT
SHEATH PINNACLE 9F 10CM (SHEATH) IMPLANT

## 2024-09-09 NOTE — H&P (Signed)
  Electrophysiology Office Note:    Date:  09/09/2024   ID:  Alex Maddox, Alex Maddox Alex Maddox, MRN [id number]  PCP:  Alex Norleen PEDLAR, MD   [facility] Providers Cardiologist:  Alex Balding, MD Electrophysiologist:  Alex ONEIDA HOLTS, MD     Referring MD: No ref. provider found   History of Present Illness:    Alex Maddox is a 73 y.o. male with a medical history significant for atrial fibrillation, tachy-bradycardia, Medtronic dual-chamber pacemaker, thoracic and abdominal aortic aneurysm referred for arrhythmia management.     History of Present Illness  He has a Medtronic dual-chamber pacemaker placed in 2012.  He has a history of neurocardiogenic syncope and continues to have syncopal episodes occasionally despite the pacemaker.  He also has a history of tachybradycardia syndrome --rates are not well-controlled and fibrillation.  When seen by Dr. Balding who in April 2023, he was in rapid atrial flutter.  He was converted from flutter to atrial fibrillation with overdrive pacing with his device.   Over the past year, his atrial fibrillation burden has been slightly higher, typically about 20%.  His rates are oftentimes not well-controlled.      Today, he reports that he is well and has no complaints. he has no device related complaints -- no new tenderness, drainage, redness.   I reviewed the patient's labs. His INR has been consistently therapeutic, and was 2.7 this morning. There have been no changes in the patient's diagnoses, medications, or condition since our recent clinic visit.   EKGs/Labs/Other Studies Reviewed Today:     Echocardiogram:  TTE November 08, 2023 LVEF 50 to 55%.  Normal diastology.  Normal RV function degenerative mitral valve with normal function.    EKG:         Physical Exam:    VS:  BP (!) 170/98   Pulse 80   Temp 98.5 F (36.9 C) (Oral)   Resp 14   Ht 6' (1.829 m)   Wt 86.2 kg   SpO2 98%   BMI 25.77 kg/m     Wt Readings  from Last 3 Encounters:  09/09/24 86.2 kg  09/04/24 88.9 kg  08/05/24 88.9 kg     GEN: Well nourished, well developed in no acute distress CARDIAC: RRR, no murmurs, rubs, gallops RESPIRATORY:  Normal work of breathing MUSCULOSKELETAL: no edema    ASSESSMENT & PLAN:     Paroxysmal atrial fibrillation and typical atrial flutter Has also had atrial flutter in the past Has been maintained on Tikosyn  250 mcg twice daily AF burden has been increasing despite Tikosyn  We discussed management options, and using the shared decision making approach decided to proceed with A-fib ablation.  I explained the rationale and logistics of the procedure.  I explained the risks including but not limited to vascular or organ injury, stroke, heart attack, death.  Secondary hypercoagulable state CHA2DS2-VASc score is 6 Continue warfarin  Medtronic dual-chamber pacemaker I reviewed today's device interrogation.  See Paceart for details The device is functioning normally   Signed, Alex FORBES Furbish, MD  09/09/2024 6:51 AM    [facility]

## 2024-09-09 NOTE — Discharge Instructions (Signed)

## 2024-09-09 NOTE — Anesthesia Procedure Notes (Signed)
 Procedure Name: Intubation Date/Time: 09/09/2024 7:52 AM  Performed by: Christopher Comings, CRNAPre-anesthesia Checklist: Patient identified, Emergency Drugs available, Suction available and Patient being monitored Patient Re-evaluated:Patient Re-evaluated prior to induction Oxygen Delivery Method: Circle system utilized Preoxygenation: Pre-oxygenation with 100% oxygen Induction Type: IV induction Ventilation: Mask ventilation without difficulty Laryngoscope Size: Mac and 4 Grade View: Grade III Tube type: Oral Tube size: 7.5 mm Number of attempts: 1 Airway Equipment and Method: Stylet and Oral airway Placement Confirmation: ETT inserted through vocal cords under direct vision, positive ETCO2 and breath sounds checked- equal and bilateral Secured at: 23 cm Tube secured with: Tape Dental Injury: Teeth and Oropharynx as per pre-operative assessment

## 2024-09-09 NOTE — Anesthesia Postprocedure Evaluation (Signed)
 Anesthesia Post Note  Patient: Alex Maddox  Procedure(s) Performed: ATRIAL FIBRILLATION ABLATION     Patient location during evaluation: Cath Lab Anesthesia Type: General Level of consciousness: awake Pain management: pain level controlled Vital Signs Assessment: post-procedure vital signs reviewed and stable Respiratory status: spontaneous breathing, nonlabored ventilation and respiratory function stable Cardiovascular status: blood pressure returned to baseline and stable Postop Assessment: no apparent nausea or vomiting Anesthetic complications: no   There were no known notable events for this encounter.  Last Vitals:  Vitals:   09/09/24 1234 09/09/24 1300  BP: 137/77 (!) 145/87  Pulse: 64 62  Resp: 19 (!) 23  Temp:    SpO2: 97% 99%    Last Pain:  Vitals:   09/09/24 1011  TempSrc:   PainSc: 0-No pain                 Johnesha Acheampong P Chanley Mcenery

## 2024-09-09 NOTE — Anesthesia Preprocedure Evaluation (Addendum)
 Anesthesia Evaluation  Patient identified by MRN, date of birth, ID band Patient awake    Reviewed: Allergy & Precautions, NPO status , Patient's Chart, lab work & pertinent test results  Airway Mallampati: III  TM Distance: >3 FB Neck ROM: Full    Dental no notable dental hx.    Pulmonary sleep apnea and Continuous Positive Airway Pressure Ventilation    Pulmonary exam normal        Cardiovascular hypertension, Pt. on medications and Pt. on home beta blockers Normal cardiovascular exam+ dysrhythmias Atrial Fibrillation + pacemaker   ECHO: 1. Left ventricular ejection fraction, by estimation, is 50 to 55%. The  left ventricle has low normal function. The left ventricle has no regional  wall motion abnormalities. Left ventricular diastolic parameters were  normal. The average left ventricular   global longitudinal strain is -11.6 %. The global longitudinal strain is  abnormal.   2. Right ventricular systolic function is normal. The right ventricular  size is normal. There is normal pulmonary artery systolic pressure.   3. The mitral valve is degenerative. No evidence of mitral valve  regurgitation. No evidence of mitral stenosis.   4. The aortic valve is tricuspid. Aortic valve regurgitation is trivial.  Aortic valve sclerosis is present, with no evidence of aortic valve  stenosis.   5. Aortic dilatation noted. There is dilatation of the ascending aorta,  measuring 41 mm.   6. The inferior vena cava is normal in size with greater than 50%  respiratory variability, suggesting right atrial pressure of 3 mmHg.     Neuro/Psych TIA   GI/Hepatic negative GI ROS, Neg liver ROS,,,  Endo/Other  negative endocrine ROS    Renal/GU Renal disease     Musculoskeletal   Abdominal   Peds  Hematology  (+) Blood dyscrasia (Coumadin )   Anesthesia Other Findings A-fib  Reproductive/Obstetrics                               Anesthesia Physical Anesthesia Plan  ASA: 3  Anesthesia Plan: General   Post-op Pain Management:    Induction: Intravenous  PONV Risk Score and Plan: 2 and Ondansetron , Dexamethasone  and Treatment may vary due to age or medical condition  Airway Management Planned: Oral ETT  Additional Equipment:   Intra-op Plan:   Post-operative Plan: Extubation in OR  Informed Consent: I have reviewed the patients History and Physical, chart, labs and discussed the procedure including the risks, benefits and alternatives for the proposed anesthesia with the patient or authorized representative who has indicated his/her understanding and acceptance.     Dental advisory given  Plan Discussed with: CRNA  Anesthesia Plan Comments:          Anesthesia Quick Evaluation

## 2024-09-09 NOTE — Progress Notes (Signed)
 Patient walked to the bathroom without difficulties. Bilateral groins level 0, clean, dry, and intact,

## 2024-09-09 NOTE — Transfer of Care (Signed)
 Immediate Anesthesia Transfer of Care Note  Patient: Alex Maddox  Procedure(s) Performed: ATRIAL FIBRILLATION ABLATION  Patient Location: PACU  Anesthesia Type:General  Level of Consciousness: drowsy and patient cooperative  Airway & Oxygen Therapy: Patient connected to nasal cannula oxygen  Post-op Assessment: Report given to RN, Post -op Vital signs reviewed and stable, and Patient moving all extremities X 4  Post vital signs: Reviewed and stable  Last Vitals:  Vitals Value Taken Time  BP 133/83 09/09/24 09:30  Temp 36.5 C 09/09/24 09:31  Pulse 60 09/09/24 09:34  Resp 16 09/09/24 09:34  SpO2 97 % 09/09/24 09:34  Vitals shown include unfiled device data.  Last Pain:  Vitals:   09/09/24 0931  TempSrc: Oral  PainSc: 0-No pain         Complications: There were no known notable events for this encounter.

## 2024-09-10 ENCOUNTER — Telehealth (HOSPITAL_COMMUNITY): Payer: Self-pay

## 2024-09-10 ENCOUNTER — Encounter (HOSPITAL_COMMUNITY): Payer: Self-pay | Admitting: Cardiovascular Disease

## 2024-09-10 NOTE — Telephone Encounter (Signed)
 Spoke with patient to complete post procedure follow up call.  Patient reports no complications with groin sites.   Instructions reviewed with patient:  Remove large bandage at puncture site after 24 hours. It is normal to have bruising, tenderness, mild swelling, and a pea or marble sized lump/knot at the groin site which can take up to three months to resolve.  Get help right away if you notice sudden swelling at the puncture site.  Check your puncture site every day for signs of infection: fever, redness, swelling, pus drainage, warmth, foul odor or excessive pain. If this occurs, please call [phone], to speak with the RN Navigator. Get help right away if your puncture site is bleeding and the bleeding does not stop after applying firm pressure to the area.  You may continue to have skipped beats/ atrial fibrillation during the first several months after your procedure.  It is very important not to miss any doses of your blood thinner Warfarin.    You will follow up with the Afib clinic 4 weeks after your procedure and follow up with Dr. Nancey 3 months after your procedure.  Activity restrictions reviewed.  Patient verbalized understanding to all instructions provided.

## 2024-09-11 MED FILL — Fentanyl Citrate Preservative Free (PF) Inj 100 MCG/2ML: INTRAMUSCULAR | Qty: 2 | Status: AC

## 2024-09-11 MED FILL — Cefazolin Sodium-Dextrose IV Solution 2 GM/100ML-4%: INTRAVENOUS | Qty: 100 | Status: AC

## 2024-09-11 MED FILL — Cefazolin Sodium-Dextrose IV Solution 2 GM/100ML-4%: INTRAVENOUS | Qty: 100 | Status: CN

## 2024-09-11 MED FILL — Fentanyl Citrate Preservative Free (PF) Inj 100 MCG/2ML: INTRAMUSCULAR | Qty: 2 | Status: CN

## 2024-09-16 ENCOUNTER — Ambulatory Visit

## 2024-09-16 ENCOUNTER — Ambulatory Visit: Attending: Cardiovascular Disease | Admitting: *Deleted

## 2024-09-16 DIAGNOSIS — I4891 Unspecified atrial fibrillation: Secondary | ICD-10-CM | POA: Diagnosis not present

## 2024-09-16 DIAGNOSIS — Z5181 Encounter for therapeutic drug level monitoring: Secondary | ICD-10-CM | POA: Diagnosis not present

## 2024-09-16 LAB — POCT INR: INR: 2.6 (ref 2.0–3.0)

## 2024-09-16 NOTE — Progress Notes (Signed)
 INR 2.6; Please see anticoagulation encounter

## 2024-09-16 NOTE — Patient Instructions (Signed)
 S/P AF Ablation 09/09/24  1st post ablation Continue warfarin 1 tablet daily except 1/2 tablet on Wednesdays. Recheck 1 week

## 2024-09-17 ENCOUNTER — Encounter: Payer: Self-pay | Admitting: Emergency Medicine

## 2024-09-23 ENCOUNTER — Ambulatory Visit

## 2024-09-26 ENCOUNTER — Ambulatory Visit: Attending: Cardiovascular Disease

## 2024-09-26 DIAGNOSIS — Z5181 Encounter for therapeutic drug level monitoring: Secondary | ICD-10-CM

## 2024-09-26 DIAGNOSIS — I495 Sick sinus syndrome: Secondary | ICD-10-CM

## 2024-09-26 DIAGNOSIS — I4891 Unspecified atrial fibrillation: Secondary | ICD-10-CM

## 2024-09-26 LAB — POCT INR: INR: 2.7 (ref 2.0–3.0)

## 2024-09-26 NOTE — Progress Notes (Signed)
 INR 2.7; Please see anticoagulation encounter

## 2024-09-26 NOTE — Patient Instructions (Signed)
 S/P AF Ablation 09/09/24  2nd post ablation Continue warfarin 1 tablet daily except 1/2 tablet on Wednesdays. Recheck 10 days

## 2024-09-27 LAB — CUP PACEART REMOTE DEVICE CHECK
Battery Remaining Longevity: 125 mo
Battery Voltage: 3.01 V
Brady Statistic AP VP Percent: 0.02 %
Brady Statistic AP VS Percent: 50.67 %
Brady Statistic AS VP Percent: 0.02 %
Brady Statistic AS VS Percent: 49.3 %
Brady Statistic RA Percent Paced: 50.7 %
Brady Statistic RV Percent Paced: 0.04 %
Date Time Interrogation Session: 20251217205120
Implantable Lead Connection Status: 753985
Implantable Lead Connection Status: 753985
Implantable Lead Implant Date: 20120820
Implantable Lead Implant Date: 20120820
Implantable Lead Location: 753859
Implantable Lead Location: 753860
Implantable Pulse Generator Implant Date: 20220323
Lead Channel Impedance Value: 361 Ohm
Lead Channel Impedance Value: 399 Ohm
Lead Channel Impedance Value: 627 Ohm
Lead Channel Impedance Value: 703 Ohm
Lead Channel Pacing Threshold Amplitude: 0.75 V
Lead Channel Pacing Threshold Amplitude: 1.25 V
Lead Channel Pacing Threshold Pulse Width: 0.4 ms
Lead Channel Pacing Threshold Pulse Width: 0.4 ms
Lead Channel Sensing Intrinsic Amplitude: 5.375 mV
Lead Channel Sensing Intrinsic Amplitude: 5.375 mV
Lead Channel Sensing Intrinsic Amplitude: 6.625 mV
Lead Channel Sensing Intrinsic Amplitude: 6.625 mV
Lead Channel Setting Pacing Amplitude: 1.5 V
Lead Channel Setting Pacing Amplitude: 2.5 V
Lead Channel Setting Pacing Pulse Width: 0.4 ms
Lead Channel Setting Sensing Sensitivity: 0.9 mV
Zone Setting Status: 755011

## 2024-09-29 NOTE — Progress Notes (Signed)
 Remote PPM Transmission

## 2024-10-01 ENCOUNTER — Inpatient Hospital Stay: Attending: Oncology

## 2024-10-01 ENCOUNTER — Inpatient Hospital Stay

## 2024-10-01 DIAGNOSIS — D696 Thrombocytopenia, unspecified: Secondary | ICD-10-CM | POA: Diagnosis present

## 2024-10-01 LAB — CBC WITH DIFFERENTIAL/PLATELET
Abs Immature Granulocytes: 0.01 K/uL (ref 0.00–0.07)
Basophils Absolute: 0 K/uL (ref 0.0–0.1)
Basophils Relative: 0 %
Eosinophils Absolute: 0.2 K/uL (ref 0.0–0.5)
Eosinophils Relative: 4 %
HCT: 44.5 % (ref 39.0–52.0)
Hemoglobin: 15 g/dL (ref 13.0–17.0)
Immature Granulocytes: 0 %
Lymphocytes Relative: 18 %
Lymphs Abs: 1 K/uL (ref 0.7–4.0)
MCH: 32 pg (ref 26.0–34.0)
MCHC: 33.7 g/dL (ref 30.0–36.0)
MCV: 94.9 fL (ref 80.0–100.0)
Monocytes Absolute: 0.5 K/uL (ref 0.1–1.0)
Monocytes Relative: 9 %
Neutro Abs: 4.1 K/uL (ref 1.7–7.7)
Neutrophils Relative %: 69 %
Platelets: 114 K/uL — ABNORMAL LOW (ref 150–400)
RBC: 4.69 MIL/uL (ref 4.22–5.81)
RDW: 12.6 % (ref 11.5–15.5)
WBC: 5.9 K/uL (ref 4.0–10.5)
nRBC: 0 % (ref 0.0–0.2)

## 2024-10-01 LAB — COMPREHENSIVE METABOLIC PANEL WITH GFR
ALT: 40 U/L (ref 0–44)
AST: 28 U/L (ref 15–41)
Albumin: 4 g/dL (ref 3.5–5.0)
Alkaline Phosphatase: 71 U/L (ref 38–126)
Anion gap: 10 (ref 5–15)
BUN: 17 mg/dL (ref 8–23)
CO2: 26 mmol/L (ref 22–32)
Calcium: 9.7 mg/dL (ref 8.9–10.3)
Chloride: 105 mmol/L (ref 98–111)
Creatinine, Ser: 1.12 mg/dL (ref 0.61–1.24)
GFR, Estimated: >60 mL/min
Glucose, Bld: 129 mg/dL — ABNORMAL HIGH (ref 70–99)
Potassium: 4.5 mmol/L (ref 3.5–5.1)
Sodium: 141 mmol/L (ref 135–145)
Total Bilirubin: 0.5 mg/dL (ref 0.0–1.2)
Total Protein: 6.5 g/dL (ref 6.5–8.1)

## 2024-10-05 ENCOUNTER — Ambulatory Visit: Payer: Self-pay | Admitting: Cardiovascular Disease

## 2024-10-07 ENCOUNTER — Encounter: Payer: Self-pay | Admitting: *Deleted

## 2024-10-07 ENCOUNTER — Ambulatory Visit: Admitting: *Deleted

## 2024-10-07 DIAGNOSIS — Z5181 Encounter for therapeutic drug level monitoring: Secondary | ICD-10-CM

## 2024-10-07 DIAGNOSIS — I4891 Unspecified atrial fibrillation: Secondary | ICD-10-CM | POA: Diagnosis not present

## 2024-10-07 LAB — POCT INR: INR: 2.8 (ref 2.0–3.0)

## 2024-10-07 NOTE — Progress Notes (Signed)
 INR-2.8 Please see anticoagulation encounter

## 2024-10-07 NOTE — Patient Instructions (Signed)
 S/P AF Ablation 09/09/24  3red post ablation Continue warfarin 1 tablet daily except 1/2 tablet on Wednesdays. Recheck 3wks

## 2024-10-08 ENCOUNTER — Inpatient Hospital Stay (HOSPITAL_BASED_OUTPATIENT_CLINIC_OR_DEPARTMENT_OTHER): Admitting: Oncology

## 2024-10-08 VITALS — BP 134/79 | HR 60 | Temp 97.5°F | Resp 18 | Wt 196.2 lb

## 2024-10-08 DIAGNOSIS — D696 Thrombocytopenia, unspecified: Secondary | ICD-10-CM

## 2024-10-08 NOTE — Progress Notes (Signed)
 "  [facility] OFFICE PROGRESS NOTE  Shona Norleen PEDLAR, MD  ASSESSMENT & PLAN:  Assessment & Plan Thrombocytopenia - Hematology workup from 04/26/2024 showed no evidence of nutritional deficiencies with normal copper  and MMA or infection with nonreactive hep B and hep C results.  No evidence of connective tissue disorders with normal ANA and rheumatoid factor.  No evidence of underlying MDS with normal protein electrophoresis and immunofixation.  Reticulocyte count is also normal.  Ultrasound of the spleen is within normal limits. 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 -Recommend active surveillance every 6 months. -Labs from 10/01/2024 show a platelet count of 114,000. -Discussed continuing to monitor and if platelets drop below 50,000 would consider treatment with steroids.   Orders Placed This Encounter  Procedures   Comprehensive metabolic panel    Standing Status:   Future    Expected Date:   04/08/2025    Expiration Date:   07/07/2025   CBC with Differential    Standing Status:   Future    Expected Date:   04/08/2025    Expiration Date:   07/07/2025    INTERVAL HISTORY: Patient returns for recurrent thrombocytopenia.  He denies any episodes of bleeding, melena or hematochezia.  He is currently on Coumadin  and gets his PT/INR checked every few weeks.    Reports he had a ablation for history of atrial fibrillation on 09/09/2024 with good tolerance.  Reports he still thinks he goes in and out of atrial fibrillation but overall it has helped.  Patient had a peroneal nerve decompression on 05/21/2024 for left peroneal neuropathy by Toribio Jama Goods, PA.   Denies any melena, hematochezia or bright red blood per rectum.  Reports numbness in his left foot.  Appetite and energy levels are 100%.  We reviewed his CBC and CMP.  SUMMARY OF HEMATOLOGIC HISTORY: Patient is a 73 year old male with past medical history significant for hypertension, AAA, atrial  fibrillation (on Coumadin ), pacemaker, sleep apnea, stroke with history of colon polyps who is followed by hematology for thrombocytopenia.  He was last seen by hematology on 11/02/2023.  Most recent colonoscopy from showed nonbleeding internal hemorrhoids, 3 to 6 mm polyps in ascending colon.  Pathology negative for dysplasia.  No results found for: CBC  Vitals:   10/08/24 1142 10/08/24 1153  BP: (!) 157/84 134/79  Pulse: 60   Resp: 18   Temp: (!) 97.5 F (36.4 C)   SpO2: 99%     Review of Systems  Constitutional:  Positive for malaise/fatigue.  Cardiovascular:  Positive for leg swelling.  Gastrointestinal:  Negative for blood in stool.  Neurological:  Positive for tingling (L foot).   Physical Exam Constitutional:      Appearance: Normal appearance.  Cardiovascular:     Rate and Rhythm: Normal rate and regular rhythm.  Pulmonary:     Effort: Pulmonary effort is normal.     Breath sounds: Normal breath sounds.  Abdominal:     General: Bowel sounds are normal.     Palpations: Abdomen is soft.  Musculoskeletal:        General: No swelling. Normal range of motion.  Neurological:     Mental Status: He is alert and oriented to person, place, and time. Mental status is at baseline.     I spent 25 minutes dedicated to the care of this patient (face-to-face and non-face-to-face) on the date of the encounter to include what is described in the assessment and plan.  Delon Hope, NP  10/08/2024 2:15 PM "

## 2024-10-08 NOTE — Assessment & Plan Note (Addendum)
-   Hematology workup from 04/26/2024 showed no evidence of nutritional deficiencies with normal copper  and MMA or infection with nonreactive hep B and hep C results.  No evidence of connective tissue disorders with normal ANA and rheumatoid factor.  No evidence of underlying MDS with normal protein electrophoresis and immunofixation.  Reticulocyte count is also normal.  Ultrasound of the spleen is within normal limits. 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 -Recommend active surveillance every 6 months. -Labs from 10/01/2024 show a platelet count of 114,000. -Discussed continuing to monitor and if platelets drop below 50,000 would consider treatment with steroids.

## 2024-10-09 ENCOUNTER — Ambulatory Visit (HOSPITAL_COMMUNITY)
Admit: 2024-10-09 | Discharge: 2024-10-09 | Disposition: A | Discharge status: Home / Self Care | Attending: Physician Assistant | Admitting: Physician Assistant

## 2024-10-09 VITALS — BP 122/76 | HR 60 | Ht 72.0 in | Wt 195.4 lb

## 2024-10-09 DIAGNOSIS — D6869 Other thrombophilia: Secondary | ICD-10-CM

## 2024-10-09 DIAGNOSIS — I4891 Unspecified atrial fibrillation: Secondary | ICD-10-CM

## 2024-10-09 DIAGNOSIS — Z5181 Encounter for therapeutic drug level monitoring: Secondary | ICD-10-CM | POA: Diagnosis not present

## 2024-10-09 DIAGNOSIS — I480 Paroxysmal atrial fibrillation: Secondary | ICD-10-CM

## 2024-10-09 DIAGNOSIS — Z79899 Other long term (current) drug therapy: Secondary | ICD-10-CM | POA: Diagnosis not present

## 2024-10-09 NOTE — Progress Notes (Signed)
 "   Primary Care Physician: Shona Norleen PEDLAR, MD Primary Cardiologist: Dr Francyne Primary Electrophysiologist: none Referring Physician: Dr Francyne Dickens Alex Maddox is a 73 y.o. male with a history of OSA, coronary calcifications, CHF, thoracic and abdominal aortic aneurysm, neurally mediated syncope, tachycardia-bradycardia syndrome s/p pacemaker, atrial fibrillation, atrial flutter who presents for follow up in the [facility]. Patient is on warfarin for stroke prevention. He was seen by Dr Francyne 01/21/22 in rapid atrial flutter. Overdrive pacing with his device converted him to rate controlled afib. Patient called [facility] with heart rates back up to 140s with some SOB and lower extremity edema. He is s/p dofetilide  loading 01/2022. He had an increase in afib burden and underwent afib and flutter ablation on 09/09/24 with Dr Nancey.   Patient returns for follow up for atrial fibrillation and dofetilide  monitoring. He remains in SR today and feels well. His PPM shows no interim afib since ablation. His groin sites are well healed. He denies any bleeding issues on anticoagulation.   Today, he  denies symptoms of palpitations, chest pain, shortness of breath, orthopnea, PND, lower extremity edema, dizziness, presyncope, syncope, bleeding, or neurologic sequela. The patient is tolerating medications without difficulties and is otherwise without complaint today.    Atrial Fibrillation Risk Factors:  he does have symptoms or diagnosis of sleep apnea. he does not have a history of rheumatic fever.   Atrial Fibrillation Management history:  Previous antiarrhythmic drugs: dofetilide   Previous cardioversions: none Previous ablations: 09/09/24 Anticoagulation history: warfarin    Past Medical History:  Diagnosis Date   Aortic atherosclerosis    Arthritis of spine    Ascending aortic aneurysm    BPH (benign prostatic hyperplasia)    CAD (coronary artery disease)     Cerebral microvascular disease    CKD (chronic kidney disease), stage III (HCC)    Combined systolic and diastolic congestive heart failure (HCC)    DDD (degenerative disc disease), cervical    a.) s/p C5-C6 ACDF   DDD (degenerative disc disease), lumbar    ED (erectile dysfunction)    History of loop recorder (has been removed)    History of multiple strokes    Hyperlipidemia    Hypertension    Infrarenal abdominal aortic aneurysm (AAA) without rupture    Intracranial hemorrhage (HCC)    Neurocardiogenic syncope    last syncopal episode was 01-2024   Paroxysmal atrial fibrillation (HCC)    a.) CHA2DS2VASc = 6 (age, CHF, HTN, CVA/TIA x 2, vascular disease) as of 05/20/2024; b.) rate/rhythm maintained on oral dofetilide  + carvedilol ; chronically anticoagulated with warfarin   Penetrating atherosclerotic ulcer of aorta    Peroneal neuropathy, left    Peroneal neuropathy, left    Pre-diabetes    Presence of permanent cardiac pacemaker 05/31/2011   a.) MDT Revo placed 05/31/2011 --> ERI 09/17/2021; b.) PPM generator changed 12/30/2020 --> Medtronic Azure XT DR [id number] (SN: [id number])   RBBB (right bundle branch block)    Sepsis (HCC)    Sinus node dysfunction (HCC)    Sleep apnea, obstructive    cannot tolerate cpap-sleep study 10/09/2007  AHI 5.73/hr  REM AHI 11.90/hr   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   LEFT THUMB    Tachycardia-bradycardia (HCC)    a.) s/p PPM 05/31/2011   Thrombocytopenia    Transient ischemic attack (TIA) 03/2006   Warfarin anticoagulation     Current Outpatient Medications  Medication Sig Dispense Refill  carvedilol  (COREG ) 25 MG tablet Take 2 tablets (50 mg total) by mouth 2 (two) times daily with a meal. 360 tablet 3   Cyanocobalamin  (B-12 PO) Take 1,000 mcg by mouth daily.     diazepam (VALIUM) 2 MG tablet Take 2 mg by mouth every 8 (eight) hours as needed for anxiety.      dofetilide  (TIKOSYN ) 250 MCG capsule TAKE ONE (1) CAPSULE BY MOUTH  TWICE A DAY. (EVERY 12 HOURS.) 60 capsule 6   finasteride  (PROSCAR ) 5 MG tablet Take 5 mg by mouth every morning.     furosemide  (LASIX ) 20 MG tablet Take 1 tablet (20 mg total) by mouth daily as needed for fluid or edema (20 MG daily as needed for swelling- take potassium when you take this). 90 tablet 3   irbesartan  (AVAPRO ) 75 MG tablet Take 1 tablet (75 mg total) by mouth daily. 90 tablet 3   magnesium  gluconate (MAGONATE) 500 (27 Mg) MG TABS tablet Take 500 mg by mouth daily.     Multiple Vitamin (MULTIVITAMIN WITH MINERALS) TABS tablet Take 1 tablet by mouth daily. Adult Multivitamin 50+     OVER THE COUNTER MEDICATION Take 1 tablet by mouth at bedtime. Equate Brain Health Supplement     Polyethyl Glycol-Propyl Glycol 0.4-0.3 % SOLN Place 1 drop into both eyes 3 (three) times daily as needed (for dry eyes.).     potassium chloride  (KLOR-CON ) 10 MEQ tablet Take 1 tablet (10 mEq total) by mouth 3 (three) times daily. (Patient taking differently: Take 10 mEq by mouth 3 (three) times daily as needed (When taking Furosemide ).) 180 tablet 3   rosuvastatin  (CRESTOR ) 20 MG tablet TAKE ONE TABLET (20MG  TOTAL) BY MOUTH DAILY 90 tablet 2   Saw Palmetto 450 MG CAPS Take 900 mg by mouth daily.     trospium (SANCTURA) 20 MG tablet Take 20 mg by mouth at bedtime.     warfarin (COUMADIN ) 5 MG tablet TAKE ONE-HALF TO ONE TABLET BY MOUTH EVERY DAY AS DIRECTED BY COUMADIN  CLINIC (Patient taking differently: Take 2.5-5 mg by mouth See admin instructions. Take 5 mg every day EXCEPT: Wednesday take 2.5 mg  AS DIRECTED BY COUMADIN  CLINIC) 90 tablet 1   No current facility-administered medications for this encounter.    ROS- All systems are reviewed and negative except as per the HPI above.  Physical Exam: Vitals:   10/09/24 1124  Height: 6' (1.829 m)    GEN: Well nourished, well developed in no acute distress CARDIAC: Regular rate and rhythm, no murmurs, rubs, gallops RESPIRATORY:  Clear to auscultation  without rales, wheezing or rhonchi  ABDOMEN: Soft, non-tender, non-distended EXTREMITIES:  No edema; No deformity    Wt Readings from Last 3 Encounters:  10/08/24 89 kg  09/09/24 86.2 kg  09/04/24 88.9 kg    EKG Interpretation Date/Time:  Wednesday October 09 2024 11:38:40 EST Ventricular Rate:  60 PR Interval:  198 QRS Duration:  128 QT Interval:  488 QTC Calculation: 488 R Axis:   83  Text Interpretation: Atrial-paced rhythm Right bundle branch block Abnormal ECG When compared with ECG of 09-Sep-2024 09:39, Electronic atrial pacemaker has replaced Sinus rhythm Confirmed by Ankur Snowdon ([id number]) on 10/09/2024 11:51:32 AM    Echo 11/08/23  1. Left ventricular ejection fraction, by estimation, is 50 to 55%. The  left ventricle has low normal function. The left ventricle has no regional  wall motion abnormalities. Left ventricular diastolic parameters were  normal. The average left ventricular global longitudinal strain  is -11.6 %. The global longitudinal strain is abnormal.   2. Right ventricular systolic function is normal. The right ventricular  size is normal. There is normal pulmonary artery systolic pressure.   3. The mitral valve is degenerative. No evidence of mitral valve  regurgitation. No evidence of mitral stenosis.   4. The aortic valve is tricuspid. Aortic valve regurgitation is trivial.  Aortic valve sclerosis is present, with no evidence of aortic valve  stenosis.   5. Aortic dilatation noted. There is dilatation of the ascending aorta,  measuring 41 mm.   6. The inferior vena cava is normal in size with greater than 50%  respiratory variability, suggesting right atrial pressure of 3 mmHg.   Comparison(s): A prior study was performed on 01/25/2022. LVEF 45%, global hypokinesis, indeterminate diastolic dysfunction, average HOD-88.0%, RV mildly reduced and mildly enlarged, mild MR, moderate TR, ascending Ao 40mm, estimated RAP .       Epic records are  reviewed at length today   CHA2DS2-VASc Score = 6  The patient's score is based upon: CHF History: 1 HTN History: 1 Diabetes History: 0 Stroke History: 2 Vascular Disease History: 1 Age Score: 1 Gender Score: 0       ASSESSMENT AND PLAN: Paroxysmal Atrial Fibrillation/atrial flutter (ICD10:  I48.0) The patient's CHA2DS2-VASc score is 6, indicating a 9.7% annual risk of stroke.   S/p dofetilide  loading 01/2022 S/p afib and flutter ablation 09/09/24 Patient maintaining SR Continue dofetilide  250 mcg BID for now. Continue warfarin Continue carvedilol  50 mg BID  Secondary Hypercoagulable State (ICD10:  D68.69) The patient is at significant risk for stroke/thromboembolism based upon his CHA2DS2-VASc Score of 6.  Continue Warfarin (Coumadin ). No bleeding issues.   High Risk Medication Monitoring (ICD 10: [id number]) Patient requires ongoing monitoring for anti-arrhythmic medication which has the potential to cause life threatening arrhythmias. QT interval on ECG acceptable for dofetilide  monitoring.   Tachybradycardia syndrome S/p PPM, followed by Dr Francyne  [facility]  Encouraged nightly CPAP Will request visit to establish care with Dr Shlomo  HFrecEF EF 50-55% GDMT per primary cardiology team Fluid status appears stable today  HTN Stable on current regimen   Follow up with Dr Nancey as scheduled.    Daril Kicks PA-C [facility] [address] [place], [place] [address] [phone]  "

## 2024-10-18 ENCOUNTER — Telehealth: Payer: Self-pay

## 2024-10-18 NOTE — Telephone Encounter (Signed)
 Alex Maddox

## 2024-10-20 NOTE — Progress Notes (Unsigned)
 m

## 2024-10-21 ENCOUNTER — Ambulatory Visit: Admitting: Cardiology

## 2024-10-21 ENCOUNTER — Ambulatory Visit: Attending: Cardiology | Admitting: Cardiology

## 2024-10-21 VITALS — BP 138/65 | HR 59 | Ht 72.0 in | Wt 192.4 lb

## 2024-10-21 DIAGNOSIS — I10 Essential (primary) hypertension: Secondary | ICD-10-CM

## 2024-10-21 DIAGNOSIS — G4733 Obstructive sleep apnea (adult) (pediatric): Secondary | ICD-10-CM | POA: Diagnosis not present

## 2024-10-21 NOTE — Patient Instructions (Signed)
 Medication Instructions:  Your physician recommends that you continue on your current medications as directed. Please refer to the Current Medication list given to you today.  *If you need a refill on your cardiac medications before your next appointment, please call your pharmacy*  Lab Work: None.  If you have labs (blood work) drawn today and your tests are completely normal, you will receive your results only by: MyChart Message (if you have MyChart) OR A paper copy in the mail If you have any lab test that is abnormal or we need to change your treatment, we will call you to review the results.  Testing/Procedures: None.  Follow-Up: At [facility], you and your health needs are our priority.  As part of our continuing mission to provide you with exceptional heart care, our providers are all part of one team.  This team includes your primary Cardiologist (physician) and Advanced Practice Providers or APPs (Physician Assistants and Nurse Practitioners) who all work together to provide you with the care you need, when you need it.  Your next appointment:   1 year(s)  Provider:   Dr. Wilbert Bihari, MD   We recommend signing up for the patient portal called MyChart.  Sign up information is provided on this After Visit Summary.  MyChart is used to connect with patients for Virtual Visits (Telemedicine).  Patients are able to view lab/test results, encounter notes, upcoming appointments, etc.  Non-urgent messages can be sent to your provider as well.   To learn more about what you can do with MyChart, go to [email or web address].   Other Instructions Dr. Bihari has ordered new supplies for you. Please call our office if you do not receive any information from your DME company in 2 weeks.   Please remember to change out your cushions every 4 weeks.

## 2024-10-25 ENCOUNTER — Telehealth: Payer: Self-pay | Admitting: *Deleted

## 2024-10-25 ENCOUNTER — Ambulatory Visit: Admitting: Cardiology

## 2024-10-25 DIAGNOSIS — I480 Paroxysmal atrial fibrillation: Secondary | ICD-10-CM

## 2024-10-25 DIAGNOSIS — G4733 Obstructive sleep apnea (adult) (pediatric): Secondary | ICD-10-CM

## 2024-10-25 DIAGNOSIS — I4891 Unspecified atrial fibrillation: Secondary | ICD-10-CM

## 2024-10-25 DIAGNOSIS — I10 Essential (primary) hypertension: Secondary | ICD-10-CM

## 2024-10-25 NOTE — Telephone Encounter (Signed)
-----   Message from Wilbert Bihari, MD sent at 10/21/2024  8:15 AM EST ----- Patient uses [employer] but insurance no longer takes them so he needs a new DME.  Please order new supplies

## 2024-10-25 NOTE — Telephone Encounter (Addendum)
 DME selection is [facility]. Patient understands he will be contacted by [facility] to set up his cpap. Patient understands to call if [facility] does not contact him with new setup in a timely manner. Patient understands they will be called once confirmation has been received from [employer] that they have received their new machine to schedule 10 week follow up appointment.   [facility] notified of new cpap order  Please add to airview Patient was grateful for the call and thanked me.  Supply order sent. Patient notified.

## 2024-10-28 ENCOUNTER — Ambulatory Visit: Attending: Cardiovascular Disease | Admitting: *Deleted

## 2024-10-28 DIAGNOSIS — I4891 Unspecified atrial fibrillation: Secondary | ICD-10-CM | POA: Diagnosis not present

## 2024-10-28 DIAGNOSIS — Z5181 Encounter for therapeutic drug level monitoring: Secondary | ICD-10-CM

## 2024-10-28 LAB — POCT INR: INR: 2.5 (ref 2.0–3.0)

## 2024-10-28 NOTE — Patient Instructions (Signed)
 S/P AF Ablation 09/09/24   Continue warfarin 1 tablet daily except 1/2 tablet on Wednesdays. Recheck 4 wks

## 2024-10-28 NOTE — Progress Notes (Signed)
 INR 2.5

## 2024-10-30 NOTE — Telephone Encounter (Signed)
 Wife called back to say that she spoke with [facility] but they stated that she needs to order through [place]. She called them but they said they need a order for the cpap. Fax #[phone]. Please advise

## 2024-10-30 NOTE — Addendum Note (Signed)
 Addended by: JOSHUA BRAD MATSU on: 10/30/2024 03:53 PM   Modules accepted: Orders

## 2024-10-30 NOTE — Telephone Encounter (Addendum)
 Cpap order and clinical notes placed to [facility] at [phone].

## 2024-11-01 NOTE — Telephone Encounter (Signed)
 Called patient and left message that I sent the order already to [facility].

## 2024-11-25 ENCOUNTER — Ambulatory Visit

## 2024-12-13 ENCOUNTER — Ambulatory Visit: Admitting: Cardiovascular Disease

## 2025-02-07 ENCOUNTER — Ambulatory Visit (HOSPITAL_COMMUNITY)

## 2025-04-01 ENCOUNTER — Inpatient Hospital Stay

## 2025-04-08 ENCOUNTER — Inpatient Hospital Stay: Admitting: Oncology
